# Patient Record
Sex: Male | Born: 1935 | Race: White | Hispanic: No | Smoking: Former smoker
Health system: Southern US, Community
[De-identification: ages and names within clinical notes are randomized; demographics above are authoritative.]

## PROBLEM LIST (undated history)

## (undated) DIAGNOSIS — I509 Heart failure, unspecified: Secondary | ICD-10-CM

## (undated) DIAGNOSIS — N400 Enlarged prostate without lower urinary tract symptoms: Secondary | ICD-10-CM

## (undated) DIAGNOSIS — M1990 Unspecified osteoarthritis, unspecified site: Secondary | ICD-10-CM

## (undated) DIAGNOSIS — F419 Anxiety disorder, unspecified: Secondary | ICD-10-CM

## (undated) DIAGNOSIS — I639 Cerebral infarction, unspecified: Secondary | ICD-10-CM

## (undated) DIAGNOSIS — I10 Essential (primary) hypertension: Secondary | ICD-10-CM

## (undated) DIAGNOSIS — R001 Bradycardia, unspecified: Secondary | ICD-10-CM

## (undated) DIAGNOSIS — I5042 Chronic combined systolic (congestive) and diastolic (congestive) heart failure: Secondary | ICD-10-CM

## (undated) DIAGNOSIS — K219 Gastro-esophageal reflux disease without esophagitis: Secondary | ICD-10-CM

## (undated) HISTORY — DX: Cerebral infarction, unspecified: I63.9

## (undated) HISTORY — DX: Essential (primary) hypertension: I10

## (undated) HISTORY — DX: Anxiety disorder, unspecified: F41.9

## (undated) HISTORY — DX: Chronic combined systolic (congestive) and diastolic (congestive) heart failure: I50.42

## (undated) HISTORY — DX: Benign prostatic hyperplasia without lower urinary tract symptoms: N40.0

---

## 2002-10-13 ENCOUNTER — Ambulatory Visit (HOSPITAL_COMMUNITY): Admission: RE | Admit: 2002-10-13 | Discharge: 2002-10-13 | Payer: Self-pay | Admitting: Gastroenterology

## 2004-12-25 HISTORY — PX: CARDIAC CATHETERIZATION: SHX172

## 2005-01-20 ENCOUNTER — Inpatient Hospital Stay (HOSPITAL_BASED_OUTPATIENT_CLINIC_OR_DEPARTMENT_OTHER): Admission: RE | Admit: 2005-01-20 | Discharge: 2005-01-20 | Payer: Self-pay | Admitting: Cardiology

## 2007-04-22 ENCOUNTER — Emergency Department (HOSPITAL_COMMUNITY): Admission: EM | Admit: 2007-04-22 | Discharge: 2007-04-22 | Payer: Self-pay | Admitting: Emergency Medicine

## 2009-11-02 ENCOUNTER — Ambulatory Visit: Payer: Self-pay | Admitting: Family Medicine

## 2009-11-02 DIAGNOSIS — J019 Acute sinusitis, unspecified: Secondary | ICD-10-CM

## 2009-11-02 DIAGNOSIS — I10 Essential (primary) hypertension: Secondary | ICD-10-CM | POA: Insufficient documentation

## 2009-11-02 DIAGNOSIS — F411 Generalized anxiety disorder: Secondary | ICD-10-CM | POA: Insufficient documentation

## 2009-11-24 ENCOUNTER — Ambulatory Visit: Payer: Self-pay | Admitting: Family Medicine

## 2009-11-24 DIAGNOSIS — J020 Streptococcal pharyngitis: Secondary | ICD-10-CM

## 2010-02-23 ENCOUNTER — Telehealth: Payer: Self-pay | Admitting: Family Medicine

## 2010-07-12 ENCOUNTER — Ambulatory Visit: Payer: Self-pay | Admitting: Family Medicine

## 2010-07-12 DIAGNOSIS — N400 Enlarged prostate without lower urinary tract symptoms: Secondary | ICD-10-CM

## 2010-08-09 ENCOUNTER — Ambulatory Visit: Payer: Self-pay | Admitting: Family Medicine

## 2010-08-16 ENCOUNTER — Ambulatory Visit: Payer: Self-pay | Admitting: Family Medicine

## 2010-08-16 DIAGNOSIS — J309 Allergic rhinitis, unspecified: Secondary | ICD-10-CM | POA: Insufficient documentation

## 2011-01-21 ENCOUNTER — Encounter: Payer: Self-pay | Admitting: Family Medicine

## 2011-01-24 ENCOUNTER — Telehealth: Payer: Self-pay | Admitting: *Deleted

## 2011-01-26 NOTE — Assessment & Plan Note (Signed)
Summary: ?SINUS INF/NJR   Vital Signs:  Patient profile:   77 year old male Height:      68.25 inches Weight:      180 pounds BMI:     27.27 Temp:     97.8 degrees F oral BP sitting:   120 / 80  (left arm) Cuff size:   regular  Vitals Entered By: Kern Reap CMA Duncan Dull) (August 16, 2010 2:17 PM) CC: sinus infection   CC:  sinus infection.  History of Present Illness: Micheal Nicholson is an 77 year old male, nonsmoker, who, says he's had allergy problems.  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 who comes in today with a 3-day flair of his sneezing, head congestion, postnasal drip, and cough.  Review of systems negative  Allergies: No Known Drug Allergies  Past History:  Past medical, surgical, family and social histories (including risk factors) reviewed for relevance to current acute and chronic problems.  Past Medical History: Reviewed history from 08/09/2010 and no changes required. Hypertension Anxiety BPH  Past Surgical History: Reviewed history from 11/24/2009 and no changes required. cardiac catheterization 4 years ago, negative results no abnormalities found  Family History: Reviewed history from 11/02/2009 and no changes required. Father died PNA Mother died "old age" at 34  Social History: Reviewed history from 11/02/2009 and no changes required. 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 No apparent alopecia or balding. Eyes:  No corneal or conjunctival inflammation noted. EOMI. Perrla. Funduscopic exam benign, without hemorrhages, exudates or papilledema. Vision grossly normal. Ears:  External ear exam shows no significant lesions or deformities.  Otoscopic examination reveals  clear canals, tympanic membranes are intact bilaterally without bulging, retraction, inflammation or discharge. Hearing is grossly normal bilaterally. Nose:  septum deviated partially to the left.  3+ nasal edema Mouth:  Oral mucosa and oropharynx without lesions or exudates.  Teeth in good repair. Neck:  No deformities, masses, or tenderness noted. Chest Wall:  No deformities, masses, tenderness or gynecomastia noted. Lungs:  symmetrical breath sounds faint late expiratory wheezing   Problems:  Medical Problems Added: 1)  Dx of Rhinitis  (ICD-477.9)  Impression & Recommendations:  Problem # 1:  RHINITIS (ICD-477.9) Assessment New  The following medications were removed from the medication list:    Omnaris 50 Mcg/act Susp (Ciclesonide) .Marland Kitchen... 2 sprays each nostril once per day as needed  Orders: Prescription Created Electronically [phone])  Complete Medication List: 1)  Lotrel 5-20 Mg Caps (Amlodipine besy-benazepril hcl) .... Once daily 2)  Alprazolam 1 Mg Tabs (Alprazolam) .... One half to one tab b.i.d. 3)  Prednisone 20 Mg Tabs (Prednisone) .... Uad  Patient Instructions: 1)  begin prednisone, take one tablet x 3 days, a half x 3 days, then half a tablet Monday, Wednesday, Friday, for a two week taper.  When she is stopped the prednisone and then resume The plain Claritin 10 mg daily Prescriptions: PREDNISONE 20 MG TABS (PREDNISONE) UAD  #30 x 1   Entered and Authorized by:   Roderick Pee MD   Signed by:   Roderick Pee MD on 08/16/2010   Method used:   [facility] [address]  [address]       [place], [place]  [address]       Ph: [phone] or [phone]       Fax: [phone]   RxID:   [id number]

## 2011-01-26 NOTE — Assessment & Plan Note (Signed)
Summary: 1 month rov/nrj   Vital Signs:  Patient profile:   77 year old male Weight:      179 pounds Temp:     98.0 degrees F oral BP sitting:   130 / 78  (left arm) Cuff size:   regular  Vitals Entered By: Sid Falcon LPN (August 09, 2010 10:39 AM) CC: 1 month follow-up, Hypertension Management   History of Present Illness: Here for the following:  Recent BPH symptoms.  Pt improved greatly with Flomax.  No slow stream and frequency is  improved.  Less nocturia.  No burning with urination.  No side effects from med.  Had some nodularity of prostate on exam but he is not interested in further evaluation at this time given his age.  Hypertension treated with Lotrel.  Compliant with med and no side effects.  Long hx of anxiety.  Has been on alprazolam for years and doing well. No hx of misuse.  No ETOH use.  No depression symptoms. Steady on feet with no recent falls.   Hypertension History:      He denies headache, chest pain, palpitations, dyspnea with exertion, orthopnea, PND, peripheral edema, visual symptoms, neurologic problems, syncope, and side effects from treatment.        Positive major cardiovascular risk factors include male age 84 years old or older and hypertension.  Negative major cardiovascular risk factors include non-tobacco-user status.     Allergies (verified): No Known Drug Allergies  Past History:  Past Surgical History: Last updated: 11/24/2009 cardiac catheterization 4 years ago, negative results no abnormalities found  Family History: Last updated: 11-25-09 Father died PNA Mother died "old age" at 90  Social History: Last updated: 11/25/2009 Retired Never Smoked Alcohol use-no  Risk Factors: Smoking Status: never (2009-11-25)  Past Medical History: Hypertension Anxiety BPH PMH reviewed for relevance, SH/Risk Factors reviewed for relevance  Review of Systems  The patient denies anorexia, fever, weight loss, weight gain, chest  pain, syncope, dyspnea on exertion, peripheral edema, prolonged cough, headaches, hemoptysis, abdominal pain, melena, hematochezia, and severe indigestion/heartburn.    Physical Exam  General:  Well-developed,well-nourished,in no acute distress; alert,appropriate and cooperative throughout examination Head:  Normocephalic and atraumatic without obvious abnormalities. No apparent alopecia or balding. Mouth:  Oral mucosa and oropharynx without lesions or exudates.  Teeth in good repair. Neck:  No deformities, masses, or tenderness noted. Lungs:  Normal respiratory effort, chest expands symmetrically. Lungs are clear to auscultation, no crackles or wheezes. Heart:  Normal rate and regular rhythm. S1 and S2 normal without gallop, murmur, click, rub or other extra sounds. Abdomen:  Bowel sounds positive,abdomen soft and non-tender without masses, organomegaly or hernias noted. Extremities:  No clubbing, cyanosis, edema, or deformity noted with normal full range of motion of all joints.   Neurologic:  alert & oriented X3, cranial nerves II-XII intact, and gait normal.   Psych:  normally interactive, good eye contact, not anxious appearing, and not depressed appearing.     Impression & Recommendations:  Problem # 1:  ESSENTIAL HYPERTENSION (ICD-401.9) Assessment Unchanged  His updated medication list for this problem includes:    Lotrel 5-20 Mg Caps (Amlodipine besy-benazepril hcl) ..... Once daily  Problem # 2:  HYPERTROPHY PROSTATE W/UR OBST & OTH LUTS (ICD-600.01) Assessment: Improved Cont Flomax.  No orthostasis.  Problem # 3:  ANXIETY (ICD-300.00) Assessment: Unchanged  His updated medication list for this problem includes:    Alprazolam 1 Mg Tabs (Alprazolam) ..... One half to one tab b.i.d.  Orders: Prescription Created Electronically [id number])  Complete Medication List: 1)  Lotrel 5-20 Mg Caps (Amlodipine besy-benazepril hcl) .... Once daily 2)  Omnaris 50 Mcg/act Susp  (Ciclesonide) .... 2 sprays each nostril once per day as needed 3)  Alprazolam 1 Mg Tabs (Alprazolam) .... One half to one tab b.i.d. 4)  Tamsulosin Hcl 0.4 Mg Caps (Tamsulosin hcl) .... One by mouth at bedtime  Hypertension Assessment/Plan:      The patient's hypertensive risk group is category B: At least one risk factor (excluding diabetes) with no target organ damage.  Today's blood pressure is 130/78.    Patient Instructions: 1)  Please schedule a follow-up appointment in 6 months .  Prescriptions: ALPRAZOLAM 1 MG TABS (ALPRAZOLAM) one half to one tab b.i.d.  #60 x 5   Entered and Authorized by:   Evelena Peat MD   Signed by:   Evelena Peat MD on 08/09/2010   Method used:   Print then Give to Patient   RxID:   [id number]

## 2011-01-26 NOTE — Assessment & Plan Note (Signed)
Summary: Problems with prostate/cjr   Vital Signs:  Patient profile:   77 year old male Weight:      175 pounds Temp:     98.0 degrees F oral BP sitting:   140 / 100  (left arm) Cuff size:   regular  Vitals Entered By: Sid Falcon LPN (July 12, 2010 9:24 AM)  Serial Vital Signs/Assessments:  Time      Position  BP       Pulse  Resp  Temp     By                     132/78                         Evelena Peat MD  CC: Hypertension Management   History of Present Illness: Onset of urinary difficulties with slow stream for several months. No burning.  No signif nocturia.  Freq has to wait 30-40 sec for flow. No hx prostate cancer.  No appetite or weight changes. No anticholinergics.  On Lotrel for hypertension.  Compliant with therapy.    Hypertension History:      He denies headache, chest pain, palpitations, dyspnea with exertion, orthopnea, PND, peripheral edema, visual symptoms, neurologic problems, and syncope.        Positive major cardiovascular risk factors include male age 23 years old or older and hypertension.  Negative major cardiovascular risk factors include non-tobacco-user status.     Allergies (verified): No Known Drug Allergies  Past History:  Past Medical History: Last updated: 11/02/2009 Hypertension Anxiety PMH reviewed for relevance  Review of Systems  The patient denies anorexia, fever, weight loss, hoarseness, chest pain, syncope, dyspnea on exertion, peripheral edema, prolonged cough, abdominal pain, melena, hematochezia, and muscle weakness.    Physical Exam  General:  Well-developed,well-nourished,in no acute distress; alert,appropriate and cooperative throughout examination Mouth:  Oral mucosa and oropharynx without lesions or exudates.  Teeth in good repair. Neck:  No deformities, masses, or tenderness noted. Lungs:  Normal respiratory effort, chest expands symmetrically. Lungs are clear to auscultation, no crackles or  wheezes. Heart:  normal rate and regular rhythm.   Prostate:  mildly enlarged.  Does have some nodularity near the midline. Extremities:  no edema. Psych:  normally interactive, good eye contact, not anxious appearing, and not depressed appearing.     Impression & Recommendations:  Problem # 1:  HYPERTROPHY PROSTATE W/UR OBST & OTH LUTS (ICD-600.01) Assessment New discussed with pt options.  He has some BPH and could have some cancer based on exam but given his age we have not recomended further testing for prostate cancer and pt agrees.  Start Flomax for symptom control.  Problem # 2:  ESSENTIAL HYPERTENSION (ICD-401.9) Assessment: Unchanged  His updated medication list for this problem includes:    Lotrel 5-20 Mg Caps (Amlodipine besy-benazepril hcl) ..... Once daily  Complete Medication List: 1)  Lotrel 5-20 Mg Caps (Amlodipine besy-benazepril hcl) .... Once daily 2)  Omnaris 50 Mcg/act Susp (Ciclesonide) .... 2 sprays each nostril once per day as needed 3)  Alprazolam 1 Mg Tabs (Alprazolam) .... One half to one tab b.i.d. 4)  Tamsulosin Hcl 0.4 Mg Caps (Tamsulosin hcl) .... One by mouth at bedtime  Hypertension Assessment/Plan:      The patient's hypertensive risk group is category B: At least one risk factor (excluding diabetes) with no target organ damage.  Today's blood pressure is 140/100.  Patient Instructions: 1)  Please schedule a follow-up appointment in 1 month.  Prescriptions: LOTREL 5-20 MG CAPS (AMLODIPINE BESY-BENAZEPRIL HCL) once daily  #30 x 11   Entered and Authorized by:   Evelena Peat MD   Signed by:   Evelena Peat MD on 07/12/2010   Method used:   [facility]  [address]       [address]       [place], [place]  [address]       Ph: [phone] or [phone]       Fax: [phone]   RxID:   [id number] TAMSULOSIN HCL 0.4 MG CAPS (TAMSULOSIN HCL) one by mouth at bedtime  #30 x 11   Entered and Authorized by:    Evelena Peat MD   Signed by:   Evelena Peat MD on 07/12/2010   Method used:   [facility]  [address] [facility]       [address]       [place], [place]  [address]       Ph: [phone] or [phone]       Fax: [phone]   RxID:   [id number]

## 2011-01-26 NOTE — Progress Notes (Signed)
Summary: LMTCB 3/2,3/3, Amoxicillin refill request  Phone Note From Pharmacy   [facility]  [address] [facility] [place] Call For: Aubrionna Istre  Summary of Call: Pharmacy faxed request to refill Amoxicillin 875, one by mouth, two times a day for 10 days for sinusitis.   Pt received Rx at [facility] on 12/1 [facility] with Dr Scotty Court #30 with 1 RF. [facility]  I called pt home to gather additional information regarding current symptoms, no answer, LMTCB  Initial call taken by: Sid Falcon LPN,  February 23, 2010 4:28 PM  Follow-up for Phone Call        [facility] LPN  February 24, 1609 8:42 AM   Additional Follow-up for Phone Call Additional follow up Details #1::        Pt returned call..... Pt adv that he has been exp sinus problems... one nostril feels like it swells up and limits his breathing.... Pt denies any fever, cough, or congestion, only c/o stuffy nose / stopped up sinus'...... Pt adv he can be reached at [phone].  Additional Follow-up by: Debbra Riding,  February 24, 2010 10:02 AM    Additional Follow-up for Phone Call Additional follow up Details #2::    should be re-evaluated.    Follow-up by: Evelena Peat MD,  February 28, 2010 12:55 PM  Additional Follow-up for Phone Call Additional follow up Details #3:: Details for Additional Follow-up Action Taken: Phone Call Completed-----LMTCB to schedule appt with Dr. Caryl Never to f/u on current sxs.  Additional Follow-up by: Debbra Riding,  February 28, 2010 1:11 PM

## 2011-02-07 ENCOUNTER — Encounter: Payer: Self-pay | Admitting: Family Medicine

## 2011-02-07 ENCOUNTER — Ambulatory Visit (INDEPENDENT_AMBULATORY_CARE_PROVIDER_SITE_OTHER): Payer: Medicare PPO | Admitting: Family Medicine

## 2011-02-07 VITALS — BP 140/84 | HR 72 | Temp 98.3°F | Resp 12 | Ht 69.0 in | Wt 182.0 lb

## 2011-02-07 DIAGNOSIS — F419 Anxiety disorder, unspecified: Secondary | ICD-10-CM

## 2011-02-07 DIAGNOSIS — N401 Enlarged prostate with lower urinary tract symptoms: Secondary | ICD-10-CM

## 2011-02-07 DIAGNOSIS — I10 Essential (primary) hypertension: Secondary | ICD-10-CM

## 2011-02-07 DIAGNOSIS — F411 Generalized anxiety disorder: Secondary | ICD-10-CM

## 2011-02-07 DIAGNOSIS — N400 Enlarged prostate without lower urinary tract symptoms: Secondary | ICD-10-CM

## 2011-02-07 DIAGNOSIS — K219 Gastro-esophageal reflux disease without esophagitis: Secondary | ICD-10-CM | POA: Insufficient documentation

## 2011-02-07 MED ORDER — ALPRAZOLAM 1 MG PO TABS: 1.0000 mg | ORAL_TABLET | Freq: Two times a day (BID) | ORAL | Status: DC

## 2011-02-07 MED ORDER — OMEPRAZOLE 20 MG PO CPDR: 20.0000 mg | DELAYED_RELEASE_CAPSULE | Freq: Every day | ORAL | Status: DC

## 2011-02-07 MED ORDER — DUTASTERIDE-TAMSULOSIN HCL 0.5-0.4 MG PO CAPS: 1.0000 | ORAL_CAPSULE | Freq: Every day | ORAL | Status: DC

## 2011-02-07 NOTE — Progress Notes (Signed)
  Subjective:    Patient ID: Micheal Nicholson, male    DOB: 06-Mar-1920, 77 y.o.   MRN: [id number]  HPI  Patient seen for the following  History of BPH. Started on Flomax several months ago without any improvement. Symptoms seem to be worse at night. Mostly complains of slow urinary stream. No burning with urination. Patient has not been tried on Avodart or finasteride previously.    history of chronic rhinitis symptoms. They seem to correlate with increased GERD symptoms. Frequent almost daily heartburn symptoms midepigastrium to upper sternal region. Worse supine. Has tried over-the-counter antacids with minimal relief. No pain with swallowing. No dysphagia.    hypertension treated with Lotrel. Compliant with medication. Denies any dizziness or side effects.  Chronic history of anxiety. Has been on alprazolam for many years. No recent falls. Requesting refills. No alcohol use.   Review of Systems  patient denies any headaches, dizziness, shortness of breath, chest pain, abdominal pain, burning with urination, stool changes , depression , or any significant joint pains    Objective:   Physical Exam  patient is alert and in no distress  Oropharynx is clear  Eardrums normal  Neck no mass  Chest clear to auscultation  Heart regular rhythm and rate  Abdomen soft and nontender  Extremities no edema  Psychiatric patient alert oriented x3. Normal mood and affect       Assessment & Plan:   #1 BPH. Discontinue Flomax and start Jalyn  #2 hypertension stable continue current medication with refills given   #3 chronic anxiety refill of alprazolam for 6 months  #4 chronic rhinitis possibly exacerbated by GERD symptoms. Start omeprazole 20 mg daily and reassess 2 months

## 2011-04-11 ENCOUNTER — Ambulatory Visit (INDEPENDENT_AMBULATORY_CARE_PROVIDER_SITE_OTHER): Payer: Medicare PPO | Admitting: Family Medicine

## 2011-04-11 ENCOUNTER — Encounter: Payer: Self-pay | Admitting: Family Medicine

## 2011-04-11 DIAGNOSIS — N401 Enlarged prostate with lower urinary tract symptoms: Secondary | ICD-10-CM

## 2011-04-11 DIAGNOSIS — I10 Essential (primary) hypertension: Secondary | ICD-10-CM

## 2011-04-11 DIAGNOSIS — K219 Gastro-esophageal reflux disease without esophagitis: Secondary | ICD-10-CM

## 2011-04-11 DIAGNOSIS — M25539 Pain in unspecified wrist: Secondary | ICD-10-CM

## 2011-04-11 NOTE — Progress Notes (Signed)
  Subjective:    Patient ID: Micheal Nicholson, male    DOB: 1920-04-29, 77 y.o.   MRN: [id number]  HPI Followup for chronic problems and new problem  BPH. Started [place] last visit. He had some decrease in nocturia and still has some slow stream but overall improving gradually. No major obstructive symptoms. No side effects from medication. Compliant with therapy.  Hypertension stable on Lotrel. No dizziness. Blood pressure been stable.  Recent GERD symptoms. Started omeprazole. Symptoms are fully resolved and he is currently off medication doing well.  He has made some diet modifications.  Left wrist pain for one to two-month duration. No injury. Frequently plays golf. Pain is mostly dorsal. No swelling. He thinks is arthritis related. No erythema or warmth. Not limiting activities   Review of Systems  Constitutional: Negative for fever, chills, activity change, appetite change and fatigue.  Respiratory: Negative for cough, shortness of breath and wheezing.   Cardiovascular: Negative for chest pain, palpitations and leg swelling.  Gastrointestinal: Negative for nausea, vomiting, abdominal pain, diarrhea, constipation, blood in stool and abdominal distention.  Genitourinary: Negative for dysuria.  Musculoskeletal: Negative for joint swelling.  Skin: Negative for rash.  Neurological: Negative for dizziness, syncope, weakness and headaches.  Hematological: Negative for adenopathy. Does not bruise/bleed easily.  Psychiatric/Behavioral: Negative for confusion.       Objective:   Physical Exam  Constitutional: He is oriented to person, place, and time. He appears well-developed and well-nourished.  HENT:  Mouth/Throat: Oropharynx is clear and moist. No oropharyngeal exudate.  Neck: No thyromegaly present.  Cardiovascular: Normal rate, regular rhythm and normal heart sounds.   No murmur heard. Pulmonary/Chest: Effort normal and breath sounds normal. No respiratory distress. He has no  wheezes. He has no rales.  Musculoskeletal: He exhibits no edema.       Left wrist - full range of motion. No warmth or erythema. No visible edema. No localized bony tenderness.  Lymphadenopathy:    He has no cervical adenopathy.  Neurological: He is alert and oriented to person, place, and time. No cranial nerve deficit.  Psychiatric: He has a normal mood and affect.          Assessment & Plan:  #1 BPH improved with medication. Continue with current therapy #2 hypertension stable.   continue current therapy  #3 GERD symptomatically improved off omeprazole #4 left wrist pain. Suspect osteoarthritis. He'll use brace as needed for symptom relief no longer than 2 weeks continuously. Tylenol for discomfort

## 2011-05-12 NOTE — H&P (Signed)
Micheal Nicholson, Micheal Nicholson NO.:  [id number]   MEDICAL RECORD NO.:  [id number]          PATIENT TYPE:  AMB   LOCATION:                               FACILITY:  [facility]   PHYSICIAN:  Peter M. Swaziland, M.D.  DATE OF BIRTH:  [birth year]   DATE OF ADMISSION:  01/20/2005  DATE OF DISCHARGE:                                HISTORY & PHYSICAL   HISTORY OF PRESENT ILLNESS:  [name] is a 77 year old white male who was  seen for abnormal ECG and PVCs.  In general, the patient has been in good  health.  He does have a history of hypertension and has been on Lotrel for a  number of years.  He states he has had skipping in his heart beat for over  20 years but does note that the amount of skipping has increased recently.  He otherwise denies any dyspnea, syncope or chest pain.  On recent  evaluation, ECG was obtained which showed frequent PVCs and evidence of  possible old inferior infarction which was new compared to 31.  He  subsequently underwent a stress Cardiolite study on 01/16/05.  He was able to  exercise for six minutes on the Bruce protocol without chest pain or  dyspnea.  He did have very frequent PVCs and PVC couplets, as well as a four  beat run of nonsustained ventricular tachycardia.  There are no diagnostic  ST changes.  Subsequent Cardiolite images demonstrate an abnormally fixed  inferior wall defect.  The left ventricular function was mildly depressed,  with injection fraction of 49%.  Based on his abnormal Cardiolite study, we  have recommended further evaluation with coronary angiography.   PAST MEDICAL HISTORY:  Hypertension.  He has no known history of diabetes or  hypercholesterolemia.  He has had no prior surgery.   ALLERGIES:  No known drug allergies.   CURRENT MEDICATIONS:  Lotrel 5/20 mg per day.   SOCIAL HISTORY:  The patient is retired from [employer].  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  He exercises regularly and enjoys playing golf.  He is  divorced  and has two children.  One son previously died with pancreatic  cancer.  He denies tobacco use and drinks occasional brandy.   FAMILY HISTORY:  Father died at age 41 with pneumonia.  Mother died at age  32 of pneumonia.  One brother died with lung cancer.  Two sisters died with  unknown causes.  One brother is alive and well.   REVIEW OF SYSTEMS:  He has no claudication, no history of transient ischemic  attack or stroke, no recent bowel or bladder complaints.  All other Review  of Systems were negative.   PHYSICAL EXAMINATION:  The patient is a pleasant white male in no apparent  distress.  Weight 182, blood pressure 140/90, pulse 82, frequent  extrasystoles.  Respirations were normal.  HEENT examination was  normocephalic and atraumatic.  Pupils equal round and reactive to light and  accommodation; sclerae are anicteric.  Oropharynx is clear.  Neck without  JVD, adenopathy, thyromegaly or bruits.  Lungs:  Clear to auscultation and  percussion.  Cardiac examination reveals a regular rate and rhythm without  gallop, murmur, rub or click.  Abdomen is soft and nontender.  No masses or  bruits.  Extremities are without edema.  Pulses are 2+ and symmetric.  Neurological examination was nonfocal.   LABORATORY DATA:  ECG shows sinus rhythm with frequent PVCs.  Compared to  1992, there is increased QRS widely left axis deviation with possible  inferior infarction.  Chest x-ray shows no active disease.   IMPRESSION:  1.  Abnormal stress Cardiolite studies suggesting possible inferior      infarction and/or ischemia.  2. PVCs.  3. Hypertension.   PLAN:  We will proceed with coronary angiography.       ___________________________________________  Peter M. Swaziland, M.D.    PMJ/MEDQ  D:  01/18/2005  T:  01/18/2005  Job:  [id number]   cc:   Evelena Peat, M.D.  [address]  [place]  [place] [address]  Fax: [phone]

## 2011-05-12 NOTE — Op Note (Signed)
   Micheal Nicholson, Micheal Nicholson                         ACCOUNT NO.:  [id number]   MEDICAL RECORD NO.:  [id number]                   PATIENT TYPE:  AMB   LOCATION:  ENDO                                 FACILITY:  [facility]   PHYSICIAN:  James L. Malon Kindle., M.D.          DATE OF BIRTH:  1920-10-31   DATE OF PROCEDURE:  10/13/2002  DATE OF DISCHARGE:                                 OPERATIVE REPORT   PROCEDURE PERFORMED:  Colonoscopy.   MEDICATIONS:  Fentanyl 70 mcg Versed 7 mg   ENDOSCOPE:  Olympus adult video endoscope   INDICATIONS FOR PROCEDURE:  Hemoccult positive stool.   DESCRIPTION OF PROCEDURE:  The procedure had been explained to the patient  and consent obtained.  With the patient in the left lateral decubitus  position, the Olympus adult video colonoscope was inserted, following a  digital exam and advancement under direct visualization.  The prep was  excellent.  We were able to advance to the cecum without difficulty.  The  ileocecal valve and appendiceal orifice were seen.  The area was irrigated,  carefully examined.  No polyps or AVMs were seen.  The cecum, ascending  colon, hepatic flexure, transverse colon, splenic flexure, descending and  sigmoid colon were seen well without any polyps or other lesions seen.  No  significant diverticular disease.  The scope was withdrawn down into the  rectum, the rectum was free of polyps.  There were small internal  hemorrhoids.   ASSESSMENT:  Hemoccult positive stool with no significant findings other  than small internal hemorrhoids on colonoscopy.   PLAN:  Will see back in the office in the next two to three months to  recheck stool.                                                 James L. Malon Kindle., M.D.    Waldron Session  D:  10/13/2002  T:  10/13/2002  Job:  [id number]   cc:   Gwen Pounds, M.D.

## 2011-05-12 NOTE — Cardiovascular Report (Signed)
Micheal Nicholson, Micheal Nicholson NO.:  [id number]   MEDICAL RECORD NO.:  [id number]          PATIENT TYPE:  OIB   LOCATION:  6501                         FACILITY:  [facility]   PHYSICIAN:  Peter M. [place], M.D.  DATE OF BIRTH:  1920/02/16   DATE OF PROCEDURE:  01/20/2005  DATE OF DISCHARGE:                              CARDIAC CATHETERIZATION   INDICATIONS FOR PROCEDURE:  An 77 year old white male with frequent PVCs.  Recent stress Cardiolite studies suggest inferior wall scar with mildly  depressed left ventricular function. The patient has a history of  hypertension.   PROCEDURE:  Left heart catheterization, coronary and left ventricular  angiography.   EQUIPMENT:  4-French 4 cm right and left Judkins catheter, 4-French pigtail  catheter, 4-French arterial sheath.   ACCESS:  Right femoral artery using standard Seldinger technique.   MEDICATIONS:  Local anesthesia 1% Xylocaine.   CONTRAST:  90 mL of Omnipaque.   HEMODYNAMIC DATA:  Aortic pressure was 140/70 with a mean of 99 mmHg. Left  ventricle pressure was 142 with EDP of 16 mmHg.   ANGIOGRAPHIC DATA:  The left coronary artery arises and distributes  normally. The left main coronary is normal.   Left anterior descending artery has 30% narrowing following the takeoff of  the first septal perforator and first diagonal branch. The remainder of the  vessel is normal.   Left circumflex coronary gives rise to two marginal branches and is normal.   The right coronary is a large dominant vessel and is normal.   The left ventricular angiography was performed in RAO view. This  demonstrates mild global hypokinesia with overall mildly impaired left  ventricular systolic function. Ejection fraction is estimated 45%. There is  no mitral regurgitation or prolapse.   FINAL INTERPRETATION:  1.  Mild nonobstructive atherosclerotic coronary artery disease.  2.  Mildly impaired left ventricular systolic function.   PLAN:   Recommend continued medical therapy.      PMJ/MEDQ  D:  01/20/2005  T:  01/20/2005  Job:  [id number]   cc:   Evelena Peat, M.D.  [address]  [place]  [place] [address]  Fax: [phone]

## 2011-07-31 ENCOUNTER — Other Ambulatory Visit: Payer: Self-pay | Admitting: Family Medicine

## 2011-09-08 ENCOUNTER — Ambulatory Visit (INDEPENDENT_AMBULATORY_CARE_PROVIDER_SITE_OTHER): Payer: Medicare PPO | Admitting: Family Medicine

## 2011-09-08 ENCOUNTER — Encounter: Payer: Self-pay | Admitting: Family Medicine

## 2011-09-08 DIAGNOSIS — J019 Acute sinusitis, unspecified: Secondary | ICD-10-CM

## 2011-09-08 MED ORDER — AMOXICILLIN-POT CLAVULANATE 875-125 MG PO TABS: 1.0000 | ORAL_TABLET | Freq: Two times a day (BID) | ORAL | Status: AC

## 2011-09-08 NOTE — Progress Notes (Signed)
  Subjective:    Patient ID: Micheal Nicholson, male    DOB: 01/13/1920, 77 y.o.   MRN: [id number]  HPI Over one week history of progressive sinus congestion. Left maxillary facial pain. No headaches. Occasional cough. Yellowish to greenish nasal discharge intermittently. Denies sore throat. Has tried occasional saline irrigation without much improvement. Patient has no known drug allergies. Remote history of smoking   Review of Systems  Constitutional: Negative for fever and chills.  HENT: Positive for congestion and sinus pressure. Negative for sore throat.   Respiratory: Negative for cough and shortness of breath.   Cardiovascular: Negative for chest pain.  Neurological: Negative for dizziness and headaches.       Objective:   Physical Exam  Constitutional: He appears well-developed and well-nourished.  HENT:  Right Ear: External ear normal.  Left Ear: External ear normal.  Mouth/Throat: Oropharynx is clear and moist.       No nasal polyps.  Neck: Neck supple.  Cardiovascular: Normal rate and regular rhythm.   Pulmonary/Chest: Effort normal and breath sounds normal. No respiratory distress. He has no wheezes. He has no rales.  Lymphadenopathy:    He has no cervical adenopathy.          Assessment & Plan:  Probable acute sinusitis. Augmentin 875 mg twice daily for 10 days. Consider nasal irrigation with Netti pot.

## 2011-09-08 NOTE — Patient Instructions (Signed)
Consider Netti pot for nasal irrigation.

## 2011-10-11 ENCOUNTER — Encounter: Payer: Self-pay | Admitting: Family Medicine

## 2011-10-11 ENCOUNTER — Ambulatory Visit (INDEPENDENT_AMBULATORY_CARE_PROVIDER_SITE_OTHER): Payer: Medicare PPO | Admitting: Family Medicine

## 2011-10-11 VITALS — BP 130/82 | Temp 97.8°F | Wt 178.0 lb

## 2011-10-11 DIAGNOSIS — J019 Acute sinusitis, unspecified: Secondary | ICD-10-CM

## 2011-10-11 DIAGNOSIS — Z23 Encounter for immunization: Secondary | ICD-10-CM

## 2011-10-11 DIAGNOSIS — F419 Anxiety disorder, unspecified: Secondary | ICD-10-CM

## 2011-10-11 DIAGNOSIS — F411 Generalized anxiety disorder: Secondary | ICD-10-CM

## 2011-10-11 DIAGNOSIS — I10 Essential (primary) hypertension: Secondary | ICD-10-CM

## 2011-10-11 MED ORDER — ALPRAZOLAM 1 MG PO TABS: 1.0000 mg | ORAL_TABLET | Freq: Two times a day (BID) | ORAL | Status: DC

## 2011-10-11 MED ORDER — FLUTICASONE PROPIONATE 50 MCG/ACT NA SUSP: 1.0000 | Freq: Every day | NASAL | Status: DC

## 2011-10-11 MED ORDER — AMOXICILLIN-POT CLAVULANATE 875-125 MG PO TABS: 1.0000 | ORAL_TABLET | Freq: Two times a day (BID) | ORAL | Status: AC

## 2011-10-11 NOTE — Progress Notes (Signed)
  Subjective:    Patient ID: Lemont Fillers, male    DOB: 11/03/1920, 77 y.o.   MRN: [id number]  HPI Here for multiple issues as follows  Recurrent left facial pain and left maxillary sinus congestion. Recently on antibiotics and felt better while taking medication then worsening symptoms after stopping. No fever. Minimal drainage. Increased malaise. Mostly clear drainage from right side but occasional yellow tinged mucous left side.  History of BPH. Improved with medication. Overall symptoms improved but still occasional slow stream. No burning with urination. No significant nocturia.  Hypertension treated with Lotrel. Compliant with therapy. No orthostasis. Patient denies any shortness of breath or chest pains.  Chronic anxiety. Takes alprazolam 1 mg one half to one tablet twice daily. No history of falls. No history of misuse. No regular alcohol use.  Past Medical History  Diagnosis Date  . Hypertension   . Anxiety   . BPH (benign prostatic hyperplasia)    Past Surgical History  Procedure Date  . Cardiac catheterization     Negative / 4 years ago    reports that he has quit smoking. He does not have any smokeless tobacco history on file. He reports that he does not drink alcohol or use illicit drugs. family history is not on file. No Known Allergies    Review of Systems  Constitutional: Positive for fatigue. Negative for fever, chills and unexpected weight change.  HENT: Positive for congestion and sinus pressure. Negative for ear pain, sore throat, trouble swallowing and voice change.   Respiratory: Negative for cough and shortness of breath.   Cardiovascular: Negative for chest pain, palpitations and leg swelling.  Neurological: Negative for dizziness, syncope and headaches.  Psychiatric/Behavioral: Negative for dysphoric mood.       Objective:   Physical Exam  Constitutional: He is oriented to person, place, and time. He appears well-developed and well-nourished.    HENT:  Right Ear: External ear normal.  Left Ear: External ear normal.  Mouth/Throat: Oropharynx is clear and moist.       Nonspecific turbinate swelling both nares  Neck: Neck supple.  Cardiovascular: Normal rate and regular rhythm.   Pulmonary/Chest: Effort normal and breath sounds normal. No respiratory distress. He has no wheezes. He has no rales.  Musculoskeletal: He exhibits no edema.  Lymphadenopathy:    He has no cervical adenopathy.  Neurological: He is alert and oriented to person, place, and time. No cranial nerve deficit.  Psychiatric: He has a normal mood and affect.          Assessment & Plan:  #1 recurrent versus chronic left maxillary sinusitis. Augmentin 875 mg twice daily for 10 days. If symptoms not fully cleared in 2 weeks Limited CT sinuses. Flonase nasal one spray per nostril twice daily  #2 chronic anxiety. Refill alprazolam. #3 history of BPH stable continue current medication #4 hypertension at goal. Continue Lotrel current dosage. #5 health maintenance. Flu vaccine given. Patient has no contraindications.

## 2011-10-11 NOTE — Patient Instructions (Signed)
Call if sinus symptoms not better in 2 weeks.

## 2011-12-07 ENCOUNTER — Telehealth: Payer: Self-pay | Admitting: Family Medicine

## 2011-12-07 NOTE — Telephone Encounter (Signed)
Pt just saw chiropractor and was suggested to get lab work done by his pcp. Pt was not certain what lab work was needed he said it may have to do with kidneys. Please contact

## 2011-12-08 ENCOUNTER — Ambulatory Visit (INDEPENDENT_AMBULATORY_CARE_PROVIDER_SITE_OTHER): Payer: Medicare PPO | Admitting: Family Medicine

## 2011-12-08 ENCOUNTER — Encounter: Payer: Self-pay | Admitting: Family Medicine

## 2011-12-08 DIAGNOSIS — R1031 Right lower quadrant pain: Secondary | ICD-10-CM | POA: Insufficient documentation

## 2011-12-08 NOTE — Telephone Encounter (Signed)
Please schedule OV for pt, Thanks

## 2011-12-08 NOTE — Telephone Encounter (Signed)
Lmom for pt to call back and schedule.

## 2011-12-08 NOTE — Progress Notes (Signed)
  Subjective:    Patient ID: Micheal Nicholson, male    DOB: October 13, 1920, 77 y.o.   MRN: [id number]  HPI Taksh is an 77 year old male, who comes in today for evaluation of right lower quadrant abdominal pain for one week.  He states a week ago.  He was cleaning out the refrigerator and was stretching and noticed some right lower quadrant abdominal pain.  He states he goes to his chiropractor frequently.  He went to his chiropractor, who did an x-ray and told him he needed a medical evaluation.  He did not bring the x-ray with him and we have no radiology report.  He points to the right lower quadrant of the source of his discomfort.  He has no fever, chills, nausea, vomiting, diarrhea, or urinary tract symptoms.  Gait is normal.  No pain in the hip.   Review of Systems    General and GI review of systems negative Objective:   Physical Exam  Well-developed well-nourished man in no acute distress.  Examination the abdomen is soft.  Bowel sounds are normal.  There is no palpable abnormalities or tenderness.  He says the area that sore is in the right lower quadrant, but no palpable tenderness      Assessment & Plan:  Right lower quadrant pain, etiology unknown.  Plan asked him to get the x-rays in the radiology report from the chiropractor and come back and see Dr. Caryl Never on Monday for evaluation

## 2011-12-08 NOTE — Patient Instructions (Signed)
Go to the chiropractor's office now and get the x-rays and the radiology report.  Return on Monday to see Dr. Caryl Never

## 2011-12-11 ENCOUNTER — Ambulatory Visit (INDEPENDENT_AMBULATORY_CARE_PROVIDER_SITE_OTHER): Payer: Medicare PPO | Admitting: Family Medicine

## 2011-12-11 ENCOUNTER — Encounter: Payer: Self-pay | Admitting: Family Medicine

## 2011-12-11 DIAGNOSIS — J309 Allergic rhinitis, unspecified: Secondary | ICD-10-CM | POA: Insufficient documentation

## 2011-12-11 DIAGNOSIS — N401 Enlarged prostate with lower urinary tract symptoms: Secondary | ICD-10-CM

## 2011-12-11 DIAGNOSIS — R1031 Right lower quadrant pain: Secondary | ICD-10-CM

## 2011-12-11 MED ORDER — FLUTICASONE PROPIONATE 50 MCG/ACT NA SUSP: 1.0000 | Freq: Every day | NASAL | Status: DC

## 2011-12-11 NOTE — Patient Instructions (Signed)
Touch base in 2 weeks if pain not resolved.

## 2011-12-11 NOTE — Progress Notes (Signed)
  Subjective:    Patient ID: Micheal Nicholson, male    DOB: 02-01-1920, 77 y.o.   MRN: [id number]  HPI  Followup. Recent right lower quadrant abdominal pain/right iliac crest pain. Possible strain. [place] to [place] Brings in films today which are basically looks like a KUB. He has evidence for scoliosis. No acute findings. X-rays reviewed.  Pain is actually somewhat improved today. Minimal pain with position change. No fever or chills. No dysuria. No change in bowel habits. Symptoms not limiting activity at this time. No alleviating factors.  Allergic rhinitis. Requesting refills Flonase. History of BPH treated with Jalyn.  Still has slow stream at times.  No burning with urination.  Symptoms tend to improve through the day.  Past Medical History  Diagnosis Date  . Hypertension   . Anxiety   . BPH (benign prostatic hyperplasia)    Past Surgical History  Procedure Date  . Cardiac catheterization     Negative / 4 years ago    reports that he has quit smoking. He does not have any smokeless tobacco history on file. He reports that he does not drink alcohol or use illicit drugs. family history is not on file. No Known Allergies    Review of Systems  Constitutional: Negative for fever, appetite change and unexpected weight change.  Respiratory: Negative for shortness of breath.   Cardiovascular: Negative for chest pain.  Gastrointestinal: Negative for nausea, vomiting, diarrhea, constipation and blood in stool.  Genitourinary: Negative for dysuria and hematuria.  Skin: Negative for rash.  Neurological: Negative for dizziness.       Objective:   Physical Exam  Constitutional: He appears well-developed and well-nourished.  HENT:  Right Ear: External ear normal.  Left Ear: External ear normal.  Neck: Neck supple. No thyromegaly present.  Cardiovascular: Normal rate and regular rhythm.   Pulmonary/Chest: Effort normal and breath sounds normal. No respiratory distress. He has no  wheezes. He has no rales.  Abdominal: Soft. Bowel sounds are normal. He exhibits no distension and no mass. There is no tenderness. There is no rebound and no guarding.  Musculoskeletal: He exhibits no edema.       Full range of motion right hip. No lateral hip tenderness  Lymphadenopathy:    He has no cervical adenopathy.          Assessment & Plan:  #1 right iliac crest pain. Nonfocal exam and symptoms improving. Questionable recent oblique strain. Observe for now #2 allergic rhinitis. Refill Flonase  #3 BPH. Continue with Jalyn.

## 2011-12-19 ENCOUNTER — Emergency Department (HOSPITAL_COMMUNITY): Payer: Medicare PPO

## 2011-12-19 ENCOUNTER — Emergency Department (HOSPITAL_COMMUNITY)
Admission: EM | Admit: 2011-12-19 | Discharge: 2011-12-19 | Disposition: A | Discharge status: Home / Self Care | Payer: Medicare PPO | Attending: Emergency Medicine | Admitting: Emergency Medicine

## 2011-12-19 ENCOUNTER — Encounter (HOSPITAL_COMMUNITY): Payer: Self-pay | Admitting: Adult Health

## 2011-12-19 DIAGNOSIS — I517 Cardiomegaly: Secondary | ICD-10-CM | POA: Insufficient documentation

## 2011-12-19 DIAGNOSIS — R05 Cough: Secondary | ICD-10-CM | POA: Insufficient documentation

## 2011-12-19 DIAGNOSIS — R0989 Other specified symptoms and signs involving the circulatory and respiratory systems: Secondary | ICD-10-CM | POA: Insufficient documentation

## 2011-12-19 DIAGNOSIS — R059 Cough, unspecified: Secondary | ICD-10-CM | POA: Insufficient documentation

## 2011-12-19 MED ORDER — ALBUTEROL SULFATE HFA 108 (90 BASE) MCG/ACT IN AERS
2.0000 | INHALATION_SPRAY | RESPIRATORY_TRACT | Status: DC | PRN
  Administered 2011-12-19: 2 via RESPIRATORY_TRACT
  Filled 2011-12-19: qty 6.7

## 2011-12-19 MED ORDER — ALBUTEROL SULFATE (5 MG/ML) 0.5% IN NEBU
5.0000 mg | INHALATION_SOLUTION | Freq: Once | RESPIRATORY_TRACT | Status: AC
  Administered 2011-12-19: 5 mg via RESPIRATORY_TRACT
  Filled 2011-12-19: qty 1

## 2011-12-19 NOTE — ED Notes (Signed)
Respiratory here to do breathing treament.

## 2011-12-19 NOTE — ED Notes (Signed)
Respiratory therapy called to administer breathing treatment.

## 2011-12-19 NOTE — ED Notes (Signed)
Chest congestion x 4 days, physician recommended Mucinex which did help initially. Now feels like "something" there I cannot cough up.

## 2011-12-19 NOTE — ED Notes (Signed)
Patient transported to X-ray 

## 2011-12-19 NOTE — ED Provider Notes (Signed)
Medical screening examination/treatment/procedure(s) were conducted as a shared visit with non-physician practitioner(s) and myself.  I personally evaluated the patient during the encounter Pt complains. Of cough.  pe lungs clear.  Benny Lennert, MD 12/19/11 [phone]

## 2011-12-19 NOTE — ED Notes (Signed)
Presents with congestion and cough x3 days. Started mucinex with no relief. Denies Chest pain and SOB. Cough is non productive. States he feels the congestion at the base of his throat. Bilateral lung sounds clear. Pt speaking in full sentences.

## 2011-12-20 ENCOUNTER — Encounter: Payer: Self-pay | Admitting: Internal Medicine

## 2011-12-20 ENCOUNTER — Ambulatory Visit (INDEPENDENT_AMBULATORY_CARE_PROVIDER_SITE_OTHER): Payer: Medicare PPO | Admitting: Internal Medicine

## 2011-12-20 VITALS — BP 118/64 | HR 81 | Temp 98.8°F | Wt 184.0 lb

## 2011-12-20 DIAGNOSIS — J45901 Unspecified asthma with (acute) exacerbation: Secondary | ICD-10-CM

## 2011-12-20 DIAGNOSIS — R05 Cough: Secondary | ICD-10-CM

## 2011-12-20 DIAGNOSIS — J45909 Unspecified asthma, uncomplicated: Secondary | ICD-10-CM | POA: Insufficient documentation

## 2011-12-20 MED ORDER — PREDNISONE 20 MG PO TABS: ORAL_TABLET | ORAL | Status: DC

## 2011-12-20 NOTE — Progress Notes (Signed)
  Subjective:    Patient ID: Micheal Nicholson, male    DOB: 21-Jan-1920, 77 y.o.   MRN: [id number]  HPI Patient comes in today for SDA  For acute problem evaluation. FU ED As was told to come in after being seen in ed yesterday for "cough and chest congestion"  Onset 4 days ago and onset with cough and chest tightness; used  mucinex but " hard to break loose" in chest and was  Taken but son to be seen in  ED [facility]   : had non acute cxray and after breathing treatment. Felt better This rx  Helped and  Now coughing up stuff now.  Looks lie mucous no blood   No fever with this.   Was described as tight like "weight on chest. " No hx of lung disease No hx of asthma has allergy .  No tobacco and minimal ETS.  Never had this "like elephant on chest. "  Feels better today after breathing treatment. And inhaler using as given      Review of Systems ROS:  GEN/ HEENTNo fever, significant weight changes sweats headaches vision problems  CV/ PULM; No  shortness of breath , syncope,edema  change in exercise tolerance. GI /GU: No adominal pain, vomiting, change in bowel habits. No blood in the stool.  SKIN/HEME: ,no acute skin rashes suspicious lesions or bleeding. No lymphadenopathy, nodules, masses.  NEURO/ PSYCH:  No neurologic signs such as weakness numbness No depression anxiety. IMM/ Allergy: No unusual infections.  Allergy .   Seems stable REST of 12 system review negative  Past history family history social history reviewed in the electronic medical record.      Objective:   Physical Exam  Well-developed well-nourished in no acute distress normal speech and affect HEENT: Normocephalic ;atraumatic , Eyes;  PERRL, EOMs  Full, lids and conjunctiva clear,,Ears: no deformities, canals nl, small amount of wax in left ear TM landmarks normal, Nose: no deformity or discharge mild congestion in nares  Mouth : OP clear without lesion or edema . Neck: Supple without adenopathy or masses or  bruits Chest:  Clear to A&P without wheezes rales or rhonchi some possibly decreased breath sound but no acute changes equal breath sounds CV:  S1-S2 no gallops or murmurs peripheral perfusion is normal No clubbing cyanosis or edema    Reviewed chest x-ray report and vital signs from the emergency room  emergency room visit physician note not available in Epic EMR..          Assessment & Plan:  Cough chest congestion possible bronchospasm.   Improved after bronchodilator no obvious underlying bacterial infection  But possible viral trigger with postnasal drainage versus other. He has no history of asthma or chronic lung disease at this point. He states he feels so much better than yesterday and is only coughing up clearish to mucus.   We'll treat as asthmatic bronchitis with prednisone  X 5 days and his inhaler expectant management followup with Dr. Caryl Never if persistent progressive or worsening.

## 2011-12-20 NOTE — Patient Instructions (Signed)
This illness acts  like Bronchospasm.  Similar to an asthma attack .  This can be triggered by a virus or chest cold.  Because she did not have pneumonia we can try 5 days of prednisone and use your inhaler as needed. If you're not continuing to improve her having to use the inhaler for more than 2 weeks or worse ; come back in and see Dr. Caryl Never.   Otherwise when scheduled

## 2011-12-23 ENCOUNTER — Emergency Department (HOSPITAL_BASED_OUTPATIENT_CLINIC_OR_DEPARTMENT_OTHER)
Admission: EM | Admit: 2011-12-23 | Discharge: 2011-12-23 | Disposition: A | Discharge status: Home / Self Care | Payer: Medicare PPO | Attending: Emergency Medicine | Admitting: Emergency Medicine

## 2011-12-23 ENCOUNTER — Encounter (HOSPITAL_BASED_OUTPATIENT_CLINIC_OR_DEPARTMENT_OTHER): Payer: Self-pay | Admitting: *Deleted

## 2011-12-23 DIAGNOSIS — Z79899 Other long term (current) drug therapy: Secondary | ICD-10-CM | POA: Insufficient documentation

## 2011-12-23 DIAGNOSIS — R05 Cough: Secondary | ICD-10-CM | POA: Insufficient documentation

## 2011-12-23 DIAGNOSIS — R059 Cough, unspecified: Secondary | ICD-10-CM | POA: Insufficient documentation

## 2011-12-23 DIAGNOSIS — I10 Essential (primary) hypertension: Secondary | ICD-10-CM | POA: Insufficient documentation

## 2011-12-23 MED ORDER — ALBUTEROL SULFATE HFA 108 (90 BASE) MCG/ACT IN AERS
INHALATION_SPRAY | RESPIRATORY_TRACT | Status: AC
  Filled 2011-12-23: qty 6.7

## 2011-12-23 MED ORDER — ALBUTEROL SULFATE HFA 108 (90 BASE) MCG/ACT IN AERS
2.0000 | INHALATION_SPRAY | RESPIRATORY_TRACT | Status: DC
  Administered 2011-12-23: 2 via RESPIRATORY_TRACT

## 2011-12-23 NOTE — ED Notes (Signed)
Seen at [facility] on Christmas. Got tx and felt like it helped. Given inhaler but not sure of it.

## 2011-12-23 NOTE — ED Provider Notes (Signed)
History     CSN: [id number]  Arrival date & time 12/23/11  1754   First MD Initiated Contact with Patient 12/23/11 1922      Chief Complaint  Patient presents with  . Nasal Congestion    (Consider location/radiation/quality/duration/timing/severity/associated sxs/prior treatment) HPI Comments: Pt was seen in the er 4 days ago and was given an x-ray which was normal:pt states that he was given an inhaler and it was working, but the inhaler is out  Patient is a 77 y.o. male presenting with cough. The history is provided by the patient. No language interpreter was used.  Cough This is a new problem. The current episode started more than 2 days ago. The problem occurs hourly. The problem has not changed since onset.The cough is productive of sputum. There has been no fever. Associated symptoms include wheezing. Treatments tried: steroids.    Past Medical History  Diagnosis Date  . Hypertension   . Anxiety   . BPH (benign prostatic hyperplasia)     Past Surgical History  Procedure Date  . Cardiac catheterization     Negative / 4 years ago    History reviewed. No pertinent family history.  History  Substance Use Topics  . Smoking status: Former Smoker -- 0.5 packs/day for 5 years  . Smokeless tobacco: Never Used  . Alcohol Use: Yes      Review of Systems  Respiratory: Positive for cough and wheezing.   All other systems reviewed and are negative.    Allergies  Review of patient's allergies indicates no known allergies.  Home Medications   Current Outpatient Rx  Name Route Sig Dispense Refill  . ALBUTEROL SULFATE HFA 108 (90 BASE) MCG/ACT IN AERS Inhalation Inhale 2 puffs into the lungs every 6 (six) hours as needed. For shortness of breath    . ALPRAZOLAM 1 MG PO TABS Oral Take 1 mg by mouth daily. For sleep     . AMLODIPINE BESY-BENAZEPRIL HCL 5-20 MG PO CAPS  TAKE 1 CAPSULE EVERY DAY 30 capsule 12    GAVE 3 TABLETS  . DUTASTERIDE-TAMSULOSIN HCL 0.5-0.4 MG  PO CAPS Oral Take 1 capsule by mouth daily. 30 capsule 11  . FLUTICASONE PROPIONATE 50 MCG/ACT NA SUSP Nasal Place 1 spray into the nose daily. 16 g 5  . PREDNISONE 20 MG PO TABS Oral Take 60 mg by mouth daily. For 2 days, then take 40 mg daily for 2 days       BP 162/100  Pulse 91  Temp(Src) 97.5 F (36.4 C) (Oral)  Resp 20  Ht 5\' 10"  (1.778 m)  Wt 182 lb (82.555 kg)  BMI 26.11 kg/m2  SpO2 96%  Physical Exam  Nursing note reviewed. Constitutional: He is oriented to person, place, and time. He appears well-developed and well-nourished.  HENT:  Right Ear: External ear normal.  Left Ear: External ear normal.  Nose: Rhinorrhea present.  Mouth/Throat: Posterior oropharyngeal erythema present.  Eyes: EOM are normal.  Neck: Neck supple.  Cardiovascular: Normal rate and regular rhythm.   Pulmonary/Chest: Effort normal and breath sounds normal.  Musculoskeletal: Normal range of motion.  Neurological: He is alert and oriented to person, place, and time.  Skin: Skin is warm and dry.  Psychiatric: He has a normal mood and affect.    ED Course  Procedures (including critical care time)  Labs Reviewed - No data to display No results found.   1. Cough       MDM  Pt was instructed  on inhaler use again and was given another:unlikely the other inhaler was out although pt was having a hard time using:pt was given a spacer here        Teressa Lower, NP 12/23/11 2006

## 2011-12-23 NOTE — ED Provider Notes (Signed)
Medical screening examination/treatment/procedure(s) were performed by non-physician practitioner and as supervising physician I was immediately available for consultation/collaboration. Kalecia Hartney Y.   Ikran Patman Y. Modesta Sammons, MD 12/23/11 2344 

## 2011-12-28 ENCOUNTER — Ambulatory Visit (INDEPENDENT_AMBULATORY_CARE_PROVIDER_SITE_OTHER): Payer: Medicare PPO | Admitting: Family Medicine

## 2011-12-28 ENCOUNTER — Encounter: Payer: Self-pay | Admitting: Family Medicine

## 2011-12-28 VITALS — BP 130/80 | Temp 97.8°F | Wt 184.0 lb

## 2011-12-28 DIAGNOSIS — R6883 Chills (without fever): Secondary | ICD-10-CM

## 2011-12-28 DIAGNOSIS — R05 Cough: Secondary | ICD-10-CM

## 2011-12-28 LAB — POCT URINALYSIS DIPSTICK
Bilirubin, UA: NEGATIVE
Blood, UA: NEGATIVE
Glucose, UA: NEGATIVE
Spec Grav, UA: 1.01

## 2011-12-28 LAB — CBC WITH DIFFERENTIAL/PLATELET
Basophils Relative: 0.3 % (ref 0.0–3.0)
HCT: 46 % (ref 39.0–52.0)
Hemoglobin: 15.5 g/dL (ref 13.0–17.0)
Lymphocytes Relative: 23.3 % (ref 12.0–46.0)
MCHC: 33.8 g/dL (ref 30.0–36.0)
Monocytes Relative: 8.8 % (ref 3.0–12.0)
Neutro Abs: 5.5 10*3/uL (ref 1.4–7.7)
RBC: 4.92 Mil/uL (ref 4.22–5.81)

## 2011-12-28 MED ORDER — AZITHROMYCIN 250 MG PO TABS: ORAL_TABLET | ORAL | Status: AC

## 2011-12-28 NOTE — Progress Notes (Signed)
  Subjective:    Patient ID: Micheal Nicholson, male    DOB: Nov 26, 1920, 78 y.o.   MRN: [id number]  HPI  Acute visit. Patient had shaking chill last night around 9 PM.  Those symptoms have resolved. He never took his temperature. He denies any nasal congestion, sore throat, rash, headache, stiff neck, nausea, vomiting, or diarrhea. Has had some persistent cough for almost 2 weeks. Went to emergency room Christmas day with cough. Had some wheezing then. Cough more productive since using Ventolin spray.  Tan colored sputum.  Patient denies dyspnea. No pleuritic pain. Chest x-ray in emergency department unremarkable.  Review of Systems  Constitutional: Positive for chills. Negative for fatigue.  HENT: Negative for sore throat, neck stiffness and sinus pressure.   Respiratory: Positive for cough. Negative for shortness of breath and wheezing.   Gastrointestinal: Negative for nausea, vomiting, abdominal pain and diarrhea.  Genitourinary: Negative for dysuria.  Skin: Negative for rash.  Neurological: Negative for headaches.  Hematological: Negative for adenopathy.  Psychiatric/Behavioral: Negative for confusion.       Objective:   Physical Exam  Constitutional: He appears well-developed and well-nourished.  HENT:  Right Ear: External ear normal.  Left Ear: External ear normal.  Mouth/Throat: Oropharynx is clear and moist.  Neck: Neck supple.  Cardiovascular: Normal rate and regular rhythm.   Pulmonary/Chest: Effort normal and breath sounds normal. No respiratory distress. He has no wheezes. He has no rales.  Musculoskeletal: He exhibits no edema.  Lymphadenopathy:    He has no cervical adenopathy.          Assessment & Plan:  Chills and cough. Nonfocal exam. Normal pulse oximetry. Check CBC and urine dipstick. Go ahead and start Zithromax given his age and unexplained chill to cover for possible early community-acquired pneumonia.

## 2011-12-28 NOTE — Patient Instructions (Signed)
Follow up promptly for any worsening fever or other symptoms.

## 2011-12-29 NOTE — Progress Notes (Signed)
Quick Note:  Pt informed ______ 

## 2012-02-22 ENCOUNTER — Other Ambulatory Visit: Payer: Self-pay | Admitting: Family Medicine

## 2012-02-23 ENCOUNTER — Encounter: Payer: Self-pay | Admitting: Family Medicine

## 2012-02-23 ENCOUNTER — Ambulatory Visit (INDEPENDENT_AMBULATORY_CARE_PROVIDER_SITE_OTHER): Payer: Medicare PPO | Admitting: Family Medicine

## 2012-02-23 VITALS — BP 140/70 | Temp 97.7°F | Wt 186.0 lb

## 2012-02-23 DIAGNOSIS — F5104 Psychophysiologic insomnia: Secondary | ICD-10-CM

## 2012-02-23 DIAGNOSIS — D239 Other benign neoplasm of skin, unspecified: Secondary | ICD-10-CM

## 2012-02-23 DIAGNOSIS — N644 Mastodynia: Secondary | ICD-10-CM

## 2012-02-23 DIAGNOSIS — N6459 Other signs and symptoms in breast: Secondary | ICD-10-CM

## 2012-02-23 DIAGNOSIS — D229 Melanocytic nevi, unspecified: Secondary | ICD-10-CM

## 2012-02-23 DIAGNOSIS — G4700 Insomnia, unspecified: Secondary | ICD-10-CM

## 2012-02-23 MED ORDER — ALPRAZOLAM 1 MG PO TABS: 1.0000 mg | ORAL_TABLET | Freq: Every evening | ORAL | Status: DC | PRN

## 2012-02-23 NOTE — Patient Instructions (Signed)
Apply topical antibiotic twice daily and be in touch if any progressive redness

## 2012-02-23 NOTE — Progress Notes (Signed)
  Subjective:    Patient ID: Micheal Nicholson, male    DOB: 02/23/1920, 78 y.o.   MRN: [id number]  HPI  Patient seen with chief compllaint of soreness right nipple for one week. He has not noticed a rash but possibly some mild redness. No injury. No nipple discharge. No glandular swelling. No adenopathy. No history of similar problem.   incidentally has pigmented lesion just lateral and superior to areola.  He's not paid much attention to whether this is changing any way. No itching or bleeding.  Patient has been chronically on alprazolam 1 mg each bedtime. He received prescription in mid-February and just ran out. He is convinced someone in family might have taken 2 week supply. He has never misused this. He's had extreme difficulties with sleeping the past couple of weeks without this  Past Medical History  Diagnosis Date  . Hypertension   . Anxiety   . BPH (benign prostatic hyperplasia)    Past Surgical History  Procedure Date  . Cardiac catheterization     Negative / 4 years ago    reports that he has quit smoking. He has never used smokeless tobacco. He reports that he drinks alcohol. He reports that he does not use illicit drugs. family history is not on file. No Known Allergies    Review of Systems  Constitutional: Negative for fever, chills, appetite change and unexpected weight change.  Respiratory: Negative for shortness of breath.   Cardiovascular: Negative for chest pain.  Neurological: Negative for headaches.  Hematological: Negative for adenopathy.       Objective:   Physical Exam  Constitutional: He appears well-developed and well-nourished.  Neck: Neck supple.  Cardiovascular: Normal rate and regular rhythm.   Pulmonary/Chest: Effort normal and breath sounds normal. No respiratory distress. He has no wheezes. He has no rales.       Right nipple reveals mild erythema mostly along the underneath side. There is no warmth. No discharge. No glandular  swelling. No distinct masses. No chest wall adenopathy. The underneath side of nipple appears to be slightly abraded  Lymphadenopathy:    He has no cervical adenopathy.  Skin:          Patient has 5 by 9 mm macular atypical pigmented lesion with some color variegation and asymmetry and mild border irregularity superior and lateral right areola          Assessment & Plan:  #1 right nipple soreness. No cellulitis change. Mild skin irritation. No evidence for Paget's disease. Topical antibiotic samples given and followup promptly for signs of cellulitis #2 atypical pigmented lesion right chest wall just superior to right nipple. Doubt related to #1. Dermatology referral given atypical features #3 chronic insomnia  2 weeks supply of alprazolam only and instructed to keep this locked up in future.

## 2012-02-24 DIAGNOSIS — F5104 Psychophysiologic insomnia: Secondary | ICD-10-CM | POA: Insufficient documentation

## 2012-03-02 ENCOUNTER — Other Ambulatory Visit: Payer: Self-pay

## 2012-03-02 ENCOUNTER — Encounter (HOSPITAL_COMMUNITY): Payer: Self-pay | Admitting: *Deleted

## 2012-03-02 ENCOUNTER — Emergency Department (HOSPITAL_COMMUNITY): Payer: Medicare PPO

## 2012-03-02 ENCOUNTER — Emergency Department (HOSPITAL_COMMUNITY)
Admission: EM | Admit: 2012-03-02 | Discharge: 2012-03-02 | Disposition: A | Discharge status: Home / Self Care | Payer: Medicare PPO | Attending: Emergency Medicine | Admitting: Emergency Medicine

## 2012-03-02 DIAGNOSIS — R259 Unspecified abnormal involuntary movements: Secondary | ICD-10-CM | POA: Insufficient documentation

## 2012-03-02 DIAGNOSIS — Z79899 Other long term (current) drug therapy: Secondary | ICD-10-CM | POA: Insufficient documentation

## 2012-03-02 DIAGNOSIS — R6883 Chills (without fever): Secondary | ICD-10-CM | POA: Insufficient documentation

## 2012-03-02 DIAGNOSIS — R5381 Other malaise: Secondary | ICD-10-CM | POA: Insufficient documentation

## 2012-03-02 DIAGNOSIS — I10 Essential (primary) hypertension: Secondary | ICD-10-CM | POA: Insufficient documentation

## 2012-03-02 DIAGNOSIS — R531 Weakness: Secondary | ICD-10-CM

## 2012-03-02 LAB — URINALYSIS, ROUTINE W REFLEX MICROSCOPIC
Bilirubin Urine: NEGATIVE
Glucose, UA: NEGATIVE mg/dL
Ketones, ur: NEGATIVE mg/dL
Leukocytes, UA: NEGATIVE
pH: 6.5 (ref 5.0–8.0)

## 2012-03-02 LAB — DIFFERENTIAL
Eosinophils Relative: 2 % (ref 0–5)
Lymphocytes Relative: 39 % (ref 12–46)
Lymphs Abs: 1.6 10*3/uL (ref 0.7–4.0)
Monocytes Absolute: 0.6 10*3/uL (ref 0.1–1.0)
Monocytes Relative: 14 % — ABNORMAL HIGH (ref 3–12)

## 2012-03-02 LAB — BASIC METABOLIC PANEL
CO2: 25 mEq/L (ref 19–32)
Calcium: 9 mg/dL (ref 8.4–10.5)
Glucose, Bld: 106 mg/dL — ABNORMAL HIGH (ref 70–99)
Sodium: 140 mEq/L (ref 135–145)

## 2012-03-02 LAB — CBC
HCT: 46.3 % (ref 39.0–52.0)
Hemoglobin: 16.4 g/dL (ref 13.0–17.0)
MCV: 90.3 fL (ref 78.0–100.0)
RBC: 5.13 MIL/uL (ref 4.22–5.81)
WBC: 4.1 10*3/uL (ref 4.0–10.5)

## 2012-03-02 NOTE — ED Notes (Signed)
Pt aware of the need for a urine sample, urinal at bedside. 

## 2012-03-02 NOTE — ED Provider Notes (Signed)
History     CSN: [id number]  Arrival date & time 03/02/12  6295   First MD Initiated Contact with Patient 03/02/12 1002      Chief Complaint  Patient presents with  . Shaking    (Consider location/radiation/quality/duration/timing/severity/associated sxs/prior treatment) HPI History provided by pt.   Pt c/o feeling shaky and generally weak since waking this morning.  Almost spilled his coffee because his hands were shaking so much.  He felt as though he had the chills.  Did not check his temperature.  Recent infectious sx include sinus congestion and post-nasal drip; denies cough, dyspnea, CP, abd pain, N/V/D, urinary sx.  Pt takes xanax for anxiety and to help him sleep but he did not feel anxious this morning.  No recent medication changes.  Only had one cup of coffee this morning which is typical.    Past Medical History  Diagnosis Date  . Hypertension   . Anxiety   . BPH (benign prostatic hyperplasia)     Past Surgical History  Procedure Date  . Cardiac catheterization     Negative / 4 years ago    History reviewed. No pertinent family history.  History  Substance Use Topics  . Smoking status: Former Smoker -- 0.5 packs/day for 5 years  . Smokeless tobacco: Never Used  . Alcohol Use: Yes     occ      Review of Systems  All other systems reviewed and are negative.    Allergies  Review of patient's allergies indicates no known allergies.  Home Medications   Current Outpatient Rx  Name Route Sig Dispense Refill  . ALBUTEROL SULFATE HFA 108 (90 BASE) MCG/ACT IN AERS Inhalation Inhale 2 puffs into the lungs every 6 (six) hours as needed. For shortness of breath    . ALPRAZOLAM 1 MG PO TABS Oral Take 1 tablet (1 mg total) by mouth at bedtime as needed for sleep. 15 tablet 0  . AMLODIPINE BESY-BENAZEPRIL HCL 5-20 MG PO CAPS  TAKE 1 CAPSULE EVERY DAY 30 capsule 12    GAVE 3 TABLETS  . DUTASTERIDE-TAMSULOSIN HCL 0.5-0.4 MG PO CAPS Oral Take 1 capsule by mouth  daily. 30 capsule 11  . FLUTICASONE PROPIONATE 50 MCG/ACT NA SUSP Nasal Place 1 spray into the nose daily. 16 g 5  . OMEPRAZOLE 20 MG PO CPDR  TAKE ONE CAPSULE BY MOUTH EVERY DAY 30 capsule 11    BP 172/98  Pulse 90  Temp(Src) 97.8 F (36.6 C) (Oral)  Resp 16  Ht 5\' 10"  (1.778 m)  Wt 185 lb (83.915 kg)  BMI 26.54 kg/m2  SpO2 98%  Physical Exam  Nursing note and vitals reviewed. Constitutional: He is oriented to person, place, and time. He appears well-developed and well-nourished. No distress.  HENT:  Head: Normocephalic and atraumatic.       No sinus tenderness  Eyes:       Normal appearance  Neck: Normal range of motion.  Cardiovascular: Normal rate and regular rhythm.   Pulmonary/Chest: Effort normal and breath sounds normal.       Crackles bilateral lung bases  Abdominal: Soft. Bowel sounds are normal. He exhibits no distension. There is no tenderness.  Musculoskeletal: Normal range of motion.       No peripheral edema or calf tenderness  Neurological: He is alert and oriented to person, place, and time. He has normal reflexes. No cranial nerve deficit or sensory deficit. Coordination normal.       5/5 and equal  upper and lower extremity strength.  No past pointing.   Slight tremor both hands.   Skin: Skin is warm and dry. No rash noted.  Psychiatric: He has a normal mood and affect. His behavior is normal.    ED Course  Procedures (including critical care time)  Labs Reviewed  GLUCOSE, CAPILLARY - Abnormal; Notable for the following:    Glucose-Capillary 104 (*)    All other components within normal limits  DIFFERENTIAL - Abnormal; Notable for the following:    Monocytes Relative 14 (*)    All other components within normal limits  BASIC METABOLIC PANEL - Abnormal; Notable for the following:    Glucose, Bld 106 (*)    GFR calc non Af Amer 72 (*)    GFR calc Af Amer 83 (*)    All other components within normal limits  URINALYSIS, ROUTINE W REFLEX MICROSCOPIC -  Abnormal; Notable for the following:    Hgb urine dipstick TRACE (*)    All other components within normal limits  CBC  URINE MICROSCOPIC-ADD ON   Dg Chest 2 View  03/02/2012  *RADIOLOGY REPORT*  Clinical Data: Shaking and weak.  CHEST - 2 VIEW  Comparison: 12/19/2011  Findings: Two views of the chest were obtained.  Prominent lung markings may be accentuated by the low lung volumes.  There is no focal airspace disease.  Heart size is within normal limits.  There is irregularity along the left lateral chest wall possibly related to old rib fractures and similar to the prior examinations.  No evidence for a pneumothorax.  IMPRESSION: Low lung volumes without focal disease.  Original Report Authenticated By: Richarda Overlie, M.D.     1. Chills   2. Generalized weakness       MDM  78yo M w/ h/o HTN and BPH presents w/ c/o shaking (chills) and generalized weakness this morning.  Recent infectious sx include sinus pressure/post-nasal drip only.  No recent medication changes.  On exam, afebrile,  Crackles bilateral lung bases, Slight tremor of both hands, no focal neuro deficits.   Cbg 104.  U/A and CXR ordered to r/o infection and basic labs to look for cause of weakness.  CXR and U/A neg and labs unremarkable.  All results discussed w/ pt.  D/c'd home.  Recommended fluids and that he use a cane/walker to assist w/ ambulation. PCP f/u if sx persist.          Otilio Miu, [place] 03/02/12 2342

## 2012-03-02 NOTE — ED Notes (Signed)
Pt from home with reports of being shaky since 0800 this morning as well as feeling chilled but denies fever, similar episode happened a couple months ago.

## 2012-03-02 NOTE — Discharge Instructions (Signed)
Take tylenol or or ibuprofen if you experience chills again.  Drink plenty of fluids.  Use a cane or walker to help yourself get around until your strength comes back.  If your symptoms have not started to improve by Monday, follow up with your primary care doctor.   You may return to the ER if symptoms worsen or you have any other concerns.

## 2012-03-03 NOTE — ED Provider Notes (Signed)
Medical screening examination/treatment/procedure(s) were conducted as a shared visit with non-physician practitioner(s) and myself.  I personally evaluated the patient during the encounter Pt felt sl shaky this morning. No fever/chills/sweats. No current tremor or shakes. Pt states feels fine, requests d/c. Labs/ua.   Suzi Roots, MD 03/03/12 [id number]

## 2012-03-05 ENCOUNTER — Encounter: Payer: Self-pay | Admitting: Family

## 2012-03-05 ENCOUNTER — Ambulatory Visit (INDEPENDENT_AMBULATORY_CARE_PROVIDER_SITE_OTHER): Payer: Medicare PPO | Admitting: Family

## 2012-03-05 VITALS — BP 134/74 | HR 75 | Temp 97.8°F | Wt 186.0 lb

## 2012-03-05 DIAGNOSIS — F419 Anxiety disorder, unspecified: Secondary | ICD-10-CM

## 2012-03-05 DIAGNOSIS — F341 Dysthymic disorder: Secondary | ICD-10-CM

## 2012-03-05 MED ORDER — SERTRALINE HCL 50 MG PO TABS: 50.0000 mg | ORAL_TABLET | Freq: Every day | ORAL | Status: DC

## 2012-03-05 MED ORDER — ALPRAZOLAM 1 MG PO TABS: 1.0000 mg | ORAL_TABLET | Freq: Two times a day (BID) | ORAL | Status: DC | PRN

## 2012-03-05 NOTE — Progress Notes (Signed)
Subjective:    Patient ID: Micheal Nicholson, male    DOB: 15-Oct-1920, 78 y.o.   MRN: [id number]  HPI Comments: C/o "chills all over" x three weeks intermittently. Afebrile. Denies fever, weakness, cough, nausea, vomiting, diaphoresis, cp, or dyspnea. "I was seen in the ED 3 x over the last 3 weeks and they say everything is fine." Most recent visit to ED 4 days ago. C/o anxiety getting progressively worse and inability to sleep at hs. Took Zoloft years ago that was effective for depression and anxiety.   Anxiety Symptoms include nervous/anxious behavior. Patient reports no confusion, decreased concentration or suicidal ideas.        Review of Systems  Constitutional: Negative.   HENT: Negative.   Respiratory: Negative.   Cardiovascular: Negative.   Neurological: Negative.   Psychiatric/Behavioral: Positive for sleep disturbance. Negative for suicidal ideas, behavioral problems, confusion, self-injury, dysphoric mood, decreased concentration and agitation. The patient is nervous/anxious.    Past Medical History  Diagnosis Date  . Hypertension   . Anxiety   . BPH (benign prostatic hyperplasia)     History   Social History  . Marital Status: Widowed    Spouse Name: N/A    Number of Children: N/A  . Years of Education: N/A   Occupational History  . Not on file.   Social History Main Topics  . Smoking status: Former Smoker -- 0.5 packs/day for 5 years  . Smokeless tobacco: Never Used  . Alcohol Use: Yes     occ  . Drug Use: No  . Sexually Active: Not on file   Other Topics Concern  . Not on file   Social History Narrative  . No narrative on file    Past Surgical History  Procedure Date  . Cardiac catheterization     Negative / 4 years ago    No family history on file.  No Known Allergies  Current Outpatient Prescriptions on File Prior to Visit  Medication Sig Dispense Refill  . albuterol (VENTOLIN HFA) 108 (90 BASE) MCG/ACT inhaler Inhale 2 puffs  into the lungs every 6 (six) hours as needed. For shortness of breath      . amLODipine-benazepril (LOTREL) 5-20 MG per capsule TAKE 1 CAPSULE EVERY DAY  30 capsule  12  . Dutasteride-Tamsulosin HCl (JALYN) 0.5-0.4 MG CAPS Take 1 capsule by mouth daily.  30 capsule  11  . fluticasone (FLONASE) 50 MCG/ACT nasal spray Place 1 spray into the nose daily.  16 g  5  . DISCONTD: ALPRAZolam (XANAX) 1 MG tablet Take 1 mg by mouth at bedtime as needed. For anxiety        BP 134/74  Pulse 75  Temp(Src) 97.8 F (36.6 C) (Oral)  Wt 186 lb (84.369 kg)  SpO2 96%chart     Objective:   Physical Exam  Constitutional: He is oriented to person, place, and time. He appears well-developed and well-nourished. No distress.  Cardiovascular: Normal rate, regular rhythm, normal heart sounds and intact distal pulses.  Exam reveals no gallop and no friction rub.   No murmur heard. Pulmonary/Chest: Effort normal and breath sounds normal. No respiratory distress. He has no wheezes. He has no rales. He exhibits no tenderness.  Neurological: He is alert and oriented to person, place, and time.  Skin: Skin is warm and dry. He is not diaphoretic.          Assessment & Plan:  Assessment: Anxiety, depression Plan: Zoloft, xanax, teaching handouts provided diagnosis and medications.  Opportunity for questions provided. Encouraged to RTC if s/s get worse

## 2012-03-05 NOTE — Patient Instructions (Signed)

## 2012-03-15 ENCOUNTER — Ambulatory Visit (INDEPENDENT_AMBULATORY_CARE_PROVIDER_SITE_OTHER): Payer: Medicare PPO | Admitting: Family Medicine

## 2012-03-15 ENCOUNTER — Encounter: Payer: Self-pay | Admitting: Family Medicine

## 2012-03-15 VITALS — BP 158/84 | Temp 98.3°F | Wt 185.0 lb

## 2012-03-15 DIAGNOSIS — R002 Palpitations: Secondary | ICD-10-CM

## 2012-03-15 DIAGNOSIS — F411 Generalized anxiety disorder: Secondary | ICD-10-CM

## 2012-03-15 LAB — TSH: TSH: 0.43 u[IU]/mL (ref 0.35–5.50)

## 2012-03-15 MED ORDER — SERTRALINE HCL 100 MG PO TABS: 100.0000 mg | ORAL_TABLET | Freq: Every day | ORAL | Status: DC

## 2012-03-15 NOTE — Progress Notes (Signed)
  Subjective:    Patient ID: Micheal Nicholson, male    DOB: 1920-08-06, 78 y.o.   MRN: [id number]  HPI  Patient presents for further evaluation of recurrent "chills".  On further questioning, these are not clearly related to chills or fever. He has episodes where he feels very anxious sometimes lasting 5-6 hours. He has been seen both here and couple times emergency room with unrevealing workup. He had chest x-ray, urinalysis, CBC with no evidence for infection. He denies any headache, cough, sore throat, nasal congestion, dyspnea, abdominal pain, skin rashes, or any other indicators of infection.  He has a long history of anxiety. Has been on alprazolam for years and recently was started sertraline 50 mg once daily. He feels is not helping thus far. No clear triggers for his anxiety symptoms. He has not used albuterol recently. No other recent change in medication. No alcohol use. Denies depressive symptoms.  Past Medical History  Diagnosis Date  . Hypertension   . Anxiety   . BPH (benign prostatic hyperplasia)    Past Surgical History  Procedure Date  . Cardiac catheterization     Negative / 4 years ago    reports that he has quit smoking. He has never used smokeless tobacco. He reports that he drinks alcohol. He reports that he does not use illicit drugs. family history is not on file. No Known Allergies    Review of Systems  Constitutional: Negative for fever, activity change, appetite change and unexpected weight change.  HENT: Negative for ear pain, sore throat and sinus pressure.   Respiratory: Negative for cough, shortness of breath and wheezing.   Cardiovascular: Negative for chest pain, palpitations and leg swelling.  Gastrointestinal: Negative for nausea, vomiting and abdominal pain.  Genitourinary: Negative for dysuria.  Skin: Negative for rash.  Neurological: Negative for headaches.  Hematological: Negative for adenopathy.       Objective:   Physical Exam    Constitutional: He is oriented to person, place, and time. He appears well-developed and well-nourished.  HENT:  Right Ear: External ear normal.  Left Ear: External ear normal.  Mouth/Throat: Oropharynx is clear and moist.  Neck: Neck supple.  Cardiovascular: Normal rate and regular rhythm.   Pulmonary/Chest: Effort normal and breath sounds normal. No respiratory distress. He has no wheezes. He has no rales.  Abdominal: Soft. Bowel sounds are normal. He exhibits no distension and no mass. There is no tenderness. There is no rebound and no guarding.  Musculoskeletal: He exhibits no edema.  Lymphadenopathy:    He has no cervical adenopathy.  Neurological: He is alert and oriented to person, place, and time. No cranial nerve deficit.  Skin: No rash noted.  Psychiatric: Judgment and thought content normal.       Patient somewhat anxious but appropriate in interactions          Assessment & Plan:  Anxiety. Suspect episodes he is describing are anxiety related. Check TSH though doubt hyperthyroidism. Titrate sertraline 100 mg daily. Continue as needed use of alprazolam. Reassess within a couple of weeks

## 2012-03-15 NOTE — Patient Instructions (Signed)
Take 2 Zoloft until prescription runs out then go to ONE 100 mg tablet daily.

## 2012-03-19 NOTE — Progress Notes (Signed)
Quick Note:  Pt informed ______ 

## 2012-03-27 ENCOUNTER — Encounter: Payer: Self-pay | Admitting: Family Medicine

## 2012-03-27 ENCOUNTER — Ambulatory Visit (INDEPENDENT_AMBULATORY_CARE_PROVIDER_SITE_OTHER): Payer: Medicare PPO | Admitting: Family Medicine

## 2012-03-27 VITALS — BP 130/100 | Temp 97.6°F | Wt 180.0 lb

## 2012-03-27 DIAGNOSIS — F411 Generalized anxiety disorder: Secondary | ICD-10-CM

## 2012-03-27 DIAGNOSIS — R6883 Chills (without fever): Secondary | ICD-10-CM

## 2012-03-27 LAB — CBC WITH DIFFERENTIAL/PLATELET
Basophils Relative: 0.3 % (ref 0.0–3.0)
Eosinophils Relative: 0.6 % (ref 0.0–5.0)
HCT: 48.8 % (ref 39.0–52.0)
MCV: 95.7 fl (ref 78.0–100.0)
Monocytes Absolute: 0.5 10*3/uL (ref 0.1–1.0)
Monocytes Relative: 7.4 % (ref 3.0–12.0)
Neutrophils Relative %: 62 % (ref 43.0–77.0)
RBC: 5.1 Mil/uL (ref 4.22–5.81)
WBC: 6.4 10*3/uL (ref 4.5–10.5)

## 2012-03-27 LAB — POCT URINALYSIS DIPSTICK
Bilirubin, UA: NEGATIVE
Ketones, UA: NEGATIVE
Leukocytes, UA: NEGATIVE
Protein, UA: NEGATIVE
Spec Grav, UA: 1.01

## 2012-03-27 MED ORDER — CLONAZEPAM 1 MG PO TABS: 1.0000 mg | ORAL_TABLET | Freq: Two times a day (BID) | ORAL | Status: DC | PRN

## 2012-03-27 NOTE — Patient Instructions (Addendum)
STOP alprazolam Start Clonazepam one twice daily Continue with Sertraline 100 mg once daily Get thermometer and monitor temperature with episodes of chill

## 2012-03-27 NOTE — Progress Notes (Signed)
  Subjective:    Patient ID: Micheal Nicholson, male    DOB: 19-Jan-1920, 78 y.o.   MRN: [id number]  HPI  Patient seen for followup regarding anxiety and "chills".  Refer to prior note. Has never had any documented fever. He continues to have intermittent episodes of ? chills which he thinks may not be related to anxiety. He's had previous lab work which has not shown any evidence for infectious problem. Denies any dysuria. No cough. No chest pain. No abdominal pain. Occasional nasal congestion but no evidence for acute sinusitis. He has history of BPH but no history of any recent acute prostatitis.  We recently titrated his sertraline to 100 mg daily. Still feels quite anxious. He takes alprazolam 1 mg twice a day and has been on this for several years and has recently supplemented occasionally with third dose . He does feels this helps somewhat. Denies specific stressors. Denies depressive symptoms. No suicidal ideation.  Past Medical History  Diagnosis Date  . Hypertension   . Anxiety   . BPH (benign prostatic hyperplasia)    Past Surgical History  Procedure Date  . Cardiac catheterization     Negative / 4 years ago    reports that he has quit smoking. He has never used smokeless tobacco. He reports that he drinks alcohol. He reports that he does not use illicit drugs. family history is not on file. No Known Allergies    Review of Systems  Constitutional: Positive for fatigue.  HENT: Negative for ear pain and sore throat.   Respiratory: Negative for cough and shortness of breath.   Cardiovascular: Negative for chest pain, palpitations and leg swelling.  Gastrointestinal: Negative for abdominal pain.  Genitourinary: Negative for dysuria and hematuria.  Neurological: Negative for dizziness and weakness.  Hematological: Negative for adenopathy.  Psychiatric/Behavioral: Negative for confusion. The patient is nervous/anxious.        Objective:   Physical Exam  Constitutional:  He appears well-developed and well-nourished.  HENT:  Right Ear: External ear normal.  Left Ear: External ear normal.  Mouth/Throat: Oropharynx is clear and moist.  Neck: Neck supple.  Cardiovascular: Normal rate and regular rhythm.   Pulmonary/Chest: Effort normal and breath sounds normal. No respiratory distress. He has no wheezes. He has no rales.  Abdominal: Soft. He exhibits no distension and no mass. There is no tenderness. There is no rebound and no guarding.  Genitourinary: Rectum normal and prostate normal.  Musculoskeletal: He exhibits no edema.  Lymphadenopathy:    He has no cervical adenopathy.          Assessment & Plan:  #1 subjective sensation of chills. Patient encouraged to obtain home thermometer and monitor temperature closely during episodes. Repeat urine dipstick and CBC but doubt active infection. No evidence for prostatitis on exam today #2 chronic anxiety. Discontinue alprazolam. Continue sertraline 100 mg daily. Start clonazepam 1 mg twice a day. Reassess in 3-4 weeks

## 2012-03-28 NOTE — Progress Notes (Signed)
Quick Note:  Pt informed ______ 

## 2012-03-30 ENCOUNTER — Encounter: Payer: Self-pay | Admitting: Family Medicine

## 2012-03-30 ENCOUNTER — Ambulatory Visit (INDEPENDENT_AMBULATORY_CARE_PROVIDER_SITE_OTHER): Payer: Medicare PPO | Admitting: Family Medicine

## 2012-03-30 VITALS — BP 124/70 | Temp 98.3°F | Wt 180.0 lb

## 2012-03-30 DIAGNOSIS — R6883 Chills (without fever): Secondary | ICD-10-CM

## 2012-03-30 NOTE — Patient Instructions (Signed)
Your chills sound like your blood sugar is dropping low Make sure you have a snack before bed Follow up w/ Dr Caryl Never as directed Call with any questions or concerns Hang in there!

## 2012-03-30 NOTE — Assessment & Plan Note (Signed)
New to provider, recurrent problem for pt.  Reviewed pt's recent visits- work up has been complete.  No evidence of infxn on exam.  Pt is going 15 hrs between meals and sxs are occuring at the 11 hr mark.  Encouraged him to have bedtime snack in case sxs are due to hypoglycemia.  Pt to try this and f/u w/ PCP.

## 2012-03-30 NOTE — Progress Notes (Signed)
  Subjective:    Patient ID: Micheal Nicholson, male    DOB: 1920/12/05, 78 y.o.   MRN: [id number]  HPI Chills- pt has had sxs for over a month.  Saw Dr Caryl Never on Wednesday and was encouraged to get home thermometer to assess temp.  Pt reports every day at 3 am will wake w/ chills.  Home temp was 91.1 overnight.  Denies sweats.  CBC on Wednesday was normal.  Reports it's takes 2-5 hrs to feel better.  Went to ER 1 month ago w/out any identified cause of pt's sxs.  Pt last eats at 4pm.  No N/V/D, ear pain, nasal congestion, cough.   Review of Systems For ROS see HPI     Objective:   Physical Exam  Constitutional: He is oriented to person, place, and time. He appears well-developed and well-nourished. No distress.  HENT:  Head: Normocephalic and atraumatic.  Nose: Nose normal.  Mouth/Throat: Oropharynx is clear and moist.       TMs normal bilaterally No TTP over sinuses  Eyes: Conjunctivae and EOM are normal. Pupils are equal, round, and reactive to light.  Neck: Normal range of motion. Neck supple. No thyromegaly present.  Cardiovascular: Normal rate, regular rhythm, normal heart sounds and intact distal pulses.   No murmur heard. Pulmonary/Chest: Effort normal and breath sounds normal. No respiratory distress.  Abdominal: Soft. Bowel sounds are normal. He exhibits no distension.  Musculoskeletal: He exhibits no edema.  Lymphadenopathy:    He has no cervical adenopathy.  Neurological: He is alert and oriented to person, place, and time. No cranial nerve deficit.  Skin: Skin is warm and dry.  Psychiatric: He has a normal mood and affect. His behavior is normal.          Assessment & Plan:

## 2012-04-01 ENCOUNTER — Ambulatory Visit: Payer: Medicare PPO | Admitting: Family Medicine

## 2012-04-01 ENCOUNTER — Encounter: Payer: Self-pay | Admitting: Family Medicine

## 2012-04-01 ENCOUNTER — Ambulatory Visit (INDEPENDENT_AMBULATORY_CARE_PROVIDER_SITE_OTHER): Payer: Medicare PPO | Admitting: Family Medicine

## 2012-04-01 VITALS — BP 140/90 | Temp 98.4°F | Wt 184.0 lb

## 2012-04-01 DIAGNOSIS — F411 Generalized anxiety disorder: Secondary | ICD-10-CM

## 2012-04-01 DIAGNOSIS — R6883 Chills (without fever): Secondary | ICD-10-CM

## 2012-04-01 NOTE — Patient Instructions (Signed)
Continue to eat three regular meals daily and may supplement with healthy snacks as needed

## 2012-04-01 NOTE — Progress Notes (Signed)
  Subjective:    Patient ID: Micheal Nicholson, male    DOB: 04-09-1920, 78 y.o.   MRN: [id number]  HPI  Patient is seen with repeated episodes of reported chills. His last visit we had encouraged to get a thermometer but he has not documented any fever. He was seen in Saturday clinic and suggested that he may be having low blood sugar reactions but he does not take any blood sugar medications. He has not have any significant risk for hypoglycemia other than relative hypoglycemia from not eating regularly. He is eating 3 meals daily. It was suggested that he decrease interval between meals. Trying some snacks but still having occasional symptoms. He has not had any headache, sore throat, sinus congestion, cough, abdominal pain, dysuria.  We recently increased his sertraline 100 mg and clonazepam in place of alprazolam he thinks anxiety is improving. We have suspected some of this may be anxiety related. He did not have any evidence for delusional disorder. Recent TSH normal.  Denies depression.  Past Medical History  Diagnosis Date  . Hypertension   . Anxiety   . BPH (benign prostatic hyperplasia)    Past Surgical History  Procedure Date  . Cardiac catheterization     Negative / 4 years ago    reports that he has quit smoking. He has never used smokeless tobacco. He reports that he drinks alcohol. He reports that he does not use illicit drugs. family history is not on file. No Known Allergies    Review of Systems  Constitutional: Positive for chills. Negative for fever, appetite change and unexpected weight change.  HENT: Negative for ear pain, congestion and sore throat.   Respiratory: Negative for cough and shortness of breath.   Gastrointestinal: Negative for nausea, vomiting and abdominal pain.  Genitourinary: Negative for dysuria and hematuria.  Musculoskeletal: Negative for back pain.  Skin: Negative for rash.  Neurological: Negative for weakness and headaches.    Hematological: Negative for adenopathy.  Psychiatric/Behavioral: Negative for confusion and agitation.       Objective:   Physical Exam  Constitutional: He appears well-developed and well-nourished.  HENT:  Right Ear: External ear normal.  Left Ear: External ear normal.  Mouth/Throat: Oropharynx is clear and moist.  Neck: Neck supple.  Cardiovascular: Normal rate and regular rhythm.   Pulmonary/Chest: Effort normal and breath sounds normal. No respiratory distress. He has no wheezes. He has no rales.  Lymphadenopathy:    He has no cervical adenopathy.          Assessment & Plan:  Subjective chills. No fever documented thus far. No evidence for infection from lab testing. Still suspect some/most of this may be anxiety related. Discussed dietary factors. Continue temperature monitoring. Continue anti anxiety medications as above.  Symptoms have improved somewhat ?related to change of anxiety meds (increased Zoloft and clonazepam). Anxiety-slightly improved as above.

## 2012-04-18 ENCOUNTER — Emergency Department (HOSPITAL_COMMUNITY): Payer: Medicare PPO

## 2012-04-18 ENCOUNTER — Emergency Department (HOSPITAL_COMMUNITY)
Admission: EM | Admit: 2012-04-18 | Discharge: 2012-04-18 | Disposition: A | Discharge status: Home / Self Care | Payer: Medicare PPO | Attending: Emergency Medicine | Admitting: Emergency Medicine

## 2012-04-18 DIAGNOSIS — R5381 Other malaise: Secondary | ICD-10-CM | POA: Insufficient documentation

## 2012-04-18 DIAGNOSIS — R059 Cough, unspecified: Secondary | ICD-10-CM | POA: Insufficient documentation

## 2012-04-18 DIAGNOSIS — Z79899 Other long term (current) drug therapy: Secondary | ICD-10-CM | POA: Insufficient documentation

## 2012-04-18 DIAGNOSIS — I10 Essential (primary) hypertension: Secondary | ICD-10-CM | POA: Insufficient documentation

## 2012-04-18 DIAGNOSIS — R6883 Chills (without fever): Secondary | ICD-10-CM | POA: Insufficient documentation

## 2012-04-18 DIAGNOSIS — R05 Cough: Secondary | ICD-10-CM

## 2012-04-18 DIAGNOSIS — R5383 Other fatigue: Secondary | ICD-10-CM | POA: Insufficient documentation

## 2012-04-18 DIAGNOSIS — R911 Solitary pulmonary nodule: Secondary | ICD-10-CM | POA: Insufficient documentation

## 2012-04-18 LAB — DIFFERENTIAL
Eosinophils Relative: 0 % (ref 0–5)
Lymphocytes Relative: 33 % (ref 12–46)
Lymphs Abs: 2.1 10*3/uL (ref 0.7–4.0)
Monocytes Absolute: 0.5 10*3/uL (ref 0.1–1.0)
Monocytes Relative: 8 % (ref 3–12)
Neutro Abs: 3.7 10*3/uL (ref 1.7–7.7)

## 2012-04-18 LAB — URINALYSIS, ROUTINE W REFLEX MICROSCOPIC
Glucose, UA: NEGATIVE mg/dL
Hgb urine dipstick: NEGATIVE
Ketones, ur: NEGATIVE mg/dL
Leukocytes, UA: NEGATIVE
Protein, ur: NEGATIVE mg/dL
pH: 7 (ref 5.0–8.0)

## 2012-04-18 LAB — BASIC METABOLIC PANEL
CO2: 19 mEq/L (ref 19–32)
Calcium: 8.8 mg/dL (ref 8.4–10.5)
Chloride: 100 mEq/L (ref 96–112)
Creatinine, Ser: 0.96 mg/dL (ref 0.50–1.35)
Glucose, Bld: 99 mg/dL (ref 70–99)

## 2012-04-18 LAB — CBC
HCT: 46.8 % (ref 39.0–52.0)
Hemoglobin: 16.5 g/dL (ref 13.0–17.0)
MCV: 90.3 fL (ref 78.0–100.0)
RBC: 5.18 MIL/uL (ref 4.22–5.81)
RDW: 12.6 % (ref 11.5–15.5)
WBC: 6.3 10*3/uL (ref 4.0–10.5)

## 2012-04-18 NOTE — Discharge Instructions (Signed)
Your chest x-ray does not show pneumonia.  Your blood and urine tests do not show any significant illness.  Followup with your Dr. if your symptoms persist.  For more than 2-3 days return for worse or uncontrolled symptoms

## 2012-04-18 NOTE — ED Provider Notes (Signed)
History     CSN: [id number]  Arrival date & time 04/18/12  1604   First MD Initiated Contact with Patient 04/18/12 1645      Chief Complaint  Patient presents with  . Cough  . Chills    (Consider location/radiation/quality/duration/timing/severity/associated sxs/prior treatment) The history is provided by the patient and a relative.   The patient is a 78 year old, male, with a history of hypertension, and BPH, who presents to emergency department complaining of a week long history of chills and diaphoresis.  This morning.  He had a nonproductive cough, and a sensation of irritation in his throat.  He denies pain anywhere.  He denies sore throat, or ear aches.  He denies nausea, vomiting, shortness of breath.  He denies abdominal pain, diarrhea, dizziness, vision changes.  He states that it is difficult for him to start urination.  He does not have dysuria. Past Medical History  Diagnosis Date  . Hypertension   . Anxiety   . BPH (benign prostatic hyperplasia)     Past Surgical History  Procedure Date  . Cardiac catheterization     Negative / 4 years ago    No family history on file.  History  Substance Use Topics  . Smoking status: Former Smoker -- 0.5 packs/day for 5 years  . Smokeless tobacco: Never Used  . Alcohol Use: Yes     occ      Review of Systems  Constitutional: Positive for chills and diaphoresis. Negative for fever.  HENT: Negative for sore throat and neck pain.   Eyes: Negative for visual disturbance.  Respiratory: Positive for cough. Negative for chest tightness and shortness of breath.   Cardiovascular: Negative for chest pain.  Gastrointestinal: Negative for nausea, vomiting, abdominal pain and diarrhea.  Genitourinary: Positive for decreased urine volume. Negative for dysuria.  Skin: Negative for rash.  Neurological: Negative for headaches.  Psychiatric/Behavioral: Negative for confusion.  All other systems reviewed and are  negative.    Allergies  Review of patient's allergies indicates no known allergies.  Home Medications   Current Outpatient Rx  Name Route Sig Dispense Refill  . ALBUTEROL SULFATE HFA 108 (90 BASE) MCG/ACT IN AERS Inhalation Inhale 2 puffs into the lungs every 6 (six) hours as needed. For shortness of breath    . AMLODIPINE BESY-BENAZEPRIL HCL 5-20 MG PO CAPS  TAKE 1 CAPSULE EVERY DAY 30 capsule 12    GAVE 3 TABLETS  . CLONAZEPAM 1 MG PO TABS Oral Take 1 tablet (1 mg total) by mouth 2 (two) times daily as needed for anxiety. 60 tablet 1  . DUTASTERIDE-TAMSULOSIN HCL 0.5-0.4 MG PO CAPS Oral Take 1 capsule by mouth daily. 30 capsule 11  . FLUTICASONE PROPIONATE 50 MCG/ACT NA SUSP Nasal Place 1 spray into the nose daily. 16 g 5  . SERTRALINE HCL 100 MG PO TABS Oral Take 1 tablet (100 mg total) by mouth daily. 30 tablet 3    BP 127/81  Pulse 84  Temp(Src) 97.7 F (36.5 C) (Oral)  Resp 16  Ht 5\' 10"  (1.778 m)  Wt 180 lb (81.647 kg)  BMI 25.83 kg/m2  SpO2 99%  Physical Exam  Vitals reviewed. Constitutional: He is oriented to person, place, and time. He appears well-developed and well-nourished.  HENT:  Head: Normocephalic and atraumatic.  Mouth/Throat: Oropharynx is clear and moist.  Eyes: Conjunctivae and EOM are normal.  Neck: Normal range of motion. Neck supple.  Cardiovascular: Normal rate.   No murmur heard. Pulmonary/Chest: Effort  normal and breath sounds normal. No respiratory distress. He has no rales.  Abdominal: Soft. Bowel sounds are normal. He exhibits no distension. There is no tenderness. There is no rebound and no guarding.  Musculoskeletal: Normal range of motion. He exhibits no edema.  Neurological: He is alert and oriented to person, place, and time.  Skin: Skin is warm and dry.  Psychiatric: He has a normal mood and affect. Thought content normal.    ED Course  Procedures (including critical care time) 78 year old, male, with no significant past medical  history presents emergency department with a week long history of sweating, and chills and then, a nonproductive cough, and irritation in his throat.  Today.  His physical examination is normal.  We will perform a chest x-ray, and laboratory testing for further evaluation and she is asymptomatic and afebrile.  There is no indication for medical therapy.  At this time   Labs Reviewed  CBC  DIFFERENTIAL  BASIC METABOLIC PANEL  URINALYSIS, ROUTINE W REFLEX MICROSCOPIC   Dg Chest 2 View  04/18/2012  *RADIOLOGY REPORT*  Clinical Data: Cough, congestion and weakness.  CHEST - 2 VIEW  Comparison: Chest x-ray 03/02/2012.  Findings: Lung volumes are normal.  No consolidative airspace disease.  No pleural effusions.  Pulmonary vasculature is normal. Heart size is borderline enlarged with prominent left ventricular contour which may suggest underlying left ventricular hypertrophy. Tortuosity of the thoracic aorta and atherosclerosis is noted.  On the initial PA view there were well-defined nodular densities projecting over the posterolateral aspect of the right fifth rib, and projecting over the anterior aspect of the right sixth rib near the base of the right hemithorax.  Likewise there is a similar opacity projecting between the anterior aspect of the left fifth and sixth ribs.  These are new compared to the prior examination from 03/02/2012, and were favored to represent ECG leads (per report from the technologist, the patient did have leads in these regions). A repeat radiograph was requested following removal of all exterior leads, and that radiograph demonstrated resolution of the right upper and left lower nodular opacities, however, there is a persistent nodular opacity projecting over the right lower lung measuring approximately 1 cm adjacent to the anterior aspect of the right sixth rib.  IMPRESSION: 1.  No radiographic evidence of acute cardiopulmonary disease. 2.  Atherosclerosis. 3.  Borderline  cardiomegaly with evidence to suggest left ventricular hypertrophy. 4. New 1 cm dense nodular opacity projecting over the right lower lung.  This cannot be localized on the lateral projection and may be either within the right middle or lower lobe, or may simply be a prominent costochondral cartilage calcification. Although not visualized on the recent prior study from 03/02/2012 (which was a lower volume film taken from different projection), a similar (slightly smaller) density was present in retrospect on prior examination 12/19/2011. At this time, a repeat PA and lateral chest radiograph in 2-3 months is recommended to evaluate for the stability or resolution of this finding.  Original Report Authenticated By: Florencia Reasons, M.D.     No diagnosis found.    MDM  Chills Nonproductive cough        Cheri Guppy, MD 04/18/12 1921

## 2012-04-18 NOTE — ED Notes (Signed)
C/O dry cough "all day today and can't bring anything up"  Also c/o weakness to legs today. Has been having chills x about 5 weeks. Was told to start eating before bed b/c it may be due to low blood sugar.  States it helped for a while but not now.  PMD told him to buy a thermometer and check when he's having the chills. States it was 98.0 when he checked.  Pt has dry cough in triage.

## 2012-04-19 ENCOUNTER — Ambulatory Visit: Payer: Medicare PPO | Admitting: Family Medicine

## 2012-04-25 ENCOUNTER — Encounter: Payer: Self-pay | Admitting: Family Medicine

## 2012-04-25 ENCOUNTER — Ambulatory Visit (INDEPENDENT_AMBULATORY_CARE_PROVIDER_SITE_OTHER): Payer: Medicare PPO | Admitting: Family Medicine

## 2012-04-25 VITALS — BP 140/90 | Temp 97.5°F | Wt 183.0 lb

## 2012-04-25 DIAGNOSIS — G4700 Insomnia, unspecified: Secondary | ICD-10-CM

## 2012-04-25 DIAGNOSIS — R911 Solitary pulmonary nodule: Secondary | ICD-10-CM

## 2012-04-25 DIAGNOSIS — F5104 Psychophysiologic insomnia: Secondary | ICD-10-CM

## 2012-04-25 DIAGNOSIS — R5381 Other malaise: Secondary | ICD-10-CM

## 2012-04-25 DIAGNOSIS — F411 Generalized anxiety disorder: Secondary | ICD-10-CM

## 2012-04-25 DIAGNOSIS — R6883 Chills (without fever): Secondary | ICD-10-CM

## 2012-04-25 NOTE — Progress Notes (Signed)
  Subjective:    Patient ID: Micheal Nicholson, male    DOB: December 09, 1920, 78 y.o.   MRN: [id number]  HPI  Medical followup. Patient has history of hypertension, chronic anxiety, chronic insomnia, and BPH. He has recently had multiple ED visits for chills without fever. He's had multiple labs including repeat CBC and urinalysis which is showed no signs of acute infection. Most recently went to emergency room 4/25 with cough. Labs were unremarkable. Chest x-ray 1 cm nonspecific right lower lobe nodule with recommendation repeat in 2 months. Patient nonsmoker. No appetite or weight changes. No hemoptysis.  Has complained of recurrent chills but again has never had fever. Those seemed to improve since last visit. We suspect that some of this if not most may be related to severe anxiety. We changed from alprazolam to clonazepam and increased his sertraline 100 mg and symptoms do seem somewhat improved. He denies depressive symptoms. He has had some progressive generalized weakness that has been less active with usual activities though past couple of months. Denies any focal weakness. No appetite change  Past Medical History  Diagnosis Date  . Hypertension   . Anxiety   . BPH (benign prostatic hyperplasia)    Past Surgical History  Procedure Date  . Cardiac catheterization     Negative / 4 years ago    reports that he has quit smoking. He has never used smokeless tobacco. He reports that he drinks alcohol. He reports that he does not use illicit drugs. family history is not on file. No Known Allergies   Review of Systems  Constitutional: Positive for activity change (less active) and fatigue. Negative for fever and appetite change.  HENT: Negative for trouble swallowing.   Eyes: Negative for visual disturbance.  Respiratory: Negative for shortness of breath.   Cardiovascular: Negative for chest pain, palpitations and leg swelling.  Gastrointestinal: Negative for abdominal pain.    Genitourinary: Negative for dysuria.  Musculoskeletal: Negative for arthralgias.  Skin: Negative for rash.  Neurological: Negative for dizziness, seizures and syncope.  Hematological: Negative for adenopathy.  Psychiatric/Behavioral: Negative for confusion and agitation.       Objective:   Physical Exam  Constitutional: He is oriented to person, place, and time. He appears well-developed and well-nourished.  HENT:  Mouth/Throat: Oropharynx is clear and moist.  Neck: Neck supple. No thyromegaly present.  Cardiovascular: Normal rate and regular rhythm.   Pulmonary/Chest: Effort normal and breath sounds normal. No respiratory distress. He has no wheezes. He has no rales.  Musculoskeletal: He exhibits no edema.  Neurological: He is alert and oriented to person, place, and time. No cranial nerve deficit.  Skin: No rash noted.  Psychiatric: He has a normal mood and affect.          Assessment & Plan:  #1 recurrent chills with no fever. His symptoms seem to be slowly abating as his anxiety improves. I suspect this is largely anxiety related. He has concerns about low blood sugar but this has never been confirmed and he is not taking any hypoglycemic-type medications  #2 nonspecific right centimeter nodule right lower lobe incidentally on recent chest x-ray. Repeat chest x-ray in 2 months at followup #3 generalized deconditioning. Schedule physical therapy. He has been less active past couple months and we try to get back to normal activity levels  #4 chronic anxiety somewhat improved with increased sertraline and Klonopin. Consider tapering back Klonopin if symptoms remain stable

## 2012-05-24 ENCOUNTER — Other Ambulatory Visit: Payer: Self-pay | Admitting: *Deleted

## 2012-05-24 MED ORDER — ALPRAZOLAM 1 MG PO TABS: 1.0000 mg | ORAL_TABLET | Freq: Two times a day (BID) | ORAL | Status: DC | PRN

## 2012-06-13 ENCOUNTER — Telehealth: Payer: Self-pay | Admitting: Family Medicine

## 2012-06-13 ENCOUNTER — Other Ambulatory Visit: Payer: Self-pay | Admitting: Family Medicine

## 2012-06-13 DIAGNOSIS — R4781 Slurred speech: Secondary | ICD-10-CM

## 2012-06-13 NOTE — Telephone Encounter (Signed)
Patient's daughter in law called stating that the patient needs a neurology referral (Dr. Sandria Manly with [facility]) for his chills,weakness,slurred speech that keeps coming and going. Please advise.

## 2012-06-13 NOTE — Telephone Encounter (Signed)
I spoke with pt, Micheal Nicholson, and yes he is in agreement with the request for neurology referral as discussed with family.  I will make the referral, diagnosis: slurred speech..  Pt aware, FYI only

## 2012-06-13 NOTE — Telephone Encounter (Signed)
I am happy to refer-if pt is agreeable.

## 2012-06-20 ENCOUNTER — Other Ambulatory Visit: Payer: Self-pay | Admitting: *Deleted

## 2012-06-20 NOTE — Telephone Encounter (Signed)
We will clarify with pt at OV next week.  Denied

## 2012-06-20 NOTE — Telephone Encounter (Signed)
Need clarification. We have transitioned him to Klonopin. He should not be taking clonazepam and alprazolam.

## 2012-06-20 NOTE — Telephone Encounter (Signed)
Alprazolam last filled 03-25-12, #60 with 0 refills.  Pt has a F/U visit next week

## 2012-06-25 ENCOUNTER — Encounter: Payer: Self-pay | Admitting: Family Medicine

## 2012-06-25 ENCOUNTER — Ambulatory Visit (INDEPENDENT_AMBULATORY_CARE_PROVIDER_SITE_OTHER): Payer: Medicare PPO | Admitting: Family Medicine

## 2012-06-25 VITALS — BP 138/80 | HR 80 | Temp 98.6°F | Wt 188.0 lb

## 2012-06-25 DIAGNOSIS — F411 Generalized anxiety disorder: Secondary | ICD-10-CM

## 2012-06-25 DIAGNOSIS — R6883 Chills (without fever): Secondary | ICD-10-CM

## 2012-06-25 MED ORDER — CLONAZEPAM 1 MG PO TABS: 1.0000 mg | ORAL_TABLET | Freq: Two times a day (BID) | ORAL | Status: DC | PRN

## 2012-06-25 NOTE — Progress Notes (Signed)
  Subjective:    Patient ID: Micheal Nicholson, male    DOB: 01-11-1920, 78 y.o.   MRN: [id number]  HPI  Patient seen for medical followup. He's had extensive workup recently for repeated episodes of " chills". Saw a neurologist who ordered fairly extensive blood tests for metabolic workup some of which are again done and reportedly this was all normal. MRI of brain pending. He had no further episodes of the past few weeks. He's had some generalized weakness we sent her for physical therapy and is doing some walking exercises. Ambulating with cane. No falls. No history of recent head injury. He feels memory somewhat impaired.  Patient's no chronic antidiabetic medications and was on Xanax for years when I inherited him as a patient. We have recently transitioned to clonazepam and is requesting refills. Denies depression.  Past Medical History  Diagnosis Date  . Hypertension   . Anxiety   . BPH (benign prostatic hyperplasia)    Past Surgical History  Procedure Date  . Cardiac catheterization     Negative / 4 years ago    reports that he has quit smoking. He has never used smokeless tobacco. He reports that he drinks alcohol. He reports that he does not use illicit drugs. family history is not on file. No Known Allergies   Review of Systems  Constitutional: Positive for fatigue. Negative for fever, chills, appetite change and unexpected weight change.  Respiratory: Negative for cough and shortness of breath.   Cardiovascular: Negative for chest pain.  Gastrointestinal: Negative for abdominal pain.  Genitourinary: Negative for dysuria.  Neurological: Positive for weakness. Negative for dizziness, seizures, syncope and headaches.       Objective:   Physical Exam  Constitutional: He is oriented to person, place, and time. He appears well-developed and well-nourished.  HENT:  Right Ear: External ear normal.  Left Ear: External ear normal.  Neck: Neck supple. No thyromegaly  present.       No carotid bruits  Cardiovascular: Normal rate and regular rhythm.   Pulmonary/Chest: Effort normal and breath sounds normal. No respiratory distress. He has no wheezes. He has no rales.  Neurological: He is alert and oriented to person, place, and time. No cranial nerve deficit.       No focal strength deficits. Able to transfer chair to table without assistance. Ambulating with cane  Psychiatric: He has a normal mood and affect. His behavior is normal.          Assessment & Plan:  #1 generalized weakness. Patient's had several weeks of reported intermittent "chills" but no evidence for infectious origin. These seem to have resolved. Neurology workup including pending MRI scan. #2 history of chronic anxiety. We have emphatically stressed he cannot take more than one benzodiazepine (had questions about refilling Xanax). He is now on Clonazepam. He is cautioned about risk of falls. He has been on benzodiazepines for  Many years prior to seeing me and has not tolerated tapering well in past.

## 2012-06-28 ENCOUNTER — Other Ambulatory Visit: Payer: Self-pay | Admitting: Neurology

## 2012-06-28 DIAGNOSIS — R413 Other amnesia: Secondary | ICD-10-CM

## 2012-06-28 DIAGNOSIS — R269 Unspecified abnormalities of gait and mobility: Secondary | ICD-10-CM

## 2012-06-28 DIAGNOSIS — R471 Dysarthria and anarthria: Secondary | ICD-10-CM

## 2012-07-06 ENCOUNTER — Ambulatory Visit
Admission: RE | Admit: 2012-07-06 | Discharge: 2012-07-06 | Disposition: A | Discharge status: Home / Self Care | Payer: Medicare PPO | Source: Ambulatory Visit | Attending: Neurology | Admitting: Neurology

## 2012-07-06 DIAGNOSIS — R471 Dysarthria and anarthria: Secondary | ICD-10-CM

## 2012-07-06 DIAGNOSIS — R413 Other amnesia: Secondary | ICD-10-CM

## 2012-07-06 DIAGNOSIS — R269 Unspecified abnormalities of gait and mobility: Secondary | ICD-10-CM

## 2012-07-17 ENCOUNTER — Other Ambulatory Visit (HOSPITAL_COMMUNITY): Payer: Self-pay | Admitting: Neurology

## 2012-07-17 DIAGNOSIS — R21 Rash and other nonspecific skin eruption: Secondary | ICD-10-CM

## 2012-07-17 DIAGNOSIS — I6322 Cerebral infarction due to unspecified occlusion or stenosis of basilar arteries: Secondary | ICD-10-CM

## 2012-07-17 DIAGNOSIS — R269 Unspecified abnormalities of gait and mobility: Secondary | ICD-10-CM

## 2012-07-18 ENCOUNTER — Other Ambulatory Visit (HOSPITAL_COMMUNITY): Payer: Medicare PPO

## 2012-07-31 ENCOUNTER — Ambulatory Visit (HOSPITAL_COMMUNITY): Payer: Medicare PPO | Attending: Cardiovascular Disease | Admitting: Radiology

## 2012-07-31 DIAGNOSIS — I517 Cardiomegaly: Secondary | ICD-10-CM | POA: Insufficient documentation

## 2012-07-31 DIAGNOSIS — R269 Unspecified abnormalities of gait and mobility: Secondary | ICD-10-CM

## 2012-07-31 DIAGNOSIS — I6322 Cerebral infarction due to unspecified occlusion or stenosis of basilar arteries: Secondary | ICD-10-CM | POA: Insufficient documentation

## 2012-07-31 DIAGNOSIS — I6789 Other cerebrovascular disease: Secondary | ICD-10-CM

## 2012-07-31 DIAGNOSIS — Z87891 Personal history of nicotine dependence: Secondary | ICD-10-CM | POA: Insufficient documentation

## 2012-07-31 DIAGNOSIS — I10 Essential (primary) hypertension: Secondary | ICD-10-CM | POA: Insufficient documentation

## 2012-07-31 NOTE — Progress Notes (Signed)
Echocardiogram performed.  

## 2012-08-01 ENCOUNTER — Encounter (HOSPITAL_COMMUNITY): Payer: Self-pay | Admitting: Neurology

## 2012-08-08 ENCOUNTER — Other Ambulatory Visit: Payer: Self-pay | Admitting: *Deleted

## 2012-08-08 MED ORDER — AMLODIPINE BESY-BENAZEPRIL HCL 5-20 MG PO CAPS: 1.0000 | ORAL_CAPSULE | Freq: Every day | ORAL | Status: DC

## 2012-08-13 ENCOUNTER — Other Ambulatory Visit: Payer: Self-pay | Admitting: Family Medicine

## 2012-09-25 ENCOUNTER — Encounter: Payer: Self-pay | Admitting: Family Medicine

## 2012-09-25 ENCOUNTER — Ambulatory Visit (INDEPENDENT_AMBULATORY_CARE_PROVIDER_SITE_OTHER): Payer: Medicare PPO | Admitting: Family Medicine

## 2012-09-25 VITALS — BP 120/82 | HR 80 | Temp 97.4°F | Resp 12 | Wt 187.0 lb

## 2012-09-25 DIAGNOSIS — Z23 Encounter for immunization: Secondary | ICD-10-CM

## 2012-09-25 DIAGNOSIS — F411 Generalized anxiety disorder: Secondary | ICD-10-CM

## 2012-09-25 DIAGNOSIS — I679 Cerebrovascular disease, unspecified: Secondary | ICD-10-CM

## 2012-09-25 DIAGNOSIS — N401 Enlarged prostate with lower urinary tract symptoms: Secondary | ICD-10-CM

## 2012-09-25 DIAGNOSIS — Z9181 History of falling: Secondary | ICD-10-CM

## 2012-09-25 DIAGNOSIS — I10 Essential (primary) hypertension: Secondary | ICD-10-CM

## 2012-09-25 LAB — LIPID PANEL
Cholesterol: 165 mg/dL (ref 0–200)
HDL: 50.1 mg/dL (ref >39.00)
LDL Cholesterol: 94 mg/dL (ref 0–99)
VLDL: 21 mg/dL (ref 0.0–40.0)

## 2012-09-25 NOTE — Progress Notes (Signed)
  Subjective:    Patient ID: Micheal Nicholson, male    DOB: [birth year], 78 y.o.   MRN: [id number]  HPI  Medical followup. Patient has history of hypertension, BPH, chronic anxiety, chronic insomnia, GERD. Several months ago he had recurrent issues with chills. Multiple x-rays and lab work which were unrevealing. No evidence for infectious etiology. He developed some generalized weakness and possible acute memory disturbance.  Was seen by neurology. Reportedly had MRI scan which showed CVA though we do not have that report and not clear if this was acute or chronic. Patient never had focal weakness. By description, he had what sounds like carotid Dopplers and also had echocardiogram which were unrevealing. He was instructed to take aspirin one daily. Overall he had no further chills. No dysarthria. No focal weakness but generally weak.  We've referred him for physical therapy. He had a few falls recently. Denies orthostasis. No chest pains. No dyspnea. Compliant with medications. No confusion. No headaches.  Past Medical History  Diagnosis Date  . Hypertension   . Anxiety   . BPH (benign prostatic hyperplasia)    Past Surgical History  Procedure Date  . Cardiac catheterization     Negative / 4 years ago    reports that he has quit smoking. He has never used smokeless tobacco. He reports that he drinks alcohol. He reports that he does not use illicit drugs. family history is not on file. No Known Allergies    Review of Systems  Constitutional: Negative for fatigue.  Eyes: Negative for visual disturbance.  Respiratory: Negative for cough, chest tightness and shortness of breath.   Cardiovascular: Negative for chest pain, palpitations and leg swelling.  Neurological: Positive for weakness (Generalized). Negative for dizziness, seizures, syncope, light-headedness, numbness and headaches.       Objective:   Physical Exam  Constitutional: He is oriented to person, place, and time.  He appears well-developed and well-nourished.  HENT:  Mouth/Throat: Oropharynx is clear and moist.  Neck: Neck supple. No thyromegaly present.  Cardiovascular: Normal rate and regular rhythm.   Pulmonary/Chest: Effort normal and breath sounds normal. No respiratory distress. He has no wheezes. He has no rales.  Neurological: He is alert and oriented to person, place, and time. No cranial nerve deficit.       No focal strength deficits. No ataxia. Slightly unsteady gait  Psychiatric: He has a normal mood and affect. His behavior is normal.          Assessment & Plan:  #1 hypertension. Stable. Continue current medication. No evidence for orthostasis  #2 increase risk for falls. We've recommended use of cane. We strongly recommended tapering back on clonazepam to lower dose and try to further taper his medications with one-month follow up.  He has already had physical therapy which he felt did not help much #3 reported cerebrovascular disease with history of CVA. Send for check lipid panel. Continue aspirin.  Get record of recent MRI. #4 health maintenance. Flu vaccine given

## 2012-09-25 NOTE — Patient Instructions (Signed)
Reduce clonazepam 1 mg one half tablet twice daily Consider cane use at all times to reduce risk of fall Continue aspirin one daily

## 2012-09-26 NOTE — Progress Notes (Signed)
Quick Note:  Pt informed on home VM ______ 

## 2012-10-28 ENCOUNTER — Ambulatory Visit (INDEPENDENT_AMBULATORY_CARE_PROVIDER_SITE_OTHER): Payer: Medicare PPO | Admitting: Family Medicine

## 2012-10-28 ENCOUNTER — Encounter: Payer: Self-pay | Admitting: Family Medicine

## 2012-10-28 VITALS — BP 140/80 | Temp 97.6°F | Wt 186.0 lb

## 2012-10-28 DIAGNOSIS — F411 Generalized anxiety disorder: Secondary | ICD-10-CM

## 2012-10-28 DIAGNOSIS — N401 Enlarged prostate with lower urinary tract symptoms: Secondary | ICD-10-CM

## 2012-10-28 DIAGNOSIS — R413 Other amnesia: Secondary | ICD-10-CM

## 2012-10-28 DIAGNOSIS — I10 Essential (primary) hypertension: Secondary | ICD-10-CM

## 2012-10-28 LAB — VITAMIN B12: Vitamin B-12: 255 pg/mL (ref 211–911)

## 2012-10-28 MED ORDER — DUTASTERIDE-TAMSULOSIN HCL 0.5-0.4 MG PO CAPS: 1.0000 | ORAL_CAPSULE | Freq: Every day | ORAL | Status: DC

## 2012-10-28 MED ORDER — CLONAZEPAM 1 MG PO TABS: ORAL_TABLET | ORAL | Status: DC

## 2012-10-28 NOTE — Progress Notes (Signed)
  Subjective:    Patient ID: Micheal Nicholson, male    DOB: 06-27-1920, 78 y.o.   MRN: [id number]  HPI  Patient seen for medical followup. Refer to prior note. He has history of remote CVA, hypertension, chronic anxiety. He is concerned about some short-term memory issues. He sometimes has difficulty remembering things like names. Distant memory is intact. Had thyroid function last spring normal. May have had B12 checked by neurologist but we have no records.  Hypertension generally well-controlled. No orthostasis. He takes Lotrel. Compliant with therapy. No chest pains. No syncope. Anxiety symptoms controlled on clonazepam 1 mg one half tablet twice daily and sertraline 100 mg daily. He is reluctant to taper further. Overall feels anxiety is relatively stable at this time. He has some chronic fatigue which is unchanged.  Past Medical History  Diagnosis Date  . Hypertension   . Anxiety   . BPH (benign prostatic hyperplasia)    Past Surgical History  Procedure Date  . Cardiac catheterization     Negative / 4 years ago    reports that he has quit smoking. He has never used smokeless tobacco. He reports that he drinks alcohol. He reports that he does not use illicit drugs. family history is not on file. No Known Allergies    Review of Systems  Constitutional: Positive for fatigue.  Eyes: Negative for visual disturbance.  Respiratory: Negative for cough, chest tightness and shortness of breath.   Cardiovascular: Negative for chest pain, palpitations and leg swelling.  Neurological: Negative for dizziness, syncope, weakness, light-headedness and headaches.  Psychiatric/Behavioral: Negative for dysphoric mood and agitation. The patient is nervous/anxious.        Objective:   Physical Exam  Constitutional: He is oriented to person, place, and time. He appears well-developed and well-nourished. No distress.  Neck: Neck supple. No thyromegaly present.  Cardiovascular: Normal rate  and regular rhythm.   Pulmonary/Chest: Effort normal and breath sounds normal. No respiratory distress. He has no wheezes. He has no rales.  Musculoskeletal: He exhibits no edema.  Neurological: He is alert and oriented to person, place, and time.       Patient was able to perform 3 objext short-term recall. Judgment intact. He is able to name day of week, year, and date.  Psychiatric: He has a normal mood and affect. His behavior is normal. Judgment and thought content normal.          Assessment & Plan:  #1 Hypertension. Stable. Standing blood pressure 140/80. Continue current medication #2 BPH stable. Refilled jalyn for one year #3 chronic anxiety. Refill Clonazepam for 6 months  #4 mild cognitive impairment. Check B12 level. Recent thyroid function normal. Consider Mini-Mental Status exam in 6 months

## 2012-10-30 NOTE — Progress Notes (Signed)
Quick Note:  Pt informed ______ 

## 2013-04-24 ENCOUNTER — Telehealth: Payer: Self-pay | Admitting: *Deleted

## 2013-04-24 MED ORDER — CLONAZEPAM 1 MG PO TABS: ORAL_TABLET | ORAL | Status: DC

## 2013-04-24 NOTE — Telephone Encounter (Signed)
Refill for 6 months. 

## 2013-04-24 NOTE — Telephone Encounter (Signed)
Clonazepam 1 mg, take 1/2 tab twice daily, last filled 10-28-13 with 5 refills, pt has OV scheduled for 04/28/13

## 2013-04-28 ENCOUNTER — Ambulatory Visit (INDEPENDENT_AMBULATORY_CARE_PROVIDER_SITE_OTHER): Payer: Medicare PPO | Admitting: Family Medicine

## 2013-04-28 ENCOUNTER — Encounter: Payer: Self-pay | Admitting: Family Medicine

## 2013-04-28 VITALS — BP 130/80 | Temp 98.8°F | Wt 188.0 lb

## 2013-04-28 DIAGNOSIS — J309 Allergic rhinitis, unspecified: Secondary | ICD-10-CM

## 2013-04-28 DIAGNOSIS — F5104 Psychophysiologic insomnia: Secondary | ICD-10-CM

## 2013-04-28 DIAGNOSIS — K130 Diseases of lips: Secondary | ICD-10-CM

## 2013-04-28 DIAGNOSIS — F411 Generalized anxiety disorder: Secondary | ICD-10-CM

## 2013-04-28 DIAGNOSIS — N401 Enlarged prostate with lower urinary tract symptoms: Secondary | ICD-10-CM

## 2013-04-28 DIAGNOSIS — N138 Other obstructive and reflux uropathy: Secondary | ICD-10-CM

## 2013-04-28 DIAGNOSIS — G4700 Insomnia, unspecified: Secondary | ICD-10-CM

## 2013-04-28 DIAGNOSIS — I10 Essential (primary) hypertension: Secondary | ICD-10-CM

## 2013-04-28 MED ORDER — ALPRAZOLAM 1 MG PO TABS: 1.0000 mg | ORAL_TABLET | Freq: Two times a day (BID) | ORAL | Status: DC | PRN

## 2013-04-28 MED ORDER — FLUTICASONE PROPIONATE 50 MCG/ACT NA SUSP: 2.0000 | Freq: Every day | NASAL | Status: AC

## 2013-04-28 MED ORDER — CLOTRIMAZOLE-BETAMETHASONE 1-0.05 % EX CREA: TOPICAL_CREAM | Freq: Two times a day (BID) | CUTANEOUS | Status: DC

## 2013-04-28 NOTE — Progress Notes (Signed)
  Subjective:    Patient ID: Micheal Nicholson, male    DOB: [birth year], 79 y.o.   MRN: [id number]  HPI Patient seen for followup regarding multiple items as below  Had some recent congestion nasally especially left nostril. He thinks this is mostly related to pollen and allergies Denies any purulent secretions or fever or chills  History of hypertension treated with amlodipine benazepril. No dizziness. No recent headaches. No chest pains. Compliant with therapy.  Long history of chronic anxiety. He complains of tremendous difficulty sleeping. For many years he took alprazolam and this was switched last year clonazepam. We tried tapering him off benzodiazepines altogether and he has been completely resistant. He feels that both his anxiety symptoms were improved alprazolam as well as sleep. He is very stable never had any falls. He takes sertraline 100 mg daily on regular basis. He denies any current depressive symptoms.  Patient has sores corner of mouth bilaterally. Tried some type of topical over-the-counter antibiotic without improvement  Past Medical History  Diagnosis Date  . Hypertension   . Anxiety   . BPH (benign prostatic hyperplasia)    Past Surgical History  Procedure Laterality Date  . Cardiac catheterization      Negative / 4 years ago    reports that he has quit smoking. He has never used smokeless tobacco. He reports that  drinks alcohol. He reports that he does not use illicit drugs. family history is not on file. No Known Allergies    Review of Systems  Constitutional: Negative for fever and chills.  HENT: Positive for congestion and postnasal drip.   Respiratory: Negative for shortness of breath and wheezing.   Cardiovascular: Negative for chest pain, palpitations and leg swelling.  Gastrointestinal: Negative for abdominal pain.  Neurological: Negative for dizziness, syncope and headaches.  Psychiatric/Behavioral: Positive for sleep disturbance.  Negative for dysphoric mood. The patient is nervous/anxious.        Objective:   Physical Exam  Constitutional: He is oriented to person, place, and time. He appears well-developed and well-nourished.  HENT:  Right Ear: External ear normal.  Left Ear: External ear normal.  Nasal mucosa reveals mild erythema and some turbinate swelling bilaterally  Neck: Neck supple. No thyromegaly present.  Cardiovascular: Normal rate and regular rhythm.   Pulmonary/Chest: Effort normal and breath sounds normal. No respiratory distress. He has no wheezes. He has no rales.  Musculoskeletal: He exhibits no edema.  Lymphadenopathy:    He has no cervical adenopathy.  Neurological: He is alert and oriented to person, place, and time. No cranial nerve deficit.  Skin:  Patient has bilateral angular cheilitis with mild erythema and very superficial linear fissures corner of mouth bilaterally  Psychiatric: He has a normal mood and affect. His behavior is normal.          Assessment & Plan:  #1 hypertension well controlled. Continue current medication #2 chronic insomnia. Sleep hygiene discussed #3 allergic rhinitis, seasonal. Flonase nasal 2 sprays per nostril once daily  #4 angular cheilitis.  Leave off topical antibiotics. Lotrisone cream twice daily and reassess in 3 weeks #5 chronic anxiety. Patient requesting change back to alprazolam. We had a long discussion regarding risk and benefits of benzodiazepines especially in elderly. He is very reluctant to make changes. We have tried multiple times previously tapering without success

## 2013-05-20 ENCOUNTER — Encounter: Payer: Self-pay | Admitting: Family Medicine

## 2013-05-20 ENCOUNTER — Ambulatory Visit (INDEPENDENT_AMBULATORY_CARE_PROVIDER_SITE_OTHER): Payer: Medicare PPO | Admitting: Family Medicine

## 2013-05-20 VITALS — BP 120/80 | Temp 98.3°F | Wt 184.0 lb

## 2013-05-20 DIAGNOSIS — I10 Essential (primary) hypertension: Secondary | ICD-10-CM

## 2013-05-20 DIAGNOSIS — F5104 Psychophysiologic insomnia: Secondary | ICD-10-CM

## 2013-05-20 DIAGNOSIS — G4700 Insomnia, unspecified: Secondary | ICD-10-CM

## 2013-05-20 DIAGNOSIS — N401 Enlarged prostate with lower urinary tract symptoms: Secondary | ICD-10-CM

## 2013-05-20 DIAGNOSIS — F411 Generalized anxiety disorder: Secondary | ICD-10-CM

## 2013-05-20 NOTE — Progress Notes (Signed)
  Subjective:    Patient ID: Micheal Nicholson, male    DOB: [birth year], 79 y.o.   MRN: [id number]  HPI  Medical followup. Patient had angular cheilitis.  Treated with Lotrisone and essentially resolved History of chronic anxiety and chronic insomnia. He takes alprazolam at night. Sleeping fairly well. As per previous note, we've tried multiple times tapering this without success  Continues to have some intermittent BPH symptoms. He takes jalyn regularly. He does not feel symptoms are severe enough at this point to see urologist. Only occasional nocturia.  Hypertension has been well controlled with amlodipine and enalapril combination  Past Medical History  Diagnosis Date  . Hypertension   . Anxiety   . BPH (benign prostatic hyperplasia)    Past Surgical History  Procedure Laterality Date  . Cardiac catheterization      Negative / 4 years ago    reports that he has quit smoking. He has never used smokeless tobacco. He reports that  drinks alcohol. He reports that he does not use illicit drugs. family history is not on file. No Known Allergies    Review of Systems  Constitutional: Negative for chills, fatigue and unexpected weight change.  Eyes: Negative for visual disturbance.  Respiratory: Negative for cough, chest tightness and shortness of breath.   Cardiovascular: Negative for chest pain, palpitations and leg swelling.  Neurological: Negative for dizziness, syncope, weakness, light-headedness and headaches.       Objective:   Physical Exam  Constitutional: He appears well-developed and well-nourished.  HENT:  Mouth/Throat: Oropharynx is clear and moist.  Angular cheilitis from last visit cleared.  Neck: Neck supple. No thyromegaly present.  Cardiovascular: Normal rate and regular rhythm.   Pulmonary/Chest: Effort normal and breath sounds normal. No respiratory distress. He has no wheezes. He has no rales.  Musculoskeletal: He exhibits no edema.           Assessment & Plan:  #1 hypertension. Well controlled. Continue current medication #2 Angular cheilitis. Improved with Lotrisone cream #3 BPH. Remains symptomatic somewhat intermittently. We discussed urology referral at this point he does not wish to pursue. #4 chronic insomnia.  Unchanged.  We have discussed sleep hygiene extensively in past.

## 2013-06-13 ENCOUNTER — Ambulatory Visit (INDEPENDENT_AMBULATORY_CARE_PROVIDER_SITE_OTHER): Payer: Medicare PPO | Admitting: Family Medicine

## 2013-06-13 ENCOUNTER — Encounter: Payer: Self-pay | Admitting: Family Medicine

## 2013-06-13 VITALS — BP 122/82 | Temp 97.8°F | Wt 182.0 lb

## 2013-06-13 DIAGNOSIS — R233 Spontaneous ecchymoses: Secondary | ICD-10-CM

## 2013-06-13 LAB — CBC WITH DIFFERENTIAL/PLATELET
Basophils Absolute: 0 10*3/uL (ref 0.0–0.1)
Eosinophils Relative: 1.2 % (ref 0.0–5.0)
HCT: 45.9 % (ref 39.0–52.0)
Lymphocytes Relative: 31.4 % (ref 12.0–46.0)
Lymphs Abs: 1.7 10*3/uL (ref 0.7–4.0)
Monocytes Relative: 8.3 % (ref 3.0–12.0)
Neutrophils Relative %: 58.8 % (ref 43.0–77.0)
Platelets: 190 10*3/uL (ref 150.0–400.0)
RDW: 12.7 % (ref 11.5–14.6)
WBC: 5.4 10*3/uL (ref 4.5–10.5)

## 2013-06-13 NOTE — Progress Notes (Signed)
  Subjective:    Patient ID: Micheal Nicholson, male    DOB: 02-06-1920, 79 y.o.   MRN: [id number]  HPI 3 day history of nontraumatic ecchymoses on both forearms Patient takes aspirin 325 mg daily but has not had significant bruising previously He denies any bruising involving the lower extremities or trunk region or anywhere else He has not had any bleeding from the gums or hematuria or melena or hematemesis Denies any recent trauma.  Otherwise feels well. Blood pressures been very well controlled. He does not take any other blood thinners.  Past Medical History  Diagnosis Date  . Hypertension   . Anxiety   . BPH (benign prostatic hyperplasia)    Past Surgical History  Procedure Laterality Date  . Cardiac catheterization      Negative / 4 years ago    reports that he has quit smoking. He has never used smokeless tobacco. He reports that  drinks alcohol. He reports that he does not use illicit drugs. family history is not on file. No Known Allergies    Review of Systems  Constitutional: Negative for appetite change, fatigue and unexpected weight change.  Eyes: Negative for visual disturbance.  Respiratory: Negative for cough, chest tightness and shortness of breath.   Cardiovascular: Negative for chest pain, palpitations and leg swelling.  Neurological: Negative for dizziness, syncope, weakness, light-headedness and headaches.  Hematological: Negative for adenopathy. Bruises/bleeds easily.       Objective:   Physical Exam  Constitutional: He appears well-developed and well-nourished.  Cardiovascular: Normal rate and regular rhythm.   Pulmonary/Chest: Effort normal and breath sounds normal. No respiratory distress. He has no wheezes. He has no rales.  Musculoskeletal: He exhibits no edema.  Skin:  Patient has several scattered ecchymoses involving both forearms right greater than left          Assessment & Plan:  Nontraumatic ecchymoses. Suspect related to age  and aspirin. Doubt thrombocytopenia but check CBC to be safe

## 2013-06-18 ENCOUNTER — Other Ambulatory Visit: Payer: Self-pay | Admitting: Family Medicine

## 2013-06-18 NOTE — Telephone Encounter (Signed)
Request for Zoloft Last Rx: 06.20.13, #30x11 Last OV: 06.20.14 Rx sent to pharmacy per [facility] refill protocol/SLS

## 2013-09-08 ENCOUNTER — Other Ambulatory Visit: Payer: Self-pay | Admitting: Family Medicine

## 2013-09-23 ENCOUNTER — Other Ambulatory Visit: Payer: Self-pay | Admitting: Family Medicine

## 2013-10-15 ENCOUNTER — Ambulatory Visit (INDEPENDENT_AMBULATORY_CARE_PROVIDER_SITE_OTHER)
Admission: RE | Admit: 2013-10-15 | Discharge: 2013-10-15 | Disposition: A | Discharge status: Home / Self Care | Payer: Medicare PPO | Source: Ambulatory Visit | Attending: Family Medicine | Admitting: Family Medicine

## 2013-10-15 ENCOUNTER — Ambulatory Visit (INDEPENDENT_AMBULATORY_CARE_PROVIDER_SITE_OTHER): Payer: Medicare PPO | Admitting: Family Medicine

## 2013-10-15 ENCOUNTER — Encounter: Payer: Self-pay | Admitting: Family Medicine

## 2013-10-15 VITALS — BP 140/88 | HR 77 | Temp 97.4°F | Wt 185.0 lb

## 2013-10-15 DIAGNOSIS — M545 Low back pain, unspecified: Secondary | ICD-10-CM

## 2013-10-15 DIAGNOSIS — N401 Enlarged prostate with lower urinary tract symptoms: Secondary | ICD-10-CM

## 2013-10-15 NOTE — Progress Notes (Addendum)
  Subjective:    Patient ID: Micheal Nicholson, male    DOB: 09/12/1920, 79 y.o.   MRN: [id number]  HPI  Patient here to discuss the following:  History of BPH. He currently takes [place] and though he has not seen progression of obstructive symptoms has not seen great improvement. He is describing some urinary urgency. His symptoms are somewhat intermittent. He occasionally has nocturia. Usually able to empty his bladder. No burning with urination. He is not interested in further intervention with urology at this time. Symptoms are relatively mild.  Patient is complaining of some persistent pains right hip region and right lumbar back. Present for many months. No appetite or weight changes. No fevers or chills. No night sweats. Pain is worse with position change and ambulation but not at rest. He's not describing any bilateral radiculopathy symptoms. No right sided radiculopathy symptoms. No numbness or weakness. Still walks some for exercise. No recent x-rays. No recent fall or injury. He saw a chiropractor but did not see any improvement with treatment. Has taken Tylenol without improvement  Past Medical History  Diagnosis Date  . Hypertension   . Anxiety   . BPH (benign prostatic hyperplasia)    Past Surgical History  Procedure Laterality Date  . Cardiac catheterization      Negative / 4 years ago    reports that he has quit smoking. He has never used smokeless tobacco. He reports that he drinks alcohol. He reports that he does not use illicit drugs. family history is not on file. No Known Allergies   Review of Systems  Constitutional: Negative for fever, activity change, appetite change and unexpected weight change.  Respiratory: Negative for cough and shortness of breath.   Cardiovascular: Negative for chest pain and leg swelling.  Gastrointestinal: Negative for vomiting and abdominal pain.  Genitourinary: Negative for dysuria, hematuria and flank pain.  Musculoskeletal:  Positive for back pain. Negative for joint swelling.  Neurological: Negative for weakness and numbness.       Objective:   Physical Exam  Constitutional: He appears well-developed and well-nourished.  Cardiovascular: Normal rate and regular rhythm.   Pulmonary/Chest: Effort normal and breath sounds normal. No respiratory distress. He has no wheezes. He has no rales.  Musculoskeletal: He exhibits no edema.  Patient has good range of motion right hip. No localized tenderness. Straight leg raise is negative.  Neurological:  Patient's full-strength lower extremities. 2+ symmetric reflexes lower extremities  Psychiatric: He has a normal mood and affect. His behavior is normal.          Assessment & Plan:  #1 BPH. Symptomatically stable. We discussed possible urology referral and he is not interested at this time #2 right lumbar back pain with some radiation toward her right iliac crest region. Suspect nerve impingement. He is not describing any abdominal pain and abdominal exam is unremarkable. He does not have any red flags such as appetite or weight changes. Check CBC, basic metabolic panel, alkaline phosphatase, sed rate. Obtain plain x-rays lumbosacral spine. Consider orthopedic consult. Patient already had chiropractic treatment without improvement.  X-rays reveal diffuse degenerative changes lumbar spine. Patient requesting referral to orthopedist. We'll set up

## 2013-10-16 NOTE — Addendum Note (Signed)
Addended by: Kristian Covey on: 10/16/2013 05:41 PM   Modules accepted: Orders

## 2013-10-20 ENCOUNTER — Other Ambulatory Visit: Payer: Self-pay | Admitting: Family Medicine

## 2013-10-27 ENCOUNTER — Other Ambulatory Visit: Payer: Self-pay | Admitting: Family Medicine

## 2013-10-27 NOTE — Telephone Encounter (Signed)
Xanax  Last refill 04/28/13 #60 5 refill

## 2013-10-27 NOTE — Telephone Encounter (Signed)
Refill for 6 months. 

## 2013-11-10 ENCOUNTER — Telehealth: Payer: Self-pay | Admitting: Family Medicine

## 2013-11-10 ENCOUNTER — Encounter: Payer: Self-pay | Admitting: Family Medicine

## 2013-11-10 ENCOUNTER — Ambulatory Visit (INDEPENDENT_AMBULATORY_CARE_PROVIDER_SITE_OTHER): Payer: Medicare PPO | Admitting: Family Medicine

## 2013-11-10 VITALS — BP 128/74 | HR 85 | Temp 98.8°F | Wt 181.0 lb

## 2013-11-10 DIAGNOSIS — R6883 Chills (without fever): Secondary | ICD-10-CM

## 2013-11-10 DIAGNOSIS — R1110 Vomiting, unspecified: Secondary | ICD-10-CM

## 2013-11-10 LAB — POCT URINALYSIS DIPSTICK
Bilirubin, UA: NEGATIVE
Ketones, UA: NEGATIVE
Leukocytes, UA: NEGATIVE
Nitrite, UA: NEGATIVE
Protein, UA: NEGATIVE
pH, UA: 6

## 2013-11-10 NOTE — Progress Notes (Signed)
Pre visit review using our clinic review tool, if applicable. No additional management support is needed unless otherwise documented below in the visit note. 

## 2013-11-10 NOTE — Telephone Encounter (Signed)
Patient Information:  Caller Name: Gene  Phone: [phone]  Patient: Micheal Nicholson, Micheal Nicholson  Gender: Male  DOB: June 29, 1920  Age: 79 Years  PCP: Evelena Peat (Family Practice)  Office Follow Up:  Does the office need to follow up with this patient?: No  Instructions For The Office: N/A  RN Note:  Is taking Tylenol and drinking fluids/passing urine.  Caller believes father is fine now, but wants and appointment as soon as possible since his father is nervous about the episode as is the son.  Triaged with no emergent symptoms assessed.  Vomiting episode happened after drinking some Buttermilk. Afebrile.  Appointment scheduled for today at 16:30 with Dr. Caryl Never as requested.  Symptoms  Reason For Call & Symptoms: Had back injection for pain last 11/12.  Onset of vomiting started today.  Vomited x1 with no diarrhea-at 13:00.  Is having chills  Reviewed Health History In EMR: Yes  Reviewed Medications In EMR: Yes  Reviewed Allergies In EMR: Yes  Reviewed Surgeries / Procedures: Yes  Date of Onset of Symptoms: 11/10/2013  Guideline(s) Used:  Vomiting  Disposition Per Guideline:   See Today in Office  Reason For Disposition Reached:   Patient wants to be seen  Advice Given:  Call Back If:  You become worse.  Patient Will Follow Care Advice:  YES  Appointment Scheduled:  11/10/2013 16:30:00 Appointment Scheduled Provider:  Evelena Peat [facility])

## 2013-11-10 NOTE — Progress Notes (Signed)
  Subjective:    Patient ID: Micheal Nicholson, male    DOB: [birth year], 79 y.o.   MRN: [id number]  HPI Patient is a 79 year old with history of hypertension, BPH, GERD, chronic anxiety He is seen today for acute visit with onset this morning of nausea. He drank some buttermilk and subsequently had one episode of vomiting. None since then, no recurrent nausea. He denies any abdominal pain or fever. No chest pain. No diarrhea symptoms and no constipation  He's had some significant allergies and takes Flonase regularly. Has had some recent postnasal drip symptoms. He also related episode this morning of chills but no documented fever. He denies any burning with urination. He has slow stream which has been chronic related to BPH. Occasional dry cough but this is chronic and unchanged.  Denies any other source of fever/chills such as skin rash, sore throat,etc  Past Medical History  Diagnosis Date  . Hypertension   . Anxiety   . BPH (benign prostatic hyperplasia)    Past Surgical History  Procedure Laterality Date  . Cardiac catheterization      Negative / 4 years ago    reports that he has quit smoking. He has never used smokeless tobacco. He reports that he drinks alcohol. He reports that he does not use illicit drugs. family history is not on file. No Known Allergies    Review of Systems  Constitutional: Positive for chills. Negative for fever, appetite change and unexpected weight change.  Respiratory: Negative for shortness of breath and wheezing.   Cardiovascular: Negative for chest pain, palpitations and leg swelling.  Gastrointestinal: Negative for abdominal pain and diarrhea.  Genitourinary: Negative for dysuria.  Skin: Negative for rash.  Neurological: Negative for headaches.       Objective:   Physical Exam  Constitutional: He appears well-developed and well-nourished.  HENT:  Right Ear: External ear normal.  Left Ear: External ear normal.  Mouth/Throat:  Oropharynx is clear and moist.  Neck: Neck supple. No thyromegaly present.  Cardiovascular: Normal rate and regular rhythm.  Exam reveals no gallop.   Pulmonary/Chest: Effort normal and breath sounds normal. No respiratory distress. He has no wheezes. He has no rales.  Abdominal: Soft. He exhibits no mass. There is no tenderness. There is no rebound and no guarding.  Musculoskeletal: He exhibits no edema.  Lymphadenopathy:    He has no cervical adenopathy.  Skin: No rash noted.          Assessment & Plan:  Patient presents with nonspecific symptoms of chills without documented fever and one episode of vomiting earlier today. None since then. He is currently afebrile. Urine dipstick is normal. Obtain further labs with CBC, basic metabolic panel, hepatic panel. Would recommend observation this time and follow up promptly for any recurrent symptoms or new symptoms. We've also encouraged him to take temperature if he has any further episodes of chills

## 2013-11-10 NOTE — Patient Instructions (Signed)
Take temperature if you have any recurrent chills Follow up for any recurrent vomiting or any new symptoms.

## 2013-11-11 LAB — BASIC METABOLIC PANEL
BUN: 15 mg/dL (ref 6–23)
Calcium: 9.6 mg/dL (ref 8.4–10.5)
Creatinine, Ser: 1.1 mg/dL (ref 0.4–1.5)
GFR: 65.91 mL/min (ref >60.00)
Glucose, Bld: 116 mg/dL — ABNORMAL HIGH (ref 70–99)

## 2013-11-11 LAB — CBC WITH DIFFERENTIAL/PLATELET
Basophils Relative: 0.6 % (ref 0.0–3.0)
Eosinophils Absolute: 0 10*3/uL (ref 0.0–0.7)
Eosinophils Relative: 0.1 % (ref 0.0–5.0)
HCT: 45.4 % (ref 39.0–52.0)
Hemoglobin: 15.6 g/dL (ref 13.0–17.0)
MCHC: 34.3 g/dL (ref 30.0–36.0)
MCV: 93.2 fl (ref 78.0–100.0)
Monocytes Absolute: 0.5 10*3/uL (ref 0.1–1.0)
Neutro Abs: 5.5 10*3/uL (ref 1.4–7.7)
RBC: 4.88 Mil/uL (ref 4.22–5.81)

## 2013-11-11 LAB — HEPATIC FUNCTION PANEL
ALT: 15 U/L (ref 0–53)
Albumin: 4.5 g/dL (ref 3.5–5.2)
Total Bilirubin: 1.4 mg/dL — ABNORMAL HIGH (ref 0.3–1.2)

## 2014-01-29 ENCOUNTER — Telehealth: Payer: Self-pay | Admitting: Family Medicine

## 2014-01-29 DIAGNOSIS — M545 Low back pain, unspecified: Secondary | ICD-10-CM

## 2014-01-29 NOTE — Telephone Encounter (Signed)
Patient is requesting a referral in order to get a shot in his hip. He called the orthopedic clinic and was told he had to get it done (referral) through his PCP, so at this point no appt has been scheduled. Please call patient once it is done. Dr. Nelva Bush @ [facility]. Thanks!

## 2014-01-30 NOTE — Telephone Encounter (Signed)
Ok to refer.

## 2014-02-02 NOTE — Telephone Encounter (Signed)
Referral is ordered

## 2014-03-11 ENCOUNTER — Ambulatory Visit (INDEPENDENT_AMBULATORY_CARE_PROVIDER_SITE_OTHER): Payer: Medicare PPO | Admitting: Family Medicine

## 2014-03-11 ENCOUNTER — Encounter: Payer: Self-pay | Admitting: Family Medicine

## 2014-03-11 VITALS — BP 132/80 | HR 70 | Temp 97.6°F | Wt 174.0 lb

## 2014-03-11 DIAGNOSIS — J310 Chronic rhinitis: Secondary | ICD-10-CM

## 2014-03-11 NOTE — Progress Notes (Signed)
   Subjective:    Patient ID: Micheal Nicholson, male    DOB: Jun 01, 1920, 80 y.o.   MRN: [id number]  Sinus Problem Associated symptoms include congestion and sinus pressure. Pertinent negatives include no chills.   Patient seen with sinus congestion symptoms. He describes typical type cold about 2 weeks ago. Now has some persistent nasal congestion. He does not describe any colored nasal discharge, facial pain, or recent headaches. He has occasional dry cough. Continues to walk for exercise. No fever or chills. No increased malaise. He tried over-the-counter Afrin nasal spray.  Past Medical History  Diagnosis Date  . Hypertension   . Anxiety   . BPH (benign prostatic hyperplasia)    Past Surgical History  Procedure Laterality Date  . Cardiac catheterization      Negative / 4 years ago    reports that he has quit smoking. He has never used smokeless tobacco. He reports that he drinks alcohol. He reports that he does not use illicit drugs. family history is not on file. No Known Allergies    Review of Systems  Constitutional: Negative for fever, chills, appetite change and unexpected weight change.  HENT: Positive for congestion and sinus pressure.   Cardiovascular: Negative for chest pain.       Objective:   Physical Exam  Constitutional: He appears well-developed and well-nourished.  HENT:  Left Ear: External ear normal.  Cerumen impaction right canal  Neck: Neck supple.  Cardiovascular: Normal rate and regular rhythm.   Pulmonary/Chest: Breath sounds normal. No respiratory distress. He has no wheezes. He has no rales.  Lymphadenopathy:    He has no cervical adenopathy.          Assessment & Plan:  Rhinitis. Question of viral URI still lingering versus allergic. Trial of Flonase or Nasacort over-the-counter. No indication for antibiotic at this time. Avoid prolonged use of Afrin nasal spray

## 2014-03-11 NOTE — Progress Notes (Signed)
Pre visit review using our clinic review tool, if applicable. No additional management support is needed unless otherwise documented below in the visit note. 

## 2014-03-11 NOTE — Patient Instructions (Signed)
Try Flonase or Nasacort for nasal congestive symptoms.

## 2014-04-09 ENCOUNTER — Other Ambulatory Visit: Payer: Self-pay | Admitting: Family Medicine

## 2014-04-15 ENCOUNTER — Other Ambulatory Visit: Payer: Self-pay | Admitting: Family Medicine

## 2014-04-15 NOTE — Telephone Encounter (Signed)
Refill for 6 months. 

## 2014-04-15 NOTE — Telephone Encounter (Signed)
Last visit 03/11/14 Last refill 10/27/13 #60 5 refills

## 2014-05-14 ENCOUNTER — Telehealth: Payer: Self-pay | Admitting: Family Medicine

## 2014-05-14 NOTE — Telephone Encounter (Addendum)
Per [facility] pt scheduled for 05-14-2014 need referral  Faxed a referral to [facility] for pt to see  [facility] Dr Nelva Bush for back pain  [address], [address] [phone] Awaiting authorization    Received  An authorization [id number] start date - [id number] end date 08-12-2014 Micheal Nicholson

## 2014-07-18 ENCOUNTER — Emergency Department (HOSPITAL_COMMUNITY): Payer: Medicare PPO

## 2014-07-18 ENCOUNTER — Emergency Department (HOSPITAL_COMMUNITY)
Admission: EM | Admit: 2014-07-18 | Discharge: 2014-07-18 | Disposition: A | Discharge status: Home / Self Care | Payer: Medicare PPO | Attending: Emergency Medicine | Admitting: Emergency Medicine

## 2014-07-18 ENCOUNTER — Encounter (HOSPITAL_COMMUNITY): Payer: Self-pay | Admitting: Emergency Medicine

## 2014-07-18 DIAGNOSIS — Z7982 Long term (current) use of aspirin: Secondary | ICD-10-CM | POA: Diagnosis not present

## 2014-07-18 DIAGNOSIS — R5381 Other malaise: Secondary | ICD-10-CM | POA: Diagnosis present

## 2014-07-18 DIAGNOSIS — R6883 Chills (without fever): Secondary | ICD-10-CM | POA: Diagnosis not present

## 2014-07-18 DIAGNOSIS — Z79899 Other long term (current) drug therapy: Secondary | ICD-10-CM | POA: Insufficient documentation

## 2014-07-18 DIAGNOSIS — I4891 Unspecified atrial fibrillation: Secondary | ICD-10-CM | POA: Diagnosis not present

## 2014-07-18 DIAGNOSIS — I10 Essential (primary) hypertension: Secondary | ICD-10-CM | POA: Diagnosis not present

## 2014-07-18 DIAGNOSIS — G9332 Myalgic encephalomyelitis/chronic fatigue syndrome: Secondary | ICD-10-CM | POA: Diagnosis not present

## 2014-07-18 DIAGNOSIS — Z9889 Other specified postprocedural states: Secondary | ICD-10-CM | POA: Insufficient documentation

## 2014-07-18 DIAGNOSIS — IMO0002 Reserved for concepts with insufficient information to code with codable children: Secondary | ICD-10-CM | POA: Diagnosis not present

## 2014-07-18 DIAGNOSIS — F411 Generalized anxiety disorder: Secondary | ICD-10-CM | POA: Diagnosis not present

## 2014-07-18 DIAGNOSIS — Z87891 Personal history of nicotine dependence: Secondary | ICD-10-CM | POA: Diagnosis not present

## 2014-07-18 DIAGNOSIS — N400 Enlarged prostate without lower urinary tract symptoms: Secondary | ICD-10-CM | POA: Insufficient documentation

## 2014-07-18 DIAGNOSIS — I4819 Other persistent atrial fibrillation: Secondary | ICD-10-CM

## 2014-07-18 DIAGNOSIS — R5382 Chronic fatigue, unspecified: Secondary | ICD-10-CM

## 2014-07-18 DIAGNOSIS — R5383 Other fatigue: Secondary | ICD-10-CM

## 2014-07-18 LAB — BASIC METABOLIC PANEL
ANION GAP: 18 — AB (ref 5–15)
BUN: 12 mg/dL (ref 6–23)
CALCIUM: 9 mg/dL (ref 8.4–10.5)
CHLORIDE: 102 meq/L (ref 96–112)
CO2: 22 meq/L (ref 19–32)
CREATININE: 0.97 mg/dL (ref 0.50–1.35)
GFR calc Af Amer: 81 mL/min — ABNORMAL LOW (ref >90)
GFR calc non Af Amer: 69 mL/min — ABNORMAL LOW (ref >90)
Glucose, Bld: 100 mg/dL — ABNORMAL HIGH (ref 70–99)
Potassium: 3.9 mEq/L (ref 3.7–5.3)
Sodium: 142 mEq/L (ref 137–147)

## 2014-07-18 LAB — URINALYSIS, ROUTINE W REFLEX MICROSCOPIC
BILIRUBIN URINE: NEGATIVE
Glucose, UA: NEGATIVE mg/dL
Hgb urine dipstick: NEGATIVE
Ketones, ur: NEGATIVE mg/dL
Leukocytes, UA: NEGATIVE
Nitrite: NEGATIVE
PROTEIN: NEGATIVE mg/dL
Specific Gravity, Urine: 1.011 (ref 1.005–1.030)
UROBILINOGEN UA: 1 mg/dL (ref 0.0–1.0)
pH: 6.5 (ref 5.0–8.0)

## 2014-07-18 LAB — CBC
HEMATOCRIT: 45 % (ref 39.0–52.0)
Hemoglobin: 15.9 g/dL (ref 13.0–17.0)
MCH: 31.5 pg (ref 26.0–34.0)
MCHC: 35.3 g/dL (ref 30.0–36.0)
MCV: 89.1 fL (ref 78.0–100.0)
Platelets: 148 10*3/uL — ABNORMAL LOW (ref 150–400)
RBC: 5.05 MIL/uL (ref 4.22–5.81)
RDW: 13.7 % (ref 11.5–15.5)
WBC: 7.8 10*3/uL (ref 4.0–10.5)

## 2014-07-18 LAB — I-STAT TROPONIN, ED: Troponin i, poc: 0 ng/mL (ref 0.00–0.08)

## 2014-07-18 LAB — CBG MONITORING, ED: GLUCOSE-CAPILLARY: 100 mg/dL — AB (ref 70–99)

## 2014-07-18 MED ORDER — LORAZEPAM 1 MG PO TABS
1.0000 mg | ORAL_TABLET | Freq: Once | ORAL | Status: AC
  Administered 2014-07-18: 1 mg via ORAL
  Filled 2014-07-18: qty 1

## 2014-07-18 NOTE — Discharge Instructions (Signed)
Anticoagulation, Generic °Anticoagulants are medicines used to prevent clots from developing in your veins. These medicine are also known as blood thinners. If blood clots are untreated, they could travel to your lungs. This is called a pulmonary embolus. A blood clot in your lungs can be fatal.  °Health care providers often use anticoagulants to prevent clots following surgery. Anticoagulants are also used along with aspirin when the heart is not getting enough blood. °Another anticoagulant called warfarin is started 2 to 3 days after a rapid-acting injectable anticoagulant is started. The rapid-acting anticoagulants are usually continued until warfarin has begun to work. Your health care provider will judge this length of time by blood tests known as the prothrombin time (PT) and International Normalization Ratio (INR). This means that your blood is at the necessary and best level to prevent clots. °RISKS AND COMPLICATIONS °· If you have received recent epidural anesthesia, spinal anesthesia, or a spinal tap while receiving anticoagulants, you are at risk for developing a blood clot in or around the spine. This condition could result in long-term or permanent paralysis. °· Because anticoagulants thin your blood, severe bleeding may occur from any tissue or organ. Symptoms of the blood being too thin may include: °· Bleeding from the nose or gums that does not stop quickly. °· Blood in bowel movements which may appear as bright red, dark, or black tarry stools. °· Blood in the urine which may appear as pink, red, or brown urine. °· Unusual bruising or bruising easily. °· A cut that does not stop bleeding within 10 minutes. °· Vomiting blood or continuous nausea for more than 1 day. °· Coughing up blood. °· Broken blood vessels in your eye (subconjunctival hemorrhage). °· Abdominal or back pain with or without flank bruising. °· Sudden, severe headache. °· Sudden weakness or numbness of the face, arm, or leg,  especially on one side of the body. °· Sudden confusion. °· Trouble speaking (aphasia) or understanding. °· Sudden trouble seeing in one or both eyes. °· Sudden trouble walking. °· Dizziness. °· Loss of balance or coordination. °· Vaginal bleeding. °· Swelling or pain at an injection site. °· Superficial fat tissue death (necrosis) which may cause skin scarring. This is more common in women and may first present as pain in the waist, thighs, or buttocks. °· Fever. °· Too little anticoagulation continues to allow the risk for blood clots. °HOME CARE INSTRUCTIONS  °· Due to the complications of anticoagulants, it is very important that you take your anticoagulant as directed by your health care provider. Anticoagulants need to be taken exactly as instructed. Be sure you understand all your anticoagulant instructions. °· Keep all follow-up appointments with your health care provider as directed. It is very important to keep your appointments. Not keeping appointments could result in a chronic or permanent injury, pain, or disability. °· Warfarin. Your health care provider will advise you on the length of treatment (usually 3-6 months, sometimes lifelong). °· Take warfarin exactly as directed by your health care provider. It is recommended that you take your warfarin dose at the same time of the day. It is preferred that you take warfarin in the late afternoon. If you have been told to stop taking warfarin, do not resume taking warfarin until directed to do so by your health care provider. Follow your health care provider's instructions if you accidentally take an extra dose or miss a dose of warfarin. It is very important to take warfarin as directed since bleeding or blood   clots could result in chronic or permanent injury, pain, or disability. °· Too much and too little warfarin are both dangerous. Too much warfarin increases the risk of bleeding. Too little warfarin continues to allow the risk for blood clots. While  taking warfarin, you will need to have regular blood tests to measure your blood clotting time. These blood tests usually include both the prothrombin time (PT) and International Normalized Ratio (INR) tests. The PT and INR results allow your health care provider to adjust your dose of warfarin. The dose can change for many reasons. It is critically important that you have your PT and INR levels drawn exactly as directed. Your warfarin dose may stay the same or change depending on what the PT and INR results are. Be sure to follow up with your health care provider regarding your PT and INR test results and what your warfarin dosage should be. °· Many medicines can interfere with warfarin and affect the PT and INR results. You must tell your health care provider about any and all medicines you take, this includes all vitamins and supplements. Ask your health care provider before taking these. Prescription and over-the-counter medicine consistency is critical to warfarin management. It is important that potential interactions are checked before you start a new medicine. Be especially cautious with aspirin and anti-inflammatory medicines. Ask your health care provider before taking these. Medicines such as antibiotics and acid-reducing medicine can interact with warfarin and can cause an increased warfarin effect. Warfarin can also interfere with the effectiveness of medicines you are taking. Do not take or discontinue any prescribed or over-the-counter medicine except on the advice of your health care provider or pharmacist. °· Some vitamins, supplements, and herbal products interfere with the effectiveness of warfarin. Vitamin E may increase the anticoagulant effects of warfarin. Vitamin K may can cause warfarin to be less effective. Do not take or discontinue any vitamin, supplement, or herbal product except on the advice of your health care provider or pharmacist. °· Eat what you normally eat and keep the vitamin K  content of your diet consistent. Avoid major changes in your diet, or notify your health care provider before changing your diet. Suddenly getting a lot more vitamin K could cause your blood to clot too quickly. A sudden decrease in vitamin K intake could cause your blood to clot too slowly. These changes in vitamin K intake could lead to dangerous blood clots or to bleeding. To keep your vitamin K intake consistent, you must be aware of which foods contain moderate or high amounts of vitamin K. Some foods high in vitamin K include spinach, kale, broccoli, cabbage, greens, Brussels sprouts, asparagus, [place], coleslaw, parsley, and green tea. Arrange a visit with a dietitian to answer your questions. °· If you have a loss of appetite or get the stomach flu (viral gastroenteritis), talk to your health care provider as soon as possible. A decrease in your normal vitamin K intake can make you more sensitive to your usual dose of warfarin. °· Some medical conditions may increase your risk for bleeding while you are taking warfarin. A fever, diarrhea lasting more than a day, worsening heart failure, or worsening liver function are some medical conditions that could affect warfarin. Contact your health care provider if you have any of these medical conditions. °· Alcohol can change the body's ability to handle warfarin. It is best to avoid alcoholic drinks or consume only very small amounts while taking warfarin. Notify your health care provider if   you change your alcohol intake. A sudden increase in alcohol use can increase your risk of bleeding. Chronic alcohol use can cause warfarin to be less effective. °· Be careful not to cut yourself when using sharp objects or while shaving. °· Inform all your health care providers and your dentist that you take an anticoagulant. °· Limit physical activities or sports that could result in a fall or cause injury. Avoid contact sports. °· Wear medical alert jewelry or carry a  medical alert card. °SEEK IMMEDIATE MEDICAL CARE IF: °· You cough up blood. °· You have dark or black stools or there is bright red blood coming from your rectum. °· You vomit blood or have nausea for more than 1 day. °· You have blood in the urine or pink colored urine. °· You have unusual bruising or have increased bruising. °· You have bleeding from the nose or gums that does not stop quickly. °· You have a cut that does not stop bleeding within a 2-3 minutes. °· You have sudden weakness or numbness of the face, arm, or leg, especially on one side of the body. °· You have sudden confusion. °· You have trouble speaking (aphasia) or understanding. °· You have sudden trouble seeing in one or both eyes. °· You have sudden trouble walking. °· You have dizziness. °· You have a loss of balance or coordination. °· You have a sudden, severe headache. °· You have a serious fall or head injury, even if you are not bleeding. °· You have swelling or pain at an injection site. °· You have unexplained tenderness or pain in the abdomen, back, waist, thighs or buttocks. °· You have a fever. °Any of these symptoms may represent a serious problem that is an emergency. Do not wait to see if the symptoms will go away. Get medical help right away. Call your local emergency services (911 in [place] Do not drive yourself to the hospital. °Document Released: 12/11/2005 Document Revised: 12/16/2013 Document Reviewed: 07/15/2008 °[employer]® Patient Information ©2015 [employer]. This information is not intended to replace advice given to you by your health care provider. Make sure you discuss any questions you have with your health care provider. ° ° ° ° °Atrial Fibrillation °Atrial fibrillation is a type of irregular heart rhythm (arrhythmia). During atrial fibrillation, the upper chambers of the heart (atria) quiver continuously in a chaotic pattern. This causes an irregular and often rapid heart rate.  °Atrial fibrillation is the result  of the heart becoming overloaded with disorganized signals that tell it to beat. These signals are normally released one at a time by a part of the right atrium called the sinoatrial node. They then travel from the atria to the lower chambers of the heart (ventricles), causing the atria and ventricles to contract and pump blood as they pass. In atrial fibrillation, parts of the atria outside of the sinoatrial node also release these signals. This results in two problems. First, the atria receive so many signals that they do not have time to fully contract. Second, the ventricles, which can only receive one signal at a time, beat irregularly and out of rhythm with the atria.  °There are three types of atrial fibrillation:  °· Paroxysmal. Paroxysmal atrial fibrillation starts suddenly and stops on its own within a week. °· Persistent. Persistent atrial fibrillation lasts for more than a week. It may stop on its own or with treatment. °· Permanent. Permanent atrial fibrillation does not go away. Episodes of atrial fibrillation may   lead to permanent atrial fibrillation. °Atrial fibrillation can prevent your heart from pumping blood normally. It increases your risk of stroke and can lead to heart failure.  °CAUSES  °· Heart conditions, including a heart attack, heart failure, coronary artery disease, and heart valve conditions.   °· Inflammation of the sac that surrounds the heart (pericarditis). °· Blockage of an artery in the lungs (pulmonary embolism). °· Pneumonia or other infections. °· Chronic lung disease. °· Thyroid problems, especially if the thyroid is overactive (hyperthyroidism). °· Caffeine, excessive alcohol use, and use of some illegal drugs.   °· Use of some medicines, including certain decongestants and diet pills. °· Heart surgery.   °· Birth defects.   °Sometimes, no cause can be found. When this happens, the atrial fibrillation is called lone atrial fibrillation. The risk of complications from atrial  fibrillation increases if you have lone atrial fibrillation and you are age 60 years or older. °RISK FACTORS °· Heart failure. °· Coronary artery disease. °· Diabetes mellitus.   °· High blood pressure (hypertension).   °· Obesity.   °· Other arrhythmias.   °· Increased age. °SIGNS AND SYMPTOMS  °· A feeling that your heart is beating rapidly or irregularly.   °· A feeling of discomfort or pain in your chest.   °· Shortness of breath.   °· Sudden light-headedness or weakness.   °· Getting tired easily when exercising.   °· Urinating more often than normal (mainly when atrial fibrillation first begins).   °In paroxysmal atrial fibrillation, symptoms may start and suddenly stop. °DIAGNOSIS  °Your health care provider may be able to detect atrial fibrillation when taking your pulse. Your health care provider may have you take a test called an ambulatory electrocardiogram (ECG). An ECG records your heartbeat patterns over a 24-hour period. You may also have other tests, such as: °· Transthoracic echocardiogram (TTE). During echocardiography, sound waves are used to evaluate how blood flows through your heart. °· Transesophageal echocardiogram (TEE). °· Stress test. There is more than one type of stress test. If a stress test is needed, ask your health care provider about which type is best for you. °· Chest X-ray exam. °· Blood tests. °· Computed tomography (CT). °TREATMENT  °Treatment may include: °· Treating any underlying conditions. For example, if you have an overactive thyroid, treating the condition may correct atrial fibrillation. °· Taking medicine. Medicines may be given to control a rapid heart rate or to prevent blood clots, heart failure, or a stroke. °· Having a procedure to correct the rhythm of the heart: °¨ Electrical cardioversion. During electrical cardioversion, a controlled, low-energy shock is delivered to the heart through your skin. If you have chest pain, very low blood pressure, or sudden heart  failure, this procedure may need to be done as an emergency. °¨ Catheter ablation. During this procedure, heart tissues that send the signals that cause atrial fibrillation are destroyed. °¨ Surgical ablation. During this surgery, thin lines of heart tissue that carry the abnormal signals are destroyed. This procedure can either be an open-heart surgery or a minimally invasive surgery. With the minimally invasive surgery, small cuts are made to access the heart instead of a large opening. °¨ Pulmonary venous isolation. During this surgery, tissue around the veins that carry blood from the lungs (pulmonary veins) is destroyed. This tissue is thought to carry the abnormal signals. °HOME CARE INSTRUCTIONS  °· Take medicines only as directed by your health care provider. Some medicines can make atrial fibrillation worse or recur. °· If blood thinners were prescribed by your health care provider,   take them exactly as directed. Too much blood-thinning medicine can cause bleeding. If you take too little, you will not have the needed protection against stroke and other problems. °· Perform blood tests at home if directed by your health care provider. Perform blood tests exactly as directed. °· Quit smoking if you smoke. °· Do not drink alcohol. °· Do not drink caffeinated beverages such as coffee, soda, and some teas. You may drink decaffeinated coffee, soda, or tea.   °· Maintain a healthy weight. Do not use diet pills unless your health care provider approves. They may make heart problems worse.   °· Follow diet instructions as directed by your health care provider. °· Exercise regularly as directed by your health care provider. °· Keep all follow-up visits as directed by your health care provider. This is important. °PREVENTION  °The following substances can cause atrial fibrillation to recur:  °· Caffeinated beverages. °· Alcohol. °· Certain medicines, especially those used for breathing problems. °· Certain herbs and  herbal medicines, such as those containing ephedra or ginseng. °· Illegal drugs, such as cocaine and amphetamines. °Sometimes medicines are given to prevent atrial fibrillation from recurring. Proper treatment of any underlying condition is also important in helping prevent recurrence.  °SEEK MEDICAL CARE IF: °· You notice a change in the rate, rhythm, or strength of your heartbeat. °· You suddenly begin urinating more frequently. °· You tire more easily when exerting yourself or exercising. °SEEK IMMEDIATE MEDICAL CARE IF:  °· You have chest pain, abdominal pain, sweating, or weakness. °· You feel nauseous. °· You have shortness of breath. °· You suddenly have swollen feet and ankles. °· You feel dizzy. °· Your face or limbs feel numb or weak. °· You have a change in your vision or speech. °MAKE SURE YOU:  °· Understand these instructions. °· Will watch your condition. °· Will get help right away if you are not doing well or get worse. °Document Released: 12/11/2005 Document Revised: 04/27/2014 Document Reviewed: 01/21/2013 °[employer]® Patient Information ©2015 [employer]. This information is not intended to replace advice given to you by your health care provider. Make sure you discuss any questions you have with your health care provider. ° °

## 2014-07-18 NOTE — ED Notes (Signed)
Patient transported to X-ray 

## 2014-07-18 NOTE — ED Notes (Signed)
Dr. Wickline at bedside.  

## 2014-07-18 NOTE — ED Provider Notes (Signed)
CSN: [id number]     Arrival date & time 07/18/14  9163 History   First MD Initiated Contact with Patient 07/18/14 0915     Chief Complaint  Patient presents with  . Weakness      Patient is a 80 y.o. male presenting with weakness. The history is provided by the patient.  Weakness This is a new problem. The current episode started 6 to 12 hours ago. The problem occurs constantly. The problem has not changed since onset.Pertinent negatives include no chest pain, no abdominal pain, no headaches and no shortness of breath. Nothing aggravates the symptoms. Nothing relieves the symptoms.  pt reports he woke up last night with "chills" and generalized weakness No focal weakness No confusion No cp/sob He reports cough He had otherwise been at his baseline yesterday No HA reported No falls reported  He reports h/o "chills" in the past that were related to a stroke  Pt lives at home with son  Past Medical History  Diagnosis Date  . Hypertension   . Anxiety   . BPH (benign prostatic hyperplasia)    Past Surgical History  Procedure Laterality Date  . Cardiac catheterization      Negative / 4 years ago   No family history on file. History  Substance Use Topics  . Smoking status: Former Smoker -- 0.50 packs/day for 5 years  . Smokeless tobacco: Never Used  . Alcohol Use: Yes     Comment: occ    Review of Systems  Constitutional: Positive for chills.  Respiratory: Negative for shortness of breath.   Cardiovascular: Negative for chest pain.  Gastrointestinal: Negative for vomiting, abdominal pain and blood in stool.  Neurological: Positive for weakness. Negative for syncope and headaches.  All other systems reviewed and are negative.     Allergies  Review of patient's allergies indicates no known allergies.  Home Medications   Prior to Admission medications   Medication Sig Start Date End Date Taking? Authorizing Provider  ALPRAZolam Duanne Moron) 1 MG tablet Take 1 mg by  mouth 2 (two) times daily.   Yes Historical Provider, MD  amLODipine-benazepril (LOTREL) 5-20 MG per capsule Take 1 capsule by mouth daily.   Yes Historical Provider, MD  aspirin 325 MG tablet Take 325 mg by mouth daily. Per neurologist   Yes Historical Provider, MD  Camphor-Eucalyptus-Menthol (VICKS VAPORUB EX) Apply 1 application topically as needed (to help breathe).   Yes Historical Provider, MD  Dutasteride-Tamsulosin HCl (JALYN) 0.5-0.4 MG CAPS Take 1 capsule by mouth daily.   Yes Historical Provider, MD  fluticasone (FLONASE) 50 MCG/ACT nasal spray Place 2 sprays into both nostrils daily.   Yes Historical Provider, MD  omeprazole (PRILOSEC) 20 MG capsule Take 20 mg by mouth daily.   Yes Historical Provider, MD   BP 158/96  Pulse 94  Temp(Src) 97.9 F (36.6 C) (Oral)  Resp 16  Ht 5\' 10"  (1.778 m)  Wt 178 lb (80.74 kg)  BMI 25.54 kg/m2  SpO2 93% Physical Exam CONSTITUTIONAL: Well developed/well nourished HEAD: Normocephalic/atraumatic EYES: EOMI/PERRL ENMT: Mucous membranes moist NECK: supple no meningeal signs SPINE:entire spine nontender CV: no murmurs/rubs/gallops noted LUNGS: crackles left base ABDOMEN: soft, nontender, no rebound or guarding GU:no cva tenderness NEURO: Pt is awake/alert, moves all extremitiesx4.  He answers questions appropriately No facial droop.  No arm/leg drift.  No dysarthria noted EXTREMITIES: pulses normal, full ROM SKIN: warm, color normal PSYCH: no abnormalities of mood noted  ED Course  Procedures   10:13 AM  Pt presents with chills and generalized weakness He has no focal signs of acute CVA He reports h/o CVA.  On chart review, he was seen in 2013 for chills by ED and his PCP and was later found to have CVA on MRI as outpatient.  Att his time, low suspicion for acute CVA.  Higher suspicion for infectious etiology.  Also EKG indicates afib, this appears new onset 10:17 AM Pt requested meds for anxiety while in the ED 12:12 PM Pt felt  improved He appears to have atrial fibrillation ,new onset and unclear time of onset I offered admission/treatment but pt wants to go home He is rate controlled at this time He is awake/alert, able to make his own decisions.   I have spoken to dr [place] with cardiology who will arrange f/u next week He requests pt start ASA 325mg  daily (pt already on ASA) He had brief hypoxia that improved with deep breathing   Labs Review Labs Reviewed  BASIC METABOLIC PANEL - Abnormal; Notable for the following:    Glucose, Bld 100 (*)    GFR calc non Af Amer 69 (*)    GFR calc Af Amer 81 (*)    Anion gap 18 (*)    All other components within normal limits  CBC - Abnormal; Notable for the following:    Platelets 148 (*)    All other components within normal limits  CBG MONITORING, ED - Abnormal; Notable for the following:    Glucose-Capillary 100 (*)    All other components within normal limits  URINALYSIS, ROUTINE W REFLEX MICROSCOPIC  I-STAT TROPOININ, ED    Imaging Review Dg Chest 2 View  07/18/2014   CLINICAL DATA:  WEAKNESS  EXAM: CHEST - 2 VIEW  COMPARISON:  04/18/2012  FINDINGS: Mild cardiomegaly. Tortuous atheromatous aorta. Prominent interstitial opacities especially in the lung bases. No confluent airspace consolidation. No effusion. Thoracic levoscoliosis with spondylitic changes.  IMPRESSION: 1. Stable mild cardiomegaly and chronic changes, no acute disease   Electronically Signed   By: Arne Cleveland M.D.   On: 07/18/2014 11:33     Date: 07/18/2014 0825am  Rate: 94  Rhythm: atrial fibrillation  QRS Axis: left  Intervals: QT prolonged  ST/T Wave abnormalities: nonspecific ST changes  Conduction Disutrbances:right bundle branch block  Narrative Interpretation:   Old EKG Reviewed: changes noted - sinus rhythm noted in previous EKG   Date: 07/18/2014 1109am  Rate: 96  Rhythm: atrial fibrillation  QRS Axis: left  Intervals: QT prolonged  ST/T Wave abnormalities: nonspecific  ST changes  Conduction Disutrbances:right bundle branch block  Narrative Interpretation:   Old EKG Reviewed: unchanged from earlier today    MDM   Final diagnoses:  Persistent atrial fibrillation  Chronic fatigue    Nursing notes including past medical history and social history reviewed and considered in documentation Labs/vital reviewed and considered Previous records reviewed and considered     Sharyon Cable, MD 07/18/14 1215

## 2014-07-18 NOTE — ED Notes (Addendum)
Pt c/o "extreme body chills" around 2am this morning and lasted for a few hours then stopped. Pt sts he feels more tired than normal since yesterday. Reports last time he felt like this was about 2 years ago when he was having a stroke. Pt reports right sided weakness since that stroke. Pt denies one sided weakness, sts " I just feel week all over". Reports he has been coughing up some mucus and tried flonase at home, reports getting some relief with this. Pt denies sob/cp. C/o chronic right hip pain. Son reports last time he had those chills the doctor said it may have been d/t low blood sugar but didn't have a fever at that time either. Nad, skin warm and dry, resp e/u.

## 2014-07-18 NOTE — ED Notes (Signed)
Pt. Stated, I was woke up at 0200 cold and then shivering and got colder and I just felt weaker.  Alert and oriented x 3 no arm drift. Son stated, everything is normal speech is sometimes mumble and slurred.  The last time he had chills like this they saifd he had a "light" stroke.

## 2014-07-18 NOTE — ED Notes (Signed)
Pt oxygen noted to be 89%, 2 L placed on patient. Pt is a x 4. Anxiety improved, pt is able to rest.

## 2014-07-22 ENCOUNTER — Ambulatory Visit (INDEPENDENT_AMBULATORY_CARE_PROVIDER_SITE_OTHER): Payer: Medicare PPO | Admitting: Family Medicine

## 2014-07-22 ENCOUNTER — Encounter: Payer: Self-pay | Admitting: Family Medicine

## 2014-07-22 VITALS — BP 130/70 | HR 86 | Temp 97.6°F | Wt 179.0 lb

## 2014-07-22 DIAGNOSIS — I4891 Unspecified atrial fibrillation: Secondary | ICD-10-CM

## 2014-07-22 DIAGNOSIS — R5381 Other malaise: Secondary | ICD-10-CM

## 2014-07-22 DIAGNOSIS — R531 Weakness: Secondary | ICD-10-CM

## 2014-07-22 DIAGNOSIS — R5383 Other fatigue: Secondary | ICD-10-CM

## 2014-07-22 DIAGNOSIS — F411 Generalized anxiety disorder: Secondary | ICD-10-CM

## 2014-07-22 MED ORDER — SERTRALINE HCL 50 MG PO TABS: 50.0000 mg | ORAL_TABLET | Freq: Every day | ORAL | Status: DC

## 2014-07-22 NOTE — Progress Notes (Signed)
   Subjective:    Patient ID: Micheal Nicholson, male    DOB: 1920/04/02, 80 y.o.   MRN: [id number]  HPI ER followup. Patient presented there on 07/18/2014 with nonspecific symptoms of weakness and " chills".  Couple years ago he had many episodes of recurrent reported chills sensation in absence of fever.  Had extensive workup with no clear etiology. It was suspected he may have had a stroke though this was not clear this was the etiology. He states these symptoms were similar. Denies any focal weakness or confusion. He had labs which were mostly unremarkable. EKG in emergency department raised concern for possible atrial fibrillation which was new. He has not had any dyspnea or chest pains or any dizziness.  Patient was rate controlled and phone conversation with cardiology. Patient was instructed to start 325 mg aspirin but he remains on 81 mg. He does not have appointment with cardiologist yet. He is symptomatically stable. No chest pains. No dizziness. No dyspnea  He has a long history of chronic underlying anxiety. His symptoms are daily. He's previously been on sertraline which seemed to help. He is currently not taking anything for anxiety. He has occasional bouts of depression but anxiety greater than depression.  Past Medical History  Diagnosis Date  . Hypertension   . Anxiety   . BPH (benign prostatic hyperplasia)    Past Surgical History  Procedure Laterality Date  . Cardiac catheterization      Negative / 4 years ago    reports that he has quit smoking. He has never used smokeless tobacco. He reports that he drinks alcohol. He reports that he does not use illicit drugs. family history is not on file. No Known Allergies    Review of Systems  Constitutional: Positive for fatigue.  Eyes: Negative for visual disturbance.  Respiratory: Negative for cough, chest tightness and shortness of breath.   Cardiovascular: Negative for chest pain, palpitations and leg swelling.    Gastrointestinal: Negative for abdominal pain.  Endocrine: Negative for polydipsia and polyuria.  Neurological: Negative for dizziness, syncope, weakness, light-headedness and headaches.  Psychiatric/Behavioral: Negative for confusion and agitation. The patient is nervous/anxious.        Objective:   Physical Exam  Constitutional: He is oriented to person, place, and time. He appears well-developed and well-nourished.  Neck: Neck supple. No thyromegaly present.  Cardiovascular: Normal rate.   Pulmonary/Chest: Effort normal and breath sounds normal. No respiratory distress. He has no wheezes. He has no rales.  Musculoskeletal: He exhibits no edema.  Neurological: He is alert and oriented to person, place, and time. No cranial nerve deficit.  Psychiatric: He has a normal mood and affect. His behavior is normal.          Assessment & Plan:  Recent nonspecific symptoms of weakness. Patient had ER evaluation with concern for possible atrial fibrillation. EKG repeat here today does not reveal clear atrial fibrillation. 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 We have recommended cardiology evaluation for second opinion. We recommended TSH but patient left before this was drawn.  Chronic anxiety. Restart sertraline 50 mg once daily- we've tried to minimize benzodiazepines given his age and risk for falls

## 2014-07-22 NOTE — Progress Notes (Signed)
Pre visit review using our clinic review tool, if applicable. No additional management support is needed unless otherwise documented below in the visit note. 

## 2014-07-28 ENCOUNTER — Telehealth: Payer: Self-pay | Admitting: Cardiology

## 2014-07-30 NOTE — Telephone Encounter (Signed)
Closed encounter °

## 2014-08-12 ENCOUNTER — Ambulatory Visit (INDEPENDENT_AMBULATORY_CARE_PROVIDER_SITE_OTHER): Payer: Medicare PPO | Admitting: Family Medicine

## 2014-08-12 ENCOUNTER — Telehealth: Payer: Self-pay | Admitting: Family Medicine

## 2014-08-12 ENCOUNTER — Encounter: Payer: Self-pay | Admitting: Family Medicine

## 2014-08-12 VITALS — BP 130/80 | HR 85 | Temp 97.4°F | Wt 180.0 lb

## 2014-08-12 DIAGNOSIS — Z23 Encounter for immunization: Secondary | ICD-10-CM

## 2014-08-12 DIAGNOSIS — G4700 Insomnia, unspecified: Secondary | ICD-10-CM

## 2014-08-12 DIAGNOSIS — F5104 Psychophysiologic insomnia: Secondary | ICD-10-CM

## 2014-08-12 DIAGNOSIS — F411 Generalized anxiety disorder: Secondary | ICD-10-CM

## 2014-08-12 DIAGNOSIS — I10 Essential (primary) hypertension: Secondary | ICD-10-CM

## 2014-08-12 NOTE — Progress Notes (Signed)
Pre visit review using our clinic review tool, if applicable. No additional management support is needed unless otherwise documented below in the visit note. 

## 2014-08-12 NOTE — Telephone Encounter (Signed)
Relevant patient education mailed to patient.  

## 2014-08-12 NOTE — Patient Instructions (Signed)
Insomnia Insomnia is frequent trouble falling and/or staying asleep. Insomnia can be a long term problem or a short term problem. Both are common. Insomnia can be a short term problem when the wakefulness is related to a certain stress or worry. Long term insomnia is often related to ongoing stress during waking hours and/or poor sleeping habits. Overtime, sleep deprivation itself can make the problem worse. Every little thing feels more severe because you are overtired and your ability to cope is decreased. CAUSES   Stress, anxiety, and depression.  Poor sleeping habits.  Distractions such as TV in the bedroom.  Naps close to bedtime.  Engaging in emotionally charged conversations before bed.  Technical reading before sleep.  Alcohol and other sedatives. They may make the problem worse. They can hurt normal sleep patterns and normal dream activity.  Stimulants such as caffeine for several hours prior to bedtime.  Pain syndromes and shortness of breath can cause insomnia.  Exercise late at night.  Changing time zones may cause sleeping problems (jet lag). It is sometimes helpful to have someone observe your sleeping patterns. They should look for periods of not breathing during the night (sleep apnea). They should also look to see how long those periods last. If you live alone or observers are uncertain, you can also be observed at a sleep clinic where your sleep patterns will be professionally monitored. Sleep apnea requires a checkup and treatment. Give your caregivers your medical history. Give your caregivers observations your family has made about your sleep.  SYMPTOMS   Not feeling rested in the morning.  Anxiety and restlessness at bedtime.  Difficulty falling and staying asleep. TREATMENT   Your caregiver may prescribe treatment for an underlying medical disorders. Your caregiver can give advice or help if you are using alcohol or other drugs for self-medication. Treatment  of underlying problems will usually eliminate insomnia problems.  Medications can be prescribed for short time use. They are generally not recommended for lengthy use.  Over-the-counter sleep medicines are not recommended for lengthy use. They can be habit forming.  You can promote easier sleeping by making lifestyle changes such as:  Using relaxation techniques that help with breathing and reduce muscle tension.  Exercising earlier in the day.  Changing your diet and the time of your last meal. No night time snacks.  Establish a regular time to go to bed.  Counseling can help with stressful problems and worry.  Soothing music and white noise may be helpful if there are background noises you cannot remove.  Stop tedious detailed work at least one hour before bedtime. HOME CARE INSTRUCTIONS   Keep a diary. Inform your caregiver about your progress. This includes any medication side effects. See your caregiver regularly. Take note of:  Times when you are asleep.  Times when you are awake during the night.  The quality of your sleep.  How you feel the next day. This information will help your caregiver care for you.  Get out of bed if you are still awake after 15 minutes. Read or do some quiet activity. Keep the lights down. Wait until you feel sleepy and go back to bed.  Keep regular sleeping and waking hours. Avoid naps.  Exercise regularly.  Avoid distractions at bedtime. Distractions include watching television or engaging in any intense or detailed activity like attempting to balance the household checkbook.  Develop a bedtime ritual. Keep a familiar routine of bathing, brushing your teeth, climbing into bed at the same   time each night, listening to soothing music. Routines increase the success of falling to sleep faster.  Use relaxation techniques. This can be using breathing and muscle tension release routines. It can also include visualizing peaceful scenes. You can  also help control troubling or intruding thoughts by keeping your mind occupied with boring or repetitive thoughts like the old concept of counting sheep. You can make it more creative like imagining planting one beautiful flower after another in your backyard garden.  During your day, work to eliminate stress. When this is not possible use some of the previous suggestions to help reduce the anxiety that accompanies stressful situations. MAKE SURE YOU:   Understand these instructions.  Will watch your condition.  Will get help right away if you are not doing well or get worse. Document Released: 12/08/2000 Document Revised: 03/04/2012 Document Reviewed: 01/08/2008 [employer] Patient Information 2015 [employer]. This information is not intended to replace advice given to you by your health care provider. Make sure you discuss any questions you have with your health care provider.  

## 2014-08-12 NOTE — Progress Notes (Signed)
   Subjective:    Patient ID: Micheal Nicholson, male    DOB: 1920/04/22, 80 y.o.   MRN: [id number]  HPI Patient here for followup regarding chronic anxiety issues. Refer to prior note. We placed him on sertraline 50 mg once daily. He thinks he has seen some improvement in terms of his chronic daily anxiety symptoms. He still has insomnia. He sometimes drinks brandy when he cannot sleep and we have cautioned against alcohol use for that. Refill somewhat less depressed and starting sertraline. Denies any side effects.  Hypertension which is stable on amlodipine benazepril. He had recent visit to ED with nonspecific malaise and fatigue. There was questionable atrial fibrillation (in ED) but not clear from EKG here last visit. He has followup with cardiology in September. Denies any dizziness. No chest pains. Walks 2 times daily without difficulty.  Past Medical History  Diagnosis Date  . Hypertension   . Anxiety   . BPH (benign prostatic hyperplasia)    Past Surgical History  Procedure Laterality Date  . Cardiac catheterization      Negative / 4 years ago    reports that he has quit smoking. He has never used smokeless tobacco. He reports that he drinks alcohol. He reports that he does not use illicit drugs. family history is not on file. No Known Allergies    Review of Systems  Eyes: Negative for visual disturbance.  Respiratory: Negative for cough, chest tightness and shortness of breath.   Cardiovascular: Negative for chest pain, palpitations and leg swelling.  Neurological: Negative for dizziness, syncope, weakness, light-headedness and headaches.       Objective:   Physical Exam  Constitutional: He is oriented to person, place, and time. He appears well-developed and well-nourished.  Neck: Neck supple. No thyromegaly present.  Cardiovascular: Normal rate and regular rhythm.  Exam reveals no gallop.   Pulmonary/Chest: Effort normal and breath sounds normal. No respiratory  distress. He has no wheezes. He has no rales.  Musculoskeletal: He exhibits no edema.  Neurological: He is alert and oriented to person, place, and time.          Assessment & Plan:  #1 chronic anxiety stable and improved with sertraline. Continue 50 mg once daily #2 recent arrhythmia. Concern for possible atrial fibrillation. Patient clinically has regular rhythm at this time. Cardiology followup pending. #3 hypertension which is stable and at goal. #4 health maintenance. Flu vaccine given #5 chronic insomnia. Sleep hygiene discussed. Avoid daily use of sedative hypnotics, if possible

## 2014-08-27 ENCOUNTER — Encounter: Payer: Self-pay | Admitting: Cardiovascular Disease

## 2014-08-27 ENCOUNTER — Ambulatory Visit (INDEPENDENT_AMBULATORY_CARE_PROVIDER_SITE_OTHER): Payer: Commercial Managed Care - HMO | Admitting: Cardiovascular Disease

## 2014-08-27 VITALS — BP 114/64 | HR 90 | Ht 70.0 in | Wt 174.3 lb

## 2014-08-27 DIAGNOSIS — Z8673 Personal history of transient ischemic attack (TIA), and cerebral infarction without residual deficits: Secondary | ICD-10-CM | POA: Insufficient documentation

## 2014-08-27 DIAGNOSIS — I480 Paroxysmal atrial fibrillation: Secondary | ICD-10-CM | POA: Insufficient documentation

## 2014-08-27 DIAGNOSIS — I4891 Unspecified atrial fibrillation: Secondary | ICD-10-CM

## 2014-08-27 DIAGNOSIS — I453 Trifascicular block: Secondary | ICD-10-CM

## 2014-08-27 DIAGNOSIS — I10 Essential (primary) hypertension: Secondary | ICD-10-CM

## 2014-08-27 NOTE — Progress Notes (Signed)
Patient ID: Micheal Nicholson, male   DOB: [birth year], 80 y.o.   MRN: [id number]     Reason for office visit atrial fibrillation  Micheal Nicholson is a remarkably healthy and fit 80 year old patient of Dr. Carolann Littler, recently incidentally diagnosed with atrial fibrillation during an emergency room visit for a febrile illness. He has a long-standing history of treated systemic hypertension and has prostate enlargement but has otherwise been quite healthy.   2006 he underwent a stress myocardial perfusion study that suggested an inferior wall scar and he underwent coronary angiography. He had a 30% narrowing in the proximal to mid LAD artery following the takeoff of the first septal and first diagonal branches but no other coronary lesions. His ejection fraction was around 45%. LVEDP was 16 mm Hg and there was no serious valvular abnormality. In 2013 his echocardiogram again showed an ejection fraction around 45% with inferior wall hypokinesis. No valvular abnormalities are described. He had mild biatrial dilatation and mild right ventricular dilatation  He has had repeated emergency room evaluation for what he calls "chills" and generalized weakness. In 2013 had some gait difficulty and dysarthria and he also reports that since then he has had problems with short-term memory. He underwent a MRI of the brain that reportedly showed a stroke (I cannot locate that report) aspirin therapy was recommended after seeing neurology.  He has easy bleeding if he scratches his skin on his forearms or hands but otherwise has never had a serious bleeding problem and has no history of intracerebral hemorrhage or gastrointestinal bleeding. He denies shortness of breath or chest discomfort; he has never experienced syncope.  He denies ever having palpitations, unless this is what he describes as "chills".  Several electrocardiograms are reviewed:  - Today his EKG shows sinus rhythm with extremely long first degree  AV block around 320 ms, right bundle branch block and left anterior fascicular block (trifascicular block) the QRS duration is 156 ms, QTC 501 ms  - July 29 ECG is interpreted as showing "possible atrial fibrillation". I think there are distinct atrial waves and grouped beating in a pattern suggestive of sinus rhythm with second-degree Mobitz type I atrioventricular block (Wenckebach cycles) and the same right bundle branch block and left anterior fascicular block.  2 electrocardiograms are available from his ER visit on July 25.  - The one performed at 8:25 AM also bears a diagnosis of atrial fibrillation with a mature apparently conducted complexes. I think the rhythm is clearly normal sinus with a very long first degree AV block and a couple of premature atrial complexes followed by blocked P waves  - A second electrocardiogram was performed at 11:09 AM and may indeed represent atrial fibrillation in its first half, although the last half probably is atrial tachycardia with one-to-one AV conduction  -An older echocardiogram from March 2013 shows sinus rhythm with long first degree AV block and a single premature atrial beats. The same right bundle branch block/left anterior fascicular block pattern is present No Known Allergies  Current Outpatient Prescriptions  Medication Sig Dispense Refill  . ALPRAZolam (XANAX) 1 MG tablet Take 1 mg by mouth 2 (two) times daily.      Marland Kitchen amLODipine-benazepril (LOTREL) 5-20 MG per capsule Take 1 capsule by mouth daily.      Marland Kitchen aspirin 325 MG tablet Take 325 mg by mouth daily. Per neurologist      . Camphor-Eucalyptus-Menthol (VICKS VAPORUB EX) Apply 1 application topically as needed (to help breathe).      Marland Kitchen  Dutasteride-Tamsulosin HCl (JALYN) 0.5-0.4 MG CAPS Take 1 capsule by mouth daily.      . fluticasone (FLONASE) 50 MCG/ACT nasal spray Place 2 sprays into both nostrils daily.      Marland Kitchen omeprazole (PRILOSEC) 20 MG capsule Take 20 mg by mouth daily.      .  sertraline (ZOLOFT) 50 MG tablet Take 1 tablet (50 mg total) by mouth daily.  30 tablet  3   No current facility-administered medications for this visit.    Past Medical History  Diagnosis Date  . Hypertension   . Anxiety   . BPH (benign prostatic hyperplasia)     Past Surgical History  Procedure Laterality Date  . Cardiac catheterization      Negative / 4 years ago    No family history on file.  History   Social History  . Marital Status: Widowed    Spouse Name: N/A    Number of Children: N/A  . Years of Education: N/A   Occupational History  . Not on file.   Social History Main Topics  . Smoking status: Former Smoker -- 0.50 packs/day for 5 years  . Smokeless tobacco: Never Used  . Alcohol Use: Yes     Comment: occ  . Drug Use: No  . Sexual Activity: Not on file   Other Topics Concern  . Not on file   Social History Narrative  . No narrative on file    Review of systems: He complains about not being as strong as he was when he was younger but he walks half a mile a day every day. He denies exertional dyspnea or chest pain. He has these poorly defined "chills" that could represent palpitations. He has never experienced syncope and he denies lower extremity edema or any new focal neurological complaints in the last couple of years.  He has not had unexplained weight gain, cough, hemoptysis or wheezing. The patient also denies abdominal pain, nausea, vomiting, dysphagia, diarrhea, constipation, polyuria, polydipsia, dysuria, hematuria, frequency, urgency, abnormal bleeding or bruising, fever, chills, unexpected weight changes, mood swings, change in skin or hair texture, change in voice quality, auditory or visual problems, allergic reactions or rashes, new musculoskeletal complaints other than usual "aches and pains".   PHYSICAL EXAM BP 114/64  Pulse 90  Ht 5\' 10"  (1.778 m)  Wt 174 lb 4.8 oz (79.062 kg)  BMI 25.01 kg/m2  General: Alert, oriented x3, no  distress, mild thoracic scoliosis Head: no evidence of trauma, PERRL, EOMI, no exophtalmos or lid lag, no myxedema, no xanthelasma; normal ears, nose and oropharynx Neck: normal jugular venous pulsations and no hepatojugular reflux; brisk carotid pulses without delay and no carotid bruits Chest: clear to auscultation, no signs of consolidation by percussion or palpation, normal fremitus, symmetrical and full respiratory excursions Cardiovascular: normal position and quality of the apical impulse, regular rhythm, normal first and widely split second heart sounds, no murmurs, rubs or gallops Abdomen: no tenderness or distention, no masses by palpation, no abnormal pulsatility or arterial bruits, normal bowel sounds, no hepatosplenomegaly Extremities: no clubbing, cyanosis or edema; 2+ radial, ulnar and brachial pulses bilaterally; 2+ right femoral, posterior tibial and dorsalis pedis pulses; 2+ left femoral, posterior tibial and dorsalis pedis pulses; no subclavian or femoral bruits Neurological: grossly nonfocal, slightly hard of hearing  Chest x-ray describes aortic calcification and atheromatosis  EKG: Described in detail above  Lipid Panel     Component Value Date/Time   CHOL 165 09/25/2012 1207   TRIG 105.0  09/25/2012 1207   HDL 50.10 09/25/2012 1207   CHOLHDL 3 09/25/2012 1207   VLDL 21.0 09/25/2012 1207   LDLCALC 94 09/25/2012 1207    BMET    Component Value Date/Time   NA 142 07/18/2014 0934   K 3.9 07/18/2014 0934   CL 102 07/18/2014 0934   CO2 22 07/18/2014 0934   GLUCOSE 100* 07/18/2014 0934   BUN 12 07/18/2014 0934   CREATININE 0.97 07/18/2014 0934   CALCIUM 9.0 07/18/2014 0934   GFRNONAA 69* 07/18/2014 0934   GFRAA 81* 07/18/2014 0934     ASSESSMENT AND PLAN   Mr Matthews has LV dysfunction, treated hypertension and advanced age as well as a previous history of stroke. He does have atrial fibrillation, this would place him at a very high risk for recurrent embolic stroke. CHADSVasc  score is at least 6.  On the other hand had his advanced age and with his complaints of recent gait unsteadiness, he is also at increased risk of bleeding.   I think most of the electrocardiograms that were interpreted as representing atrial fibrillation are actually examples of sinus rhythm with P waves are very difficult to detect because of his very long first degree AV block. However, at least one of the electrocardiograms probably does show atrial fibrillation.   Overall, my inclination is to recommend full anticoagulation therapy with warfarin or with an equivalent novel agent. Because of the scanty evidence for true atrial fibrillation and because of his increased risk for bleeding, I have not yet made this recommendation. I would like to review his echocardiogram again. If there is any evidence of serious valvular abnormalities, marked left atrial dilatation or worsening left ventricular dysfunction this would boost the case for anticoagulants.  Despite mild LV dysfunction he does not have any symptoms or signs of congestive heart failure. He is already on an ACE inhibitor. Beta blockers are contraindicated since he has evidence of advanced A-V node and intraventricular conduction abnormalities (very long first degree AV block, right bundle branch block, left anterior fascicular block and intermittent second-degree AV block Mobitz type I). He should not receive negative chronotropic agents.  His blood pressure is very well treated and his cholesterol profile is favorable. Unfortunately the report of his MR I of the brain cannot be retrieved.  Orders Placed This Encounter  Procedures  . EKG 12-Lead  . 2D Echocardiogram without contrast   No orders of the defined types were placed in this encounter.    Holli Humbles, MD, [place] [phone] office [phone] pager

## 2014-08-27 NOTE — Patient Instructions (Signed)
Your physician has requested that you have an echocardiogram. Echocardiography is a painless test that uses sound waves to create images of your heart. It provides your doctor with information about the size and shape of your heart and how well your heart's chambers and valves are working. This procedure takes approximately one hour. There are no restrictions for this procedure.  Dr. [name] recommends that you schedule a follow-up appointment in: 3 months.

## 2014-09-07 ENCOUNTER — Ambulatory Visit (HOSPITAL_COMMUNITY)
Admission: RE | Admit: 2014-09-07 | Discharge: 2014-09-07 | Disposition: A | Discharge status: Home / Self Care | Payer: Medicare PPO | Source: Ambulatory Visit | Attending: Cardiology | Admitting: Cardiology

## 2014-09-07 ENCOUNTER — Other Ambulatory Visit: Payer: Self-pay | Admitting: Cardiovascular Disease

## 2014-09-07 DIAGNOSIS — I4891 Unspecified atrial fibrillation: Secondary | ICD-10-CM | POA: Insufficient documentation

## 2014-09-07 DIAGNOSIS — I359 Nonrheumatic aortic valve disorder, unspecified: Secondary | ICD-10-CM

## 2014-09-07 DIAGNOSIS — I10 Essential (primary) hypertension: Secondary | ICD-10-CM | POA: Insufficient documentation

## 2014-09-07 DIAGNOSIS — Z87891 Personal history of nicotine dependence: Secondary | ICD-10-CM | POA: Diagnosis not present

## 2014-09-07 MED ORDER — APIXABAN 2.5 MG PO TABS: 2.5000 mg | ORAL_TABLET | Freq: Two times a day (BID) | ORAL | Status: DC

## 2014-09-07 NOTE — Progress Notes (Signed)
2D Echocardiogram Complete.  09/07/2014   Micheal Nicholson, [facility]

## 2014-09-08 ENCOUNTER — Telehealth: Payer: Self-pay | Admitting: *Deleted

## 2014-09-08 NOTE — Telephone Encounter (Signed)
Message copied by Tressa Busman on Tue Sep 08, 2014  7:56 AM ------      Message from: Sanda Klein      Created: Mon Sep 07, 2014  5:27 PM       Echocardiogram shows substantial worsening of left ventricular contraction, even when compared with the study from 2013, when it was already depressed.      We need to discuss workup for the weak heart muscle and out like to see him for followup appointment.      Meanwhile I think we should start anticoagulants. Recommend Eliquis 2.5 mg BID (I sent Rx off) ------

## 2014-09-08 NOTE — Telephone Encounter (Signed)
Echo results called to son, Gene (sounded like he said [facility]).  They will pick up the Rx for Eliquis.  Instructed to stop ASA.  Voiced understanding.  Scheduling will be calling to make an appt in the near future.

## 2014-09-09 ENCOUNTER — Telehealth: Payer: Self-pay | Admitting: Cardiovascular Disease

## 2014-09-11 NOTE — Telephone Encounter (Signed)
Closed encounter °

## 2014-09-15 ENCOUNTER — Ambulatory Visit (INDEPENDENT_AMBULATORY_CARE_PROVIDER_SITE_OTHER): Payer: Commercial Managed Care - HMO | Admitting: Cardiovascular Disease

## 2014-09-15 ENCOUNTER — Encounter: Payer: Self-pay | Admitting: Cardiovascular Disease

## 2014-09-15 VITALS — BP 126/88 | HR 68 | Ht 70.0 in | Wt 174.7 lb

## 2014-09-15 DIAGNOSIS — I5022 Chronic systolic (congestive) heart failure: Secondary | ICD-10-CM

## 2014-09-15 DIAGNOSIS — I480 Paroxysmal atrial fibrillation: Secondary | ICD-10-CM

## 2014-09-15 DIAGNOSIS — R0602 Shortness of breath: Secondary | ICD-10-CM

## 2014-09-15 DIAGNOSIS — I4891 Unspecified atrial fibrillation: Secondary | ICD-10-CM

## 2014-09-15 DIAGNOSIS — I453 Trifascicular block: Secondary | ICD-10-CM

## 2014-09-15 DIAGNOSIS — I428 Other cardiomyopathies: Secondary | ICD-10-CM

## 2014-09-15 DIAGNOSIS — I429 Cardiomyopathy, unspecified: Secondary | ICD-10-CM | POA: Insufficient documentation

## 2014-09-15 MED ORDER — LISINOPRIL 40 MG PO TABS: 40.0000 mg | ORAL_TABLET | Freq: Every day | ORAL | Status: DC

## 2014-09-15 MED ORDER — CARVEDILOL 3.125 MG PO TABS: 3.1250 mg | ORAL_TABLET | Freq: Two times a day (BID) | ORAL | Status: DC

## 2014-09-15 NOTE — Patient Instructions (Signed)
STOP the Lotrel.  START Lisinopril 40mg  daily.  START Carvedilol 3.125mg  twice a day.  Your physician has requested that you have a lexiscan myoview. For further information please visit [email or web address]. Please follow instruction sheet, as given.  Dr. [name] recommends that you schedule a follow-up appointment in: 3 weeks.

## 2014-09-15 NOTE — Assessment & Plan Note (Addendum)
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 Note history of mild CAD by cath 2006 with "false positive" (?) inferior wall scar and EF 45%. Will repeat a nuclear study. If large reversible defects are seen, would typically recommend coronary angiography. Discussed the implications of his advanced age on the risks of invasive procedures and his prognosis. The patient and his son seem unperturbed and at least for now prefer a complete workup and aggressive therapy. Stop amlodipine. Maximize ACEi dose and start low dose carvedilol. No indication for diuretics.

## 2014-09-15 NOTE — Progress Notes (Signed)
Patient ID: Micheal Nicholson, male   DOB: 02/27/1920, 80 y.o.   MRN: [id number]     Reason for office visit Follow up echo, cardiomyopathy  I asked him to come in to discuss the results of his echo, with evidence of marked reduction in LV systolic function (EF is now 30-35%, without mention of regional wall motion abnormalities). The left atrium was mildly dilated. No major valvular abnormalities seen.  Micheal Nicholson is a remarkably healthy and fit 80 year old patient of Dr. Carolann Littler, recently incidentally diagnosed with atrial fibrillation during an emergency room visit for a febrile illness. He has a long-standing history of treated systemic hypertension and has prostate enlargement but has otherwise been quite healthy.   In 2006, he underwent a stress myocardial perfusion study that suggested an inferior wall scar and he underwent coronary angiography. He had a 30% narrowing in the proximal to mid LAD artery following the takeoff of the first septal and first diagonal branches but no other coronary lesions. His ejection fraction was around 45%. LVEDP was 16 mm Hg and there was no serious valvular abnormality. In 2013 his echocardiogram again showed an ejection fraction around 45% with inferior wall hypokinesis. No valvular abnormalities are described. He had mild biatrial dilatation and mild right ventricular dilatation   No Known Allergies Current Outpatient Prescriptions on File Prior to Visit  Medication Sig Dispense Refill  . ALPRAZolam (XANAX) 1 MG tablet Take 1 mg by mouth 2 (two) times daily.      Marland Kitchen amLODipine-benazepril (LOTREL) 5-20 MG per capsule Take 1 capsule by mouth daily.      Marland Kitchen apixaban (ELIQUIS) 2.5 MG TABS tablet Take 1 tablet (2.5 mg total) by mouth 2 (two) times daily.  60 tablet  3  . Camphor-Eucalyptus-Menthol (VICKS VAPORUB EX) Apply 1 application topically as needed (to help breathe).      . Dutasteride-Tamsulosin HCl (JALYN) 0.5-0.4 MG CAPS Take 1 capsule by  mouth daily.      . fluticasone (FLONASE) 50 MCG/ACT nasal spray Place 2 sprays into both nostrils daily.      Marland Kitchen omeprazole (PRILOSEC) 20 MG capsule Take 20 mg by mouth daily.      . sertraline (ZOLOFT) 50 MG tablet Take 1 tablet (50 mg total) by mouth daily.  30 tablet  3   No current facility-administered medications on file prior to visit.     Past Medical History  Diagnosis Date  . Hypertension   . Anxiety   . BPH (benign prostatic hyperplasia)     Past Surgical History  Procedure Laterality Date  . Cardiac catheterization      Negative / 4 years ago    No family history on file.  History   Social History  . Marital Status: Widowed    Spouse Name: N/A    Number of Children: N/A  . Years of Education: N/A   Occupational History  . Not on file.   Social History Main Topics  . Smoking status: Former Smoker -- 0.50 packs/day for 5 years  . Smokeless tobacco: Never Used  . Alcohol Use: Yes     Comment: occ  . Drug Use: No  . Sexual Activity: Not on file   Other Topics Concern  . Not on file   Social History Narrative  . No narrative on file    Review of systems: He complains about not being as strong as he was when he was younger but he walks half a mile a day every  day. He denies exertional dyspnea or chest pain. He has these poorly defined "chills" that could represent palpitations. He has never experienced syncope and he denies lower extremity edema or any new focal neurological complaints in the last couple of years.  He has not had unexplained weight gain, cough, hemoptysis or wheezing. The patient also denies abdominal pain, nausea, vomiting, dysphagia, diarrhea, constipation, polyuria, polydipsia, dysuria, hematuria, frequency, urgency, abnormal bleeding or bruising, fever, chills, unexpected weight changes, mood swings, change in skin or hair texture, change in voice quality, auditory or visual problems, allergic reactions or rashes, new musculoskeletal  complaints other than usual "aches and pains".   PHYSICAL EXAM BP 126/88  Pulse 68  Ht 5\' 10"  (1.778 m)  Wt 79.243 kg (174 lb 11.2 oz)  BMI 25.07 kg/m2 General: Alert, oriented x3, no distress, mild thoracic scoliosis  Head: no evidence of trauma, PERRL, EOMI, no exophtalmos or lid lag, no myxedema, no xanthelasma; normal ears, nose and oropharynx  Neck: normal jugular venous pulsations and no hepatojugular reflux; brisk carotid pulses without delay and no carotid bruits  Chest: clear to auscultation, no signs of consolidation by percussion or palpation, normal fremitus, symmetrical and full respiratory excursions  Cardiovascular: normal position and quality of the apical impulse, regular rhythm, normal first and widely split second heart sounds, no murmurs, rubs or gallops  Abdomen: no tenderness or distention, no masses by palpation, no abnormal pulsatility or arterial bruits, normal bowel sounds, no hepatosplenomegaly  Extremities: no clubbing, cyanosis or edema; 2+ radial, ulnar and brachial pulses bilaterally; 2+ right femoral, posterior tibial and dorsalis pedis pulses; 2+ left femoral, posterior tibial and dorsalis pedis pulses; no subclavian or femoral bruits  Neurological: grossly nonfocal, slightly hard of hearing  Chest x-ray describes aortic calcification and atheromatosis   EKG: sinus rhythm with extremely long first degree AV block around 320 ms, right bundle branch block and left anterior fascicular block (trifascicular block) the QRS duration is 156 ms, QTC 501 ms   Lipid Panel     Component Value Date/Time   CHOL 165 09/25/2012 1207   TRIG 105.0 09/25/2012 1207   HDL 50.10 09/25/2012 1207   CHOLHDL 3 09/25/2012 1207   VLDL 21.0 09/25/2012 1207   LDLCALC 94 09/25/2012 1207    BMET    Component Value Date/Time   NA 142 07/18/2014 0934   K 3.9 07/18/2014 0934   CL 102 07/18/2014 0934   CO2 22 07/18/2014 0934   GLUCOSE 100* 07/18/2014 0934   BUN 12 07/18/2014 0934    CREATININE 0.97 07/18/2014 0934   CALCIUM 9.0 07/18/2014 0934   GFRNONAA 69* 07/18/2014 0934   GFRAA 81* 07/18/2014 0934     ASSESSMENT AND PLAN Cardiomyopathy -etiology uncertain Statistically, 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 Note history of mild CAD by cath 2006 with "false positive" (?) inferior wall scar and EF 45%. Will repeat a nuclear study. If large reversible defects are seen, would typically recommend coronary angiography. Discussed the implications of his advanced age on the risks of invasive procedures and his prognosis. The patient and his son seem unperturbed and at least for now prefer a complete workup and aggressive therapy. Stop amlodipine. Maximize ACEi dose and start low dose carvedilol. No indication for diuretics.   Paroxysmal atrial fibrillation The markedly reduced LV function increases the risk of cardio-embolic events and tips the risk/benefit ratio in the favor of anticoagulants, even at his age. Will start a NOAC, which will permit easier preparation for cardiac  cath if this is needed.  Trifascicular block Caution for evidence of AV block with carvedilol.   Orders Placed This Encounter  Procedures  . Myocardial Perfusion Imaging   Meds ordered this encounter  Medications  . lisinopril (PRINIVIL,ZESTRIL) 40 MG tablet    Sig: Take 1 tablet (40 mg total) by mouth daily.    Dispense:  30 tablet    Refill:  6  . carvedilol (COREG) 3.125 MG tablet    Sig: Take 1 tablet (3.125 mg total) by mouth 2 (two) times daily with a meal.    Dispense:  60 tablet    Refill:  Loomis Jonte Shiller, MD, [place] [phone] office [phone] pager

## 2014-09-15 NOTE — Assessment & Plan Note (Signed)
The markedly reduced LV function increases the risk of cardio-embolic events and tips the risk/benefit ratio in the favor of anticoagulants, even at his age. Will start a NOAC, which will permit easier preparation for cardiac cath if this is needed.

## 2014-09-15 NOTE — Assessment & Plan Note (Signed)
Caution for evidence of AV block with carvedilol.

## 2014-09-23 ENCOUNTER — Ambulatory Visit (HOSPITAL_COMMUNITY)
Admission: RE | Admit: 2014-09-23 | Discharge: 2014-09-23 | Disposition: A | Discharge status: Home / Self Care | Payer: Medicare PPO | Source: Ambulatory Visit | Attending: Cardiovascular Disease | Admitting: Cardiovascular Disease

## 2014-09-23 DIAGNOSIS — J45909 Unspecified asthma, uncomplicated: Secondary | ICD-10-CM | POA: Insufficient documentation

## 2014-09-23 DIAGNOSIS — R0602 Shortness of breath: Secondary | ICD-10-CM

## 2014-09-23 DIAGNOSIS — I5022 Chronic systolic (congestive) heart failure: Secondary | ICD-10-CM | POA: Diagnosis not present

## 2014-09-23 DIAGNOSIS — I10 Essential (primary) hypertension: Secondary | ICD-10-CM | POA: Insufficient documentation

## 2014-09-23 DIAGNOSIS — I2510 Atherosclerotic heart disease of native coronary artery without angina pectoris: Secondary | ICD-10-CM | POA: Insufficient documentation

## 2014-09-23 DIAGNOSIS — I4510 Unspecified right bundle-branch block: Secondary | ICD-10-CM | POA: Insufficient documentation

## 2014-09-23 DIAGNOSIS — Z8673 Personal history of transient ischemic attack (TIA), and cerebral infarction without residual deficits: Secondary | ICD-10-CM | POA: Diagnosis not present

## 2014-09-23 DIAGNOSIS — I509 Heart failure, unspecified: Secondary | ICD-10-CM | POA: Insufficient documentation

## 2014-09-23 DIAGNOSIS — I4891 Unspecified atrial fibrillation: Secondary | ICD-10-CM | POA: Insufficient documentation

## 2014-09-23 MED ORDER — REGADENOSON 0.4 MG/5ML IV SOLN
0.4000 mg | Freq: Once | INTRAVENOUS | Status: AC
  Administered 2014-09-23: 0.4 mg via INTRAVENOUS

## 2014-09-23 MED ORDER — TECHNETIUM TC 99M SESTAMIBI GENERIC - CARDIOLITE
10.9000 | Freq: Once | INTRAVENOUS | Status: AC | PRN
  Administered 2014-09-23: 10.9 via INTRAVENOUS

## 2014-09-23 MED ORDER — TECHNETIUM TC 99M SESTAMIBI GENERIC - CARDIOLITE
30.6000 | Freq: Once | INTRAVENOUS | Status: AC | PRN
  Administered 2014-09-23: 31 via INTRAVENOUS

## 2014-09-23 NOTE — Procedures (Addendum)
[facility] [address] [place] [address] [phone]  Cardiology Nuclear Med Study  Micheal Nicholson is a 80 y.o. male     MRN : [id number]     DOB: 1920/10/12  Procedure Date: 09/23/2014  Nuclear Med Background Indication for Stress Test:  Evaluation for Ischemia and Abnormal EKG History:  Asthma and CAD-mild (cath in 2006);Cardiomyopathy;PAF;Intrafascicular block;Chronic Systolic Heart Failure;Last NUC MPI in 2006-nonischemic with inferior wall scarring;ECHO on 09/07/2014-LVEF=30-35% Cardiac Risk Factors: CVA, Hypertension and RBBB  Symptoms:  Fatigue and SOB   Nuclear Pre-Procedure Caffeine/Decaff Intake:  1:00am NPO After: 11am   IV Site: R Hand  IV 0.9% NS with Angio Cath:  22g  Chest Size (in):  42"  IV Started by: Rolene Course, RN  Height: 5\' 10"  (1.778 m)  Cup Size: n/a  BMI:  Body mass index is 24.97 kg/(m^2). Weight:  174 lb (78.926 kg)   Tech Comments:  n/a    Nuclear Med Study 1 or 2 day study: 1 day  Stress Test Type:  [place]  Order Authorizing Provider:  Sanda Klein, MD   Resting Radionuclide: Technetium 68m Sestamibi  Resting Radionuclide Dose: 10.9 mCi   Stress Radionuclide:  Technetium 40m Sestamibi  Stress Radionuclide Dose: 30.6 mCi           Stress Protocol Rest HR: 55 Stress HR: 57  Rest BP: 151/102 Stress BP: 150/97  Exercise Time (min): n/a METS: n/a   Predicted Max HR: 128 bpm % Max HR: 71.88 bpm Rate Pressure Product: 13892  Dose of Adenosine (mg):  n/a Dose of Lexiscan: 0.4 mg  Dose of Atropine (mg): n/a Dose of Dobutamine: n/a mcg/kg/min (at max HR)  Stress Test Technologist: Leane Para, CCT Nuclear Technologist: Imagene Riches, CNMT   Rest Procedure:  Myocardial perfusion imaging was performed at rest 45 minutes following the intravenous administration of Technetium 50m Sestamibi. Stress Procedure:  The patient received IV Lexiscan 0.4 mg over 15-seconds.   Technetium 68m Sestamibi injected at 30-seconds.  There were no significant changes with Lexiscan.  Quantitative spect images were obtained after a 45 minute delay.  Transient Ischemic Dilatation (Normal <1.22):  1.08  QGS EDV:  n/a ml QGS ESV:  n/a ml LV Ejection Fraction: Study not gated    Rest ECG: Atrial Fibriliation; RBBB  Stress ECG: No significant change from baseline ECG  QPS Raw Data Images:  Normal; no motion artifact; normal heart/lung ratio. Stress Images:  Small in size and mild to moderate in intensity apical lateral inferior defect Rest Images:  Comparison with the stress images reveals no significant change.There is mild additional diaphragmatic attenuation. Subtraction (SDS):  No evidence of ischemia.  Impression Exercise Capacity:  Lexiscan with no exercise. BP Response:  Normal blood pressure response. Clinical Symptoms:  No significant symptoms noted. ECG Impression:  No significant ST segment change suggestive of ischemia. Comparison with Prior Nuclear Study: No significant change from previous study  Overall Impression:  Low risk stress nuclear study a small region of apical lateral inferior scar without ischemia.  LV Wall Motion:  Not gated   Troy Sine, MD  09/23/2014 5:32 PM

## 2014-10-05 ENCOUNTER — Other Ambulatory Visit: Payer: Self-pay | Admitting: Family Medicine

## 2014-10-05 ENCOUNTER — Ambulatory Visit (INDEPENDENT_AMBULATORY_CARE_PROVIDER_SITE_OTHER): Payer: Commercial Managed Care - HMO | Admitting: Cardiovascular Disease

## 2014-10-05 ENCOUNTER — Encounter: Payer: Self-pay | Admitting: Cardiovascular Disease

## 2014-10-05 VITALS — BP 155/108 | HR 76 | Resp 16 | Ht 70.0 in | Wt 171.6 lb

## 2014-10-05 DIAGNOSIS — I10 Essential (primary) hypertension: Secondary | ICD-10-CM

## 2014-10-05 DIAGNOSIS — I429 Cardiomyopathy, unspecified: Secondary | ICD-10-CM

## 2014-10-05 DIAGNOSIS — I480 Paroxysmal atrial fibrillation: Secondary | ICD-10-CM

## 2014-10-05 DIAGNOSIS — I453 Trifascicular block: Secondary | ICD-10-CM

## 2014-10-05 MED ORDER — CARVEDILOL 6.25 MG PO TABS: 6.2500 mg | ORAL_TABLET | Freq: Two times a day (BID) | ORAL | Status: DC

## 2014-10-05 MED ORDER — ALPRAZOLAM 0.5 MG PO TABS: 0.5000 mg | ORAL_TABLET | Freq: Two times a day (BID) | ORAL | Status: DC | PRN

## 2014-10-05 NOTE — Telephone Encounter (Signed)
Last visit 08/12/14 Last refill 04/15/14 #60 5 refill

## 2014-10-05 NOTE — Telephone Encounter (Signed)
At his age,  I advise that we try to reduce this to 0.5 mg bid  May refill for 3 months.

## 2014-10-05 NOTE — Patient Instructions (Signed)
Your physician has recommended you make the following change in your medication: increase the carvedilol to 6.25 mg twice daily. A new prescription has been sent to your pharmacy. You can take (2) of the current ones you have twice daily until gone. Then start new prescription for the increased dose amount.  Your physician recommends that you schedule a follow-up appointment in: 3 weeks with a extender and 8 weeks with Dr.  [name].

## 2014-10-05 NOTE — Progress Notes (Signed)
Patient ID: Micheal Nicholson, male   DOB: 11-Apr-1920, 80 y.o.   MRN: [id number]     Reason for office visit Cardiomyopathy  Mr. Holleran feels well and denies problems with exertional angina, exertional dyspnea, focal nonfocal deficits, lower showed edema, palpitations or syncope.  In 2006, he underwent a stress myocardial perfusion study that suggested an inferior wall scar and he underwent coronary angiography. He had a 30% narrowing in the proximal to mid LAD artery following the takeoff of the first septal and first diagonal branches but no other coronary lesions. His ejection fraction was around 45%. LVEDP was 16 mm Hg and there was no serious valvular abnormality. In 2013 his echocardiogram again showed an ejection fraction around 45% with inferior wall hypokinesis. No valvular abnormalities are described. He had mild biatrial dilatation and mild right ventricular dilatation. In 2015 he presented with asymptomatic atrial fibrillation and his echocardiogram showed a reduction in LVEF to 30-35%. A followup nuclear study showed a small apical inferolateral fixed defect, without reversible ischemia. He has a history of stroke by brain MRI (asymptomatic).   No Known Allergies  Current Outpatient Prescriptions  Medication Sig Dispense Refill  . ALPRAZolam (XANAX) 1 MG tablet Take 1 mg by mouth 2 (two) times daily.      Marland Kitchen apixaban (ELIQUIS) 2.5 MG TABS tablet Take 1 tablet (2.5 mg total) by mouth 2 (two) times daily.  60 tablet  3  . Camphor-Eucalyptus-Menthol (VICKS VAPORUB EX) Apply 1 application topically as needed (to help breathe).      . carvedilol (COREG) 3.125 MG tablet Take 1 tablet (3.125 mg total) by mouth 2 (two) times daily with a meal.  60 tablet  6  . Dutasteride-Tamsulosin HCl (JALYN) 0.5-0.4 MG CAPS Take 1 capsule by mouth daily.      . fluticasone (FLONASE) 50 MCG/ACT nasal spray Place 2 sprays into both nostrils daily.      Marland Kitchen lisinopril (PRINIVIL,ZESTRIL) 40 MG tablet Take 1  tablet (40 mg total) by mouth daily.  30 tablet  6  . omeprazole (PRILOSEC) 20 MG capsule Take 20 mg by mouth daily.      . sertraline (ZOLOFT) 50 MG tablet Take 1 tablet (50 mg total) by mouth daily.  30 tablet  3   No current facility-administered medications for this visit.    Past Medical History  Diagnosis Date  . Hypertension   . Anxiety   . BPH (benign prostatic hyperplasia)     Past Surgical History  Procedure Laterality Date  . Cardiac catheterization      Negative / 4 years ago    No family history on file.  History   Social History  . Marital Status: Widowed    Spouse Name: N/A    Number of Children: N/A  . Years of Education: N/A   Occupational History  . Not on file.   Social History Main Topics  . Smoking status: Former Smoker -- 0.50 packs/day for 5 years  . Smokeless tobacco: Never Used  . Alcohol Use: Yes     Comment: occ  . Drug Use: No  . Sexual Activity: Not on file   Other Topics Concern  . Not on file   Social History Narrative  . No narrative on file     Review of systems: He complains about not being as strong as he was when he was younger but he walks half a mile a day, every day. He denies exertional dyspnea or chest pain. He has  these poorly defined "chills" that could represent palpitations. He has never experienced syncope and he denies lower extremity edema or any new focal neurological complaints in the last couple of years.  He has not had unexplained weight gain, cough, hemoptysis or wheezing. The patient also denies abdominal pain, nausea, vomiting, dysphagia, diarrhea, constipation, polyuria, polydipsia, dysuria, hematuria, frequency, urgency, abnormal bleeding or bruising, fever, chills, unexpected weight changes, mood swings, change in skin or hair texture, change in voice quality, auditory or visual problems, allergic reactions or rashes, new musculoskeletal complaints other than usual "aches and pains".  PHYSICAL EXAM BP  155/108  Pulse 76  Ht 5\' 10"  (1.778 m)  Wt 77.837 kg (171 lb 9.6 oz)  BMI 24.62 kg/m2 General: Alert, oriented x3, no distress, mild thoracic scoliosis  Head: no evidence of trauma, PERRL, EOMI, no exophtalmos or lid lag, no myxedema, no xanthelasma; normal ears, nose and oropharynx  Neck: normal jugular venous pulsations and no hepatojugular reflux; brisk carotid pulses without delay and no carotid bruits  Chest: clear to auscultation, no signs of consolidation by percussion or palpation, normal fremitus, symmetrical and full respiratory excursions  Cardiovascular: normal position and quality of the apical impulse, regular rhythm, normal first and widely split second heart sounds, no murmurs, rubs or gallops  Abdomen: no tenderness or distention, no masses by palpation, no abnormal pulsatility or arterial bruits, normal bowel sounds, no hepatosplenomegaly  Extremities: no clubbing, cyanosis or edema; 2+ radial, ulnar and brachial pulses bilaterally; 2+ right femoral, posterior tibial and dorsalis pedis pulses; 2+ left femoral, posterior tibial and dorsalis pedis pulses; no subclavian or femoral bruits  Neurological: grossly nonfocal, slightly hard of hearing  Lipid Panel     Component Value Date/Time   CHOL 165 09/25/2012 1207   TRIG 105.0 09/25/2012 1207   HDL 50.10 09/25/2012 1207   CHOLHDL 3 09/25/2012 1207   VLDL 21.0 09/25/2012 1207   LDLCALC 94 09/25/2012 1207    BMET    Component Value Date/Time   NA 142 07/18/2014 0934   K 3.9 07/18/2014 0934   CL 102 07/18/2014 0934   CO2 22 07/18/2014 0934   GLUCOSE 100* 07/18/2014 0934   BUN 12 07/18/2014 0934   CREATININE 0.97 07/18/2014 0934   CALCIUM 9.0 07/18/2014 0934   GFRNONAA 69* 07/18/2014 0934   GFRAA 81* 07/18/2014 0934     ASSESSMENT AND PLAN Cardiomyopathy  Statistically, 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 Note history of mild CAD by cath 2006 with "false positive" (?) inferior wall scar and EF 45%. His nuclear study  did not show large reversible defects. Maximum ACEi dose. Titrate up the dose of carvedilol. No indication for diuretics.  Followup in 3 weeks with APP. If his heart rate and blood pressure allow, increase the carvedilol further to 12.5 mg twice a day. I will see him again in followup in about 6-8 weeks Paroxysmal atrial fibrillation  Tolerating Eliquis without bleeding so far Trifascicular block  Caution for evidence of AV block with carvedilol. Check ECG at each appointment. Hypertension His blood pressure is very high today, which is unusual for him. The increased carvedilol dose should help.  Holli Humbles, MD, [place] [phone] office [phone] pager

## 2014-10-05 NOTE — Telephone Encounter (Signed)
Rx changed and sent to pharmacy.  °

## 2014-10-12 ENCOUNTER — Other Ambulatory Visit: Payer: Self-pay | Admitting: Family Medicine

## 2014-10-21 ENCOUNTER — Ambulatory Visit (INDEPENDENT_AMBULATORY_CARE_PROVIDER_SITE_OTHER): Payer: Commercial Managed Care - HMO | Admitting: Family Medicine

## 2014-10-21 ENCOUNTER — Encounter: Payer: Self-pay | Admitting: Family Medicine

## 2014-10-21 VITALS — BP 140/80 | HR 76 | Temp 97.3°F | Wt 170.0 lb

## 2014-10-21 DIAGNOSIS — F5104 Psychophysiologic insomnia: Secondary | ICD-10-CM

## 2014-10-21 DIAGNOSIS — G4700 Insomnia, unspecified: Secondary | ICD-10-CM

## 2014-10-21 DIAGNOSIS — I480 Paroxysmal atrial fibrillation: Secondary | ICD-10-CM

## 2014-10-21 DIAGNOSIS — I10 Essential (primary) hypertension: Secondary | ICD-10-CM

## 2014-10-21 NOTE — Progress Notes (Signed)
   Subjective:    Patient ID: Micheal Nicholson, male    DOB: Sep 01, 1920, 80 y.o.   MRN: [id number]  HPI Patient here for routine medical follow-up. His chronic problems include history of hypertension, atrial fibrillation, BPH, GERD, past history of cerebrovascular disease, chronic insomnia, primary cardiomyopathy and trifascicular block. He has been followed by cardiology. Recent echo revealed EF of 45%. He was taken off of Lotrel and is placed on carvedilol currently 6.25 mg twice daily and also remains on lisinopril. He takes Eliquis 2.5 mg twice daily for atrial fibrillation. Has not had any recent falls and no bleeding complications. BPH symptoms have been stable.  He complains of continued problems with insomnia. We tried reducing his alprazolam 0.5 mg with concern for possible falls and age. He states that with 0.5 mg he does not sleep well. He previously took 1 mg. He is taking sertraline 50 mg once daily for chronic anxiety symptoms and those are stable. He has occasional restless leg symptoms at night but for the most part these are not impairing his sleep. No alcohol use.  Past Medical History  Diagnosis Date  . Hypertension   . Anxiety   . BPH (benign prostatic hyperplasia)    Past Surgical History  Procedure Laterality Date  . Cardiac catheterization      Negative / 4 years ago    reports that he has quit smoking. He has never used smokeless tobacco. He reports that he drinks alcohol. He reports that he does not use illicit drugs. family history is not on file. No Known Allergies    Review of Systems  Constitutional: Negative for fatigue.  Eyes: Negative for visual disturbance.  Respiratory: Negative for cough, chest tightness and shortness of breath.   Cardiovascular: Negative for chest pain, palpitations and leg swelling.  Neurological: Negative for dizziness, syncope, weakness, light-headedness and headaches.       Objective:   Physical Exam  Constitutional:  He appears well-developed and well-nourished.  Neck: Neck supple. No thyromegaly present.  Cardiovascular: Normal rate and regular rhythm.   Pulmonary/Chest: Effort normal and breath sounds normal. No respiratory distress. He has no wheezes. He has no rales.  Musculoskeletal: He exhibits no edema.  Neurological: He is alert.  Psychiatric: He has a normal mood and affect. His behavior is normal.          Assessment & Plan:  #1 history of atrial fibrillation. He appears to be in sinus rhythm today. He'll continue blood thinner and carvedilol. #2 hypertension. Recent elevations but fairly well-controlled here today with 140/80 on repeat reading left arm seated. Continue with current medications. #3 chronic insomnia. We've recommended titrating up to 1 mg alprazolam at night but have not recommended against doing this regularly. Sleep hygiene handout given.

## 2014-10-21 NOTE — Patient Instructions (Signed)
Insomnia Insomnia is frequent trouble falling and/or staying asleep. Insomnia can be a long term problem or a short term problem. Both are common. Insomnia can be a short term problem when the wakefulness is related to a certain stress or worry. Long term insomnia is often related to ongoing stress during waking hours and/or poor sleeping habits. Overtime, sleep deprivation itself can make the problem worse. Every little thing feels more severe because you are overtired and your ability to cope is decreased. CAUSES   Stress, anxiety, and depression.  Poor sleeping habits.  Distractions such as TV in the bedroom.  Naps close to bedtime.  Engaging in emotionally charged conversations before bed.  Technical reading before sleep.  Alcohol and other sedatives. They may make the problem worse. They can hurt normal sleep patterns and normal dream activity.  Stimulants such as caffeine for several hours prior to bedtime.  Pain syndromes and shortness of breath can cause insomnia.  Exercise late at night.  Changing time zones may cause sleeping problems (jet lag). It is sometimes helpful to have someone observe your sleeping patterns. They should look for periods of not breathing during the night (sleep apnea). They should also look to see how long those periods last. If you live alone or observers are uncertain, you can also be observed at a sleep clinic where your sleep patterns will be professionally monitored. Sleep apnea requires a checkup and treatment. Give your caregivers your medical history. Give your caregivers observations your family has made about your sleep.  SYMPTOMS   Not feeling rested in the morning.  Anxiety and restlessness at bedtime.  Difficulty falling and staying asleep. TREATMENT   Your caregiver may prescribe treatment for an underlying medical disorders. Your caregiver can give advice or help if you are using alcohol or other drugs for self-medication. Treatment  of underlying problems will usually eliminate insomnia problems.  Medications can be prescribed for short time use. They are generally not recommended for lengthy use.  Over-the-counter sleep medicines are not recommended for lengthy use. They can be habit forming.  You can promote easier sleeping by making lifestyle changes such as:  Using relaxation techniques that help with breathing and reduce muscle tension.  Exercising earlier in the day.  Changing your diet and the time of your last meal. No night time snacks.  Establish a regular time to go to bed.  Counseling can help with stressful problems and worry.  Soothing music and white noise may be helpful if there are background noises you cannot remove.  Stop tedious detailed work at least one hour before bedtime. HOME CARE INSTRUCTIONS   Keep a diary. Inform your caregiver about your progress. This includes any medication side effects. See your caregiver regularly. Take note of:  Times when you are asleep.  Times when you are awake during the night.  The quality of your sleep.  How you feel the next day. This information will help your caregiver care for you.  Get out of bed if you are still awake after 15 minutes. Read or do some quiet activity. Keep the lights down. Wait until you feel sleepy and go back to bed.  Keep regular sleeping and waking hours. Avoid naps.  Exercise regularly.  Avoid distractions at bedtime. Distractions include watching television or engaging in any intense or detailed activity like attempting to balance the household checkbook.  Develop a bedtime ritual. Keep a familiar routine of bathing, brushing your teeth, climbing into bed at the same   time each night, listening to soothing music. Routines increase the success of falling to sleep faster.  Use relaxation techniques. This can be using breathing and muscle tension release routines. It can also include visualizing peaceful scenes. You can  also help control troubling or intruding thoughts by keeping your mind occupied with boring or repetitive thoughts like the old concept of counting sheep. You can make it more creative like imagining planting one beautiful flower after another in your backyard garden.  During your day, work to eliminate stress. When this is not possible use some of the previous suggestions to help reduce the anxiety that accompanies stressful situations. MAKE SURE YOU:   Understand these instructions.  Will watch your condition.  Will get help right away if you are not doing well or get worse. Document Released: 12/08/2000 Document Revised: 03/04/2012 Document Reviewed: 01/08/2008 [employer] Patient Information 2015 [employer]. This information is not intended to replace advice given to you by your health care provider. Make sure you discuss any questions you have with your health care provider.  

## 2014-10-21 NOTE — Progress Notes (Signed)
Pre visit review using our clinic review tool, if applicable. No additional management support is needed unless otherwise documented below in the visit note. 

## 2014-10-26 ENCOUNTER — Other Ambulatory Visit: Payer: Self-pay | Admitting: Family Medicine

## 2014-11-26 ENCOUNTER — Ambulatory Visit (INDEPENDENT_AMBULATORY_CARE_PROVIDER_SITE_OTHER): Payer: Commercial Managed Care - HMO | Admitting: Cardiovascular Disease

## 2014-11-26 ENCOUNTER — Encounter: Payer: Self-pay | Admitting: Cardiovascular Disease

## 2014-11-26 VITALS — BP 132/60 | HR 53 | Resp 16 | Ht 70.0 in | Wt 168.3 lb

## 2014-11-26 DIAGNOSIS — I453 Trifascicular block: Secondary | ICD-10-CM

## 2014-11-26 DIAGNOSIS — I480 Paroxysmal atrial fibrillation: Secondary | ICD-10-CM

## 2014-11-26 DIAGNOSIS — I429 Cardiomyopathy, unspecified: Secondary | ICD-10-CM

## 2014-11-26 DIAGNOSIS — I10 Essential (primary) hypertension: Secondary | ICD-10-CM

## 2014-11-26 NOTE — Patient Instructions (Signed)
Dr. Croitoru recommends that you schedule a follow-up appointment in: 6 months    

## 2014-11-26 NOTE — Progress Notes (Signed)
Patient ID: Micheal Nicholson, male   DOB: 01/15/1920, 80 y.o.   MRN: [id number]     Reason for office visit Cardiomyopathy    Micheal Nicholson is here for titration of heart failure medications in the setting of cardiomyopathy of uncertain etiology.  He feels well and denies problems with exertional angina, exertional dyspnea, focal nonfocal deficits, lower showed edema, palpitations or syncope. He is on full dose ACE inhibitor and the plan was to increase his carvedilol further at today's visit. His heart rate is only 53 bpm. He does not have dizziness, syncope or fatigue. In fact he tells me he has been asking his son to take him back to play golf.  In 2006, he underwent a stress myocardial perfusion study that suggested an inferior wall scar and he underwent coronary angiography. He had a 30% narrowing in the proximal to mid LAD artery following the takeoff of the first septal and first diagonal branches but no other coronary lesions. His ejection fraction was around 45%. LVEDP was 16 mm Hg and there was no serious valvular abnormality. In 2013 his echocardiogram again showed an ejection fraction around 45% with inferior wall hypokinesis. No valvular abnormalities are described. He had mild biatrial dilatation and mild right ventricular dilatation. In 2015 he presented with asymptomatic atrial fibrillation and his echocardiogram showed a reduction in LVEF to 30-35%. A followup nuclear study showed a small apical inferolateral fixed defect, without reversible ischemia. He has a history of stroke by brain MRI (asymptomatic). No Known Allergies  Current Outpatient Prescriptions  Medication Sig Dispense Refill  . ALPRAZolam (XANAX) 0.5 MG tablet Take 1 tablet (0.5 mg total) by mouth 2 (two) times daily as needed for anxiety. 60 tablet 2  . apixaban (ELIQUIS) 2.5 MG TABS tablet Take 1 tablet (2.5 mg total) by mouth 2 (two) times daily. 60 tablet 3  . Camphor-Eucalyptus-Menthol (VICKS VAPORUB EX) Apply  1 application topically as needed (to help breathe).    . carvedilol (COREG) 6.25 MG tablet Take 1 tablet (6.25 mg total) by mouth 2 (two) times daily. 60 tablet 6  . Dutasteride-Tamsulosin HCl (JALYN) 0.5-0.4 MG CAPS Take 1 capsule by mouth daily.    . fluticasone (FLONASE) 50 MCG/ACT nasal spray Place 2 sprays into both nostrils daily.    Marland Kitchen JALYN 0.5-0.4 MG CAPS TAKE 1 CAPSULE BY MOUTH DAILY 90 capsule 3  . lisinopril (PRINIVIL,ZESTRIL) 40 MG tablet Take 1 tablet (40 mg total) by mouth daily. 30 tablet 6  . omeprazole (PRILOSEC) 20 MG capsule TAKE ONE CAPSULE BY MOUTH EVERY DAY 30 capsule 5  . sertraline (ZOLOFT) 50 MG tablet TAKE 1 TABLET (50 MG TOTAL) BY MOUTH DAILY. 30 tablet 5   No current facility-administered medications for this visit.    Past Medical History  Diagnosis Date  . Hypertension   . Anxiety   . BPH (benign prostatic hyperplasia)     Past Surgical History  Procedure Laterality Date  . Cardiac catheterization      Negative / 4 years ago    No family history on file.  History   Social History  . Marital Status: Widowed    Spouse Name: N/A    Number of Children: N/A  . Years of Education: N/A   Occupational History  . Not on file.   Social History Main Topics  . Smoking status: Former Smoker -- 0.50 packs/day for 5 years  . Smokeless tobacco: Never Used  . Alcohol Use: Yes     Comment:  occ  . Drug Use: No  . Sexual Activity: Not on file   Other Topics Concern  . Not on file   Social History Narrative    Review of systems: He continues to walk over half a mile a day, every day. He denies exertional dyspnea or chest pain. He has these poorly defined "chills" that could represent palpitations. He has never experienced syncope and he denies lower extremity edema or any new focal neurological complaints in the last couple of years.  He has not had unexplained weight gain, cough, hemoptysis or wheezing. The patient also denies abdominal pain, nausea,  vomiting, dysphagia, diarrhea, constipation, polyuria, polydipsia, dysuria, hematuria, frequency, urgency, abnormal bleeding or bruising, fever, chills, unexpected weight changes, mood swings, change in skin or hair texture, change in voice quality, auditory or visual problems, allergic reactions or rashes, new musculoskeletal complaints other than usual "aches and pains".  PHYSICAL EXAM BP 132/60 mmHg  Pulse 53  Resp 16  Ht 5\' 10"  (1.778 m)  Wt 168 lb 4.8 oz (76.34 kg)  BMI 24.15 kg/m2 General: Alert, oriented x3, no distress, mild thoracic scoliosis  Head: no evidence of trauma, PERRL, EOMI, no exophtalmos or lid lag, no myxedema, no xanthelasma; normal ears, nose and oropharynx  Neck: normal jugular venous pulsations and no hepatojugular reflux; brisk carotid pulses without delay and no carotid bruits  Chest: clear to auscultation, no signs of consolidation by percussion or palpation, normal fremitus, symmetrical and full respiratory excursions  Cardiovascular: normal position and quality of the apical impulse, regular rhythm with occasional ectopic beats, normal first and widely split second heart sounds, no murmurs, rubs or gallops  Abdomen: no tenderness or distention, no masses by palpation, no abnormal pulsatility or arterial bruits, normal bowel sounds, no hepatosplenomegaly  Extremities: no clubbing, cyanosis or edema; 2+ radial, ulnar and brachial pulses bilaterally; 2+ right femoral, posterior tibial and dorsalis pedis pulses; 2+ left femoral, posterior tibial and dorsalis pedis pulses; no subclavian or femoral bruits  Neurological: grossly nonfocal, slightly hard of hearing  Lipid Panel     Component Value Date/Time   CHOL 165 09/25/2012 1207   TRIG 105.0 09/25/2012 1207   HDL 50.10 09/25/2012 1207   CHOLHDL 3 09/25/2012 1207   VLDL 21.0 09/25/2012 1207   LDLCALC 94 09/25/2012 1207    BMET    Component Value Date/Time   NA 142 07/18/2014 0934   K 3.9 07/18/2014  0934   CL 102 07/18/2014 0934   CO2 22 07/18/2014 0934   GLUCOSE 100* 07/18/2014 0934   BUN 12 07/18/2014 0934   CREATININE 0.97 07/18/2014 0934   CALCIUM 9.0 07/18/2014 0934   GFRNONAA 69* 07/18/2014 0934   GFRAA 81* 07/18/2014 0934     ASSESSMENT AND PLAN  Cardiomyopathy  Statistically, 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 Note history of mild CAD by cath 2006 with "false positive" (?) inferior wall scar and EF 45%. His nuclear study did not show large reversible defects. Maximum ACEi dose. Unable to further titrate the dose of carvedilol. No indication for diuretics. I will see him again in followup in about 6 months. Paroxysmal atrial fibrillation  Tolerating Eliquis without bleeding so far Trifascicular block  Caution for evidence of AV block with carvedilol.  Hypertension Good control.  Holli Humbles, MD, [place] [phone] office [phone] pager

## 2014-11-30 ENCOUNTER — Ambulatory Visit: Payer: Commercial Managed Care - HMO | Admitting: Cardiovascular Disease

## 2014-12-23 ENCOUNTER — Ambulatory Visit (INDEPENDENT_AMBULATORY_CARE_PROVIDER_SITE_OTHER): Payer: Commercial Managed Care - HMO | Admitting: Family Medicine

## 2014-12-23 ENCOUNTER — Encounter: Payer: Self-pay | Admitting: Family Medicine

## 2014-12-23 VITALS — BP 120/70 | HR 64 | Temp 97.5°F | Wt 173.0 lb

## 2014-12-23 DIAGNOSIS — F411 Generalized anxiety disorder: Secondary | ICD-10-CM

## 2014-12-23 MED ORDER — SERTRALINE HCL 100 MG PO TABS: 100.0000 mg | ORAL_TABLET | Freq: Every day | ORAL | Status: DC

## 2014-12-23 NOTE — Progress Notes (Signed)
   Subjective:    Patient ID: Micheal Nicholson, male    DOB: 1920/06/09, 80 y.o.   MRN: [id number]  HPI Patient here today to discuss medications. He has long history of generalized anxiety. He has basically been on low-dose alprazolam for several years and takes one or 2 tablets at night. He is complaining of daytime anxiety. Is currently on sertraline 50 mg daily. We expressed  our reluctance to increase benzodiazepines, especially at his age and especially with chronic anticoagulation.   he's been steady of feet. No recent falls. No chest pains. No dizziness. Denies depression  Past Medical History  Diagnosis Date  . Hypertension   . Anxiety   . BPH (benign prostatic hyperplasia)    Past Surgical History  Procedure Laterality Date  . Cardiac catheterization      Negative / 4 years ago    reports that he has quit smoking. He has never used smokeless tobacco. He reports that he drinks alcohol. He reports that he does not use illicit drugs. family history is not on file. No Known Allergies    Review of Systems  Constitutional: Negative for fatigue.  Eyes: Negative for visual disturbance.  Respiratory: Negative for cough, chest tightness and shortness of breath.   Cardiovascular: Negative for chest pain, palpitations and leg swelling.  Neurological: Negative for dizziness, syncope, weakness, light-headedness and headaches.       Objective:   Physical Exam  Constitutional: He is oriented to person, place, and time. He appears well-developed and well-nourished.  Cardiovascular:  Slightly bradycardic with heart rate 56. He has mild irregularity  Pulmonary/Chest: Effort normal and breath sounds normal. No respiratory distress. He has no wheezes. He has no rales.  Neurological: He is alert and oriented to person, place, and time. No cranial nerve deficit.  Psychiatric: He has a normal mood and affect. His behavior is normal. Judgment and thought content normal.            Assessment & Plan:  Chronic anxiety. We recommended titrating sertraline to 100 mg daily. We expressed our reservations about increasing benzodiazepine, especially his age. Reassess one month

## 2014-12-23 NOTE — Progress Notes (Signed)
Pre visit review using our clinic review tool, if applicable. No additional management support is needed unless otherwise documented below in the visit note. 

## 2014-12-29 ENCOUNTER — Other Ambulatory Visit: Payer: Self-pay | Admitting: Cardiovascular Disease

## 2014-12-30 ENCOUNTER — Other Ambulatory Visit: Payer: Self-pay | Admitting: Family Medicine

## 2014-12-30 NOTE — Telephone Encounter (Signed)
Last visit 12/23/14 Last refill 10/05/14 #60 2 refill

## 2014-12-30 NOTE — Telephone Encounter (Signed)
He should NOT be exceeding one BID, as discussed at visit.may refill for 3 months

## 2015-01-05 DIAGNOSIS — M5136 Other intervertebral disc degeneration, lumbar region: Secondary | ICD-10-CM | POA: Diagnosis not present

## 2015-01-14 DIAGNOSIS — G894 Chronic pain syndrome: Secondary | ICD-10-CM | POA: Diagnosis not present

## 2015-01-14 DIAGNOSIS — M5136 Other intervertebral disc degeneration, lumbar region: Secondary | ICD-10-CM | POA: Diagnosis not present

## 2015-01-25 ENCOUNTER — Ambulatory Visit (INDEPENDENT_AMBULATORY_CARE_PROVIDER_SITE_OTHER): Payer: Commercial Managed Care - HMO | Admitting: Family Medicine

## 2015-01-25 ENCOUNTER — Encounter: Payer: Self-pay | Admitting: Family Medicine

## 2015-01-25 VITALS — BP 140/70 | HR 56 | Temp 97.8°F | Wt 172.0 lb

## 2015-01-25 DIAGNOSIS — L989 Disorder of the skin and subcutaneous tissue, unspecified: Secondary | ICD-10-CM

## 2015-01-25 DIAGNOSIS — I10 Essential (primary) hypertension: Secondary | ICD-10-CM

## 2015-01-25 NOTE — Progress Notes (Signed)
   Subjective:    Patient ID: Micheal Nicholson, male    DOB: 04/25/1920, 81 y.o.   MRN: [id number]  HPI History of chronic anxiety. Refer to prior note. We titrated his sertraline to 100 mg daily and he thinks this has helped tremendously. He feels less anxious. He is sleeping well at night. He requested increasing alprazolam and we expressed our reservation because of his age especially. We've tried taper him off alprazolam in the past without success. He has not had any recent falls. He's had some recent right lumbar radiculopathy symptoms. Had recent epidural injection which has helped about 80%  Nodular lesion dorsum left hand. Nonpainful. No bleeding. He only noticed for couple months or so. Denies any prior history of skin cancer. No recent appetite or weight changes.  Hypertension which is been stable.  Medications reviewed. No orthostatic symptoms. No peripheral edema issues.  Past Medical History  Diagnosis Date  . Hypertension   . Anxiety   . BPH (benign prostatic hyperplasia)    Past Surgical History  Procedure Laterality Date  . Cardiac catheterization      Negative / 4 years ago    reports that he has quit smoking. He has never used smokeless tobacco. He reports that he drinks alcohol. He reports that he does not use illicit drugs. family history is not on file. No Known Allergies    Review of Systems  Constitutional: Negative for fatigue.  Eyes: Negative for visual disturbance.  Respiratory: Negative for cough, chest tightness and shortness of breath.   Cardiovascular: Negative for chest pain, palpitations and leg swelling.  Neurological: Negative for dizziness, syncope, weakness, light-headedness and headaches.       Objective:   Physical Exam  Constitutional: He is oriented to person, place, and time. He appears well-developed and well-nourished.  HENT:  Right Ear: External ear normal.  Left Ear: External ear normal.  Mouth/Throat: Oropharynx is clear  and moist.  Eyes: Pupils are equal, round, and reactive to light.  Neck: Neck supple. No thyromegaly present.  Cardiovascular: Normal rate and regular rhythm.   Pulmonary/Chest: Effort normal and breath sounds normal. No respiratory distress. He has no wheezes. He has no rales.  Musculoskeletal: He exhibits no edema.  Neurological: He is alert and oriented to person, place, and time.  Skin:  Nodular lesion dorsum left hand. Approximately 1 cm diameter. No ulceration          Assessment & Plan:  #1 history of chronic anxiety. Improved on increased dose of sertraline. Continue to minimize benzodiazepine use. #2 nodular lesion left hand. Rule out squamous cell carcinoma. Set up referral to [facility]. # 3 hypertension- stable and at goal.

## 2015-01-25 NOTE — Patient Instructions (Signed)
We will call you regarding dermatology referral.

## 2015-01-25 NOTE — Progress Notes (Signed)
Pre visit review using our clinic review tool, if applicable. No additional management support is needed unless otherwise documented below in the visit note. 

## 2015-03-02 DIAGNOSIS — Z6824 Body mass index (BMI) 24.0-24.9, adult: Secondary | ICD-10-CM | POA: Diagnosis not present

## 2015-03-02 DIAGNOSIS — F329 Major depressive disorder, single episode, unspecified: Secondary | ICD-10-CM | POA: Diagnosis not present

## 2015-03-02 DIAGNOSIS — H9190 Unspecified hearing loss, unspecified ear: Secondary | ICD-10-CM | POA: Diagnosis not present

## 2015-03-02 DIAGNOSIS — K219 Gastro-esophageal reflux disease without esophagitis: Secondary | ICD-10-CM | POA: Diagnosis not present

## 2015-03-02 DIAGNOSIS — I10 Essential (primary) hypertension: Secondary | ICD-10-CM | POA: Diagnosis not present

## 2015-03-02 DIAGNOSIS — I4891 Unspecified atrial fibrillation: Secondary | ICD-10-CM | POA: Diagnosis not present

## 2015-03-02 DIAGNOSIS — Z7901 Long term (current) use of anticoagulants: Secondary | ICD-10-CM | POA: Diagnosis not present

## 2015-03-02 DIAGNOSIS — J302 Other seasonal allergic rhinitis: Secondary | ICD-10-CM | POA: Diagnosis not present

## 2015-03-02 DIAGNOSIS — N400 Enlarged prostate without lower urinary tract symptoms: Secondary | ICD-10-CM | POA: Diagnosis not present

## 2015-03-04 ENCOUNTER — Ambulatory Visit (INDEPENDENT_AMBULATORY_CARE_PROVIDER_SITE_OTHER): Payer: Commercial Managed Care - HMO | Admitting: Family Medicine

## 2015-03-04 ENCOUNTER — Encounter: Payer: Self-pay | Admitting: Family Medicine

## 2015-03-04 VITALS — BP 140/78 | HR 58 | Temp 97.4°F | Wt 176.0 lb

## 2015-03-04 DIAGNOSIS — F411 Generalized anxiety disorder: Secondary | ICD-10-CM | POA: Diagnosis not present

## 2015-03-04 DIAGNOSIS — L03211 Cellulitis of face: Secondary | ICD-10-CM

## 2015-03-04 DIAGNOSIS — R531 Weakness: Secondary | ICD-10-CM

## 2015-03-04 DIAGNOSIS — L0201 Cutaneous abscess of face: Secondary | ICD-10-CM

## 2015-03-04 LAB — CBC WITH DIFFERENTIAL/PLATELET
Basophils Absolute: 0 10*3/uL (ref 0.0–0.1)
Basophils Relative: 0.2 % (ref 0.0–3.0)
EOS ABS: 0.1 10*3/uL (ref 0.0–0.7)
Eosinophils Relative: 1.7 % (ref 0.0–5.0)
HEMATOCRIT: 40.9 % (ref 39.0–52.0)
Hemoglobin: 13.9 g/dL (ref 13.0–17.0)
Lymphocytes Relative: 22.3 % (ref 12.0–46.0)
Lymphs Abs: 1 10*3/uL (ref 0.7–4.0)
MCHC: 34 g/dL (ref 30.0–36.0)
MCV: 87.2 fl (ref 78.0–100.0)
MONOS PCT: 7.6 % (ref 3.0–12.0)
Monocytes Absolute: 0.3 10*3/uL (ref 0.1–1.0)
NEUTROS PCT: 68.2 % (ref 43.0–77.0)
Neutro Abs: 3 10*3/uL (ref 1.4–7.7)
Platelets: 195 10*3/uL (ref 150.0–400.0)
RBC: 4.69 Mil/uL (ref 4.22–5.81)
RDW: 16 % — AB (ref 11.5–15.5)
WBC: 4.3 10*3/uL (ref 4.0–10.5)

## 2015-03-04 LAB — BASIC METABOLIC PANEL
BUN: 11 mg/dL (ref 6–23)
CO2: 25 mEq/L (ref 19–32)
CREATININE: 1 mg/dL (ref 0.40–1.50)
Calcium: 8.9 mg/dL (ref 8.4–10.5)
Chloride: 104 mEq/L (ref 96–112)
GFR: 74.13 mL/min (ref >60.00)
Glucose, Bld: 97 mg/dL (ref 70–99)
Potassium: 4.3 mEq/L (ref 3.5–5.1)
Sodium: 134 mEq/L — ABNORMAL LOW (ref 135–145)

## 2015-03-04 LAB — TSH: TSH: 0.71 u[IU]/mL (ref 0.35–4.50)

## 2015-03-04 LAB — VITAMIN D 25 HYDROXY (VIT D DEFICIENCY, FRACTURES): VITD: 12.96 ng/mL — ABNORMAL LOW (ref 30.00–100.00)

## 2015-03-04 MED ORDER — DOXYCYCLINE HYCLATE 100 MG PO CAPS
100.0000 mg | ORAL_CAPSULE | Freq: Two times a day (BID) | ORAL | Status: DC
Start: 2015-03-04 — End: 2015-03-11

## 2015-03-04 NOTE — Progress Notes (Signed)
Subjective:    Patient ID: Micheal Nicholson, male    DOB: 08/12/1920, 81 y.o.   MRN: [id number]  HPI Patient is here complaining of approximate 1-2 week history of legs "giving out "when he walks. He generally walks about 1-2 miles per day but recently has had difficulty eating walking 100 yards. He denies any shortness of breath. No chest pains. He states his legs feel extremely fatigued. He is not describing any leg pain or claudication-type symptoms. No history of known peripheral vascular disease. Generally feels more fatigued.  Other new acute issue is right facial swelling and redness right cheek region. He's noticed some soreness in this area. No fevers or chills. No drainage.  Long history of anxiety. He has felt somewhat more anxious recently. He is on sertraline 100 mg daily. For several years he has been on low-dose Xanax as needed and we have strongly encouraged him to try to taper back on this. No recent falls.  His chronic problems include history of atrial fibrillation, BPH, hypertension, history of stroke, chronic insomnia, chronic anxiety.  Denies any depressive symptoms  Past Medical History  Diagnosis Date  . Hypertension   . Anxiety   . BPH (benign prostatic hyperplasia)    Past Surgical History  Procedure Laterality Date  . Cardiac catheterization      Negative / 4 years ago    reports that he has quit smoking. He has never used smokeless tobacco. He reports that he drinks alcohol. He reports that he does not use illicit drugs. family history is not on file. No Known Allergies    Review of Systems  Constitutional: Positive for fatigue. Negative for fever, chills, appetite change and unexpected weight change.  Respiratory: Negative for cough and shortness of breath.   Cardiovascular: Negative for chest pain and palpitations.  Gastrointestinal: Negative for nausea, vomiting, abdominal pain and diarrhea.  Genitourinary: Negative for dysuria.  Neurological:  Positive for weakness. Negative for dizziness and headaches.  Psychiatric/Behavioral: Negative for confusion.       Objective:   Physical Exam  Constitutional: He is oriented to person, place, and time. He appears well-developed and well-nourished.  HENT:  Mouth/Throat: Oropharynx is clear and moist.  Neck: Neck supple. No JVD present. No thyromegaly present.  Cardiovascular: Normal rate and regular rhythm.   Pulmonary/Chest: Effort normal and breath sounds normal. No respiratory distress. He has no wheezes. He has no rales.  Musculoskeletal: He exhibits edema.  Trace edema legs bilaterally Feet are warm to touch with 1+ dorsalis pedis pulses bilaterally.  Neurological: He is alert and oriented to person, place, and time.  No focal strength deficits  Skin:  Patient has erythematous slightly tender nodular lesion right cheek. Pustular center. With minimal pressure he had fairly copious drainage of purulence and what appears to be sebaceous cyst contents. Topical antibiotic and bandage applied          Assessment & Plan:  #1 recent fatigue with walking and decreased leg stamina. He is not describing any shortness of breath or chest pain. No claudication symptoms. Check labs with CBC, basic metabolic panel, vitamin D level, TSH. #2 abscess right face. Suspect abscessed sebaceous cyst. Were able to drain this as this was spontaneously starting to drain. Warm compresses several times daily. He does have some mild surrounding cellulitis changes. Doxycycline 100 mg twice a day for 7 days. Reassess one week.  NO I and D was performed. #3 chronic anxiety. Continue sertraline 100 mg daily. Avoid escalation  in Xanax especially at his age

## 2015-03-04 NOTE — Patient Instructions (Signed)
Use warm compresses to face several times daily

## 2015-03-04 NOTE — Progress Notes (Signed)
Pre visit review using our clinic review tool, if applicable. No additional management support is needed unless otherwise documented below in the visit note. 

## 2015-03-08 ENCOUNTER — Other Ambulatory Visit: Payer: Self-pay

## 2015-03-08 ENCOUNTER — Other Ambulatory Visit: Payer: Self-pay | Admitting: Family Medicine

## 2015-03-08 DIAGNOSIS — E559 Vitamin D deficiency, unspecified: Secondary | ICD-10-CM

## 2015-03-08 MED ORDER — VITAMIN D (ERGOCALCIFEROL) 1.25 MG (50000 UNIT) PO CAPS: 50000.0000 [IU] | ORAL_CAPSULE | ORAL | Status: DC

## 2015-03-11 ENCOUNTER — Encounter: Payer: Self-pay | Admitting: Family Medicine

## 2015-03-11 ENCOUNTER — Ambulatory Visit (INDEPENDENT_AMBULATORY_CARE_PROVIDER_SITE_OTHER): Payer: Commercial Managed Care - HMO | Admitting: Family Medicine

## 2015-03-11 VITALS — BP 130/72 | HR 64 | Wt 180.0 lb

## 2015-03-11 DIAGNOSIS — F411 Generalized anxiety disorder: Secondary | ICD-10-CM | POA: Diagnosis not present

## 2015-03-11 DIAGNOSIS — E559 Vitamin D deficiency, unspecified: Secondary | ICD-10-CM

## 2015-03-11 DIAGNOSIS — L0201 Cutaneous abscess of face: Secondary | ICD-10-CM | POA: Diagnosis not present

## 2015-03-11 NOTE — Progress Notes (Signed)
   Subjective:    Patient ID: Micheal Nicholson, male    DOB: 28-Nov-1920, 81 y.o.   MRN: [id number]  HPI Patient seen for the following  Recent right facial abscess. Abscessed sebaceous cyst. Is not using warm compresses consistently. Finishing out doxycycline. No fever.  Less edema c/w last week.   Long history of chronic anxiety. Takes sertraline 100 mg daily. He is on Xanax 0.5 mg twice daily. He is many times requested increase in dose and we have many times expressed our concern about escalating dose or in fact using this altogether his age because of risk of falls.  Recent weakness with ambulation. Vitamin D level very low at 12. Started vitamin D 50,000 international units once weekly  Past Medical History  Diagnosis Date  . Hypertension   . Anxiety   . BPH (benign prostatic hyperplasia)    Past Surgical History  Procedure Laterality Date  . Cardiac catheterization      Negative / 4 years ago    reports that he has quit smoking. He has never used smokeless tobacco. He reports that he drinks alcohol. He reports that he does not use illicit drugs. family history is not on file. No Known Allergies    Review of Systems  Constitutional: Negative for fever and chills.  Endocrine: Negative for polydipsia and polyuria.  Neurological: Positive for weakness. Negative for syncope and headaches.  Psychiatric/Behavioral: Positive for sleep disturbance. Negative for suicidal ideas, confusion and agitation. The patient is nervous/anxious.        Objective:   Physical Exam  Constitutional: He appears well-developed and well-nourished.  Neck: Neck supple.  Cardiovascular: Normal rate and regular rhythm.   Pulmonary/Chest: Effort normal and breath sounds normal. No respiratory distress. He has no wheezes. He has no rales.  Musculoskeletal: He exhibits no edema.  Neurological: He is alert.  Skin:  Right cheek area reveals minimal erythema. Minimal fluctuance. Small eschar  unroofed. Expressed some purulent drainage. No cellulitis changes          Assessment & Plan:  #1 right facial abscess from sebaceous cyst that was abscessed. Continue warm compresses. This is draining spontaneously. Finish out doxycycline #2 chronic anxiety. Titrate sertraline 100 mg 1-1/2 tablets daily. Avoid further titration of benzodiazepines  # 3 vitamin D deficiency. Patient just started on replacement. Son thinks he is already seeing some improvements in his ambulation and weakness. Recheck level in 3 months

## 2015-03-11 NOTE — Patient Instructions (Addendum)
Contintiue with warm compresses to face several times daily. Increase the Sertraline to one AND one half daily

## 2015-03-11 NOTE — Progress Notes (Signed)
Pre visit review using our clinic review tool, if applicable. No additional management support is needed unless otherwise documented below in the visit note. 

## 2015-03-14 ENCOUNTER — Emergency Department (HOSPITAL_COMMUNITY)
Admission: EM | Admit: 2015-03-14 | Discharge: 2015-03-14 | Disposition: A | Discharge status: Home / Self Care | Payer: Commercial Managed Care - HMO | Attending: Emergency Medicine | Admitting: Emergency Medicine

## 2015-03-14 ENCOUNTER — Encounter (HOSPITAL_COMMUNITY): Payer: Self-pay | Admitting: Emergency Medicine

## 2015-03-14 ENCOUNTER — Emergency Department (HOSPITAL_COMMUNITY): Payer: Commercial Managed Care - HMO

## 2015-03-14 DIAGNOSIS — Z79899 Other long term (current) drug therapy: Secondary | ICD-10-CM | POA: Diagnosis not present

## 2015-03-14 DIAGNOSIS — I10 Essential (primary) hypertension: Secondary | ICD-10-CM | POA: Insufficient documentation

## 2015-03-14 DIAGNOSIS — Z7951 Long term (current) use of inhaled steroids: Secondary | ICD-10-CM | POA: Insufficient documentation

## 2015-03-14 DIAGNOSIS — I440 Atrioventricular block, first degree: Secondary | ICD-10-CM | POA: Diagnosis not present

## 2015-03-14 DIAGNOSIS — R001 Bradycardia, unspecified: Secondary | ICD-10-CM | POA: Diagnosis not present

## 2015-03-14 DIAGNOSIS — I5021 Acute systolic (congestive) heart failure: Secondary | ICD-10-CM | POA: Diagnosis not present

## 2015-03-14 DIAGNOSIS — R0602 Shortness of breath: Secondary | ICD-10-CM | POA: Diagnosis not present

## 2015-03-14 DIAGNOSIS — Z9889 Other specified postprocedural states: Secondary | ICD-10-CM | POA: Diagnosis not present

## 2015-03-14 DIAGNOSIS — Z87891 Personal history of nicotine dependence: Secondary | ICD-10-CM | POA: Insufficient documentation

## 2015-03-14 DIAGNOSIS — R531 Weakness: Secondary | ICD-10-CM | POA: Diagnosis present

## 2015-03-14 LAB — COMPREHENSIVE METABOLIC PANEL
ALT: 15 U/L (ref 0–53)
AST: 24 U/L (ref 0–37)
Albumin: 4.1 g/dL (ref 3.5–5.2)
Alkaline Phosphatase: 52 U/L (ref 39–117)
Anion gap: 10 (ref 5–15)
BUN: 16 mg/dL (ref 6–23)
CHLORIDE: 102 mmol/L (ref 96–112)
CO2: 23 mmol/L (ref 19–32)
Calcium: 9 mg/dL (ref 8.4–10.5)
Creatinine, Ser: 1.07 mg/dL (ref 0.50–1.35)
GFR calc Af Amer: 67 mL/min — ABNORMAL LOW (ref >90)
GFR calc non Af Amer: 58 mL/min — ABNORMAL LOW (ref >90)
Glucose, Bld: 104 mg/dL — ABNORMAL HIGH (ref 70–99)
POTASSIUM: 4 mmol/L (ref 3.5–5.1)
Sodium: 135 mmol/L (ref 135–145)
Total Bilirubin: 2.8 mg/dL — ABNORMAL HIGH (ref 0.3–1.2)
Total Protein: 6.6 g/dL (ref 6.0–8.3)

## 2015-03-14 LAB — CBC WITH DIFFERENTIAL/PLATELET
Basophils Absolute: 0 10*3/uL (ref 0.0–0.1)
Basophils Relative: 0 % (ref 0–1)
Eosinophils Absolute: 0.1 10*3/uL (ref 0.0–0.7)
Eosinophils Relative: 1 % (ref 0–5)
HCT: 40.2 % (ref 39.0–52.0)
Hemoglobin: 13.8 g/dL (ref 13.0–17.0)
Lymphocytes Relative: 18 % (ref 12–46)
Lymphs Abs: 0.7 10*3/uL (ref 0.7–4.0)
MCH: 30.2 pg (ref 26.0–34.0)
MCHC: 34.3 g/dL (ref 30.0–36.0)
MCV: 88 fL (ref 78.0–100.0)
MONOS PCT: 9 % (ref 3–12)
Monocytes Absolute: 0.4 10*3/uL (ref 0.1–1.0)
NEUTROS ABS: 2.9 10*3/uL (ref 1.7–7.7)
Neutrophils Relative %: 72 % (ref 43–77)
PLATELETS: 147 10*3/uL — AB (ref 150–400)
RBC: 4.57 MIL/uL (ref 4.22–5.81)
RDW: 15.2 % (ref 11.5–15.5)
WBC: 4.1 10*3/uL (ref 4.0–10.5)

## 2015-03-14 LAB — URINALYSIS, ROUTINE W REFLEX MICROSCOPIC
BILIRUBIN URINE: NEGATIVE
GLUCOSE, UA: NEGATIVE mg/dL
HGB URINE DIPSTICK: NEGATIVE
Ketones, ur: NEGATIVE mg/dL
LEUKOCYTES UA: NEGATIVE
Nitrite: NEGATIVE
Protein, ur: NEGATIVE mg/dL
SPECIFIC GRAVITY, URINE: 1.014 (ref 1.005–1.030)
UROBILINOGEN UA: 1 mg/dL (ref 0.0–1.0)
pH: 5.5 (ref 5.0–8.0)

## 2015-03-14 LAB — TROPONIN I: Troponin I: 0.04 ng/mL — ABNORMAL HIGH (ref <0.031)

## 2015-03-14 LAB — BRAIN NATRIURETIC PEPTIDE: B Natriuretic Peptide: 813.5 pg/mL — ABNORMAL HIGH (ref 0.0–100.0)

## 2015-03-14 MED ORDER — FUROSEMIDE 20 MG PO TABS: 20.0000 mg | ORAL_TABLET | Freq: Every day | ORAL | Status: DC

## 2015-03-14 MED ORDER — HYDRALAZINE HCL 10 MG PO TABS
10.0000 mg | ORAL_TABLET | Freq: Once | ORAL | Status: AC
  Administered 2015-03-14: 10 mg via ORAL
  Filled 2015-03-14 (×2): qty 1

## 2015-03-14 MED ORDER — FUROSEMIDE 10 MG/ML IJ SOLN
20.0000 mg | Freq: Once | INTRAMUSCULAR | Status: AC
  Administered 2015-03-14: 20 mg via INTRAVENOUS
  Filled 2015-03-14: qty 4

## 2015-03-14 NOTE — ED Notes (Signed)
Pt from c/o weakness. He reports going to his PCP this past week and had low vitamin B and was given a pill on Wednesday. He denies dizziness, headache,blurred vision, or weakness to one side. Pt is alert and oriented but weak. Son reports that patient normally gets on the bike to cycle 4 times day 30 minute sessions and he is only able to cycle for 10 minutes.

## 2015-03-14 NOTE — ED Provider Notes (Signed)
CSN: [id number]     Arrival date & time 03/14/15  1242 History   First MD Initiated Contact with Patient 03/14/15 1301     No chief complaint on file.     HPI Patient is brought to the emergency department for increasing generalized weakness over the past several weeks.  Patient will has been seen by his primary care physician Dr. Elease Hashimoto who did lab work in the office.  The patient was told that his vitamin levels were low and he was started on oral medication.  Family reports that he cannot walk as far distances without becoming more fatigued over the past several weeks.  Patient denies orthopnea.  He denies chest pain shortness of breath.  He denies abdominal pain.  Denies nausea vomiting or diarrhea.  Family reports baseline mental status.  No recent injury or trauma.   Past Medical History  Diagnosis Date  . Hypertension   . Anxiety   . BPH (benign prostatic hyperplasia)    Past Surgical History  Procedure Laterality Date  . Cardiac catheterization      Negative / 4 years ago   No family history on file. History  Substance Use Topics  . Smoking status: Former Smoker -- 0.50 packs/day for 5 years  . Smokeless tobacco: Never Used  . Alcohol Use: Yes     Comment: occ    Review of Systems  All other systems reviewed and are negative.     Allergies  Review of patient's allergies indicates no known allergies.  Home Medications   Prior to Admission medications   Medication Sig Start Date End Date Taking? Authorizing Provider  ALPRAZolam Duanne Moron) 0.5 MG tablet TAKE 1 TABLET BY MOUTH TWICE A DAY AS NEEDED 12/30/14   [name], MD  Camphor-Eucalyptus-Menthol (VICKS VAPORUB EX) Apply 1 application topically as needed (to help breathe).    Historical Provider, MD  carvedilol (COREG) 6.25 MG tablet Take 1 tablet (6.25 mg total) by mouth 2 (two) times daily. 10/05/14   Mihai Croitoru, MD  Dutasteride-Tamsulosin HCl (JALYN) 0.5-0.4 MG CAPS Take 1 capsule by mouth daily.     Historical Provider, MD  ELIQUIS 2.5 MG TABS tablet TAKE 1 TABLET BY MOUTH TWICE A DAY 12/29/14   Mihai Croitoru, MD  fluticasone (FLONASE) 50 MCG/ACT nasal spray Place 2 sprays into both nostrils daily.    Historical Provider, MD  JALYN 0.5-0.4 MG CAPS TAKE 1 CAPSULE BY MOUTH DAILY 10/26/14   [name], MD  lisinopril (PRINIVIL,ZESTRIL) 40 MG tablet Take 1 tablet (40 mg total) by mouth daily. 09/15/14   Mihai Croitoru, MD  omeprazole (PRILOSEC) 20 MG capsule TAKE ONE CAPSULE BY MOUTH EVERY DAY 10/12/14   [name], MD  sertraline (ZOLOFT) 100 MG tablet Take 1 tablet (100 mg total) by mouth daily. Patient taking differently: Take 150 mg by mouth daily. Take one and one half tablet daily 12/23/14   [name], MD  Vitamin D, Ergocalciferol, (DRISDOL) 50000 UNITS CAPS capsule Take 1 capsule (50,000 Units total) by mouth every 7 (seven) days. 03/08/15   [name], MD   There were no vitals taken for this visit. Physical Exam  Constitutional: He is oriented to person, place, and time. He appears well-developed and well-nourished.  HENT:  Head: Normocephalic and atraumatic.  Eyes: EOM are normal.  Neck: Normal range of motion.  Cardiovascular: Normal rate, regular rhythm, normal heart sounds and intact distal pulses.   Pulmonary/Chest: Effort normal and breath sounds normal.  No respiratory distress.  Abdominal: Soft. He exhibits no distension. There is no tenderness.  Musculoskeletal: Normal range of motion.  Neurological: He is alert and oriented to person, place, and time.  Skin: Skin is warm and dry.  Psychiatric: He has a normal mood and affect. Judgment normal.  Nursing note and vitals reviewed.   ED Course  Procedures (including critical care time) Labs Review Labs Reviewed  CBC WITH DIFFERENTIAL/PLATELET - Abnormal; Notable for the following:    Platelets 147 (*)    All other components within normal limits  COMPREHENSIVE METABOLIC PANEL - Abnormal;  Notable for the following:    Glucose, Bld 104 (*)    Total Bilirubin 2.8 (*)    GFR calc non Af Amer 58 (*)    GFR calc Af Amer 67 (*)    All other components within normal limits  TROPONIN I - Abnormal; Notable for the following:    Troponin I 0.04 (*)    All other components within normal limits  URINALYSIS, ROUTINE W REFLEX MICROSCOPIC    Imaging Review Dg Chest 2 View  03/14/2015   CLINICAL DATA:  81 year old male with shortness of breath and weakness.  EXAM: CHEST  2 VIEW  COMPARISON:  07/18/2014  FINDINGS: Cardiomegaly, small bilateral pleural effusions, pulmonary vascular congestion and probable mild interstitial pulmonary edema noted.  There is no evidence of pneumothorax.  No acute bony abnormalities are present.  IMPRESSION: Cardiomegaly with small bilateral pleural effusions and probable mild interstitial pulmonary edema.   Electronically Signed   By: Margarette Canada M.D.   On: 03/14/2015 14:06  I personally reviewed the imaging tests through PACS system I reviewed available ER/hospitalization records through the EMR    EKG Interpretation   Date/Time:  Sunday March 14 2015 14:40:58 EDT Ventricular Rate:  60 PR Interval:  480 QRS Duration: 167 QT Interval:  520 QTC Calculation: 520 R Axis:   -82 Text Interpretation:  Sinus or ectopic atrial rhythm Prolonged PR interval  Right bundle branch block No significant change was found Confirmed by  Kenzleigh Sedam  MD, Peyson Delao ([id number]) on 03/14/2015 2:44:24 PM      MDM   Final diagnoses:  None    I was able to ambulate with the patient in the hallway.  He ambulated approximately 50 yards without any difficulty.  He moved with a very steady gait.  When he returned to the room his pulse ox was normal and his heart rate was normal.  He did seem slightly fatigued however given his age it did not seem inappropriately so.  Urinalysis still pending at this time.  I believe they have his urine is normal he can be discharged home safely to  follow-up with his primary care physician Dr. Elease Hashimoto.     Jola Schmidt, MD 03/14/15 [phone]

## 2015-03-14 NOTE — ED Notes (Signed)
Nurse currently drawing labs 

## 2015-03-14 NOTE — Progress Notes (Signed)
Thanks for keeping me posted. May develop some rebound if carvedilol stopped quickly. Please let me know if I can assist.

## 2015-03-14 NOTE — ED Notes (Signed)
Pt reminded of need for urine specimen 

## 2015-03-14 NOTE — ED Notes (Signed)
Pt aware of the need for a urine sample. Urinal at bedside. 

## 2015-03-14 NOTE — ED Notes (Signed)
Pt given urinal and made aware of need for urine specimen 

## 2015-03-14 NOTE — ED Notes (Signed)
MD Steinl at bedside. 

## 2015-03-14 NOTE — ED Notes (Signed)
Pt alert and oriented upon DC. He was advised to follow up with cardiology this week and to hold carvedilol and to take lasix.

## 2015-03-14 NOTE — ED Provider Notes (Signed)
Signed out by Dr Venora Maples at [phone], that ua pending.  If neg, d/c to home.  Pt w mild fatigue, mild dyspnea w exertion, no chest pain or discomfort. Pt w new bilateral lower leg edema.  Of note, HR on monitor as low as upper 40's, no pre-syncope or syncope.  cxr noted.   Cxr, exam felt c/w mild chf. ?whether in part rate/med related (i.e. Pt on low dose carvedilol).  Lasix 20 mg iv.  Cardiology paged.  Discussed w Dr Wynonia Lawman, he indicates could give couple day lasix rx, hold carvidilol, pt to home,  and route Dr [name] message re ed visit and close outpatient follow up.   Pt agreeable w plan, does not want hospital admission.  Pt currently breathing comfortably, on room air pulse ox 95-96%, ambulates about ED.    Lajean Saver, MD 03/14/15 [phone]

## 2015-03-14 NOTE — Discharge Instructions (Signed)
It was our pleasure to provide your ER care today - we hope that you feel better.  Hold/stop the carvedilol.  Take lasix as prescribed for the next few days. Limit salt intake.  Follow up with your cardiologist in the next couple days for recheck.  Discuss your medications then, and also have your blood pressure rechecked as it is high today.  If you have not heard from their office by tomorrow at 1 PM, call the office to arrange appointment time.   Return to ER right away if worse, weak/faint, chest pain, difficulty breathing, fevers, other concern.     Heart Failure Heart failure is a condition in which the heart has trouble pumping blood. This means your heart does not pump blood efficiently for your body to work well. In some cases of heart failure, fluid may back up into your lungs or you may have swelling (edema) in your lower legs. Heart failure is usually a long-term (chronic) condition. It is important for you to take good care of yourself and follow your health care provider's treatment plan. CAUSES  Some health conditions can cause heart failure. Those health conditions include:  High blood pressure (hypertension). Hypertension causes the heart muscle to work harder than normal. When pressure in the blood vessels is high, the heart needs to pump (contract) with more force in order to circulate blood throughout the body. High blood pressure eventually causes the heart to become stiff and weak.  Coronary artery disease (CAD). CAD is the buildup of cholesterol and fat (plaque) in the arteries of the heart. The blockage in the arteries deprives the heart muscle of oxygen and blood. This can cause chest pain and may lead to a heart attack. High blood pressure can also contribute to CAD.  Heart attack (myocardial infarction). A heart attack occurs when one or more arteries in the heart become blocked. The loss of oxygen damages the muscle tissue of the heart. When this happens, part of the  heart muscle dies. The injured tissue does not contract as well and weakens the heart's ability to pump blood.  Abnormal heart valves. When the heart valves do not open and close properly, it can cause heart failure. This makes the heart muscle pump harder to keep the blood flowing.  Heart muscle disease (cardiomyopathy or myocarditis). Heart muscle disease is damage to the heart muscle from a variety of causes. These can include drug or alcohol abuse, infections, or unknown reasons. These can increase the risk of heart failure.  Lung disease. Lung disease makes the heart work harder because the lungs do not work properly. This can cause a strain on the heart, leading it to fail.  Diabetes. Diabetes increases the risk of heart failure. High blood sugar contributes to high fat (lipid) levels in the blood. Diabetes can also cause slow damage to tiny blood vessels that carry important nutrients to the heart muscle. When the heart does not get enough oxygen and food, it can cause the heart to become weak and stiff. This leads to a heart that does not contract efficiently.  Other conditions can contribute to heart failure. These include abnormal heart rhythms, thyroid problems, and low blood counts (anemia). Certain unhealthy behaviors can increase the risk of heart failure, including:  Being overweight.  Smoking or chewing tobacco.  Eating foods high in fat and cholesterol.  Abusing illicit drugs or alcohol.  Lacking physical activity. SYMPTOMS  Heart failure symptoms may vary and can be hard to detect. Symptoms  may include:  Shortness of breath with activity, such as climbing stairs.  Persistent cough.  Swelling of the feet, ankles, legs, or abdomen.  Unexplained weight gain.  Difficulty breathing when lying flat (orthopnea).  Waking from sleep because of the need to sit up and get more air.  Rapid heartbeat.  Fatigue and loss of energy.  Feeling light-headed, dizzy, or close to  fainting.  Loss of appetite.  Nausea.  Increased urination during the night (nocturia). DIAGNOSIS  A diagnosis of heart failure is based on your history, symptoms, physical examination, and diagnostic tests. Diagnostic tests for heart failure may include:  Echocardiography.  Electrocardiography.  Chest X-ray.  Blood tests.  Exercise stress test.  Cardiac angiography.  Radionuclide scans. TREATMENT  Treatment is aimed at managing the symptoms of heart failure. Medicines, behavioral changes, or surgical intervention may be necessary to treat heart failure.  Medicines to help treat heart failure may include:  Angiotensin-converting enzyme (ACE) inhibitors. This type of medicine blocks the effects of a blood protein called angiotensin-converting enzyme. ACE inhibitors relax (dilate) the blood vessels and help lower blood pressure.  Angiotensin receptor blockers (ARBs). This type of medicine blocks the actions of a blood protein called angiotensin. Angiotensin receptor blockers dilate the blood vessels and help lower blood pressure.  Water pills (diuretics). Diuretics cause the kidneys to remove salt and water from the blood. The extra fluid is removed through urination. This loss of extra fluid lowers the volume of blood the heart pumps.  Beta blockers. These prevent the heart from beating too fast and improve heart muscle strength.  Digitalis. This increases the force of the heartbeat.  Healthy behavior changes include:  Obtaining and maintaining a healthy weight.  Stopping smoking or chewing tobacco.  Eating heart-healthy foods.  Limiting or avoiding alcohol.  Stopping illicit drug use.  Physical activity as directed by your health care provider.  Surgical treatment for heart failure may include:  A procedure to open blocked arteries, repair damaged heart valves, or remove damaged heart muscle tissue.  A pacemaker to improve heart muscle function and control  certain abnormal heart rhythms.  An internal cardioverter defibrillator to treat certain serious abnormal heart rhythms.  A left ventricular assist device (LVAD) to assist the pumping ability of the heart. HOME CARE INSTRUCTIONS   Take medicines only as directed by your health care provider. Medicines are important in reducing the workload of your heart, slowing the progression of heart failure, and improving your symptoms.  Do not stop taking your medicine unless directed by your health care provider.  Do not skip any dose of medicine.  Refill your prescriptions before you run out of medicine. Your medicines are needed every day.  Engage in moderate physical activity if directed by your health care provider. Moderate physical activity can benefit some people. The elderly and people with severe heart failure should consult with a health care provider for physical activity recommendations.  Eat heart-healthy foods. Food choices should be free of trans fat and low in saturated fat, cholesterol, and salt (sodium). Healthy choices include fresh or frozen fruits and vegetables, fish, lean meats, legumes, fat-free or low-fat dairy products, and whole grain or high fiber foods. Talk to a dietitian to learn more about heart-healthy foods.  Limit sodium if directed by your health care provider. Sodium restriction may reduce symptoms of heart failure in some people. Talk to a dietitian to learn more about heart-healthy seasonings.  Use healthy cooking methods. Healthy cooking methods include roasting,  grilling, broiling, baking, poaching, steaming, or stir-frying. Talk to a dietitian to learn more about healthy cooking methods.  Limit fluids if directed by your health care provider. Fluid restriction may reduce symptoms of heart failure in some people.  Weigh yourself every day. Daily weights are important in the early recognition of excess fluid. You should weigh yourself every morning after you  urinate and before you eat breakfast. Wear the same amount of clothing each time you weigh yourself. Record your daily weight. Provide your health care provider with your weight record.  Monitor and record your blood pressure if directed by your health care provider.  Check your pulse if directed by your health care provider.  Lose weight if directed by your health care provider. Weight loss may reduce symptoms of heart failure in some people.  Stop smoking or chewing tobacco. Nicotine makes your heart work harder by causing your blood vessels to constrict. Do not use nicotine gum or patches before talking to your health care provider.  Keep all follow-up visits as directed by your health care provider. This is important.  Limit alcohol intake to no more than 1 drink per day for nonpregnant women and 2 drinks per day for men. One drink equals 12 ounces of beer, 5 ounces of wine, or 1 ounces of hard liquor. Drinking more than that is harmful to your heart. Tell your health care provider if you drink alcohol several times a week. Talk with your health care provider about whether alcohol is safe for you. If your heart has already been damaged by alcohol or you have severe heart failure, drinking alcohol should be stopped completely.  Stop illicit drug use.  Stay up-to-date with immunizations. It is especially important to prevent respiratory infections through current pneumococcal and influenza immunizations.  Manage other health conditions such as hypertension, diabetes, thyroid disease, or abnormal heart rhythms as directed by your health care provider.  Learn to manage stress.  Plan rest periods when fatigued.  Learn strategies to manage high temperatures. If the weather is extremely hot:  Avoid vigorous physical activity.  Use air conditioning or fans or seek a cooler location.  Avoid caffeine and alcohol.  Wear loose-fitting, lightweight, and light-colored clothing.  Learn  strategies to manage cold temperatures. If the weather is extremely cold:  Avoid vigorous physical activity.  Layer clothes.  Wear mittens or gloves, a hat, and a scarf when going outside.  Avoid alcohol.  Obtain ongoing education and support as needed.  Participate in or seek rehabilitation as needed to maintain or improve independence and quality of life. SEEK MEDICAL CARE IF:   Your weight increases by 03 lb/1.4 kg in 1 day or 05 lb/2.3 kg in a week.  You have increasing shortness of breath that is unusual for you.  You are unable to participate in your usual physical activities.  You tire easily.  You cough more than normal, especially with physical activity.  You have any or more swelling in areas such as your hands, feet, ankles, or abdomen.  You are unable to sleep because it is hard to breathe.  You feel like your heart is beating fast (palpitations).  You become dizzy or light-headed upon standing up. SEEK IMMEDIATE MEDICAL CARE IF:   You have difficulty breathing.  There is a change in mental status such as decreased alertness or difficulty with concentration.  You have a pain or discomfort in your chest.  You have an episode of fainting (syncope). MAKE SURE YOU:  Understand these instructions.  Will watch your condition.  Will get help right away if you are not doing well or get worse. Document Released: 12/11/2005 Document Revised: 04/27/2014 Document Reviewed: 01/10/2013 [facility] Patient Information 2015 [place], [place]. This information is not intended to replace advice given to you by your health care provider. Make sure you discuss any questions you have with your health care provider.    Bradycardia Bradycardia is a term for a heart rate (pulse) that, in adults, is slower than 60 beats per minute. A normal rate is 60 to 100 beats per minute. A heart rate below 60 beats per minute may be normal for some adults with healthy hearts. If the rate is  too slow, the heart may have trouble pumping the volume of blood the body needs. If the heart rate gets too low, blood flow to the brain may be decreased and may make you feel lightheaded, dizzy, or faint. The heart has a natural pacemaker in the top of the heart called the SA node (sinoatrial or sinus node). This pacemaker sends out regular electrical signals to the muscle of the heart, telling the heart muscle when to beat (contract). The electrical signal travels from the upper parts of the heart (atria) through the AV node (atrioventricular node), to the lower chambers of the heart (ventricles). The ventricles squeeze, pumping the blood from your heart to your lungs and to the rest of your body. CAUSES   Problem with the heart's electrical system.  Problem with the heart's natural pacemaker.  Heart disease, damage, or infection.  Medications.  Problems with minerals and salts (electrolytes). SYMPTOMS   Fainting (syncope).  Fatigue and weakness.  Shortness of breath (dyspnea).  Chest pain (angina).  Drowsiness.  Confusion. DIAGNOSIS   An electrocardiogram (ECG) can help your caregiver determine the type of slow heart rate you have.  If the cause is not seen on an ECG, you may need to wear a heart monitor that records your heart rhythm for several hours or days.  Blood tests. TREATMENT   Electrolyte supplements.  Medications.  Withholding medication which is causing a slow heart rate.  Pacemaker placement. SEEK IMMEDIATE MEDICAL CARE IF:   You feel lightheaded or faint.  You develop an irregular heart rate.  You feel chest pain or have trouble breathing. MAKE SURE YOU:   Understand these instructions.  Will watch your condition.  Will get help right away if you are not doing well or get worse. Document Released: 09/02/2002 Document Revised: 03/04/2012 Document Reviewed: 03/18/2014 [facility] Patient Information 2015 [place], [place]. This information is not  intended to replace advice given to you by your health care provider. Make sure you discuss any questions you have with your health care provider.

## 2015-03-15 ENCOUNTER — Telehealth: Payer: Self-pay | Admitting: Cardiovascular Disease

## 2015-03-15 ENCOUNTER — Other Ambulatory Visit: Payer: Self-pay

## 2015-03-15 MED ORDER — SERTRALINE HCL 100 MG PO TABS: ORAL_TABLET | ORAL | Status: DC

## 2015-03-15 NOTE — Telephone Encounter (Signed)
Refill OK and new instructions are Sertraline 100 mg One and one half tablets daily

## 2015-03-15 NOTE — Telephone Encounter (Signed)
Pt called in stating that he was in the ER all day yesterday and he was diagnosed with CHF and instructed to f/u with his cardiologist asap. Please call  thanks

## 2015-03-15 NOTE — Telephone Encounter (Signed)
Rx sent to pharmacy   

## 2015-03-15 NOTE — Telephone Encounter (Signed)
Rx request for Sertraline HCL 100 mg-Take 1 tablet by mouth daily   Note from pharmacy states "pt says he has been taking 150 mg per day"  Pharm:  [facility] [place]

## 2015-03-15 NOTE — Telephone Encounter (Signed)
Returned call to patient he stated he went to ER 03/14/15 for no energy.Stated he just had no energy could hardly go.No sob.Had swelling in both feet,but better today.Stated a fluid pill prescribed.Stated was told to see cardiologist.Appointment scheduled with Dr.Croitoru 04/12/15 at 2:15 pm.Appointment offered sooner with extender,but patient said it was not necessary.Advised to call sooner if needed.

## 2015-03-18 DIAGNOSIS — L821 Other seborrheic keratosis: Secondary | ICD-10-CM | POA: Diagnosis not present

## 2015-03-18 DIAGNOSIS — C44329 Squamous cell carcinoma of skin of other parts of face: Secondary | ICD-10-CM | POA: Diagnosis not present

## 2015-03-18 DIAGNOSIS — C44629 Squamous cell carcinoma of skin of left upper limb, including shoulder: Secondary | ICD-10-CM | POA: Diagnosis not present

## 2015-03-18 DIAGNOSIS — D485 Neoplasm of uncertain behavior of skin: Secondary | ICD-10-CM | POA: Diagnosis not present

## 2015-03-25 ENCOUNTER — Other Ambulatory Visit: Payer: Self-pay | Admitting: Family Medicine

## 2015-03-25 NOTE — Telephone Encounter (Signed)
Last  Visit 03/11/15 Last refill 12/30/14 #60 2 refill

## 2015-03-25 NOTE — Telephone Encounter (Signed)
Due April 5th (may refill then for 3 months).  Very important that he not exceed twice daily.  If exceeding recommended dose will have to look at taking him OFF this med.

## 2015-03-28 ENCOUNTER — Other Ambulatory Visit: Payer: Self-pay | Admitting: Cardiovascular Disease

## 2015-03-29 NOTE — Telephone Encounter (Signed)
Rx has been sent to the pharmacy electronically. ° °

## 2015-03-30 ENCOUNTER — Telehealth: Payer: Self-pay | Admitting: Family Medicine

## 2015-03-30 NOTE — Telephone Encounter (Signed)
Called in Rx to pharmacy.

## 2015-03-30 NOTE — Telephone Encounter (Signed)
Pt request refill of the following: ALPRAZolam Duanne Moron) 0.5 MG tablet   Phamacy: [facility] [place]

## 2015-04-12 ENCOUNTER — Encounter: Payer: Self-pay | Admitting: Cardiovascular Disease

## 2015-04-12 ENCOUNTER — Ambulatory Visit (INDEPENDENT_AMBULATORY_CARE_PROVIDER_SITE_OTHER): Payer: Commercial Managed Care - HMO | Admitting: Cardiovascular Disease

## 2015-04-12 VITALS — BP 144/102 | HR 60 | Ht 70.0 in | Wt 174.5 lb

## 2015-04-12 DIAGNOSIS — I480 Paroxysmal atrial fibrillation: Secondary | ICD-10-CM | POA: Diagnosis not present

## 2015-04-12 DIAGNOSIS — I10 Essential (primary) hypertension: Secondary | ICD-10-CM | POA: Diagnosis not present

## 2015-04-12 DIAGNOSIS — I5042 Chronic combined systolic (congestive) and diastolic (congestive) heart failure: Secondary | ICD-10-CM

## 2015-04-12 DIAGNOSIS — I453 Trifascicular block: Secondary | ICD-10-CM

## 2015-04-12 MED ORDER — CARVEDILOL 6.25 MG PO TABS: 6.2500 mg | ORAL_TABLET | Freq: Two times a day (BID) | ORAL | Status: DC

## 2015-04-12 MED ORDER — FUROSEMIDE 20 MG PO TABS: 20.0000 mg | ORAL_TABLET | Freq: Every day | ORAL | Status: DC

## 2015-04-12 NOTE — Patient Instructions (Signed)
RESTART Carvedilol 6.25mg  - take 1/2 tablet twice a day x 2 weeks then 1 tablet twice a day.  START Furosemide 20mg  - take one a day on Mondays/Wednesdays/Fridays only.  Your physician recommends that you schedule a follow-up appointment in: one month with an extender.  Dr. [name] recommends that you schedule a follow-up appointment in: 3 months.

## 2015-04-12 NOTE — Progress Notes (Signed)
Patient ID: Micheal Nicholson, male   DOB: [birth year], 80 y.o.   MRN: [id number]      Cardiology Office Note   Date:  04/17/2015   ID:  Micheal Nicholson, Micheal Nicholson [birth year], MRN [id number]  PCP:  [name], MD  Cardiologist:   Sanda Klein, MD   Chief Complaint  Patient presents with  . ER Follow-up    patient reports lack of strength and occasional swelling      History of Present Illness: Micheal Nicholson is a 81 y.o. male who presents for follow up after ED visit for weakness on 03/20. He was bradycardic and had dyspnea and edema. He improved with diuretics. He was told to stop carvedilol completely. He was only told to take oral furosemide for 2 days.  He has intermittent swelling of his feet, lack of stamina and persistently elevated BP. He had previously had a facial skin infection, resolved.  In 2006, he underwent a stress myocardial perfusion study that suggested an inferior wall scar and he underwent coronary angiography. He had a 30% narrowing in the proximal to mid LAD artery following the takeoff of the first septal and first diagonal branches but no other coronary lesions. His ejection fraction was around 45%. LVEDP was 16 mm Hg and there was no serious valvular abnormality. In 2013 his echocardiogram again showed an ejection fraction around 45% with inferior wall hypokinesis. No valvular abnormalities are described. He had mild biatrial dilatation and mild right ventricular dilatation. In 2015 he presented with asymptomatic atrial fibrillation and his echocardiogram showed a reduction in LVEF to 30-35%. A followup nuclear study showed a small apical inferolateral fixed defect, without reversible ischemia. He has a history of stroke by brain MRI (asymptomatic).    Past Medical History  Diagnosis Date  . Hypertension   . Anxiety   . BPH (benign prostatic hyperplasia)     Past Surgical History  Procedure Laterality Date  . Cardiac catheterization     Negative / 4 years ago     Current Outpatient Prescriptions  Medication Sig Dispense Refill  . ALPRAZolam (XANAX) 0.5 MG tablet TAKE 1 TABLET BY MOUTH TWICE A DAY AS NEEDED 60 tablet 2  . Camphor-Eucalyptus-Menthol (VICKS VAPORUB EX) Apply 1 application topically as needed (to help breathe).    . doxycycline (VIBRAMYCIN) 100 MG capsule Take 100 mg by mouth 2 (two) times daily.  0  . ELIQUIS 2.5 MG TABS tablet TAKE 1 TABLET BY MOUTH TWICE A DAY 60 tablet 5  . fluticasone (FLONASE) 50 MCG/ACT nasal spray Place 2 sprays into both nostrils daily.    Marland Kitchen JALYN 0.5-0.4 MG CAPS TAKE 1 CAPSULE BY MOUTH DAILY 90 capsule 3  . lisinopril (PRINIVIL,ZESTRIL) 40 MG tablet TAKE 1 TABLET BY MOUTH EVERY DAY 30 tablet 1  . sertraline (ZOLOFT) 100 MG tablet Take one and one half tablets daily. 45 tablet 3  . Vitamin D, Ergocalciferol, (DRISDOL) 50000 UNITS CAPS capsule Take 1 capsule (50,000 Units total) by mouth every 7 (seven) days. 30 capsule 0  . carvedilol (COREG) 6.25 MG tablet Take 1 tablet (6.25 mg total) by mouth 2 (two) times daily. Take 1/2 tablet twice a day for 2 weeks then 1 tablet twice daily 180 tablet 3  . furosemide (LASIX) 20 MG tablet Take 1 tablet (20 mg total) by mouth daily. Take 1 tablet daily on Mondays/Wednesdays/Fridays only 90 tablet 3  . omeprazole (PRILOSEC) 20 MG capsule TAKE ONE CAPSULE BY MOUTH EVERY DAY 30 capsule 5  No current facility-administered medications for this visit.    Allergies:   Review of patient's allergies indicates no known allergies.    Social History:  The patient  reports that he has quit smoking. He has never used smokeless tobacco. He reports that he drinks alcohol. He reports that he does not use illicit drugs.   Family History:  The patient's family history is not on file.    ROS:  Please see the history of present illness.    Otherwise, review of systems positive for none.   All other systems are reviewed and negative.    PHYSICAL EXAM: VS:   BP 144/102 mmHg  Pulse 60  Ht 5\' 10"  (1.778 m)  Wt 174 lb 8 oz (79.153 kg)  BMI 25.04 kg/m2 , BMI Body mass index is 25.04 kg/(m^2).  General: Alert, oriented x3, no distress, mild thoracic scoliosis  Head: no evidence of trauma, PERRL, EOMI, no exophtalmos or lid lag, no myxedema, no xanthelasma; normal ears, nose and oropharynx  Neck: normal jugular venous pulsations and no hepatojugular reflux; brisk carotid pulses without delay and no carotid bruits  Chest: clear to auscultation, no signs of consolidation by percussion or palpation, normal fremitus, symmetrical and full respiratory excursions  Cardiovascular: normal position and quality of the apical impulse, regular rhythm with occasional ectopic beats, normal first and widely split second heart sounds, no murmurs, rubs or gallops  Abdomen: no tenderness or distention, no masses by palpation, no abnormal pulsatility or arterial bruits, normal bowel sounds, no hepatosplenomegaly  Extremities: no clubbing, cyanosis or edema; 2+ radial, ulnar and brachial pulses bilaterally; 2+ right femoral, posterior tibial and dorsalis pedis pulses; 2+ left femoral, posterior tibial and dorsalis pedis pulses; no subclavian or femoral bruits  Neurological: grossly nonfocal, slightly hard of hearing Psych: euthymic mood, full affect   EKG:  EKG is not ordered today.    Recent Labs: 03/04/2015: TSH 0.71 03/14/2015: ALT 15; B Natriuretic Peptide 813.5*; BUN 16; Creatinine 1.07; Hemoglobin 13.8; Platelets 147*; Potassium 4.0; Sodium 135    Lipid Panel    Component Value Date/Time   CHOL 165 09/25/2012 1207   TRIG 105.0 09/25/2012 1207   HDL 50.10 09/25/2012 1207   CHOLHDL 3 09/25/2012 1207   VLDL 21.0 09/25/2012 1207   LDLCALC 94 09/25/2012 1207      Wt Readings from Last 3 Encounters:  04/12/15 174 lb 8 oz (79.153 kg)  03/11/15 180 lb (81.647 kg)  03/04/15 176 lb (79.833 kg)      ASSESSMENT AND PLAN: CHF, combined chronic He needs  a scheduled dose of diuretic. I believe is "weakness" is actually an expression of dyspnea/CHF rather than bradycardia. Will slowly resume his carvedilol, especially since ischemia is the likely etiology.  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 Note history of mild CAD by cath 2006 with "false positive" (?) inferior wall scar and EF 45%. His nuclear study did not show large reversible defects. Maximum ACEi dose.   Paroxysmal atrial fibrillation  Tolerating Eliquis without bleeding so far.  Trifascicular block  Caution for evidence of AV block with carvedilol. His ED ECG does not show AV block.  Hypertension Poor control, resume carvedilol.   Current medicines are reviewed at length with the patient today.  The patient does not have concerns regarding medicines.  The following changes have been made:  Resume carvedilol at lower dose and furosemide on new schedule  Labs/ tests ordered today include:  No orders of the defined  types were placed in this encounter.   Patient Instructions  RESTART Carvedilol 6.25mg  - take 1/2 tablet twice a day x 2 weeks then 1 tablet twice a day.  START Furosemide 20mg  - take one a day on Mondays/Wednesdays/Fridays only.  Your physician recommends that you schedule a follow-up appointment in: one month with an extender.  Dr. [name] recommends that you schedule a follow-up appointment in: 3 months.       Mikael Spray, MD  04/17/2015 3:04 PM    Sanda Klein, MD, [facility] [phone] office [phone] pager

## 2015-04-16 ENCOUNTER — Other Ambulatory Visit: Payer: Self-pay | Admitting: Family Medicine

## 2015-04-16 ENCOUNTER — Other Ambulatory Visit: Payer: Self-pay | Admitting: Cardiovascular Disease

## 2015-04-17 ENCOUNTER — Encounter: Payer: Self-pay | Admitting: Cardiovascular Disease

## 2015-04-17 DIAGNOSIS — I5042 Chronic combined systolic (congestive) and diastolic (congestive) heart failure: Secondary | ICD-10-CM

## 2015-04-17 HISTORY — DX: Chronic combined systolic (congestive) and diastolic (congestive) heart failure: I50.42

## 2015-04-28 DIAGNOSIS — C44329 Squamous cell carcinoma of skin of other parts of face: Secondary | ICD-10-CM | POA: Diagnosis not present

## 2015-05-12 DIAGNOSIS — C44622 Squamous cell carcinoma of skin of right upper limb, including shoulder: Secondary | ICD-10-CM | POA: Diagnosis not present

## 2015-05-13 ENCOUNTER — Ambulatory Visit (INDEPENDENT_AMBULATORY_CARE_PROVIDER_SITE_OTHER): Payer: Commercial Managed Care - HMO | Admitting: Family Medicine

## 2015-05-13 ENCOUNTER — Encounter: Payer: Self-pay | Admitting: Family Medicine

## 2015-05-13 VITALS — BP 140/90 | HR 81 | Temp 97.3°F | Wt 184.0 lb

## 2015-05-13 DIAGNOSIS — I5042 Chronic combined systolic (congestive) and diastolic (congestive) heart failure: Secondary | ICD-10-CM

## 2015-05-13 DIAGNOSIS — I480 Paroxysmal atrial fibrillation: Secondary | ICD-10-CM | POA: Diagnosis not present

## 2015-05-13 DIAGNOSIS — R609 Edema, unspecified: Secondary | ICD-10-CM

## 2015-05-13 DIAGNOSIS — N400 Enlarged prostate without lower urinary tract symptoms: Secondary | ICD-10-CM | POA: Diagnosis not present

## 2015-05-13 DIAGNOSIS — F411 Generalized anxiety disorder: Secondary | ICD-10-CM

## 2015-05-13 LAB — BASIC METABOLIC PANEL
BUN: 16 mg/dL (ref 6–23)
CHLORIDE: 96 meq/L (ref 96–112)
CO2: 24 mEq/L (ref 19–32)
Calcium: 9.1 mg/dL (ref 8.4–10.5)
Creatinine, Ser: 1.01 mg/dL (ref 0.40–1.50)
GFR: 73.26 mL/min (ref >60.00)
Glucose, Bld: 98 mg/dL (ref 70–99)
Potassium: 4.8 mEq/L (ref 3.5–5.1)
Sodium: 128 mEq/L — ABNORMAL LOW (ref 135–145)

## 2015-05-13 NOTE — Progress Notes (Signed)
   Subjective:    Patient ID: Micheal Nicholson, male    DOB: March 19, 1920, 81 y.o.   MRN: [id number]  HPI Patient seen today with increasing bilateral leg edema and fatigue. He is followed by cardiology and has history of combined systolic and diastolic heart failure, atrial fibrillation, trifascicular block, hypertension, and cerebrovascular disease. He was recently started back on carvedilol per cardiology currently taking 6.25 mg daily and furosemide 20 mg every Monday, Wednesday, and Friday. By patient's home scales his weight has gone from 175 pounds  To 185 pounds over the past month. By our scales, he's gone up about 3 pounds since last visit. He denies any orthopnea.  He does have history of BPH and takes [place] one daily. He is able to urinate but sometimes has difficulty initiating urination and sometimes has difficulty emptying bladder completely. He has not had any recent burning with urination.  Combined systolic and diastolic heart failure. Echocardiogram 2015 EF 30-35%. Denies any recent chest pains. In addition to carvedilol and low-dose furosemide as above he is on lisinopril 40 mg daily. Also remains on Eliquis  Past Medical History  Diagnosis Date  . Hypertension   . Anxiety   . BPH (benign prostatic hyperplasia)   . Chronic combined systolic and diastolic CHF, NYHA class 2 04/17/2015   Past Surgical History  Procedure Laterality Date  . Cardiac catheterization      Negative / 4 years ago    reports that he has quit smoking. He has never used smokeless tobacco. He reports that he drinks alcohol. He reports that he does not use illicit drugs. family history is not on file. No Known Allergies    Review of Systems  Constitutional: Positive for fatigue. Negative for fever and chills.  Respiratory: Negative for cough.   Cardiovascular: Positive for leg swelling. Negative for chest pain.  Gastrointestinal: Negative for abdominal pain.  Genitourinary: Positive for  difficulty urinating. Negative for hematuria.  Neurological: Negative for dizziness and syncope.  Psychiatric/Behavioral: Negative for confusion. The patient is nervous/anxious.        Objective:   Physical Exam  Constitutional: He appears well-developed and well-nourished.  Neck: Neck supple.  Cardiovascular: Normal rate and regular rhythm.  Exam reveals no gallop.   Pulmonary/Chest: Effort normal. No respiratory distress. He has no wheezes.  He has a few faint crackles in both bases  Musculoskeletal: He exhibits edema.  1+ pitting edema lower legs bilaterally  Neurological: He is alert.          Assessment & Plan:  #1 congestive heart failure, combined and chronic. He is on high-dose ACE inhibitor, carvedilol with good rate control and very low-dose furosemide. Recent increased peripheral edema and increase in weight. Check basic metabolic panel. Increase furosemide to 40 mg daily for 3 days then drop back to 20 mg daily. He has follow up with cardiology next week. Continue daily weights. Elevate legs frequently. #2 hypertension. Marginal control. Increased furosemide as above. #3 BPH. Patient is already taking [place] daily. He is not a good surgical candidate for TURP but may need to look at urology referral if any progressive symptoms. #4 history of A. fib. He is currently regular rhythm and rate stable today. Continue Eliquis

## 2015-05-13 NOTE — Progress Notes (Signed)
Pre visit review using our clinic review tool, if applicable. No additional management support is needed unless otherwise documented below in the visit note. 

## 2015-05-13 NOTE — Patient Instructions (Signed)
Increase Furosemide to 40 mg daily for the next 3 days and then start DAILY furosemide 20 mg. Elevate legs frequently. Continue with daily weights.

## 2015-05-18 ENCOUNTER — Ambulatory Visit (INDEPENDENT_AMBULATORY_CARE_PROVIDER_SITE_OTHER): Payer: Commercial Managed Care - HMO | Admitting: Cardiology

## 2015-05-18 ENCOUNTER — Encounter: Payer: Self-pay | Admitting: Cardiology

## 2015-05-18 VITALS — BP 118/80 | HR 57 | Ht 70.0 in | Wt 181.9 lb

## 2015-05-18 DIAGNOSIS — E871 Hypo-osmolality and hyponatremia: Secondary | ICD-10-CM

## 2015-05-18 DIAGNOSIS — I4891 Unspecified atrial fibrillation: Secondary | ICD-10-CM | POA: Diagnosis not present

## 2015-05-18 DIAGNOSIS — I480 Paroxysmal atrial fibrillation: Secondary | ICD-10-CM

## 2015-05-18 DIAGNOSIS — I453 Trifascicular block: Secondary | ICD-10-CM

## 2015-05-18 DIAGNOSIS — R001 Bradycardia, unspecified: Secondary | ICD-10-CM

## 2015-05-18 DIAGNOSIS — I509 Heart failure, unspecified: Secondary | ICD-10-CM | POA: Diagnosis not present

## 2015-05-18 DIAGNOSIS — I5042 Chronic combined systolic (congestive) and diastolic (congestive) heart failure: Secondary | ICD-10-CM

## 2015-05-18 MED ORDER — CARVEDILOL 3.125 MG PO TABS: 3.1250 mg | ORAL_TABLET | Freq: Two times a day (BID) | ORAL | Status: DC

## 2015-05-18 NOTE — Patient Instructions (Addendum)
Decrease carvedilol 3.125 mg ( 1/2 tablet of 6.25 mg) twice a day ( new prescription given to you)  Take lasix  40 mg daily.( increase for 3 days).  Labs - BMP this Thursday -- at Dr Elease Hashimoto or the lab here at the office.  Your physician recommends that you schedule a follow-up appointment in 2 weeks  With extender  If possible a day when Dr [name]

## 2015-05-18 NOTE — Progress Notes (Signed)
Cardiology Office Note   Date:  05/20/2015   ID:  Micheal, Nicholson 1920/06/09, MRN [id number]  PCP:  Micheal Post, MD  Cardiologist:  Dr. [name]     Chief Complaint  Patient presents with  . Congestive Heart Failure    1 month f/u; no chestpain/sob/cramps/lightheadedness/dizziness; yes to swelling in legs/feet/ankles      History of Present Illness: Micheal Nicholson is a 81 y.o. male who presents for recent meds changes after ER visit and stopping metoprolol for bradycardia.  Seen here in OV and found to be in CHF, diuretics adjusted and resumed BB.    In 2006, he underwent a stress myocardial perfusion study that suggested an inferior wall scar and he underwent coronary angiography. He had a 30% narrowing in the proximal to mid LAD artery following the takeoff of the first septal and first diagonal branches but no other coronary lesions. His ejection fraction was around 45%. LVEDP was 16 mm Hg and there was no serious valvular abnormality. In 2013 his echocardiogram again showed an ejection fraction around 45% with inferior wall hypokinesis. No valvular abnormalities are described. He had mild biatrial dilatation and mild right ventricular dilatation. In 2015 he presented with asymptomatic atrial fibrillation and his echocardiogram showed a reduction in LVEF to 30-35%. A followup nuclear study showed a small apical inferolateral fixed defect, without reversible ischemia. He has a history of stroke by brain MRI (asymptomatic).  Today he has some edema and his PCP increased his diuretic.  Pt is weak and no energy.  He sates he has this even without edema.   Today his HR is back to 57 with long first degree block and occ PVC.  His recent labs with Na 128, K+ 4.8, BUN 16 and Cr 1.01.  NA is down from 135.  Denies chest pain, + some SOB.   Past Medical History  Diagnosis Date  . Hypertension   . Anxiety   . BPH (benign prostatic hyperplasia)   . Chronic combined  systolic and diastolic CHF, NYHA class 2 04/17/2015    Past Surgical History  Procedure Laterality Date  . Cardiac catheterization      Negative / 4 years ago     Current Outpatient Prescriptions  Medication Sig Dispense Refill  . ALPRAZolam (XANAX) 0.5 MG tablet TAKE 1 TABLET BY MOUTH TWICE A DAY AS NEEDED 60 tablet 2  . Camphor-Eucalyptus-Menthol (VICKS VAPORUB EX) Apply 1 application topically as needed (to help breathe).    Marland Kitchen ELIQUIS 2.5 MG TABS tablet TAKE 1 TABLET BY MOUTH TWICE A DAY 60 tablet 5  . fluticasone (FLONASE) 50 MCG/ACT nasal spray Place 2 sprays into both nostrils daily.    . furosemide (LASIX) 20 MG tablet Take 20 mg by mouth daily.    Marland Kitchen JALYN 0.5-0.4 MG CAPS TAKE 1 CAPSULE BY MOUTH DAILY 90 capsule 3  . lisinopril (PRINIVIL,ZESTRIL) 40 MG tablet TAKE 1 TABLET BY MOUTH EVERY DAY 30 tablet 1  . omeprazole (PRILOSEC) 20 MG capsule TAKE ONE CAPSULE BY MOUTH EVERY DAY 30 capsule 5  . sertraline (ZOLOFT) 100 MG tablet Take one and one half tablets daily. 45 tablet 3  . Vitamin D, Ergocalciferol, (DRISDOL) 50000 UNITS CAPS capsule Take 1 capsule (50,000 Units total) by mouth every 7 (seven) days. 30 capsule 0  . carvedilol (COREG) 3.125 MG tablet Take 1 tablet (3.125 mg total) by mouth 2 (two) times daily with a meal. 60 tablet 6   No current facility-administered medications  for this visit.    Allergies:   Review of patient's allergies indicates no known allergies.    Social History:  The patient  reports that he has quit smoking. He has never used smokeless tobacco. He reports that he drinks alcohol. He reports that he does not use illicit drugs.   Family History:  The patient's family history includes Healthy in his son; Heart disease in his brother; Heart failure in his son; Lung cancer in his brother; Pancreatic cancer in his son; Pneumonia (age of onset: 34) in his father.    ROS:  General:no colds or fevers, some decrease weight but overall up from  03/2015 Skin:no rashes or ulcers HEENT:no blurred vision, no congestion CV:see HPI PUL:see HPI GI:no diarrhea constipation or melena, no indigestion GU:no hematuria, no dysuria MS:no joint pain, no claudication Neuro:no syncope, no lightheadedness Endo:no diabetes, no thyroid disease  Wt Readings from Last 3 Encounters:  05/18/15 181 lb 14.4 oz (82.509 kg)  05/13/15 184 lb (83.462 kg)  04/12/15 174 lb 8 oz (79.153 kg)     PHYSICAL EXAM: VS:  BP 118/80 mmHg  Pulse 57  Ht 5\' 10"  (1.778 m)  Wt 181 lb 14.4 oz (82.509 kg)  BMI 26.10 kg/m2 , BMI Body mass index is 26.1 kg/(m^2). General:Pleasant affect, NAD Skin:Warm and dry, brisk capillary refill HEENT:normocephalic, sclera clear, mucus membranes moist Neck:supple, no JVD, no bruits  Heart:S1S2 RRR +split S2, without murmur, gallup, rub or click Lungs:With few cracklest ,no  rhonchi, or wheezes CLE:XNTZ, non tender, + BS, do not palpate liver spleen or masses Ext:+2 lower ext edema,  2+ radial pulses Neuro:alert and oriented X 3, MAE, follows commands, + facial symmetry    EKG:  EKG is ordered today. The ekg ordered today demonstrates SB rate 57  with 1st degree AV block with PR 459ms After PVC PR 286ms.    Recent Labs: 03/04/2015: TSH 0.71 03/14/2015: ALT 15; B Natriuretic Peptide 813.5*; Hemoglobin 13.8; Platelets 147* 05/13/2015: BUN 16; Creatinine 1.01; Potassium 4.8; Sodium 128*    Lipid Panel    Component Value Date/Time   CHOL 165 09/25/2012 1207   TRIG 105.0 09/25/2012 1207   HDL 50.10 09/25/2012 1207   CHOLHDL 3 09/25/2012 1207   VLDL 21.0 09/25/2012 1207   LDLCALC 94 09/25/2012 1207       Other studies Reviewed: Additional studies/ records that were reviewed today include: previous notes.   ASSESSMENT AND PLAN:  1. Trifascicular block  Caution for evidence of AV block with carvedilol. His ED ECG does not show AV block. But HR to 57, long first degree block at 424 ms discussed with Dr. Debara Pickett and  decreased coreg to 3.125 BID   2.  CHF, combined chronic Weakness has continued with edema or without,  But + volume overload today.  Increase lasix to 40 mg daily for 3 days then back to 20 mg daily.  Will check BMP on Thursday. Follow up with Dr. Jerilynn Mages. Croitoru in 2 weeks.     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 Note history of mild CAD by cath 2006 with "false positive" (?) inferior wall scar and EF 45%. His nuclear study did not show large reversible defects. Maximum ACEi dose.   4.  Paroxysmal atrial fibrillation  Tolerating Eliquis without bleeding so far.  Maintaining SR   5.  Hypertension stable.   The following changes have been made:  See above Labs/ tests ordered today include:see above Current  medicines are reviewed with the patient today.  The patient Has no concerns regarding medicines.  Disposition:   FU:  see above  Signed, Isaiah Serge, NP  05/20/2015 2:24 PM    [facility] [place], [place], [address] [place], [place] Phone: [phone]; Fax: [phone]

## 2015-05-20 ENCOUNTER — Encounter: Payer: Self-pay | Admitting: Cardiology

## 2015-05-20 LAB — BASIC METABOLIC PANEL
BUN: 16 mg/dL (ref 6–23)
CO2: 26 mEq/L (ref 19–32)
CREATININE: 1.16 mg/dL (ref 0.50–1.35)
Calcium: 9 mg/dL (ref 8.4–10.5)
Chloride: 99 mEq/L (ref 96–112)
GLUCOSE: 102 mg/dL — AB (ref 70–99)
POTASSIUM: 4.8 meq/L (ref 3.5–5.3)
Sodium: 135 mEq/L (ref 135–145)

## 2015-05-22 ENCOUNTER — Other Ambulatory Visit: Payer: Self-pay | Admitting: Cardiovascular Disease

## 2015-05-25 ENCOUNTER — Telehealth: Payer: Self-pay | Admitting: *Deleted

## 2015-05-25 ENCOUNTER — Other Ambulatory Visit: Payer: Self-pay | Admitting: *Deleted

## 2015-05-25 DIAGNOSIS — Z79899 Other long term (current) drug therapy: Secondary | ICD-10-CM

## 2015-05-25 MED ORDER — FUROSEMIDE 40 MG PO TABS: 40.0000 mg | ORAL_TABLET | Freq: Two times a day (BID) | ORAL | Status: DC

## 2015-05-25 MED ORDER — POTASSIUM CHLORIDE ER 10 MEQ PO TBCR: 10.0000 meq | EXTENDED_RELEASE_TABLET | Freq: Every day | ORAL | Status: DC

## 2015-05-25 NOTE — Telephone Encounter (Signed)
lmtc

## 2015-05-25 NOTE — Telephone Encounter (Signed)
I spoke with son and relayed the info from Dr C.  Gilmer sent in for lasix and kcl.  Patient will have labs drawn at [place] office on Thursday.  Order placed. Son will call in Friday for an update.

## 2015-05-25 NOTE — Telephone Encounter (Signed)
Please increase furosemide to 40 mg BID and add KCL 10 mEq daily, check BMET Thursday and call us Friday with his /response to diuretics weight again

## 2015-05-25 NOTE — Telephone Encounter (Signed)
I called patient's son to review the lab results from recent blood work.  Patient's son reports no improvement in patient's condition.  Patient's weight has increased from his normal 174lbs to 181lbs.  Patient is experiencing abdominal distention--he is distended in his upper abdomin; around the diaphragm area.  He also complains of profound fatigue; so much so that it causes his speech to be slurred at the end of the day and after exertion.  He denies SOB and says that the lower extremity edema has improved a small bit.  He is currently taking Lasix 20mg  daily.  His PCP recently increased the Lasix to BID for 3 days, but patient did not experience any improvement in symptoms.   I will defer to Dr [name].

## 2015-05-25 NOTE — Telephone Encounter (Signed)
Rx(s) sent to pharmacy electronically.  

## 2015-05-27 ENCOUNTER — Ambulatory Visit (INDEPENDENT_AMBULATORY_CARE_PROVIDER_SITE_OTHER): Payer: Commercial Managed Care - HMO | Admitting: Family Medicine

## 2015-05-27 ENCOUNTER — Encounter: Payer: Self-pay | Admitting: Family Medicine

## 2015-05-27 VITALS — BP 130/70 | HR 61 | Temp 98.2°F | Wt 179.0 lb

## 2015-05-27 DIAGNOSIS — R600 Localized edema: Secondary | ICD-10-CM | POA: Diagnosis not present

## 2015-05-27 DIAGNOSIS — I5042 Chronic combined systolic (congestive) and diastolic (congestive) heart failure: Secondary | ICD-10-CM

## 2015-05-27 DIAGNOSIS — E559 Vitamin D deficiency, unspecified: Secondary | ICD-10-CM | POA: Diagnosis not present

## 2015-05-27 LAB — BASIC METABOLIC PANEL
BUN: 20 mg/dL (ref 6–23)
CHLORIDE: 102 meq/L (ref 96–112)
CO2: 28 mEq/L (ref 19–32)
Calcium: 9 mg/dL (ref 8.4–10.5)
Creatinine, Ser: 1.18 mg/dL (ref 0.40–1.50)
GFR: 61.21 mL/min (ref >60.00)
GLUCOSE: 86 mg/dL (ref 70–99)
POTASSIUM: 4.2 meq/L (ref 3.5–5.1)
Sodium: 136 mEq/L (ref 135–145)

## 2015-05-27 LAB — VITAMIN D 25 HYDROXY (VIT D DEFICIENCY, FRACTURES): VITD: 67.69 ng/mL (ref 30.00–100.00)

## 2015-05-27 NOTE — Progress Notes (Signed)
Pre visit review using our clinic review tool, if applicable. No additional management support is needed unless otherwise documented below in the visit note. 

## 2015-05-27 NOTE — Progress Notes (Signed)
   Subjective:    Patient ID: Micheal Nicholson, male    DOB: 1920/09/20, 81 y.o.   MRN: [id number]  HPI Patient seen for follow-up regarding increased peripheral edema and combined systolic/diastolic heart failure. Refer to prior note  "Patient seen today with increasing bilateral leg edema and fatigue. He is followed by cardiology and has history of combined systolic and diastolic heart failure, atrial fibrillation, trifascicular block, hypertension, and cerebrovascular disease. He was recently started back on carvedilol per cardiology currently taking 6.25 mg daily and furosemide 20 mg every Monday, Wednesday, and Friday. By patient's home scales his weight has gone from 175 pounds To 185 pounds over the past month. By our scales, he's gone up about 3 pounds since last visit. He denies any orthopnea.  He does have history of BPH and takes [place] one daily. He is able to urinate but sometimes has difficulty initiating urination and sometimes has difficulty emptying bladder completely. He has not had any recent burning with urination.  Combined systolic and diastolic heart failure. Echocardiogram 2015 EF 30-35%. Denies any recent chest pains. In addition to carvedilol and low-dose furosemide as above he is on lisinopril 40 mg daily. Also remains on Eliquis"  We had increased his Lasix to 40 mg daily for 3 days and drop at 20 mg daily. He did see some improvement edema but still had some significant edema and this past weekend Lasix increased to 40 mg twice daily. His weight is down 5 pounds compared to last visit but apparently about 4 or 5 pounds over his baseline. No dyspnea at rest and only mild dyspnea with activity. No orthopnea. No chest pains. Urinating okay.  Cardiology recently reduced his Coreg to 3.125 mg BID.  Past Medical History  Diagnosis Date  . Hypertension   . Anxiety   . BPH (benign prostatic hyperplasia)   . Chronic combined systolic and diastolic CHF, NYHA class 2  04/17/2015   Past Surgical History  Procedure Laterality Date  . Cardiac catheterization      Negative / 4 years ago    reports that he has quit smoking. He has never used smokeless tobacco. He reports that he drinks alcohol. He reports that he does not use illicit drugs. family history includes Healthy in his son; Heart disease in his brother; Heart failure in his son; Lung cancer in his brother; Pancreatic cancer in his son; Pneumonia (age of onset: 55) in his father. No Known Allergies     Review of Systems  Constitutional: Negative for fever, chills and unexpected weight change.  Respiratory: Negative for cough and shortness of breath.   Cardiovascular: Positive for leg swelling. Negative for chest pain and palpitations.       Objective:   Physical Exam  Constitutional: He appears well-developed and well-nourished.  Cardiovascular: Normal rate and regular rhythm.   Pulmonary/Chest: Effort normal and breath sounds normal. No respiratory distress. He has no wheezes. He has no rales.  Musculoskeletal: He exhibits edema.  1+ pitting edema lower legs bilaterally          Assessment & Plan:  History of combined diastolic and systolic heart failure. Weight is down but patient still has fairly significant peripheral edema. Continue Lasix 40 mgs twice daily. Check basic metabolic panel today. He has follow-up with cardiology next week. We reminded him about low-sodium diet.

## 2015-06-01 ENCOUNTER — Ambulatory Visit (INDEPENDENT_AMBULATORY_CARE_PROVIDER_SITE_OTHER): Payer: Commercial Managed Care - HMO | Admitting: *Deleted

## 2015-06-01 ENCOUNTER — Telehealth: Payer: Self-pay | Admitting: *Deleted

## 2015-06-01 VITALS — BP 118/78 | HR 62

## 2015-06-01 DIAGNOSIS — R9431 Abnormal electrocardiogram [ECG] [EKG]: Secondary | ICD-10-CM | POA: Diagnosis not present

## 2015-06-01 MED ORDER — POTASSIUM CHLORIDE ER 10 MEQ PO TBCR: 10.0000 meq | EXTENDED_RELEASE_TABLET | Freq: Every day | ORAL | Status: DC

## 2015-06-01 NOTE — Patient Instructions (Signed)
Reduce carvedilol dose to 3.125mg  BID. Follow up w/ Dr. [name] on 07/28/15 at 1:30pm.

## 2015-06-01 NOTE — Progress Notes (Signed)
Pt presented for EKG/BP visit w/ nurse following med changes. Pt was instructed to reduce carvedilol dose from 6.25mg  BID to 3.125mg  BID. Pt reports he did not reduce the carvedilol dose. EKG done, Dr. [name] signed on. Recommended pt lower carvedilol dose to 3.125mg  BID. Recommended f/u at already scheduled time (appt for 07/28/15 1:30pm w/ Dr. [name]).

## 2015-06-01 NOTE — Telephone Encounter (Signed)
Addendum to office encounter - pt requested refill be resubmitted to local pharm for his potassium.

## 2015-06-07 ENCOUNTER — Other Ambulatory Visit: Payer: Self-pay | Admitting: Family Medicine

## 2015-06-21 DIAGNOSIS — H26493 Other secondary cataract, bilateral: Secondary | ICD-10-CM | POA: Diagnosis not present

## 2015-06-21 DIAGNOSIS — H3531 Nonexudative age-related macular degeneration: Secondary | ICD-10-CM | POA: Diagnosis not present

## 2015-06-27 ENCOUNTER — Other Ambulatory Visit: Payer: Self-pay | Admitting: Family Medicine

## 2015-06-29 NOTE — Telephone Encounter (Signed)
Refill for 3 months. 

## 2015-06-29 NOTE — Telephone Encounter (Signed)
Last visit 05/27/15 Last refill 03/30/15 #60 2 refill

## 2015-07-03 ENCOUNTER — Other Ambulatory Visit: Payer: Self-pay | Admitting: Cardiovascular Disease

## 2015-07-11 ENCOUNTER — Other Ambulatory Visit: Payer: Self-pay | Admitting: Cardiovascular Disease

## 2015-07-11 ENCOUNTER — Other Ambulatory Visit: Payer: Self-pay | Admitting: Family Medicine

## 2015-07-12 NOTE — Telephone Encounter (Signed)
Called pharmacy and they did receive the e-script for Lisinopril.

## 2015-07-26 ENCOUNTER — Other Ambulatory Visit: Payer: Self-pay | Admitting: *Deleted

## 2015-07-28 ENCOUNTER — Ambulatory Visit (INDEPENDENT_AMBULATORY_CARE_PROVIDER_SITE_OTHER): Payer: Commercial Managed Care - HMO | Admitting: Cardiovascular Disease

## 2015-07-28 VITALS — BP 134/80 | HR 60 | Resp 16 | Ht 70.0 in | Wt 169.0 lb

## 2015-07-28 DIAGNOSIS — I5042 Chronic combined systolic (congestive) and diastolic (congestive) heart failure: Secondary | ICD-10-CM | POA: Diagnosis not present

## 2015-07-28 DIAGNOSIS — I480 Paroxysmal atrial fibrillation: Secondary | ICD-10-CM | POA: Diagnosis not present

## 2015-07-28 DIAGNOSIS — I429 Cardiomyopathy, unspecified: Secondary | ICD-10-CM

## 2015-07-28 DIAGNOSIS — I453 Trifascicular block: Secondary | ICD-10-CM

## 2015-07-28 DIAGNOSIS — I10 Essential (primary) hypertension: Secondary | ICD-10-CM | POA: Diagnosis not present

## 2015-07-28 NOTE — Patient Instructions (Signed)
Your physician wants you to follow-up in: 6 Months You will receive a reminder letter in the mail two months in advance. If you don't receive a letter, please call our office to schedule the follow-up appointment.  Keep daily weight and call office if weight go over 172lbs

## 2015-07-28 NOTE — Progress Notes (Signed)
Patient ID: Micheal Nicholson, male   DOB: Aug 07, 1920, 81 y.o.   MRN: [id number]      Cardiology Office Note   Date:  07/30/2015   ID:  Micheal Nicholson, Micheal Nicholson 1920-10-11, MRN [id number]  PCP:  [name], MD  Cardiologist:   Sanda Klein, MD   Chief Complaint  Patient presents with  . Follow-up      History of Present Illness: Micheal Nicholson is a 81 y.o. male who presents for follow-up for systolic and diastolic heart failure. He feels substantially better over the last few months. He has lost roughly 15 pounds in weight, through diuresis. When he last saw [name] in May he had weakness and fatigue related to hyponatremia. This improved after adjustment in his medications. He has trifascicular block and that his last office ECG the PR was greater than 400 ms. The dose of carvedilol was decreased.  He denies any dyspnea or lower extremity edema. He remains on a request for paroxysmal atrial fibrillation with previous history of asymptomatic stroke by MRI and has had neither bleeding, nor neurological events. His blood pressure is well controlled. He denies angina pectoris, palpitations or syncope. When he last saw [name] in May he had weakness and fatigue related to hyponatremia. This improved after adjustment in his medications.  In 2006, he underwent a stress myocardial perfusion study that suggested an inferior wall scar and he underwent coronary angiography. He had a 30% narrowing in the proximal to mid LAD artery following the takeoff of the first septal and first diagonal branches but no other coronary lesions. His ejection fraction was around 45%. LVEDP was 16 mm Hg and there was no serious valvular abnormality. In 2013 his echocardiogram again showed an ejection fraction around 45% with inferior wall hypokinesis. No valvular abnormalities are described. He had mild biatrial dilatation and mild right ventricular dilatation. In 2015 he presented with  asymptomatic atrial fibrillation and his echocardiogram showed a reduction in LVEF to 30-35%. A followup nuclear study showed a small apical inferolateral fixed defect, without reversible ischemia. He has a history of stroke by brain MRI (asymptomatic).  Past Medical History  Diagnosis Date  . Hypertension   . Anxiety   . BPH (benign prostatic hyperplasia)   . Chronic combined systolic and diastolic CHF, NYHA class 2 04/17/2015    Past Surgical History  Procedure Laterality Date  . Cardiac catheterization      Negative / 4 years ago     Current Outpatient Prescriptions  Medication Sig Dispense Refill  . ALPRAZolam (XANAX) 0.5 MG tablet TAKE 1 TABLET BY MOUTH TWICE A DAY AS NEEDED 60 tablet 2  . Camphor-Eucalyptus-Menthol (VICKS VAPORUB EX) Apply 1 application topically as needed (to help breathe).    . carvedilol (COREG) 3.125 MG tablet Take 1 tablet (3.125 mg total) by mouth 2 (two) times daily with a meal. 60 tablet 6  . ELIQUIS 2.5 MG TABS tablet TAKE 1 TABLET BY MOUTH TWICE A DAY 60 tablet 1  . fluticasone (FLONASE) 50 MCG/ACT nasal spray Place 2 sprays into both nostrils daily.    . furosemide (LASIX) 40 MG tablet Take 1 tablet (40 mg total) by mouth 2 (two) times daily. 60 tablet 11  . JALYN 0.5-0.4 MG CAPS TAKE 1 CAPSULE BY MOUTH DAILY 90 capsule 3  . KLOR-CON M10 10 MEQ tablet Take 1 tablet by mouth daily.    Marland Kitchen lisinopril (PRINIVIL,ZESTRIL) 40 MG tablet TAKE 1 TABLET BY MOUTH EVERY DAY 30 tablet  6  . omeprazole (PRILOSEC) 20 MG capsule TAKE ONE CAPSULE BY MOUTH EVERY DAY 30 capsule 5  . potassium chloride (K-DUR) 10 MEQ tablet Take 1 tablet (10 mEq total) by mouth daily. 30 tablet 11  . sertraline (ZOLOFT) 100 MG tablet TAKE 1 AND 1/2 TABLETS DAILY 45 tablet 3  . Vitamin D, Ergocalciferol, (DRISDOL) 50000 UNITS CAPS capsule Take 1 capsule (50,000 Units total) by mouth every 7 (seven) days. 30 capsule 0   No current facility-administered medications for this visit.     Allergies:   Review of patient's allergies indicates no known allergies.    Social History:  The patient  reports that he has quit smoking. He has never used smokeless tobacco. He reports that he drinks alcohol. He reports that he does not use illicit drugs.   Family History:  The patient's family history includes Healthy in his son; Heart disease in his brother; Heart failure in his son; Lung cancer in his brother; Pancreatic cancer in his son; Pneumonia (age of onset: 70) in his father.    ROS:  Please see the history of present illness.    Otherwise, review of systems positive for none.   All other systems are reviewed and negative.    PHYSICAL EXAM: VS:  BP 134/80 mmHg  Pulse 60  Ht 5\' 10"  (1.778 m)  Wt 169 lb (76.658 kg)  BMI 24.25 kg/m2 , BMI Body mass index is 24.25 kg/(m^2).  General: Alert, oriented x3, no distress Head: no evidence of trauma, PERRL, EOMI, no exophtalmos or lid lag, no myxedema, no xanthelasma; normal ears, nose and oropharynx Neck: normal jugular venous pulsations and no hepatojugular reflux; brisk carotid pulses without delay and no carotid bruits Chest: clear to auscultation, no signs of consolidation by percussion or palpation, normal fremitus, symmetrical and full respiratory excursions Cardiovascular: normal position and quality of the apical impulse, regular rhythm, normal first and  widely split second heart sounds, no murmurs, rubs or gallops Abdomen: no tenderness or distention, no masses by palpation, no abnormal pulsatility or arterial bruits, normal bowel sounds, no hepatosplenomegaly Extremities: no clubbing, cyanosis or edema; 2+ radial, ulnar and brachial pulses bilaterally; 2+ right femoral, posterior tibial and dorsalis pedis pulses; 2+ left femoral, posterior tibial and dorsalis pedis pulses; no subclavian or femoral bruits Neurological: grossly nonfocal Psych: euthymic mood, full affect   EKG:  EKG is not ordered today.   Recent  Labs: 03/04/2015: TSH 0.71 03/14/2015: ALT 15; B Natriuretic Peptide 813.5*; Hemoglobin 13.8; Platelets 147* 05/27/2015: BUN 20; Creatinine, Ser 1.18; Potassium 4.2; Sodium 136    Lipid Panel    Component Value Date/Time   CHOL 165 09/25/2012 1207   TRIG 105.0 09/25/2012 1207   HDL 50.10 09/25/2012 1207   CHOLHDL 3 09/25/2012 1207   VLDL 21.0 09/25/2012 1207   LDLCALC 94 09/25/2012 1207      Wt Readings from Last 3 Encounters:  07/28/15 169 lb (76.658 kg)  05/27/15 179 lb (81.194 kg)  05/18/15 181 lb 14.4 oz (82.509 kg)      ASSESSMENT AND PLAN:  CHF, combined chronic He needs a scheduled dose of diuretic. Today he appears euvolemic and he feels well. Functional class II. Reviewed the importance of continued daily weight monitoring, sodium restriction and discussed the signs of symptoms of heart failure exacerbation.  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 Note history of mild CAD by cath 2006 with "false positive" (?) inferior wall scar and EF 45%. His nuclear  study did not show large reversible defects. Maximum ACEi dose. Beta blocker dose limited by conduction abnormalities. He does not have angina pectoris. Other antianginals do not appear to be justified.  Paroxysmal atrial fibrillation  Tolerating Eliquis without bleeding so far. Previous MRI demonstrated a stroke, but he does not have any neurological deficits  Trifascicular block  Caution for evidence of AV block with carvedilol.    Hypertension satisfactory control on current medications     Current medicines are reviewed at length with the patient today.  The patient does not have concerns regarding medicines.  The following changes have been made:  no change  Labs/ tests ordered today include:  No orders of the defined types were placed in this encounter.     Patient Instructions  Your physician wants you to follow-up in: 6 Months You will receive a reminder  letter in the mail two months in advance. If you don't receive a letter, please call our office to schedule the follow-up appointment.  Keep daily weight and call office if weight go over 172lbs       Mikael Spray, MD  07/30/2015 5:41 PM    Sanda Klein, MD, [facility] [phone] office [phone] pager

## 2015-07-30 ENCOUNTER — Encounter: Payer: Self-pay | Admitting: Cardiovascular Disease

## 2015-08-04 DIAGNOSIS — M5441 Lumbago with sciatica, right side: Secondary | ICD-10-CM | POA: Diagnosis not present

## 2015-08-04 DIAGNOSIS — M5136 Other intervertebral disc degeneration, lumbar region: Secondary | ICD-10-CM | POA: Diagnosis not present

## 2015-08-04 DIAGNOSIS — G894 Chronic pain syndrome: Secondary | ICD-10-CM | POA: Diagnosis not present

## 2015-08-12 ENCOUNTER — Telehealth: Payer: Self-pay | Admitting: Family Medicine

## 2015-08-12 NOTE — Telephone Encounter (Signed)
Okay to hold Eliquis for 3 days prior to injection

## 2015-08-12 NOTE — Telephone Encounter (Signed)
Faxed form to Amanda

## 2015-08-12 NOTE — Telephone Encounter (Signed)
Pt is sch for a lumbar inj on 09/07/15 and Ortho needs to the ok to stop his Eliquis for 3 days prior to procedure. Fax the ok to [phone] Fredia Beets.

## 2015-08-14 ENCOUNTER — Other Ambulatory Visit: Payer: Self-pay | Admitting: Family Medicine

## 2015-09-07 DIAGNOSIS — M5136 Other intervertebral disc degeneration, lumbar region: Secondary | ICD-10-CM | POA: Diagnosis not present

## 2015-09-09 ENCOUNTER — Ambulatory Visit (INDEPENDENT_AMBULATORY_CARE_PROVIDER_SITE_OTHER): Payer: Commercial Managed Care - HMO | Admitting: Family Medicine

## 2015-09-09 ENCOUNTER — Other Ambulatory Visit: Payer: Self-pay | Admitting: Family Medicine

## 2015-09-09 ENCOUNTER — Other Ambulatory Visit: Payer: Self-pay | Admitting: Cardiovascular Disease

## 2015-09-09 ENCOUNTER — Encounter: Payer: Self-pay | Admitting: Family Medicine

## 2015-09-09 VITALS — BP 140/84 | HR 70 | Temp 98.1°F | Ht 70.0 in | Wt 170.5 lb

## 2015-09-09 DIAGNOSIS — R42 Dizziness and giddiness: Secondary | ICD-10-CM | POA: Diagnosis not present

## 2015-09-09 DIAGNOSIS — G2581 Restless legs syndrome: Secondary | ICD-10-CM | POA: Insufficient documentation

## 2015-09-09 MED ORDER — PRAMIPEXOLE DIHYDROCHLORIDE 0.125 MG PO TABS: 0.1250 mg | ORAL_TABLET | Freq: Every day | ORAL | Status: DC

## 2015-09-09 NOTE — Progress Notes (Signed)
Pre visit review using our clinic review tool, if applicable. No additional management support is needed unless otherwise documented below in the visit note. 

## 2015-09-09 NOTE — Patient Instructions (Signed)
Restless Legs Syndrome Restless legs syndrome is a movement disorder. It may also be called a sensorimotor disorder.  CAUSES  No one knows what specifically causes restless legs syndrome, but it tends to run in families. It is also more common in people with low iron, in pregnancy, in people who need dialysis, and those with nerve damage (neuropathy).Some medications may make restless legs syndrome worse.Those medications include drugs to treat high blood pressure, some heart conditions, nausea, colds, allergies, and depression. SYMPTOMS Symptoms include uncomfortable sensations in the legs. These leg sensations are worse during periods of inactivity or rest. They are also worse while sitting or lying down. Individuals that have the disorder describe sensations in the legs that feel like:  Pulling.  Drawing.  Crawling.  Worming.  Boring.  Tingling.  Pins and needles.  Prickling.  Pain. The sensations are usually accompanied by an overwhelming urge to move the legs. Sudden muscle jerks may also occur. Movement provides temporary relief from the discomfort. In rare cases, the arms may also be affected. Symptoms may interfere with going to sleep (sleep onset insomnia). Restless legs syndrome may also be related to periodic limb movement disorder (PLMD). PLMD is another more common motor disorder. It also causes interrupted sleep. The symptoms from PLMD usually occur most often when you are awake. TREATMENT  Treatment for restless legs syndrome is symptomatic. This means that the symptoms are treated.   Massage and cold compresses may provide temporary relief.  Walk, stretch, or take a cold or hot bath.  Get regular exercise and a good night's sleep.  Avoid caffeine, alcohol, nicotine, and medications that can make it worse.  Do activities that provide mental stimulation like discussions, needlework, and video games. These may be helpful if you are not able to walk or stretch. Some  medications are effective in relieving the symptoms. However, many of these medications have side effects. Ask your caregiver about medications that may help your symptoms. Correcting iron deficiency may improve symptoms for some patients. Document Released: 12/01/2002 Document Revised: 04/27/2014 Document Reviewed: 03/09/2011 ExitCare Patient Information 2015 [employer]. This information is not intended to replace advice given to you by your health care provider. Make sure you discuss any questions you have with your health care provider.  

## 2015-09-09 NOTE — Progress Notes (Signed)
   Subjective:    Patient ID: Micheal Nicholson, male    DOB: [birth year], 81 y.o.   MRN: [id number]  HPI Patient has multiple chronic problems including history of BPH, chronic combined systolic and diastolic heart failure, chronic anxiety, hypertension, atrial fibrillation, history of cerebrovascular disease. Seen today for the following  Recent episodes of restless leg symptoms especially right lower extremity but sometimes left. He has sensation of jumping or twitching of the leg and this is relieved with getting up and walking and movement. Generally has about 3-4 nights per week. No caffeine use. No regular alcohol use. No anemia by recent labs. This is starting to disrupt his sleep more  Vertigo. Patient relates very transient vertigo when he gets up suddenly from position change. Cannot distinguish symptoms with any one particular direction. No recent speech changes or swallowing difficulties. No ataxia. No focal weakness. Symptoms are usually very transient and last only a few seconds. No syncopal or presyncopal symptoms.  Past Medical History  Diagnosis Date  . Hypertension   . Anxiety   . BPH (benign prostatic hyperplasia)   . Chronic combined systolic and diastolic CHF, NYHA class 2 04/17/2015   Past Surgical History  Procedure Laterality Date  . Cardiac catheterization      Negative / 4 years ago    reports that he has quit smoking. He has never used smokeless tobacco. He reports that he drinks alcohol. He reports that he does not use illicit drugs. family history includes Healthy in his son; Heart disease in his brother; Heart failure in his son; Lung cancer in his brother; Pancreatic cancer in his son; Pneumonia (age of onset: 29) in his father. No Known Allergies    Review of Systems  Constitutional: Negative for fatigue and unexpected weight change.  Eyes: Negative for visual disturbance.  Respiratory: Negative for cough, chest tightness and shortness of breath.     Cardiovascular: Negative for chest pain, palpitations and leg swelling.  Gastrointestinal: Negative for abdominal pain.  Genitourinary: Negative for dysuria.  Neurological: Positive for dizziness. Negative for seizures, syncope, weakness, light-headedness and headaches.  Psychiatric/Behavioral: Negative for confusion.       Objective:   Physical Exam  Constitutional: He is oriented to person, place, and time. He appears well-developed and well-nourished. No distress.  Neck: Neck supple. No thyromegaly present.  Cardiovascular: Normal rate and regular rhythm.   Pulmonary/Chest: Effort normal and breath sounds normal. No respiratory distress. He has no wheezes. He has no rales.  Musculoskeletal: He exhibits no edema.  Normal distal foot pulses  Neurological: He is alert and oriented to person, place, and time. No cranial nerve deficit. Coordination normal.  Normal cerebellar function by finger nose testing and gait. Patient able to transfer from chair to table without any assistance without difficulty. No focal strength deficits          Assessment & Plan:  #1 restless leg syndrome very likely by history. Starting to disrupt sleep more. Avoid caffeine at night. Trial of low-dose Mirapex 0.125 mg once at night and reassess 3 weeks #2 vertigo. Symptoms very transient. No worrisome red flag symptoms. Change positions very slowly. Consider vestibular rehabilitation if symptoms persist or worsen

## 2015-09-15 DIAGNOSIS — D1801 Hemangioma of skin and subcutaneous tissue: Secondary | ICD-10-CM | POA: Diagnosis not present

## 2015-09-15 DIAGNOSIS — D229 Melanocytic nevi, unspecified: Secondary | ICD-10-CM | POA: Diagnosis not present

## 2015-09-15 DIAGNOSIS — Z85828 Personal history of other malignant neoplasm of skin: Secondary | ICD-10-CM | POA: Diagnosis not present

## 2015-09-15 DIAGNOSIS — L821 Other seborrheic keratosis: Secondary | ICD-10-CM | POA: Diagnosis not present

## 2015-09-23 ENCOUNTER — Other Ambulatory Visit: Payer: Self-pay | Admitting: Family Medicine

## 2015-09-24 NOTE — Telephone Encounter (Signed)
Spoke with Dr. Sarajane Jews. He approved #[phone] with zero refills.

## 2015-09-24 NOTE — Telephone Encounter (Signed)
Rx sent in

## 2015-09-30 ENCOUNTER — Encounter: Payer: Self-pay | Admitting: Family Medicine

## 2015-09-30 ENCOUNTER — Ambulatory Visit (INDEPENDENT_AMBULATORY_CARE_PROVIDER_SITE_OTHER): Payer: Commercial Managed Care - HMO | Admitting: Family Medicine

## 2015-09-30 VITALS — BP 110/70 | HR 77 | Temp 97.4°F | Ht 70.0 in | Wt 170.9 lb

## 2015-09-30 DIAGNOSIS — G2581 Restless legs syndrome: Secondary | ICD-10-CM | POA: Diagnosis not present

## 2015-09-30 DIAGNOSIS — Z23 Encounter for immunization: Secondary | ICD-10-CM

## 2015-09-30 NOTE — Progress Notes (Signed)
   Subjective:    Patient ID: Micheal Nicholson, male    DOB: [birth year], 81 y.o.   MRN: [id number]  HPI Patient seen for follow-up regarding restless leg syndrome. Refer to prior note. Having predominantly symptoms involving the right lower extremity. Started very low-dose Mirapex 0.125 mg and had almost total resolution of his symptoms. He is sleeping much better. He's had no recurrent vertigo symptoms. He has osteoarthritis right hip and had recent injection right hip without improvement. He plans to follow-up with orthopedist regarding that. He is avoiding late day use of caffeine and drinks no alcohol at night.  Past Medical History  Diagnosis Date  . Hypertension   . Anxiety   . BPH (benign prostatic hyperplasia)   . Chronic combined systolic and diastolic CHF, NYHA class 2 ([place]) 04/17/2015   Past Surgical History  Procedure Laterality Date  . Cardiac catheterization      Negative / 4 years ago    reports that he has quit smoking. He has never used smokeless tobacco. He reports that he drinks alcohol. He reports that he does not use illicit drugs. family history includes Healthy in his son; Heart disease in his brother; Heart failure in his son; Lung cancer in his brother; Pancreatic cancer in his son; Pneumonia (age of onset: 19) in his father. No Known Allergies    Review of Systems  Constitutional: Negative for fatigue.  Eyes: Negative for visual disturbance.  Respiratory: Negative for cough, chest tightness and shortness of breath.   Cardiovascular: Negative for chest pain, palpitations and leg swelling.  Neurological: Negative for dizziness, syncope, weakness, light-headedness and headaches.       Objective:   Physical Exam  Constitutional: He is oriented to person, place, and time. He appears well-developed and well-nourished.  Neck: Neck supple. No thyromegaly present.  Cardiovascular: Normal rate and regular rhythm.   Pulmonary/Chest: Effort normal and breath  sounds normal. No respiratory distress. He has no wheezes. He has no rales.  Musculoskeletal: He exhibits no edema.  Neurological: He is alert and oriented to person, place, and time. No cranial nerve deficit.          Assessment & Plan:  Restless leg syndrome. Improved with very low-dose Mirapex. Continue 0.125 mg daily at bedtime. Patient already had flu vaccine. Prevnar 13 given.

## 2015-09-30 NOTE — Progress Notes (Signed)
Pre visit review using our clinic review tool, if applicable. No additional management support is needed unless otherwise documented below in the visit note. 

## 2015-10-11 DIAGNOSIS — G894 Chronic pain syndrome: Secondary | ICD-10-CM | POA: Diagnosis not present

## 2015-10-11 DIAGNOSIS — G8929 Other chronic pain: Secondary | ICD-10-CM | POA: Diagnosis not present

## 2015-10-11 DIAGNOSIS — M5136 Other intervertebral disc degeneration, lumbar region: Secondary | ICD-10-CM | POA: Diagnosis not present

## 2015-10-11 DIAGNOSIS — M5441 Lumbago with sciatica, right side: Secondary | ICD-10-CM | POA: Diagnosis not present

## 2015-10-20 ENCOUNTER — Other Ambulatory Visit: Payer: Self-pay | Admitting: Family Medicine

## 2015-10-20 NOTE — Telephone Encounter (Signed)
Pt last visit 09/30/15 and last Rx refill 07/01/15 #90 with 2 refills

## 2015-10-20 NOTE — Telephone Encounter (Signed)
Refill with 2 additional refills. 

## 2015-10-25 ENCOUNTER — Other Ambulatory Visit: Payer: Self-pay | Admitting: Family Medicine

## 2015-12-01 DIAGNOSIS — M5136 Other intervertebral disc degeneration, lumbar region: Secondary | ICD-10-CM | POA: Diagnosis not present

## 2015-12-23 ENCOUNTER — Ambulatory Visit (INDEPENDENT_AMBULATORY_CARE_PROVIDER_SITE_OTHER): Payer: Commercial Managed Care - HMO | Admitting: Family Medicine

## 2015-12-23 ENCOUNTER — Encounter: Payer: Self-pay | Admitting: Family Medicine

## 2015-12-23 VITALS — BP 112/74 | HR 58 | Temp 97.8°F | Resp 20 | Wt 172.2 lb

## 2015-12-23 DIAGNOSIS — J069 Acute upper respiratory infection, unspecified: Secondary | ICD-10-CM | POA: Diagnosis not present

## 2015-12-23 DIAGNOSIS — J029 Acute pharyngitis, unspecified: Secondary | ICD-10-CM

## 2015-12-23 MED ORDER — GUAIFENESIN ER 600 MG PO TB12: 600.0000 mg | ORAL_TABLET | Freq: Two times a day (BID) | ORAL | Status: DC

## 2015-12-23 NOTE — Patient Instructions (Signed)
I have called in mucinex for you. Take this twice a day for the next couple of days to help dry secretions.  Restart flonase and claritin.  Hydrated, rest. Follow up with PCP in 1 week if worsening or no improvement.

## 2015-12-23 NOTE — Progress Notes (Signed)
   Subjective:    Patient ID: Micheal Nicholson, male    DOB: [birth year], 81 y.o.   MRN: [id number]  HPI Sore throat :  Patient presents for an acute office visit with a 2-3 day history of sinus pressure. He states he always has problem with his sinuses that are more allergy related. He denies fever, chills , headache, nausea, vomit , diarrhea or rash. He reports he is eating and drinking well. He endorses some postnasal drainage , and a mild sort for which she's been trying to use Chloraseptic spray. He states it helps for a little bit but it's only temporary relief. He has not had any sick contacts that he is wear of. He intermittently takes allergy medications of Claritin and Flonase.  He denies headaches or myalgias. He is up-to-date on his flu vaccination.  He is a former smoker.  Past Medical History  Diagnosis Date  . Hypertension   . Anxiety   . BPH (benign prostatic hyperplasia)   . Chronic combined systolic and diastolic CHF, NYHA class 2 ([facility]) 04/17/2015   No Known Allergies   Review of Systems Negative, with the exception of above mentioned in HPI     Objective:   Physical Exam BP 112/74 mmHg  Pulse 58  Temp(Src) 97.8 F (36.6 C)  Resp 20  Wt 172 lb 4 oz (78.132 kg)  SpO2 97% Gen: Afebrile. No acute distress.  No acute distress, nontoxic in appearance, well-developed, well-nourished, elderly Caucasian gentleman. Very pleasant. HENT: AT. [place]. Bilateral TM visualized and normal in appearance. MMM , no oral lesions. Bilateral nares  With erythema, drainage in mild swelling. Throat without erythema or exudates.  No cough on exam, mild hoarseness on exam. No tenderness to palpation a sinuses. Eyes:Pupils Equal Round Reactive to light, Extraocular movements intact,  Conjunctiva without redness, discharge or icterus. Neck/lymp/endocrine: Supple, no lymphadenopathy CV: RRR Chest: CTAB, no wheeze or crackles. Normal respiratory effort, good air movement. Abd: Soft. NTND. BS   present.  Skin:  no rashes, purpura or petechiae.  Neuro:  unchanged gait. PERLA. EOMi. Alert. Oriented x3    Assessment & Plan:  1. Sore throat - POCT rapid strep A--. Negative , no exudates did not appear to be a strep pharyngitis.  2. Acute upper respiratory infection -  Patient to treat symptoms symptomatically.  He is to restart his Claritin and Flonase daily. Encouraged him to use a humidifier at night. He can continue Chloraseptic for relief. -  Mucinex encouraged, and prescribed for him today. - guaiFENesin (MUCINEX) 600 MG 12 hr tablet; Take 1 tablet (600 mg total) by mouth 2 (two) times daily.  Dispense: 30 tablet; Refill: 0 -  Patient was encouraged if he is not feeling improved by next week, her symptoms are worsening every week and he needs to be seen.   follow-up one week

## 2015-12-29 ENCOUNTER — Inpatient Hospital Stay (HOSPITAL_COMMUNITY)
Admission: EM | Admit: 2015-12-29 | Discharge: 2016-01-03 | DRG: 315 | Disposition: A | Discharge status: Home Health | Payer: Commercial Managed Care - HMO | Attending: Internal Medicine | Admitting: Internal Medicine

## 2015-12-29 ENCOUNTER — Ambulatory Visit (INDEPENDENT_AMBULATORY_CARE_PROVIDER_SITE_OTHER): Payer: Commercial Managed Care - HMO | Admitting: Family Medicine

## 2015-12-29 ENCOUNTER — Emergency Department (HOSPITAL_COMMUNITY): Payer: Commercial Managed Care - HMO

## 2015-12-29 ENCOUNTER — Encounter: Payer: Self-pay | Admitting: Family Medicine

## 2015-12-29 ENCOUNTER — Encounter (HOSPITAL_COMMUNITY): Payer: Self-pay | Admitting: Emergency Medicine

## 2015-12-29 VITALS — BP 80/47 | HR 37 | Temp 98.0°F | Resp 20 | Wt 171.2 lb

## 2015-12-29 DIAGNOSIS — N178 Other acute kidney failure: Secondary | ICD-10-CM | POA: Diagnosis not present

## 2015-12-29 DIAGNOSIS — E860 Dehydration: Secondary | ICD-10-CM | POA: Diagnosis present

## 2015-12-29 DIAGNOSIS — I110 Hypertensive heart disease with heart failure: Secondary | ICD-10-CM | POA: Diagnosis present

## 2015-12-29 DIAGNOSIS — I959 Hypotension, unspecified: Secondary | ICD-10-CM | POA: Diagnosis present

## 2015-12-29 DIAGNOSIS — R627 Adult failure to thrive: Secondary | ICD-10-CM | POA: Diagnosis not present

## 2015-12-29 DIAGNOSIS — I4891 Unspecified atrial fibrillation: Secondary | ICD-10-CM | POA: Insufficient documentation

## 2015-12-29 DIAGNOSIS — E871 Hypo-osmolality and hyponatremia: Secondary | ICD-10-CM | POA: Diagnosis present

## 2015-12-29 DIAGNOSIS — Z87891 Personal history of nicotine dependence: Secondary | ICD-10-CM | POA: Diagnosis not present

## 2015-12-29 DIAGNOSIS — K219 Gastro-esophageal reflux disease without esophagitis: Secondary | ICD-10-CM | POA: Diagnosis present

## 2015-12-29 DIAGNOSIS — N179 Acute kidney failure, unspecified: Secondary | ICD-10-CM | POA: Diagnosis not present

## 2015-12-29 DIAGNOSIS — Z801 Family history of malignant neoplasm of trachea, bronchus and lung: Secondary | ICD-10-CM

## 2015-12-29 DIAGNOSIS — I2510 Atherosclerotic heart disease of native coronary artery without angina pectoris: Secondary | ICD-10-CM | POA: Diagnosis present

## 2015-12-29 DIAGNOSIS — R5383 Other fatigue: Secondary | ICD-10-CM | POA: Diagnosis not present

## 2015-12-29 DIAGNOSIS — I453 Trifascicular block: Secondary | ICD-10-CM | POA: Diagnosis present

## 2015-12-29 DIAGNOSIS — F411 Generalized anxiety disorder: Secondary | ICD-10-CM | POA: Diagnosis present

## 2015-12-29 DIAGNOSIS — I5042 Chronic combined systolic (congestive) and diastolic (congestive) heart failure: Secondary | ICD-10-CM | POA: Diagnosis not present

## 2015-12-29 DIAGNOSIS — I248 Other forms of acute ischemic heart disease: Secondary | ICD-10-CM | POA: Diagnosis present

## 2015-12-29 DIAGNOSIS — Z91018 Allergy to other foods: Secondary | ICD-10-CM | POA: Diagnosis not present

## 2015-12-29 DIAGNOSIS — I452 Bifascicular block: Secondary | ICD-10-CM | POA: Diagnosis present

## 2015-12-29 DIAGNOSIS — Z800 Family history of malignant neoplasm of digestive organs: Secondary | ICD-10-CM

## 2015-12-29 DIAGNOSIS — N400 Enlarged prostate without lower urinary tract symptoms: Secondary | ICD-10-CM | POA: Diagnosis present

## 2015-12-29 DIAGNOSIS — I9589 Other hypotension: Secondary | ICD-10-CM | POA: Diagnosis not present

## 2015-12-29 DIAGNOSIS — I480 Paroxysmal atrial fibrillation: Secondary | ICD-10-CM | POA: Diagnosis present

## 2015-12-29 DIAGNOSIS — E861 Hypovolemia: Secondary | ICD-10-CM | POA: Diagnosis present

## 2015-12-29 DIAGNOSIS — R7989 Other specified abnormal findings of blood chemistry: Secondary | ICD-10-CM | POA: Diagnosis not present

## 2015-12-29 DIAGNOSIS — Z7902 Long term (current) use of antithrombotics/antiplatelets: Secondary | ICD-10-CM

## 2015-12-29 DIAGNOSIS — I951 Orthostatic hypotension: Secondary | ICD-10-CM

## 2015-12-29 DIAGNOSIS — R001 Bradycardia, unspecified: Secondary | ICD-10-CM | POA: Diagnosis present

## 2015-12-29 DIAGNOSIS — N19 Unspecified kidney failure: Secondary | ICD-10-CM

## 2015-12-29 DIAGNOSIS — Z8249 Family history of ischemic heart disease and other diseases of the circulatory system: Secondary | ICD-10-CM | POA: Diagnosis not present

## 2015-12-29 DIAGNOSIS — Z8673 Personal history of transient ischemic attack (TIA), and cerebral infarction without residual deficits: Secondary | ICD-10-CM | POA: Diagnosis not present

## 2015-12-29 DIAGNOSIS — I4819 Other persistent atrial fibrillation: Secondary | ICD-10-CM | POA: Insufficient documentation

## 2015-12-29 DIAGNOSIS — I420 Dilated cardiomyopathy: Secondary | ICD-10-CM | POA: Diagnosis not present

## 2015-12-29 DIAGNOSIS — I482 Chronic atrial fibrillation: Secondary | ICD-10-CM | POA: Diagnosis present

## 2015-12-29 DIAGNOSIS — R05 Cough: Secondary | ICD-10-CM | POA: Diagnosis not present

## 2015-12-29 DIAGNOSIS — R778 Other specified abnormalities of plasma proteins: Secondary | ICD-10-CM | POA: Diagnosis present

## 2015-12-29 HISTORY — DX: Unspecified osteoarthritis, unspecified site: M19.90

## 2015-12-29 HISTORY — DX: Gastro-esophageal reflux disease without esophagitis: K21.9

## 2015-12-29 HISTORY — DX: Heart failure, unspecified: I50.9

## 2015-12-29 HISTORY — DX: Bradycardia, unspecified: R00.1

## 2015-12-29 LAB — COMPREHENSIVE METABOLIC PANEL
ALT: 21 U/L (ref 17–63)
AST: 29 U/L (ref 15–41)
Albumin: 3.7 g/dL (ref 3.5–5.0)
Alkaline Phosphatase: 55 U/L (ref 38–126)
Anion gap: 12 (ref 5–15)
BILIRUBIN TOTAL: 1.6 mg/dL — AB (ref 0.3–1.2)
BUN: 64 mg/dL — AB (ref 6–20)
CHLORIDE: 94 mmol/L — AB (ref 101–111)
CO2: 25 mmol/L (ref 22–32)
CREATININE: 2.33 mg/dL — AB (ref 0.61–1.24)
Calcium: 8.8 mg/dL — ABNORMAL LOW (ref 8.9–10.3)
GFR, EST AFRICAN AMERICAN: 26 mL/min — AB (ref >60)
GFR, EST NON AFRICAN AMERICAN: 23 mL/min — AB (ref >60)
Glucose, Bld: 107 mg/dL — ABNORMAL HIGH (ref 65–99)
POTASSIUM: 3.9 mmol/L (ref 3.5–5.1)
SODIUM: 131 mmol/L — AB (ref 135–145)
TOTAL PROTEIN: 6.7 g/dL (ref 6.5–8.1)

## 2015-12-29 LAB — CBC WITH DIFFERENTIAL/PLATELET
Basophils Absolute: 0 10*3/uL (ref 0.0–0.1)
Basophils Relative: 0 %
EOS ABS: 0.1 10*3/uL (ref 0.0–0.7)
EOS PCT: 2 %
HCT: 35.4 % — ABNORMAL LOW (ref 39.0–52.0)
Hemoglobin: 12.3 g/dL — ABNORMAL LOW (ref 13.0–17.0)
LYMPHS ABS: 1.1 10*3/uL (ref 0.7–4.0)
Lymphocytes Relative: 23 %
MCH: 30.4 pg (ref 26.0–34.0)
MCHC: 34.7 g/dL (ref 30.0–36.0)
MCV: 87.6 fL (ref 78.0–100.0)
Monocytes Absolute: 0.4 10*3/uL (ref 0.1–1.0)
Monocytes Relative: 7 %
Neutro Abs: 3.3 10*3/uL (ref 1.7–7.7)
Neutrophils Relative %: 68 %
PLATELETS: 150 10*3/uL (ref 150–400)
RBC: 4.04 MIL/uL — AB (ref 4.22–5.81)
RDW: 12.9 % (ref 11.5–15.5)
WBC: 4.9 10*3/uL (ref 4.0–10.5)

## 2015-12-29 LAB — MAGNESIUM
MAGNESIUM: 2.5 mg/dL — AB (ref 1.7–2.4)
MAGNESIUM: 2.5 mg/dL — AB (ref 1.7–2.4)

## 2015-12-29 LAB — I-STAT CHEM 8, ED
BUN: 58 mg/dL — ABNORMAL HIGH (ref 6–20)
CREATININE: 2.2 mg/dL — AB (ref 0.61–1.24)
Calcium, Ion: 1.06 mmol/L — ABNORMAL LOW (ref 1.13–1.30)
Chloride: 93 mmol/L — ABNORMAL LOW (ref 101–111)
GLUCOSE: 100 mg/dL — AB (ref 65–99)
HCT: 39 % (ref 39.0–52.0)
HEMOGLOBIN: 13.3 g/dL (ref 13.0–17.0)
POTASSIUM: 3.8 mmol/L (ref 3.5–5.1)
Sodium: 130 mmol/L — ABNORMAL LOW (ref 135–145)
TCO2: 23 mmol/L (ref 0–100)

## 2015-12-29 LAB — TROPONIN I
TROPONIN I: 0.07 ng/mL — AB (ref <0.031)
Troponin I: 0.06 ng/mL — ABNORMAL HIGH (ref <0.031)
Troponin I: 0.06 ng/mL — ABNORMAL HIGH (ref <0.031)

## 2015-12-29 LAB — I-STAT CG4 LACTIC ACID, ED: LACTIC ACID, VENOUS: 0.9 mmol/L (ref 0.5–2.0)

## 2015-12-29 LAB — TSH: TSH: 0.542 u[IU]/mL (ref 0.350–4.500)

## 2015-12-29 LAB — PHOSPHORUS: Phosphorus: 4.8 mg/dL — ABNORMAL HIGH (ref 2.5–4.6)

## 2015-12-29 MED ORDER — APIXABAN 2.5 MG PO TABS
2.5000 mg | ORAL_TABLET | Freq: Two times a day (BID) | ORAL | Status: DC
  Administered 2015-12-29 – 2016-01-01 (×6): 2.5 mg via ORAL
  Filled 2015-12-29 (×6): qty 1

## 2015-12-29 MED ORDER — ALPRAZOLAM 0.5 MG PO TABS
0.5000 mg | ORAL_TABLET | Freq: Two times a day (BID) | ORAL | Status: DC | PRN
  Administered 2015-12-29 – 2016-01-02 (×8): 0.5 mg via ORAL
  Filled 2015-12-29 (×8): qty 1

## 2015-12-29 MED ORDER — SODIUM CHLORIDE 0.9 % IV SOLN: INTRAVENOUS | Status: AC

## 2015-12-29 MED ORDER — DUTASTERIDE-TAMSULOSIN HCL 0.5-0.4 MG PO CAPS: 1.0000 | ORAL_CAPSULE | Freq: Every day | ORAL | Status: DC

## 2015-12-29 MED ORDER — ONDANSETRON HCL 4 MG/2ML IJ SOLN: 4.0000 mg | Freq: Four times a day (QID) | INTRAMUSCULAR | Status: DC | PRN

## 2015-12-29 MED ORDER — PANTOPRAZOLE SODIUM 40 MG PO TBEC
40.0000 mg | DELAYED_RELEASE_TABLET | Freq: Every day | ORAL | Status: DC
  Administered 2015-12-30 – 2016-01-03 (×5): 40 mg via ORAL
  Filled 2015-12-29 (×5): qty 1

## 2015-12-29 MED ORDER — TAMSULOSIN HCL 0.4 MG PO CAPS
0.4000 mg | ORAL_CAPSULE | Freq: Every day | ORAL | Status: DC
  Administered 2015-12-30 – 2016-01-03 (×5): 0.4 mg via ORAL
  Filled 2015-12-29 (×5): qty 1

## 2015-12-29 MED ORDER — SODIUM CHLORIDE 0.9 % IV BOLUS (SEPSIS)
1000.0000 mL | Freq: Once | INTRAVENOUS | Status: AC
  Administered 2015-12-29: 1000 mL via INTRAVENOUS

## 2015-12-29 MED ORDER — CALCIUM GLUCONATE 10 % IV SOLN
1.0000 g | Freq: Once | INTRAVENOUS | Status: AC
  Administered 2015-12-29: 1 g via INTRAVENOUS
  Filled 2015-12-29: qty 10

## 2015-12-29 MED ORDER — APIXABAN 2.5 MG PO TABS: 2.5000 mg | ORAL_TABLET | Freq: Two times a day (BID) | ORAL | Status: DC

## 2015-12-29 MED ORDER — ONDANSETRON HCL 4 MG PO TABS: 4.0000 mg | ORAL_TABLET | Freq: Four times a day (QID) | ORAL | Status: DC | PRN

## 2015-12-29 MED ORDER — ACETAMINOPHEN 325 MG PO TABS
650.0000 mg | ORAL_TABLET | Freq: Once | ORAL | Status: AC
  Administered 2015-12-29: 650 mg via ORAL
  Filled 2015-12-29: qty 2

## 2015-12-29 MED ORDER — ALPRAZOLAM 0.5 MG PO TABS
1.0000 mg | ORAL_TABLET | Freq: Once | ORAL | Status: DC
  Filled 2015-12-29: qty 2

## 2015-12-29 MED ORDER — SODIUM BICARBONATE 8.4 % IV SOLN
50.0000 meq | Freq: Once | INTRAVENOUS | Status: AC
  Administered 2015-12-29: 50 meq via INTRAVENOUS
  Filled 2015-12-29: qty 50

## 2015-12-29 MED ORDER — DUTASTERIDE 0.5 MG PO CAPS
0.5000 mg | ORAL_CAPSULE | Freq: Every day | ORAL | Status: DC
  Administered 2015-12-30 – 2016-01-03 (×5): 0.5 mg via ORAL
  Filled 2015-12-29 (×5): qty 1

## 2015-12-29 NOTE — Patient Instructions (Signed)
I want you to got to [facility]. You need IV fluids, labs and xray of your chest.

## 2015-12-29 NOTE — Progress Notes (Signed)
   Subjective:    Patient ID: Micheal Nicholson, male    DOB: 1920-02-03, 82 y.o.   MRN: [id number]  HPI   Fatigue: Pt presents for follow up on URI symptoms from last week. Pt states he is feeling more fatigued. He can not stand up on his own. He has an EF ~30%, h/o stroke, AF, HTN and trifascicular block. Pt states he has been taking his Cardiac meds as indicated, including all of his morning doses. He currently is prescribed Lasix 40 mg BID, lisinopril 40 mg QD and coreg 3.125 BID. Pt has not been eating or drinking well over the last 3 days. He denies fever, chills, cough, nausea, vomit, abdominal pain, diarrhea or changes in urine. He endorses severe fatigue. His BP is 80/47 and HR 37-->44 today.   Past Medical History  Diagnosis Date  . Hypertension   . Anxiety   . BPH (benign prostatic hyperplasia)   . Chronic combined systolic and diastolic CHF, NYHA class 2 ([place]) 04/17/2015   No Known Allergies  Past Surgical History  Procedure Laterality Date  . Cardiac catheterization      Negative / 4 years ago   Social History  Substance Use Topics  . Smoking status: Former Smoker -- 0.50 packs/day for 5 years  . Smokeless tobacco: Never Used  . Alcohol Use: Yes     Comment: occ     Review of Systems Negative, with the exception of above mentioned in HPI     Objective:   Physical Exam BP 80/47 mmHg  Pulse 37  Temp(Src) 98 F (36.7 C)  Resp 20  Wt 171 lb 4 oz (77.678 kg)  SpO2 95% Gen: Afebrile. In a wheel chair (not usually), weak, fatigued, caucasian male. Marked change from last exam.  HENT: AT. [place]. Bilateral TM visualized and normal in appearance. Dry mucous membranes. Bilateral nares without erythema and swelling. Throat without erythema or exudates. No cough. Mild hoarseness of exam.  Eyes:Pupils Equal Round Reactive to light, Extraocular movements intact,  Conjunctiva without redness, discharge or icterus. Neck/lymp/endocrine: Supple, No lymphadenopathy CV:  bradycardic (37), irregular irregular. No edema.  Chest: CTAB, no wheeze or crackles. Normal respiratory effort. Good air movement.  Abd: Soft. NTND. BS  present Skin: No rashes, purpura or petechiae.  Neuro: In a wheel chair (weak). Difficulty standing for weight. PERLA. EOMi. Oriented x3, alert but fatigued. Answers appropriately.    Assessment & Plan:  1. Dehydration/Bradycardia/hypotension/fatigue - Exam consistent with dehydration, with hypotension and bradycardia. BP is 80/47 and HR 37-->44 today.  - Concern for AKI/RF with use of lasix and lisinopril during illness., without adequate hydration and low BP/HR/supply - Concern for progression of URI to PNA vs sepsis - Discussed care plan with Son who is with him today. Pt is to go to [facility] ED for IV fluids, cxr and labs.  - If pt is DC home, pt to be seen prior to weekend by either myself or his PCP.

## 2015-12-29 NOTE — Progress Notes (Signed)
Transferring patient to [place] for possibility of temporary pacing.  Report called to [facility] on [facility].  Sanda Linger

## 2015-12-29 NOTE — ED Notes (Signed)
MD at bedside.  EDP [facility] PRESENT  

## 2015-12-29 NOTE — ED Notes (Signed)
CARDIOLOGY PA PRESENT SPEAKING TO PT AND FAMILY

## 2015-12-29 NOTE — Progress Notes (Signed)
Patient admitted to 3w14 from [facility], patient bradycardic on arrival. MD asked for Zoll pads to be placed and zoll to be brought to the room withTemporary pacer as a possibility. Decision made to transfer patient to unit where temp pacing can be done. Transferring patient to [facility]

## 2015-12-29 NOTE — H&P (Signed)
[facility] History and Physical  Micheal Nicholson [id number] DOB: 1920/03/27 DOA: 12/29/2015  Referring physician: ED physician, Dr. Venora Maples  PCP: Micheal Post, MD   Chief Complaint: Hypotension and bradycardia noted at the PCP office and pt referred to ED  HPI:  Pt is 82 yo male with hx of PAF and trifascicular block, recent URI 12/23/2015 (strep negative), chronic combined systolic and diastolic heart failure, had stress myocardial perfusion study in 2006 that suggested inferior wall scar at which time patient underwent coronary angiography which was notable for 30% narrowing in the proximal to mid LAD artery, EF at that time was 45%, per last echocardiogram September 2015 with EF 30-35% and followup nuclear study showed a small apical inferolateral fixed defect, without reversible ischemia, has seen Dr. [name], cardiologist August 2016. Patient was sent to [facility] ED from PCP office for evaluation of noted hypotension of 88/50 and bradycardia with heart rate between 30s and 40s. Patient reports 1 week duration of progressively worsening fatigue, poor oral intake, intermittent headaches mostly located in bilateral frontal areas, throbbing and 5/10 in severity when present, not radiating, no specific alleviating or aggravating factors. Patient explains that ever since he was diagnosed with URI he was not getting much better. He denies fevers or chills, no specific abdominal or urinary concerns. Patient reports compliance with medications. Patient denies any visual changes, no chest pain, no shortness of breath.  In emergency department, initial blood pressure noted to be 80/47 with heart rate 33-45 bpm. blood work notable for Na 131, Cr 2.33, troponin 0.07. Cardiology consulted and hospitalists team asked to admit to step down unit at [facility]  Assessment and Plan:  Principal Problem:   Hypotension - likely related to dehydration and use of concomitant use of  antihypertensives  - Patient takes Coreg 3.125 mg by mouth twice a day, Lasix 40 mg twice a day, lisinopril 40 mg daily at home - Hold all 3 medications for now until blood pressure stabilizes - Monitor closely in step down unit - pt afebrile and lactic acid WNL so no further indication for sepsis work up  Active Problems:   Paroxysmal atrial fibrillation ([place]) - Holding Coreg due to bradycardia, continuing eliquis as per home medical regimen - Cardiology consulted, follow-up on recommendations    Trifascicular block - Per cardiology    Chronic combined systolic and diastolic CHF, NYHA class 2 (HCC) - On Lasix at home 40 mg twice a day which we'll hold for now - Weight on admission 171 LBS - Monitor daily weights, strict I/O - follow-up on cardiology recommendations    Bradycardia - Hold Coreg, per cardiology    Acute renal failure (ARF) ([place]) - Stop Lasix and lisinopril for now - Provide IV fluids, repeat BMP in the morning    Hyponatremia - Appears to be prerenal in etiology, IV fluids as noted above and repeat BMP in the morning    Anxiety state - Continue Xanax as needed per home medical regimen    Elevated troponin - Likely demand ischemia in the setting of acute renal failure and dehydration - Cycle and keep on cardiac monitor    GERD (gastroesophageal reflux disease) - Continue PPI  DVT prophylaxis - pt on Eliquis   Radiological Exams on Admission: CXR 12/29/2015  Chronic findings.  No evidence of acute cardiopulmonary disease.   Code Status: Full Family Communication: Pt and son at bedside Disposition Plan: Admit for further evaluation, admit to [place] [id number]  Review of Systems:  Constitutional: Negative for diaphoresis.  HENT: Negative for hearing loss, ear pain, neck pain, tinnitus and ear discharge.   Eyes: Negative for blurred vision, double vision, photophobia, pain, discharge and redness.  Respiratory: Negative for cough, wheezing  and stridor.   Cardiovascular: Negative for chest pain, palpitations, orthopnea, claudication and leg swelling.  Gastrointestinal: Negative for heartburn, constipation, blood in stool and melena.  Genitourinary: Negative for dysuria, urgency, frequency, hematuria and flank pain.  Musculoskeletal: Negative for myalgias, back pain, joint pain and falls.  Skin: Negative for itching and rash.  Neurological: Negative for tingling, tremors, sensory change, speech change, focal weakness, loss of consciousness.  Endo/Heme/Allergies: Negative for environmental allergies and polydipsia. Does not bruise/bleed easily.  Psychiatric/Behavioral: Negative for suicidal ideas. The patient is not nervous/anxious.      Past Medical History  Diagnosis Date  . Hypertension   . Anxiety   . BPH (benign prostatic hyperplasia)   . Chronic combined systolic and diastolic CHF, NYHA class 2 ([place]) 04/17/2015    Past Surgical History  Procedure Laterality Date  . Cardiac catheterization      Negative / 4 years ago    Social History:  reports that he has quit smoking. He has never used smokeless tobacco. He reports that he drinks alcohol. He reports that he does not use illicit drugs.  Allergies  Allergen Reactions  . Chocolate Other (See Comments)    Acid reflux    Family History  Problem Relation Age of Onset  . Pneumonia Father 45    died  . Lung cancer Brother   . Heart disease Brother   . Pancreatic cancer Son   . Healthy Son   . Heart failure Son     Medication Sig  ALPRAZolam (XANAX) 0.5 MG tablet TAKE 1 TABLET TWICE A DAY AS NEEDED  carvedilol (COREG) 3.125 MG tablet Take 1 tablet  by mouth 2 (two) times daily with a meal.  Dutasteride-Tamsulosin HCl 0.5-0.4 MG TAKE 1 CAPSULE BY MOUTH DAILY  ELIQUIS 2.5 MG TABS tablet TAKE 1 TABLET BY MOUTH TWICE A DAY  fluticasone 50 MCG/ACT nasal spray Place 2 sprays into both nostrils 2 (two) times daily.   furosemide (LASIX) 40 MG tablet Take 1 tablet  (40 mg total) by mouth 2 (two) times daily.  KLOR-CON M10 10 MEQ tablet Take 1 tablet by mouth daily.  lisinopril (PRINIVIL,ZESTRIL) 40 MG tablet TAKE 1 TABLET BY MOUTH EVERY DAY  omeprazole (PRILOSEC) 20 MG capsule TAKE ONE CAPSULE BY MOUTH EVERY DAY  pramipexole (MIRAPEX) 0.125 MG tablet Take 1 tablet (0.125 mg total) by mouth at bedtime.  sertraline (ZOLOFT) 100 MG tablet TAKE 1.5 TABLETS BY MOUTH DAILY   Physical Exam: Filed Vitals:   12/29/15 0927 12/29/15 0953 12/29/15 1003 12/29/15 1015  BP: 97/65  119/62 118/58  Pulse: 37  35 45  Resp: 11  13 20   Height:  5\' 10"  (1.778 m)    Weight:  77.565 kg (171 lb)    SpO2: 97%  100% 98%    Physical Exam  Constitutional: Appears well-developed and well-nourished. No distress.  HENT: Normocephalic. External right and left ear normal. Dry MM Eyes: Conjunctivae and EOM are normal. PERRLA, no scleral icterus.  Neck: Normal ROM. Neck supple. No JVD. No tracheal deviation. No thyromegaly.  CVS: RRR,  no gallops, no carotid bruit.  Pulmonary: Effort and breath sounds normal, no stridor, diminished breath sounds at bases  Abdominal: Soft. BS +,  no distension, tenderness, rebound  or guarding.  Musculoskeletal: Normal range of motion.  Lymphadenopathy: No lymphadenopathy noted, cervical, inguinal. Neuro: Alert. Normal reflexes, muscle tone coordination. No cranial nerve deficit. Skin: Skin is warm and dry. No rash noted. Not diaphoretic. No erythema. No pallor.  Psychiatric: Normal mood and affect. Behavior, judgment, thought content normal.   Labs on Admission:  Basic Metabolic Panel:  Recent Labs Lab 12/29/15 0934 12/29/15 0944  NA 131* 130*  K 3.9 3.8  CL 94* 93*  CO2 25  --   GLUCOSE 107* 100*  BUN 64* 58*  CREATININE 2.33* 2.20*  CALCIUM 8.8*  --   MG 2.5*  --    Liver Function Tests:  Recent Labs Lab 12/29/15 0934  AST 29  ALT 21  ALKPHOS 55  BILITOT 1.6*  PROT 6.7  ALBUMIN 3.7   CBC:  Recent Labs Lab  12/29/15 0934 12/29/15 0944  WBC 4.9  --   NEUTROABS 3.3  --   HGB 12.3* 13.3  HCT 35.4* 39.0  MCV 87.6  --   PLT 150  --    Cardiac Enzymes:  Recent Labs Lab 12/29/15 0934  TROPONINI 0.07*   EKG: bradycardia   If 7PM-7AM, please contact night-coverage [email or web address] Password [email or web address] 12/29/2015, 10:49 AM

## 2015-12-29 NOTE — ED Provider Notes (Signed)
CSN: [id number]     Arrival date & time 12/29/15  0908 History   First MD Initiated Contact with Patient 12/29/15 0932     Chief Complaint  Patient presents with  . Hypotension  . Cough     HPI Patient presents to the emergency department from primary care office where he is noted to be bradycardic in week.  Patient reports productive cough for the past week.  At the physician's office his blood pressure is 80/47.  His heart rate was noted to be in the 30s and 40s.  He presents to the emergency department with complaints of generalized weakness at this time.  He denies active chest pain.  No prior history of bradycardia.  Denies changes medications.  He is on Coreg.  He reports decreased oral intake over the past several days.  He reports his urine has been darker in color.  No dysuria urinary frequency.  Denies abdominal pain.  Reports productive cough.  Symptoms are moderate in severity.  Reports weakness when ambulating.  Denies syncope.  Past Medical History  Diagnosis Date  . Hypertension   . Anxiety   . BPH (benign prostatic hyperplasia)   . Chronic combined systolic and diastolic CHF, NYHA class 2 ([place]) 04/17/2015   Past Surgical History  Procedure Laterality Date  . Cardiac catheterization      Negative / 4 years ago   Family History  Problem Relation Age of Onset  . Pneumonia Father 65    died  . Lung cancer Brother   . Heart disease Brother   . Pancreatic cancer Son   . Healthy Son   . Heart failure Son    Social History  Substance Use Topics  . Smoking status: Former Smoker -- 0.50 packs/day for 5 years  . Smokeless tobacco: Never Used  . Alcohol Use: Yes     Comment: occ    Review of Systems  All other systems reviewed and are negative.     Allergies  Chocolate  Home Medications   Prior to Admission medications   Medication Sig Start Date End Date Taking? Authorizing Provider  ALPRAZolam (XANAX) 0.5 MG tablet TAKE 1 TABLET TWICE A DAY AS  NEEDED Patient taking differently: TAKE 1 TABLET TWICE A DAY AS NEEDED FOR NERVES 10/21/15  Yes [name], MD  carvedilol (COREG) 3.125 MG tablet Take 1 tablet (3.125 mg total) by mouth 2 (two) times daily with a meal. 05/18/15  Yes Isaiah Serge, NP  Dutasteride-Tamsulosin HCl 0.5-0.4 MG CAPS TAKE 1 CAPSULE BY MOUTH DAILY 10/20/15  Yes [name], MD  ELIQUIS 2.5 MG TABS tablet TAKE 1 TABLET BY MOUTH TWICE A DAY 09/09/15  Yes Mihai Croitoru, MD  fluticasone (FLONASE) 50 MCG/ACT nasal spray Place 2 sprays into both nostrils daily.   Yes Historical Provider, MD  furosemide (LASIX) 40 MG tablet Take 1 tablet (40 mg total) by mouth 2 (two) times daily. 05/25/15  Yes Mihai Croitoru, MD  guaiFENesin (MUCINEX) 600 MG 12 hr tablet Take 1 tablet (600 mg total) by mouth 2 (two) times daily. 12/23/15  Yes Renee A Kuneff, DO  KLOR-CON M10 10 MEQ tablet Take 1 tablet by mouth daily. 07/23/15  Yes Historical Provider, MD  lisinopril (PRINIVIL,ZESTRIL) 40 MG tablet TAKE 1 TABLET BY MOUTH EVERY DAY 09/09/15  Yes Mihai Croitoru, MD  omeprazole (PRILOSEC) 20 MG capsule TAKE ONE CAPSULE BY MOUTH EVERY DAY 09/09/15  Yes [name], MD  Camphor-Eucalyptus-Menthol (VICKS VAPORUB EX) Apply  1 application topically as needed (to help breathe).    Historical Provider, MD  pramipexole (MIRAPEX) 0.125 MG tablet Take 1 tablet (0.125 mg total) by mouth at bedtime. 09/09/15   [name], MD  sertraline (ZOLOFT) 100 MG tablet TAKE 1.5 TABLETS BY MOUTH DAILY 10/25/15   [name], MD  Vitamin D, Ergocalciferol, (DRISDOL) 50000 UNITS CAPS capsule Take 1 capsule (50,000 Units total) by mouth every 7 (seven) days. 03/08/15   [name], MD   BP 119/62 mmHg  Pulse 35  Temp(Src)   Resp 13  Ht 5\' 10"  (1.778 m)  Wt 171 lb (77.565 kg)  BMI 24.54 kg/m2  SpO2 100% Physical Exam  Constitutional: He is oriented to person, place, and time. He appears well-developed and well-nourished.  HENT:  Head:  Normocephalic and atraumatic.  Eyes: EOM are normal.  Neck: Normal range of motion.  Cardiovascular: Normal heart sounds and intact distal pulses.   Bradycardic  Pulmonary/Chest: Effort normal and breath sounds normal. No respiratory distress.  Abdominal: Soft. He exhibits no distension. There is no tenderness.  Musculoskeletal: Normal range of motion.  Neurological: He is alert and oriented to person, place, and time.  Skin: Skin is warm and dry.  Psychiatric: He has a normal mood and affect. Judgment normal.  Nursing note and vitals reviewed.   ED Course  Procedures (including critical care time) Labs Review Labs Reviewed  CBC WITH DIFFERENTIAL/PLATELET - Abnormal; Notable for the following:    RBC 4.04 (*)    Hemoglobin 12.3 (*)    HCT 35.4 (*)    All other components within normal limits  COMPREHENSIVE METABOLIC PANEL - Abnormal; Notable for the following:    Sodium 131 (*)    Chloride 94 (*)    Glucose, Bld 107 (*)    BUN 64 (*)    Creatinine, Ser 2.33 (*)    Calcium 8.8 (*)    Total Bilirubin 1.6 (*)    GFR calc non Af Amer 23 (*)    GFR calc Af Amer 26 (*)    All other components within normal limits  MAGNESIUM - Abnormal; Notable for the following:    Magnesium 2.5 (*)    All other components within normal limits  TROPONIN I - Abnormal; Notable for the following:    Troponin I 0.07 (*)    All other components within normal limits  I-STAT CHEM 8, ED - Abnormal; Notable for the following:    Sodium 130 (*)    Chloride 93 (*)    BUN 58 (*)    Creatinine, Ser 2.20 (*)    Glucose, Bld 100 (*)    Calcium, Ion 1.06 (*)    All other components within normal limits  I-STAT CG4 LACTIC ACID, ED    BUN  Date Value Ref Range Status  12/29/2015 58* 6 - 20 mg/dL Final  12/29/2015 64* 6 - 20 mg/dL Final  05/27/2015 20 6 - 23 mg/dL Final  05/18/2015 16 6 - 23 mg/dL Final   CREAT  Date Value Ref Range Status  05/18/2015 1.16 0.50 - 1.35 mg/dL Final   CREATININE,  SER  Date Value Ref Range Status  12/29/2015 2.20* 0.61 - 1.24 mg/dL Final  12/29/2015 2.33* 0.61 - 1.24 mg/dL Final  05/27/2015 1.18 0.40 - 1.50 mg/dL Final  05/13/2015 1.01 0.40 - 1.50 mg/dL Final     Imaging Review Dg Chest Portable 1 View  12/29/2015  CLINICAL DATA:  Bradycardia, weakness, cough EXAM: PORTABLE  CHEST 1 VIEW COMPARISON:  03/14/2015 FINDINGS: Chronic cardiopericardial enlargement and aortic tortuosity. Stable upper mediastinal widening, likely ectatic vessels. Interstitial coarsening which is at the patient's baseline. No suspected pulmonary edema. No pneumonia, effusion, or pneumothorax. IMPRESSION: Chronic findings.  No evidence of acute cardiopulmonary disease. Electronically Signed   By: Monte Fantasia M.D.   On: 12/29/2015 09:57   I have personally reviewed and evaluated these images and lab results as part of my medical decision-making.   EKG Interpretation   Date/Time:  Wednesday December 29 2015 09:26:41 EST Ventricular Rate:  41 PR Interval:    QRS Duration: 188 QT Interval:  626 QTC Calculation: 517 R Axis:   -74 Text Interpretation:  Atrial fibrillation RBBB and LAFB Junctional  bradycardia changed from prior ecg Confirmed by Tracyann Duffell  MD, Rylin Saez ([id number])  on 12/29/2015 10:27:25 AM      MDM   Final diagnoses:  None   Pt will be admitted for ongoing slow afib. Bradycardia. May be the cause of his renal failure. Will hydrate now. BP [id number] systolic with rate between 35-42.  Cardiology to evaluate.  Likely need to hold beta blocker.  Patient be [facility].  Admit to hospitalist.    Jola Schmidt, MD 12/30/15 1650

## 2015-12-29 NOTE — ED Notes (Signed)
CARELINK DEPARTED WITH PT IN NAD. TRANSFER TO [facility]

## 2015-12-29 NOTE — Consult Note (Signed)
Reason for Consult: new a fib with bradycardia -hypotensive  Referring Physician: Dr. Venora Maples and Dr. Doyle Askew   PCP:  [name], MD  Primary Cardiologist:Dr. Croitoru  Micheal Nicholson is an 82 y.o. male.    Chief Complaint: hypotensive in PCP office and new a fib and slow rate.    HPI:   Asked to see 82 year old male with recent sore throat 12/23/15 and URI. Strep was negative.  He does have a hx of PAF and hx of trifascicular block  With PR > 400 ms. BB was decreased.    Other hx 2006, he underwent a stress myocardial perfusion study that suggested an inferior wall scar and he underwent coronary angiography. He had a 30% narrowing in the proximal to mid LAD artery following the takeoff of the first septal and first diagonal branches but no other coronary lesions. His ejection fraction was around 45%. LVEDP was 16 mm Hg and there was no serious valvular abnormality. In 2013 his echocardiogram again showed an ejection fraction around 45% with inferior wall hypokinesis. No valvular abnormalities are described. He had mild biatrial dilatation and mild right ventricular dilatation. In 2015 he presented with asymptomatic atrial fibrillation and his echocardiogram showed a reduction in LVEF to 30-35%. A followup nuclear study showed a small apical inferolateral fixed defect, without reversible ischemia. He has a history of stroke by brain MRI (asymptomatic).  Today was sent to ER for hypotension by PCP-  Pt has not had chest pain but has been weak with ambulation and having to hold to the wall or he begins to fall, no syncope.  Here in ER his was Cr elevated at 2.33 previously 1.18 or less. Na decreased at 130 -troponin 0.07  EKG: Atrial fibrillation  RBBB and LAFB  Junctional bradycardia  New deep T wave inversions in inf lat leads.  Pt going to [facility] to stepdown bed and may need T. Pacer today.  BP improved currently after 1 L of fluid. Pt denies fever only + cough and difficult  to get mucus up.  No diarrhea. He had not been eating or drinking well.  HR 12/23/15 was 58.  Past Medical History  Diagnosis Date  . Hypertension   . Anxiety   . BPH (benign prostatic hyperplasia)   . Chronic combined systolic and diastolic CHF, NYHA class 2 ([place]) 04/17/2015    Past Surgical History  Procedure Laterality Date  . Cardiac catheterization      Negative / 4 years ago    Family History  Problem Relation Age of Onset  . Pneumonia Father 51    died  . Lung cancer Brother   . Heart disease Brother   . Pancreatic cancer Son   . Healthy Son   . Heart failure Son    Social History:  reports that he has quit smoking. He has never used smokeless tobacco. He reports that he drinks alcohol. He reports that he does not use illicit drugs.  Allergies:  Allergies  Allergen Reactions  . Chocolate Other (See Comments)    Acid reflux    OUTPATIENT MEDICATIONS: No current facility-administered medications on file prior to encounter.   Current Outpatient Prescriptions on File Prior to Encounter  Medication Sig Dispense Refill  . ALPRAZolam (XANAX) 0.5 MG tablet TAKE 1 TABLET TWICE A DAY AS NEEDED (Patient taking differently: TAKE 1 TABLET TWICE A DAY AS NEEDED FOR NERVES) 60 tablet 2  . Camphor-Eucalyptus-Menthol (VICKS VAPORUB  EX) Apply 1 application topically as needed (to help breathe).    . carvedilol (COREG) 3.125 MG tablet Take 1 tablet (3.125 mg total) by mouth 2 (two) times daily with a meal. 60 tablet 6  . Dutasteride-Tamsulosin HCl 0.5-0.4 MG CAPS TAKE 1 CAPSULE BY MOUTH DAILY 90 capsule 3  . ELIQUIS 2.5 MG TABS tablet TAKE 1 TABLET BY MOUTH TWICE A DAY 60 tablet 3  . fluticasone (FLONASE) 50 MCG/ACT nasal spray Place 2 sprays into both nostrils 2 (two) times daily.     . furosemide (LASIX) 40 MG tablet Take 1 tablet (40 mg total) by mouth 2 (two) times daily. 60 tablet 11  . guaiFENesin (MUCINEX) 600 MG 12 hr tablet Take 1 tablet (600 mg total) by mouth 2 (two)  times daily. 30 tablet 0  . KLOR-CON M10 10 MEQ tablet Take 1 tablet by mouth daily.    Marland Kitchen lisinopril (PRINIVIL,ZESTRIL) 40 MG tablet TAKE 1 TABLET BY MOUTH EVERY DAY 30 tablet 3  . omeprazole (PRILOSEC) 20 MG capsule TAKE ONE CAPSULE BY MOUTH EVERY DAY 30 capsule 5  . pramipexole (MIRAPEX) 0.125 MG tablet Take 1 tablet (0.125 mg total) by mouth at bedtime. 30 tablet 5  . sertraline (ZOLOFT) 100 MG tablet TAKE 1.5 TABLETS BY MOUTH DAILY 45 tablet 3  . Vitamin D, Ergocalciferol, (DRISDOL) 50000 UNITS CAPS capsule Take 1 capsule (50,000 Units total) by mouth every 7 (seven) days. 30 capsule 0      Results for orders placed or performed during the hospital encounter of 12/29/15 (from the past 48 hour(s))  CBC with Differential/Platelet     Status: Abnormal   Collection Time: 12/29/15  9:34 AM  Result Value Ref Range   WBC 4.9 4.0 - 10.5 K/uL   RBC 4.04 (L) 4.22 - 5.81 MIL/uL   Hemoglobin 12.3 (L) 13.0 - 17.0 g/dL   HCT 35.4 (L) 39.0 - 52.0 %   MCV 87.6 78.0 - 100.0 fL   MCH 30.4 26.0 - 34.0 pg   MCHC 34.7 30.0 - 36.0 g/dL   RDW 12.9 11.5 - 15.5 %   Platelets 150 150 - 400 K/uL   Neutrophils Relative % 68 %   Neutro Abs 3.3 1.7 - 7.7 K/uL   Lymphocytes Relative 23 %   Lymphs Abs 1.1 0.7 - 4.0 K/uL   Monocytes Relative 7 %   Monocytes Absolute 0.4 0.1 - 1.0 K/uL   Eosinophils Relative 2 %   Eosinophils Absolute 0.1 0.0 - 0.7 K/uL   Basophils Relative 0 %   Basophils Absolute 0.0 0.0 - 0.1 K/uL  Comprehensive metabolic panel     Status: Abnormal   Collection Time: 12/29/15  9:34 AM  Result Value Ref Range   Sodium 131 (L) 135 - 145 mmol/L   Potassium 3.9 3.5 - 5.1 mmol/L   Chloride 94 (L) 101 - 111 mmol/L   CO2 25 22 - 32 mmol/L   Glucose, Bld 107 (H) 65 - 99 mg/dL   BUN 64 (H) 6 - 20 mg/dL   Creatinine, Ser 2.33 (H) 0.61 - 1.24 mg/dL   Calcium 8.8 (L) 8.9 - 10.3 mg/dL   Total Protein 6.7 6.5 - 8.1 g/dL   Albumin 3.7 3.5 - 5.0 g/dL   AST 29 15 - 41 U/L   ALT 21 17 - 63 U/L    Alkaline Phosphatase 55 38 - 126 U/L   Total Bilirubin 1.6 (H) 0.3 - 1.2 mg/dL   GFR calc non Af Wyvonnia Lora  23 (L) >60 mL/min   GFR calc Af Amer 26 (L) >60 mL/min    Comment: (NOTE) The eGFR has been calculated using the CKD EPI equation. This calculation has not been validated in all clinical situations. eGFR's persistently <60 mL/min signify possible Chronic Kidney Disease.    Anion gap 12 5 - 15  Magnesium     Status: Abnormal   Collection Time: 12/29/15  9:34 AM  Result Value Ref Range   Magnesium 2.5 (H) 1.7 - 2.4 mg/dL  Troponin I     Status: Abnormal   Collection Time: 12/29/15  9:34 AM  Result Value Ref Range   Troponin I 0.07 (H) <0.031 ng/mL    Comment:        PERSISTENTLY INCREASED TROPONIN VALUES IN THE RANGE OF 0.04-0.49 ng/mL CAN BE SEEN IN:       -UNSTABLE ANGINA       -CONGESTIVE HEART FAILURE       -MYOCARDITIS       -CHEST TRAUMA       -ARRYHTHMIAS       -LATE PRESENTING MYOCARDIAL INFARCTION       -COPD   CLINICAL FOLLOW-UP RECOMMENDED.   I-Stat Chem 8, ED     Status: Abnormal   Collection Time: 12/29/15  9:44 AM  Result Value Ref Range   Sodium 130 (L) 135 - 145 mmol/L   Potassium 3.8 3.5 - 5.1 mmol/L   Chloride 93 (L) 101 - 111 mmol/L   BUN 58 (H) 6 - 20 mg/dL   Creatinine, Ser 2.20 (H) 0.61 - 1.24 mg/dL   Glucose, Bld 100 (H) 65 - 99 mg/dL   Calcium, Ion 1.06 (L) 1.13 - 1.30 mmol/L   TCO2 23 0 - 100 mmol/L   Hemoglobin 13.3 13.0 - 17.0 g/dL   HCT 39.0 39.0 - 52.0 %  I-Stat CG4 Lactic Acid, ED     Status: None   Collection Time: 12/29/15  9:45 AM  Result Value Ref Range   Lactic Acid, Venous 0.90 0.5 - 2.0 mmol/L   Dg Chest Portable 1 View  12/29/2015  CLINICAL DATA:  Bradycardia, weakness, cough EXAM: PORTABLE CHEST 1 VIEW COMPARISON:  03/14/2015 FINDINGS: Chronic cardiopericardial enlargement and aortic tortuosity. Stable upper mediastinal widening, likely ectatic vessels. Interstitial coarsening which is at the patient's baseline. No suspected  pulmonary edema. No pneumonia, effusion, or pneumothorax. IMPRESSION: Chronic findings.  No evidence of acute cardiopulmonary disease. Electronically Signed   By: Monte Fantasia M.D.   On: 12/29/2015 09:57   Cardiac Cath:  2006 ANGIOGRAPHIC DATA: The left coronary artery arises and distributes normally. The left main coronary is normal.  Left anterior descending artery has 30% narrowing following the takeoff of the first septal perforator and first diagonal branch. The remainder of the vessel is normal.  Left circumflex coronary gives rise to two marginal branches and is normal.  The right coronary is a large dominant vessel and is normal.  The left ventricular angiography was performed in RAO view. This demonstrates mild global hypokinesia with overall mildly impaired left ventricular systolic function. Ejection fraction is estimated 45%. There is no mitral regurgitation or prolapse.  FINAL INTERPRETATION: 1. Mild nonobstructive atherosclerotic coronary artery disease. 2. Mildly impaired left ventricular systolic function.  ECHO 2015  Study Conclusions  - Left ventricle: The cavity size was normal. Wall thickness was normal. Systolic function was moderately to severely reduced. The estimated ejection fraction was in the range of 30% to 35%. - Aortic valve: There  was mild regurgitation. - Mitral valve: There was mild regurgitation. - Right ventricle: The cavity size was mildly dilated. Systolic function was mildly reduced. - Right atrium: The atrium was mildly to moderately dilated.  NUC STUDY 2015 Exercise Capacity: Lexiscan with no exercise. BP Response: Normal blood pressure response. Clinical Symptoms: No significant symptoms noted. ECG Impression: No significant ST segment change suggestive of ischemia. Comparison with Prior Nuclear Study: No significant change from previous study  Overall Impression: Low risk stress nuclear study a  small region of apical lateral inferior scar without ischemia.  LV Wall Motion: Not gated   ROS: General:+ colds beginning with sore throat 12/23/15 no fevers, no weight changes Skin:no rashes or ulcers HEENT:no blurred vision, no congestion CV:see HPI PUL:see HPI GI:no diarrhea some constipation but relief with laxative no melena, no indigestion GU:no hematuria, no dysuria MS:no joint pain, no claudication Neuro:no syncope, + lightheadedness- pt forgets he has to hold onto things Endo:no diabetes, no thyroid disease   Blood pressure 118/58, pulse 45, resp. rate 20, height _0  (1.778 m), weight 171 lb (77.565 kg), SpO2 98 %.  Wt Readings from Last 3 Encounters:  12/29/15 171 lb (77.565 kg)  12/29/15 171 lb 4 oz (77.678 kg)  12/23/15 172 lb 4 oz (78.132 kg)    PE: General:Pleasant affect, NAD--BP on arrival 80/47 now 117/76 after 1 L fluid Skin:Warm and dry, brisk capillary refill HEENT:normocephalic, sclera clear, mucus membranes moist- hard of hearing Neck:supple, no JVD, no bruits  Heart:irregular but slow without murmur, gallup, rub or click Lungs: without rales, + rhonchi, occ wheezes QPY:PPJK, non tender, + BS, do not palpate liver spleen or masses Ext:no lower ext edema, 2+ pedal pulses, 2+ radial pulses Neuro:alert and oriented X 3, MAE, follows commands, + facial symmetry    Assessment/Plan Acute Bradycardia- HR to 27 at times, took meds today including coreg and lisinopril - and Eliquis.  New A fib though he has a hx of a fib on Eliquis.  Pt symptomatic with hypotension and weakness and dizziness.  Hx of RBBB and LAFB transferring to [facility] to keep pt closer to cath lab for possible Temp pacer, briefly discussed with son and pt.  They would like to proceed if needed. Hold BB.   PAF now in a fib.  Dehydration possible viral illness beginning with pharyngitis in Dec. And has progressed with chest congestion productive cough - has not been eating or drinking and has  been taking his lasix.  Hypotension/Bradycardia could be related to dehydration , though in a fib in past  EF was 30-35%.    Acute renal failure related to dehydration and continuing lasix and lisinopril hold for now.    ABNORMAL EKG with deep Tw[place] inversions inf lat leads. Monitor troponin may be due to hypotension  Currently anticoagulated with Eliquis  URI CXR without PNA.   Cardiomyopathy with last EF 30-35% in a fib.    CAD non obstructive on cath 2006 and neg nuc 2015.           Venus Practitioner Certified [place] Pager [phone] or after 5pm or weekends call [phone] 12/29/2015, 11:07 AM

## 2015-12-29 NOTE — ED Notes (Signed)
Pt sent from drs office.  Has been having productive cough x 1 wk.  Was hypotensive in drs office at 80/47.  92/52 now.  Unable to get temp in triage.  Will need rectal temp.  Pt A&O x 4.

## 2015-12-30 DIAGNOSIS — R627 Adult failure to thrive: Secondary | ICD-10-CM

## 2015-12-30 DIAGNOSIS — N179 Acute kidney failure, unspecified: Secondary | ICD-10-CM | POA: Diagnosis present

## 2015-12-30 DIAGNOSIS — N19 Unspecified kidney failure: Secondary | ICD-10-CM

## 2015-12-30 LAB — BASIC METABOLIC PANEL
Anion gap: 10 (ref 5–15)
BUN: 50 mg/dL — ABNORMAL HIGH (ref 6–20)
CALCIUM: 8.6 mg/dL — AB (ref 8.9–10.3)
CO2: 26 mmol/L (ref 22–32)
CREATININE: 1.76 mg/dL — AB (ref 0.61–1.24)
Chloride: 101 mmol/L (ref 101–111)
GFR, EST AFRICAN AMERICAN: 37 mL/min — AB (ref >60)
GFR, EST NON AFRICAN AMERICAN: 32 mL/min — AB (ref >60)
Glucose, Bld: 99 mg/dL (ref 65–99)
Potassium: 3.9 mmol/L (ref 3.5–5.1)
Sodium: 137 mmol/L (ref 135–145)

## 2015-12-30 LAB — CBC
HCT: 31.7 % — ABNORMAL LOW (ref 39.0–52.0)
Hemoglobin: 11.2 g/dL — ABNORMAL LOW (ref 13.0–17.0)
MCH: 30.3 pg (ref 26.0–34.0)
MCHC: 35.3 g/dL (ref 30.0–36.0)
MCV: 85.7 fL (ref 78.0–100.0)
PLATELETS: 120 10*3/uL — AB (ref 150–400)
RBC: 3.7 MIL/uL — AB (ref 4.22–5.81)
RDW: 12.7 % (ref 11.5–15.5)
WBC: 3.9 10*3/uL — ABNORMAL LOW (ref 4.0–10.5)

## 2015-12-30 LAB — TROPONIN I: TROPONIN I: 0.07 ng/mL — AB (ref <0.031)

## 2015-12-30 MED ORDER — SODIUM CHLORIDE 0.9 % IV SOLN: INTRAVENOUS | Status: AC

## 2015-12-30 MED ORDER — ENSURE ENLIVE PO LIQD
237.0000 mL | Freq: Two times a day (BID) | ORAL | Status: DC
  Administered 2015-12-30 – 2016-01-03 (×6): 237 mL via ORAL

## 2015-12-30 MED ORDER — DM-GUAIFENESIN ER 30-600 MG PO TB12
1.0000 | ORAL_TABLET | Freq: Two times a day (BID) | ORAL | Status: DC
  Administered 2015-12-30 – 2016-01-03 (×8): 1 via ORAL
  Filled 2015-12-30 (×8): qty 1

## 2015-12-30 NOTE — Progress Notes (Signed)
[place] TEAM 1 - Stepdown/ICU TEAM Progress Note  Micheal Nicholson [id number] DOB: Feb 14, 1920 DOA: 12/29/2015 PCP: [name], MD  Admit HPI / Brief Narrative: 82 yo WM [employer] vet PMHx Paroxysmal Atrial Fibrillation and Trifascicular block, Chronic combined Systolic and Diastolic CHF, stress myocardial perfusion study in 2006 that suggested inferior wall scar at which time patient underwent coronary angiography which was notable for 30% narrowing in the proximal to mid LAD artery, EF at that time was 45%, per Last Echocardiogram September 2015 with EF 30-35% and followup nuclear study showed a small apical inferolateral fixed defect, without reversible ischemia, has seen Dr. [name], cardiologist August 2016.  Recent URI 12/23/2015 (strep negative). Patient was sent to [facility] ED from PCP office for evaluation of noted hypotension of 88/50 and bradycardia with heart rate between 30s and 40s. Patient reports 1 week duration of progressively worsening fatigue, poor oral intake, intermittent headaches mostly located in bilateral frontal areas, throbbing and 5/10 in severity when present, not radiating, no specific alleviating or aggravating factors. Patient explains that ever since he was diagnosed with URI he was not getting much better. He denies fevers or chills, no specific abdominal or urinary concerns. Patient reports compliance with medications. Patient denies any visual changes, no chest pain, no shortness of breath.  In emergency department, initial blood pressure noted to be 80/47 with heart rate 33-45 bpm. blood work notable for Na 131, Cr 2.33, troponin 0.07. Cardiology consulted and hospitalists team asked to admit to step down unit at [facility].  HPI/Subjective: 1/5 A/O 4, NAD, negative CP, negative SOB. Positive productive cough yellow.   Assessment/Plan: Hypotension - likely related to dehydration and use of concomitant use of antihypertensives  - Hold all BP  medication (takes Coreg 3.125 mg by mouth twice a day, Lasix 40 mg twice a day, lisinopril 40 mg daily at home) -Orthostatic vitals in the A.m.  Paroxysmal atrial fibrillation (HCC) - Holding Coreg due to bradycardia, continuing eliquis as per home medical regimen - Cardiology consulted, follow-up on recommendations  Trifascicular block - Per cardiology  Dilated Cardiomyopathy/Chronic combined systolic and diastolic CHF, NYHA class 2 (HCC) - Weight on admission= 77.5 kg           1/5 bed weight= 74.5 kg - Monitor daily weights, strict I/O; since admission -711ml - follow-up on cardiology recommendations  Bradycardia - Hold Coreg, per cardiology  Acute renal failure (ARF) (baseline Cr~1.16) - Stop Lasix and lisinopril for now -Cr improving; 2.33--1.76  Hyponatremia - Resolved   Anxiety state - Continue Xanax 0.5 mg  BID; home regimen  Elevated troponin - Likely demand ischemia in the setting of acute renal failure and dehydration - Cycle and keep on cardiac monitor  GERD (gastroesophageal reflux disease) - Continue PPI   Code Status: FULL Family Communication: no family present at time of exam Disposition Plan: Per cardiology    Consultants: Dr.Philip J Nahser Cardiology  Procedure/Significant Events:    Culture   Antibiotics: NA  DVT prophylaxis: Eliquis   Devices NA  LINES / TUBES:  NA    Continuous Infusions:    Objective: VITAL SIGNS: Temp: 97.8 F (36.6 C) (01/05 1630) Temp Source: Oral (01/05 1630) BP: 134/73 mmHg (01/05 1630) Pulse Rate: 52 (01/05 1149) SPO2; FIO2:   Intake/Output Summary (Last 24 hours) at 12/30/15 1825 Last data filed at 12/30/15 1800  Gross per 24 hour  Intake   1335 ml  Output   2775 ml  Net  -1440  ml     Exam: General:  A/O 4, NAD, negative CP, negative SOB. Positive productive cough yellow. Eyes: Negative headache, eye pain, double vision,negative scleral hemorrhage ENT: Negative Runny nose,  negative ear pain, negative gingival bleeding, Neck:  Negative scars, masses, torticollis, lymphadenopathy, JVD Lungs: Clear to auscultation bilaterally without wheezes or crackles Cardiovascular: Regular rate and rhythm without murmur gallop or rub normal S1 and S2 Abdomen:negative abdominal pain, nondistended, positive soft, bowel sounds, no rebound, no ascites, no appreciable mass Extremities: No significant cyanosis, clubbing, or edema bilateral lower extremities Psychiatric:  Negative depression, negative anxiety, negative fatigue, negative mania  Neurologic:  Cranial nerves II through XII intact, tongue/uvula midline, all extremities muscle strength 5/5, sensation intact throughout, negative dysarthria, negative expressive aphasia, negative receptive aphasia.   Data Reviewed: Basic Metabolic Panel:  Recent Labs Lab 12/29/15 0934 12/29/15 0944 12/29/15 1420 12/30/15 0139  NA 131* 130*  --  137  K 3.9 3.8  --  3.9  CL 94* 93*  --  101  CO2 25  --   --  26  GLUCOSE 107* 100*  --  99  BUN 64* 58*  --  50*  CREATININE 2.33* 2.20*  --  1.76*  CALCIUM 8.8*  --   --  8.6*  MG 2.5*  --  2.5*  --   PHOS  --   --  4.8*  --    Liver Function Tests:  Recent Labs Lab 12/29/15 0934  AST 29  ALT 21  ALKPHOS 55  BILITOT 1.6*  PROT 6.7  ALBUMIN 3.7   No results for input(s): LIPASE, AMYLASE in the last 168 hours. No results for input(s): AMMONIA in the last 168 hours. CBC:  Recent Labs Lab 12/29/15 0934 12/29/15 0944 12/30/15 0139  WBC 4.9  --  3.9*  NEUTROABS 3.3  --   --   HGB 12.3* 13.3 11.2*  HCT 35.4* 39.0 31.7*  MCV 87.6  --  85.7  PLT 150  --  120*   Cardiac Enzymes:  Recent Labs Lab 12/29/15 0934 12/29/15 1420 12/29/15 1906 12/30/15 0135  TROPONINI 0.07* 0.06* 0.06* 0.07*   BNP (last 3 results)  Recent Labs  03/14/15 1737  BNP 813.5*    ProBNP (last 3 results) No results for input(s): PROBNP in the last 8760 hours.  CBG: No results for  input(s): GLUCAP in the last 168 hours.  No results found for this or any previous visit (from the past 240 hour(s)).   Studies:  Recent x-ray studies have been reviewed in detail by the Attending Physician  Scheduled Meds:  Scheduled Meds: . ALPRAZolam  1 mg Oral Once  . apixaban  2.5 mg Oral BID  . dextromethorphan-guaiFENesin  1 tablet Oral BID  . dutasteride  0.5 mg Oral Daily   And  . tamsulosin  0.4 mg Oral Daily  . feeding supplement (ENSURE ENLIVE)  237 mL Oral BID BM  . pantoprazole  40 mg Oral Daily    Time spent on care of this patient: 40 mins   WOODS, Geraldo Docker , MD  [facility] Office  [phone] Pager - [phone]  On-Call/Text Page:      [email or web address]      password [email or web address]  If 7PM-7AM, please contact night-coverage [email or web address] Password [email or web address] 12/30/2015, 6:25 PM   LOS: 1 day   Care during the described time interval was provided by me .  I have reviewed this patient's available data, including medical history, events of note, physical examination,  and all test results as part of my evaluation. I have personally reviewed and interpreted all radiology studies.   Dia Crawford, MD [phone] Pager

## 2015-12-30 NOTE — Progress Notes (Signed)
PROGRESS NOTE  Subjective:   82 yo with hx of atrialfib Presented with hypotension and bradycardia Still feels weak.  Objective:    Vital Signs:   Temp:  [97.3 F (36.3 C)-98.2 F (36.8 C)] 97.4 F (36.3 C) (01/05 0437) Pulse Rate:  [29-106] 32 (01/05 0437) Resp:  [11-24] 20 (01/05 0437) BP: (88-131)/(41-76) 131/55 mmHg (01/05 0800) SpO2:  [90 %-100 %] 98 % (01/05 0437) Weight:  [164 lb 3.9 oz (74.5 kg)-177 lb (80.287 kg)] 164 lb 3.9 oz (74.5 kg) (01/05 0437)      24-hour weight change: Weight change:   Weight trends: Filed Weights   12/29/15 0953 12/29/15 1310 12/30/15 0437  Weight: 171 lb (77.565 kg) 177 lb (80.287 kg) 164 lb 3.9 oz (74.5 kg)    Intake/Output:  01/04 0701 - 01/05 0700 In: 1600 [I.V.:1600] Out: 1800 [Urine:1800] Total I/O In: 50 [I.V.:50] Out: -    Physical Exam: BP 131/55 mmHg  Pulse 32  Temp(Src) 97.4 F (36.3 C) (Oral)  Resp 20  Ht 5\' 10"  (1.778 m)  Wt 164 lb 3.9 oz (74.5 kg)  BMI 23.57 kg/m2  SpO2 98%  Wt Readings from Last 3 Encounters:  12/30/15 164 lb 3.9 oz (74.5 kg)  12/29/15 171 lb 4 oz (77.678 kg)  12/23/15 172 lb 4 oz (78.132 kg)    General: Vital signs reviewed and noted.   Head: Normocephalic, atraumatic.  Eyes: conjunctivae/corneas clear.  EOM's intact.   Throat: normal  Neck:  normal   Lungs:    clear   Heart:  Irreg. Irreg. Brady   Abdomen:  Soft, non-tender, non-distended    Extremities: No edema    Neurologic: A&O X3, CN II - XII are grossly intact.   Psych: Normal     Labs: BMET:  Recent Labs  12/29/15 0934 12/29/15 0944 12/29/15 1420 12/30/15 0139  NA 131* 130*  --  137  K 3.9 3.8  --  3.9  CL 94* 93*  --  101  CO2 25  --   --  26  GLUCOSE 107* 100*  --  99  BUN 64* 58*  --  50*  CREATININE 2.33* 2.20*  --  1.76*  CALCIUM 8.8*  --   --  8.6*  MG 2.5*  --  2.5*  --   PHOS  --   --  4.8*  --     Liver function tests:  Recent Labs  12/29/15 0934  AST 29  ALT 21  ALKPHOS 55    BILITOT 1.6*  PROT 6.7  ALBUMIN 3.7   No results for input(s): LIPASE, AMYLASE in the last 72 hours.  CBC:  Recent Labs  12/29/15 0934 12/29/15 0944 12/30/15 0139  WBC 4.9  --  3.9*  NEUTROABS 3.3  --   --   HGB 12.3* 13.3 11.2*  HCT 35.4* 39.0 31.7*  MCV 87.6  --  85.7  PLT 150  --  120*    Cardiac Enzymes:  Recent Labs  12/29/15 0934 12/29/15 1420 12/29/15 1906 12/30/15 0135  TROPONINI 0.07* 0.06* 0.06* 0.07*    Coagulation Studies: No results for input(s): LABPROT, INR in the last 72 hours.  Other: Invalid input(s): POCBNP No results for input(s): DDIMER in the last 72 hours. No results for input(s): HGBA1C in the last 72 hours. No results for input(s): CHOL, HDL, LDLCALC, TRIG, CHOLHDL in the last 72 hours.  Recent Labs  12/29/15 1420  TSH 0.542   No results  for input(s): VITAMINB12, FOLATE, FERRITIN, TIBC, IRON, RETICCTPCT in the last 72 hours.   Other results:  EKG  ( personally reviewed )  - atrial fib with slow V response.  Medications:    Infusions: . sodium chloride 50 mL/hr at 12/30/15 0330    Scheduled Medications: . ALPRAZolam  1 mg Oral Once  . apixaban  2.5 mg Oral BID  . dutasteride  0.5 mg Oral Daily   And  . tamsulosin  0.4 mg Oral Daily  . pantoprazole  40 mg Oral Daily    Assessment/ Plan:   Principal Problem:   Hypotension Active Problems:   Anxiety state   GERD (gastroesophageal reflux disease)   Paroxysmal atrial fibrillation (HCC)   Trifascicular block   Chronic combined systolic and diastolic CHF, NYHA class 2 (HCC)   Bradycardia   Acute renal failure (ARF) (HCC)   Hyponatremia   Elevated troponin  1. Atrial fib  2. Bradycardia:   slighly better.   Continue to hold Coreg.   3. Failure to thrive:  Poor PO intake,  15 Lb weight loss over the past several months. This is an ominous sign.   TSH is normal.  Hopefully, we will be able to reverse this trend.   If he continues on this downhill course, a pacer  would not be of any benefit  Disposition:  Length of Stay: 1  Thayer Headings, Brooke Bonito., MD, [facility] 12/30/2015, 8:28 AM Office [phone] Pager [phone]

## 2015-12-30 NOTE — Care Management Note (Addendum)
Case Management Note  Patient Details  Name: Micheal Nicholson MRN: [id number] Date of Birth: [birth year]  Subjective/Objective:       Adm w hypotension             Action/Plan: lives w fam, pcp dr burchette  Expected Discharge Date:                  Expected Discharge Plan:     In-House Referral:     Discharge planning Services     Post Acute Care Choice:    Choice offered to:     DME Arranged:    DME Agency:     HH Arranged:    [place] Agency:     Status of Service:     Medicare Important Message Given:    Date Medicare IM Given:    Medicare IM give by:    Date Additional Medicare IM Given:    Additional Medicare Important Message give by:     If discussed at [place] of Stay Meetings, dates discussed:    Additional Comments: ur review done  Lacretia Leigh, RN 12/30/2015, 9:23 AM

## 2015-12-30 NOTE — Consult Note (Signed)
ELECTROPHYSIOLOGY CONSULT NOTE    Patient ID: Micheal Nicholson MRN: [id number], DOB/AGE: 80-Mar-1921 80 y.o.  Admit date: 12/29/2015 Date of Consult: 12/30/2015   Primary Physician: [name], MD Primary Cardiologist: Dr. [name]  Reason for Consultation: bradycardia  HPI: Micheal Nicholson is a 82 y.o. 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  There was a component of his hypotension thought to be attributed to dehydration with improvement after IVF and his BP has remained stable.  His presenting labs noting ARI with BUN/Creat 64/2.33, this morning 50/1.76.  Na+ was 130 today up to 137.  He was on low dose BB chronically at home, carvedilol 3.125mg  BID, last dose was 12/28/14 AM. The patient initially at [facility] and transferred yesterday to [facility] for further care and evaluation and potential need for temporary pacing.  Cardiology noted new EKG changes, Trop 0.06, 0.06, 0.07 and felt possibly secondary to hypotension  He has been moved to ICU secondary for closer monitoring and with rates about 30  His original c/o was of generalized weakness, having to have aid with ambulation like holding the walls to stay up, no syncope, a sore throat 12/23/15 and URI. No c/o CP.   He is feeling this morning a little better he says, not so weak.  He denies ever having any kind of CP, or SOB.  He had for a few days been with a sore throat and felt like he was coming down with a virus.  He denies any syncope.   Past Medical History  Diagnosis Date  . Hypertension   . Anxiety   . BPH (benign prostatic hyperplasia)   . Chronic combined systolic and diastolic CHF, NYHA class 2 ([place]) 04/17/2015  . CHF (congestive heart failure) ([place])   . Symptomatic  sinus bradycardia     WEAKNESS & HYPOTENSION  . GERD (gastroesophageal reflux disease)   . Arthritis      Surgical History:  Past Surgical History  Procedure Laterality Date  . Cardiac catheterization  2006    Negative / 4 years ago     Prescriptions prior to admission  Medication Sig Dispense Refill Last Dose  . ALPRAZolam (XANAX) 0.5 MG tablet TAKE 1 TABLET TWICE A DAY AS NEEDED (Patient taking differently: TAKE 1 TABLET TWICE A DAY AS NEEDED FOR NERVES) 60 tablet 2 12/29/2015 at Unknown time  . Camphor-Eucalyptus-Menthol (VICKS VAPORUB EX) Apply 1 application topically as needed (to help breathe).   12/29/2015 at Unknown time  . carvedilol (COREG) 3.125 MG tablet Take 1 tablet (3.125 mg total) by mouth 2 (two) times daily with a meal. 60 tablet 6 12/29/2015 at 0700  . Cholecalciferol (VITAMIN D PO) Take 1 tablet by mouth daily.   12/29/2015 at Unknown time  . Dutasteride-Tamsulosin HCl 0.5-0.4 MG CAPS TAKE 1 CAPSULE BY MOUTH DAILY 90 capsule 3 12/29/2015 at Unknown time  . ELIQUIS 2.5 MG TABS tablet TAKE 1 TABLET BY MOUTH TWICE A DAY 60 tablet 3 12/29/2015 at 0700  . fluticasone (FLONASE) 50 MCG/ACT nasal spray Place 2 sprays into both nostrils 2 (two) times daily.    12/29/2015 at Unknown time  . furosemide (LASIX) 40 MG tablet Take 1 tablet (40 mg total) by mouth 2 (two) times daily. [place]  tablet 11 12/28/2015 at Unknown time  . guaiFENesin (MUCINEX) 600 MG 12 hr tablet Take 1 tablet (600 mg total) by mouth 2 (two) times daily. 30 tablet 0 12/29/2015 at Unknown time  . KLOR-CON M10 10 MEQ tablet Take 1 tablet by mouth daily.   12/29/2015 at Unknown time  . lisinopril (PRINIVIL,ZESTRIL) 40 MG tablet TAKE 1 TABLET BY MOUTH EVERY DAY 30 tablet 3 unknown  . omeprazole (PRILOSEC) 20 MG capsule TAKE ONE CAPSULE BY MOUTH EVERY DAY 30 capsule 5 12/28/2015 at Unknown time  . pramipexole (MIRAPEX) 0.125 MG tablet Take 1 tablet (0.125 mg total) by mouth at bedtime. 30 tablet 5 unknown at Unknown time  . sertraline  (ZOLOFT) 100 MG tablet TAKE 1.5 TABLETS BY MOUTH DAILY 45 tablet 3 12/29/2015 at Unknown time  . Vitamin D, Ergocalciferol, (DRISDOL) 50000 UNITS CAPS capsule Take 1 capsule (50,000 Units total) by mouth every 7 (seven) days. 30 capsule 0 Taking    Inpatient Medications:  . ALPRAZolam  1 mg Oral Once  . apixaban  2.5 mg Oral BID  . dutasteride  0.5 mg Oral Daily   And  . tamsulosin  0.4 mg Oral Daily  . pantoprazole  40 mg Oral Daily    Allergies:  Allergies  Allergen Reactions  . Chocolate Other (See Comments)    Acid reflux    Social History   Social History  . Marital Status: Widowed    Spouse Name: N/A  . Number of Children: N/A  . Years of Education: N/A   Occupational History  . Not on file.   Social History Main Topics  . Smoking status: Former Smoker -- 0.50 packs/day for 5 years  . Smokeless tobacco: Never Used  . Alcohol Use: Yes     Comment: occ  . Drug Use: No  . Sexual Activity: Not on file   Other Topics Concern  . Not on file   Social History Narrative     Family History  Problem Relation Age of Onset  . Pneumonia Father 52    died  . Lung cancer Brother   . Heart disease Brother   . Pancreatic cancer Son   . Healthy Son   . Heart failure Son      Review of Systems: All other systems reviewed and are otherwise negative except as noted above.  Physical Exam: Filed Vitals:   12/29/15 1955 12/30/15 0002 12/30/15 0400 12/30/15 0437  BP: 102/54 100/57  111/48  Pulse: 33 34 47 32  Temp: 97.5 F (36.4 C) 97.4 F (36.3 C)  97.4 F (36.3 C)  TempSrc: Oral Oral  Oral  Resp: 16 15 15 20   Height:      Weight:    164 lb 3.9 oz (74.5 kg)  SpO2: 99% 96% 100% 98%   GEN- The patient is elderly WM appears in NAD, alert and oriented x 3 today.   HEENT: normocephalic, atraumatic; sclera clear, conjunctiva pink; hearing intact; oropharynx clear; neck supple, no JVP Lymph- no cervical lymphadenopathy Lungs- Clear to ausculation bilaterally, normal  work of breathing.  No wheezes, rales, rhonchi Heart- irregular rate and rhythm, bradycardic, no murmurs, rubs or gallops, PMI not laterally displaced GI- soft, non-tender, non-distended, bowel sounds present Extremities- no clubbing, cyanosis, or edema MS- no significant deformity age appropriate atrophy Skin- warm and dry, no rash or lesion Psych- euthymic mood, full affect Neuro- no gross deficits observed  Labs:   Lab Results  Component Value Date   WBC 3.9*  12/30/2015   HGB 11.2* 12/30/2015   HCT 31.7* 12/30/2015   MCV 85.7 12/30/2015   PLT 120* 12/30/2015    Recent Labs Lab 12/29/15 0934  12/30/15 0139  NA 131*  < > 137  K 3.9  < > 3.9  CL 94*  < > 101  CO2 25  --  26  BUN 64*  < > 50*  CREATININE 2.33*  < > 1.76*  CALCIUM 8.8*  --  8.6*  PROT 6.7  --   --   BILITOT 1.6*  --   --   ALKPHOS 55  --   --   ALT 21  --   --   AST 29  --   --   GLUCOSE 107*  < > 99  < > = values in this interval not displayed.    Radiology/Studies:  Dg Chest Portable 1 View 12/29/2015  CLINICAL DATA:  Bradycardia, weakness, cough EXAM: PORTABLE CHEST 1 VIEW COMPARISON:  03/14/2015 FINDINGS: Chronic cardiopericardial enlargement and aortic tortuosity. Stable upper mediastinal widening, likely ectatic vessels. Interstitial coarsening which is at the patient's baseline. No suspected pulmonary edema. No pneumonia, effusion, or pneumothorax. IMPRESSION: Chronic findings.  No evidence of acute cardiopulmonary disease. Electronically Signed   By: Monte Fantasia M.D.   On: 12/29/2015 09:57  Cardiac Cath: 2006 FINAL INTERPRETATION: 1. Mild nonobstructive atherosclerotic coronary artery disease. 2. Mildly impaired left ventricular systolic function.  ECHO 2015  Study Conclusions - Left ventricle: The cavity size was normal. Wall thickness was normal. Systolic function was moderately to severely reduced. The estimated ejection fraction was in the range of 30% to 35%. - Aortic valve:  There was mild regurgitation. - Mitral valve: There was mild regurgitation. - Right ventricle: The cavity size was mildly dilated. Systolic function was mildly reduced. - Right atrium: The atrium was mildly to moderately dilated.  NUC STUDY 2015 Exercise Capacity: Lexiscan with no exercise. BP Response: Normal blood pressure response. Clinical Symptoms: No significant symptoms noted. ECG Impression: No significant ST segment change suggestive of ischemia. Comparison with Prior Nuclear Study: No significant change from previous study Overall Impression: Low risk stress nuclear study a small region of apical lateral inferior scar without ischemia. LV Wall Motion: Not gated   EKG: AFib, 41bpm, RBBB, LAFB TELEMETRY: currently AFib 40-45bpm, overnight 28-30's    Assessment and Plan:  1. Symptomatic bradycardia     On Coreg at home, last dose 12/29/15 AM at home     Overnight HR 29-30's, this morning 35-45, while talking with me observed 45-50bpm     BP has been stable, patient tells me this morning he is feeling a little better but still tired/weak     Agree with cardiology, since some improvement in HE is observed this morning, continue to hold his BB and follow closely.  2. DCM     Appears well compensated, felt to be dehydrated upon admission     Pt report poor PO intake, especially with water while taking his diuretic  3. Noted cardiology note, failure to thrive     Pt reports he has just felt tired and weak, no appetite     Hopefully this improves as well  4. ARI, hyponatremia     Looks better s/p IVF  5. Viral illness     supportive care with primary team     afebrile      6. PAFib     CHADS2 Vasc is at least 6, chronically on Eliquis  SignedTommye Standard, PA-C 12/30/2015 6:54 AM  I have seen and examined this patient with Tommye Standard.  Agree with above, note added to reflect my findings.  On exam, bradycardic, irregular rhythm, no murmurs, lungs clear.   Presented with bradycardia on beta blocker.  Has paroxysmal atrial fibrillation and is in AF now.  Sawyer Kahan continue to let coreg wash out overnight.  HR today in 50s and feeling much better.  No changes necessary today, Byrd Rushlow continue to monitor.  Joshaua Epple M. Jasey Cortez MD 12/30/2015 1:03 PM

## 2015-12-31 MED ORDER — SERTRALINE HCL 50 MG PO TABS
150.0000 mg | ORAL_TABLET | Freq: Every day | ORAL | Status: DC
  Administered 2016-01-01 – 2016-01-03 (×3): 150 mg via ORAL
  Filled 2015-12-31 (×3): qty 1

## 2015-12-31 MED ORDER — SERTRALINE HCL 50 MG PO TABS: 150.0000 mg | ORAL_TABLET | Freq: Every day | ORAL | Status: DC

## 2015-12-31 MED ORDER — PRAMIPEXOLE DIHYDROCHLORIDE 0.25 MG PO TABS
0.1250 mg | ORAL_TABLET | Freq: Every day | ORAL | Status: DC
  Administered 2015-12-31 – 2016-01-02 (×3): 0.125 mg via ORAL
  Filled 2015-12-31 (×3): qty 1

## 2015-12-31 NOTE — Care Management Important Message (Signed)
Important Message  Patient Details  Name: Micheal Nicholson MRN: [id number] Date of Birth: Oct 18, 1920   Medicare Important Message Given:  Yes    Lemma Tetro P Crowley 12/31/2015, 2:23 PM

## 2015-12-31 NOTE — Progress Notes (Signed)
SUBJECTIVE: The patient is doing better again today.  At this time, he denies chest pain, shortness of breath, or any new concerns.  . ALPRAZolam  1 mg Oral Once  . apixaban  2.5 mg Oral BID  . dextromethorphan-guaiFENesin  1 tablet Oral BID  . dutasteride  0.5 mg Oral Daily   And  . tamsulosin  0.4 mg Oral Daily  . feeding supplement (ENSURE ENLIVE)  237 mL Oral BID BM  . pantoprazole  40 mg Oral Daily      OBJECTIVE: Physical Exam: Filed Vitals:   12/30/15 2336 12/31/15 0000 12/31/15 0356 12/31/15 0400  BP:  148/66 138/78 138/78  Pulse: 45 46 44 54  Temp: 98 F (36.7 C) 98 F (36.7 C) 97.4 F (36.3 C) 97.4 F (36.3 C)  TempSrc: Oral Oral Oral Oral  Resp: 24 14 29 25   Height:      Weight:   163 lb 2.3 oz (74 kg) 165 lb 2 oz (74.9 kg)  SpO2: 98% 100% 95% 95%    Intake/Output Summary (Last 24 hours) at 12/31/15 0655 Last data filed at 12/31/15 0500  Gross per 24 hour  Intake   1060 ml  Output   1875 ml  Net   -815 ml    Telemetry reveals Afib, 40-70's, at this time 50-60 bpm  GEN- The patient an elderly WM appears in NAD, alert and oriented x 3 today.   Head- normocephalic, atraumatic Eyes-  Sclera clear, conjunctiva pink Ears- hearing intact Oropharynx- clear Neck- supple, no JVP Lungs- Clear to ausculation bilaterally, normal work of breathing Heart- irregular rate and rhythm, no significant murmurs, no rubs or gallops GI- soft, NT, ND Extremities- no clubbing, cyanosis, or edema Skin- no rash or lesion Psych- euthymic mood, full affect Neuro- no gross deficits appreciated  LABS: Basic Metabolic Panel:  Recent Labs  12/29/15 0934 12/29/15 0944 12/29/15 1420 12/30/15 0139  NA 131* 130*  --  137  K 3.9 3.8  --  3.9  CL 94* 93*  --  101  CO2 25  --   --  26  GLUCOSE 107* 100*  --  99  BUN 64* 58*  --  50*  CREATININE 2.33* 2.20*  --  1.76*  CALCIUM 8.8*  --   --  8.6*  MG 2.5*  --  2.5*  --   PHOS  --   --  4.8*  --    Liver Function  Tests:  Recent Labs  12/29/15 0934  AST 29  ALT 21  ALKPHOS 55  BILITOT 1.6*  PROT 6.7  ALBUMIN 3.7   CBC:  Recent Labs  12/29/15 0934 12/29/15 0944 12/30/15 0139  WBC 4.9  --  3.9*  NEUTROABS 3.3  --   --   HGB 12.3* 13.3 11.2*  HCT 35.4* 39.0 31.7*  MCV 87.6  --  85.7  PLT 150  --  120*   Cardiac Enzymes:  Recent Labs  12/29/15 1420 12/29/15 1906 12/30/15 0135  TROPONINI 0.06* 0.06* 0.07*   Thyroid Function Tests:  Recent Labs  12/29/15 1420  TSH 0.542   RADIOLOGY: Dg Chest Portable 1 View 12/29/2015  CLINICAL DATA:  Bradycardia, weakness, cough EXAM: PORTABLE CHEST 1 VIEW COMPARISON:  03/14/2015 FINDINGS: Chronic cardiopericardial enlargement and aortic tortuosity. Stable upper mediastinal widening, likely ectatic vessels. Interstitial coarsening which is at the patient's baseline. No suspected pulmonary edema. No pneumonia, effusion, or pneumothorax. IMPRESSION: Chronic findings.  No evidence of acute cardiopulmonary disease.  Electronically Signed   By: Monte Fantasia M.D.   On: 12/29/2015 09:57    ASSESSMENT AND PLAN:  1. Symptomatic bradycardia  On Coreg at home, last dose 12/29/15 AM at home  HR continues to improve  BP has been stable  start to get patient up and OOB and monitor HR response  2. DCM  Appears well compensated, felt to be dehydrated upon admission  Pt report poor PO intake, especially with water while taking his diuretic  3. Noted cardiology note, failure to thrive  Pt reports he has just felt tired and weak, no appetite  Hopefully this improves as well  4. ARI, hyponatremia   Continues to improve  5. Viral illness  supportive care with primary team  afebrile   6. PAFib  CHADS2 Vasc is at least 6, chronically on Eliquis  Tommye Standard, PA-C 12/31/2015 6:55 AM   I have seen and examined this patient with Tommye Standard.  Agree with above, note added to reflect my findings.  On exam,  irregular rhythm, no murmurs, lungs clear.  Bradycardia improving as coreg washing out.  Shammond Arave continue to hold this and likely not restart.  Feeling well today with complaints only of restless legs.  If that continues, may require home Xanax for treatment.  Genelle Economou plan on ambulating today to see if HR increases.    Kateleen Encarnacion M. Hasna Stefanik MD 12/31/2015 8:13 AM

## 2015-12-31 NOTE — Discharge Instructions (Signed)

## 2015-12-31 NOTE — Progress Notes (Signed)
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 Patient reported 1 week duration of progressively worsening fatigue, poor oral intake, intermittent headaches in bilateral frontal areas.   In the ED initial blood pressure noted to be 80/47 with heart rate 33-45 bpm. Na 131, Cr 2.33, troponin 0.07. Cardiology consulted.  HPI/Subjective: Pt has walked some today.  He is resting comfortably in bed.  He states he is feeling better in general.  Denies current sob, n/v, or abdom pain.    Assessment/Plan:  Hypotension -likely related to dehydration and use of concomitant use of antihypertensives  -BP now markedly improved   Dehydration  -due to poor oral intake/diet along w/ ongoing diuretic use   Bradycardia -stopped BB - Cards following - HR remains < 50  Paroxysmal atrial fibrillation -continuing eliquis as per home medical regimen  Failure to thrive -15# weight loss over last 3-4 months   Dilated Cardiomyopathy / Chronic combined systolic and diastolic CHF, NYHA class 2 ([place]) -well compensated - pt was actually [facility] at admit - follow weights and Is/Os  Acute renal failure (baseline Cr~1.16) -stopped Lasix and lisinopril -Cr approaching baseline w/ hydration   Hyponatremia -resolved - due to hypovolemia   Anxiety state -continue Xanax per home regimen  Elevated troponin -likely demand ischemia in the setting of acute renal failure and dehydration - no angina - peaked at 0.07  GERD  -continue PPI  Code Status: FULL Family Communication: no family present at time of exam Disposition Plan:  SDU   Consultants: [facility] Cardiology   Procedures: none  Antibiotics: none  DVT prophylaxis: Eliquis   Objective: Blood pressure 133/61, pulse 38, temperature 97.6 F (36.4 C), temperature source Oral, resp. rate 20, height 5\' 10"  (1.778 m), weight 74.9 kg (165 lb 2 oz), SpO2 95 %.  Intake/Output Summary (Last 24 hours) at 12/31/15 1522 Last data filed at 12/31/15 1300  Gross per 24 hour  Intake    720 ml  Output   1550 ml  Net   -830 ml   Exam: General: No acute respiratory distress Lungs: Clear to auscultation bilaterally without wheezes or crackles Cardiovascular: bradycardic at 50bpm - no M  Abdomen: Nontender, nondistended, soft, bowel sounds positive, no rebound, no ascites, no appreciable mass Extremities: No significant cyanosis, clubbing, or edema bilateral lower extremities  Data Reviewed:  Basic Metabolic Panel:  Recent Labs Lab 12/29/15 0934 12/29/15 0944 12/29/15 1420 12/30/15 0139  NA 131* 130*  --  137  K 3.9 3.8  --  3.9  CL 94* 93*  --  101  CO2 25  --   --  26  GLUCOSE 107* 100*  --  99  BUN 64* 58*  --  50*  CREATININE 2.33* 2.20*  --  1.76*  CALCIUM 8.8*  --   --  8.6*  MG 2.5*  --  2.5*  --   PHOS  --   --  4.8*  --     CBC:  Recent Labs Lab 12/29/15 0934 12/29/15 0944 12/30/15 0139  WBC 4.9  --  3.9*  NEUTROABS 3.3  --   --  HGB 12.3* 13.3 11.2*  HCT 35.4* 39.0 31.7*  MCV 87.6  --  85.7  PLT 150  --  120*    Liver Function Tests:  Recent Labs Lab 12/29/15 0934  AST 29  ALT 21  ALKPHOS 55  BILITOT 1.6*  PROT 6.7  ALBUMIN 3.7   Cardiac Enzymes:  Recent Labs Lab 12/29/15 0934 12/29/15 1420 12/29/15 1906 12/30/15 0135  TROPONINI 0.07* 0.06* 0.06* 0.07*   Studies:   Recent x-ray studies have been reviewed in detail by the Attending Physician  Scheduled Meds:  Scheduled Meds: . ALPRAZolam  1 mg Oral Once  . apixaban  2.5 mg Oral BID  . dextromethorphan-guaiFENesin  1 tablet Oral BID  . dutasteride  0.5  mg Oral Daily   And  . tamsulosin  0.4 mg Oral Daily  . feeding supplement (ENSURE ENLIVE)  237 mL Oral BID BM  . pantoprazole  40 mg Oral Daily    Time spent on care of this patient: 35 mins   Carnell Beavers T , MD   [facility] Office  [phone] Pager - Text Page per Shea Evans as per below:  On-Call/Text Page:      [email or web address]      password [email or web address]  If 7PM-7AM, please contact night-coverage [email or web address] Password [email or web address] 12/31/2015, 3:22 PM   LOS: 2 days

## 2016-01-01 LAB — COMPREHENSIVE METABOLIC PANEL
ALBUMIN: 2.9 g/dL — AB (ref 3.5–5.0)
ALK PHOS: 47 U/L (ref 38–126)
ALT: 14 U/L — ABNORMAL LOW (ref 17–63)
AST: 19 U/L (ref 15–41)
Anion gap: 7 (ref 5–15)
BILIRUBIN TOTAL: 1.3 mg/dL — AB (ref 0.3–1.2)
BUN: 19 mg/dL (ref 6–20)
CALCIUM: 8.7 mg/dL — AB (ref 8.9–10.3)
CO2: 25 mmol/L (ref 22–32)
Chloride: 105 mmol/L (ref 101–111)
Creatinine, Ser: 1.25 mg/dL — ABNORMAL HIGH (ref 0.61–1.24)
GFR calc Af Amer: 55 mL/min — ABNORMAL LOW (ref >60)
GFR calc non Af Amer: 48 mL/min — ABNORMAL LOW (ref >60)
GLUCOSE: 100 mg/dL — AB (ref 65–99)
POTASSIUM: 4 mmol/L (ref 3.5–5.1)
Sodium: 137 mmol/L (ref 135–145)
TOTAL PROTEIN: 5.5 g/dL — AB (ref 6.5–8.1)

## 2016-01-01 LAB — CBC
HEMATOCRIT: 31.4 % — AB (ref 39.0–52.0)
Hemoglobin: 10.7 g/dL — ABNORMAL LOW (ref 13.0–17.0)
MCH: 29.7 pg (ref 26.0–34.0)
MCHC: 34.1 g/dL (ref 30.0–36.0)
MCV: 87.2 fL (ref 78.0–100.0)
Platelets: 132 10*3/uL — ABNORMAL LOW (ref 150–400)
RBC: 3.6 MIL/uL — ABNORMAL LOW (ref 4.22–5.81)
RDW: 12.7 % (ref 11.5–15.5)
WBC: 5.7 10*3/uL (ref 4.0–10.5)

## 2016-01-01 MED ORDER — LORATADINE 10 MG PO TABS
10.0000 mg | ORAL_TABLET | Freq: Every day | ORAL | Status: DC
  Administered 2016-01-01 – 2016-01-03 (×3): 10 mg via ORAL
  Filled 2016-01-01 (×3): qty 1

## 2016-01-01 MED ORDER — SODIUM CHLORIDE 0.9 % IJ SOLN
3.0000 mL | INTRAMUSCULAR | Status: DC | PRN
  Administered 2016-01-01 – 2016-01-02 (×2): 3 mL via INTRAVENOUS
  Filled 2016-01-01: qty 3

## 2016-01-01 MED ORDER — FLUTICASONE PROPIONATE 50 MCG/ACT NA SUSP
2.0000 | Freq: Two times a day (BID) | NASAL | Status: DC
  Administered 2016-01-01 – 2016-01-03 (×5): 2 via NASAL
  Filled 2016-01-01: qty 16

## 2016-01-01 MED ORDER — SODIUM CHLORIDE 0.9 % IJ SOLN
3.0000 mL | Freq: Two times a day (BID) | INTRAMUSCULAR | Status: DC
  Administered 2016-01-01 – 2016-01-02 (×3): 3 mL via INTRAVENOUS

## 2016-01-01 MED ORDER — APIXABAN 5 MG PO TABS
5.0000 mg | ORAL_TABLET | Freq: Two times a day (BID) | ORAL | Status: DC
  Administered 2016-01-01 – 2016-01-03 (×4): 5 mg via ORAL
  Filled 2016-01-01 (×4): qty 1

## 2016-01-01 MED ORDER — LISINOPRIL 2.5 MG PO TABS
2.5000 mg | ORAL_TABLET | Freq: Every day | ORAL | Status: DC
  Administered 2016-01-01: 2.5 mg via ORAL
  Filled 2016-01-01: qty 1

## 2016-01-01 NOTE — Progress Notes (Signed)
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 Patient reported 1 week duration of progressively worsening fatigue, poor oral intake, intermittent headaches in bilateral frontal areas.   In the ED initial blood pressure noted to be 80/47 with heart rate 33-45 bpm. Na 131, Cr 2.33, troponin 0.07. Cardiology consulted.  HPI/Subjective: The patient has no new complaints today.  He is in good spirits.  He denies chest pain fevers chills nausea or vomiting.  He does state he feels weak in general.  Assessment/Plan:  Hypotension -likely related to dehydration and use of concomitant use of antihypertensives  -now resolved, w/ some HTN recurring today   HTN -Cards has resumed ACE - follow BP   Dehydration  -due to poor oral intake/diet along w/ ongoing diuretic use - clinically resolved at this time   Bradycardia -stopped BB - Cards following - HR has improved to the 50-60 range   Permanent atrial fibrillation -continuing eliquis per Cardiology   Failure to thrive -15# weight loss over last 3-4 months - follow intake - PT/OT evals   Dilated Cardiomyopathy / Chronic combined systolic and diastolic CHF, NYHA class 2 ([place]) -well compensated - pt was actually [facility] at admit - follow weights and Is/Os - Cards resuming ACE today - no BB due to bradycardia   Acute renal failure (baseline Cr~1.16) -stopped Lasix and lisinopril at time of admit  -Cr approaching baseline w/ hydration - Cards resumed ACE today - follow crt   Hyponatremia -resolved - due to hypovolemia   Anxiety state -continue Xanax per home  regimen  Elevated troponin -likely demand ischemia in the setting of acute renal failure and dehydration - no angina - peaked at 0.07 - no further w/u planned   GERD  -continue PPI  Code Status: FULL Family Communication: no family present at time of exam Disposition Plan: SDU - begin PT/OT - probable transfer to tele in AM    Consultants: [facility] Cardiology   Procedures: none  Antibiotics: none  DVT prophylaxis: Eliquis   Objective: Blood pressure 132/59, pulse 63, temperature 97.5 F (36.4 C), temperature source Oral, resp. rate 13, height 5\' 10"  (1.778 m), weight 74 kg (163 lb 2.3 oz), SpO2 95 %.  Intake/Output Summary (Last 24 hours) at 01/01/16 1439 Last data filed at 01/01/16 1000  Gross per 24 hour  Intake    700 ml  Output    775 ml  Net    -75 ml   Exam: General: No acute respiratory distress - alert  Lungs: Clear to auscultation bilaterally  Cardiovascular: bradycardic at 50-60 bpm - no murmer  Abdomen: Nontender, nondistended, soft, bowel sounds positive, no rebound Extremities: No significant cyanosis, clubbing, or edema bilateral lower extremities  Data Reviewed:  Basic Metabolic Panel:  Recent Labs Lab 12/29/15 0934 12/29/15 0944 12/29/15 1420 12/30/15 0139 01/01/16 0237  NA 131* 130*  --  137 137  K 3.9 3.8  --  3.9 4.0  CL 94* 93*  --  101 105  CO2 25  --   --  26 25  GLUCOSE 107* 100*  --  99 100*  BUN  64* 58*  --  50* 19  CREATININE 2.33* 2.20*  --  1.76* 1.25*  CALCIUM 8.8*  --   --  8.6* 8.7*  MG 2.5*  --  2.5*  --   --   PHOS  --   --  4.8*  --   --     CBC:  Recent Labs Lab 12/29/15 0934 12/29/15 0944 12/30/15 0139 01/01/16 0237  WBC 4.9  --  3.9* 5.7  NEUTROABS 3.3  --   --   --   HGB 12.3* 13.3 11.2* 10.7*  HCT 35.4* 39.0 31.7* 31.4*  MCV 87.6  --  85.7 87.2  PLT 150  --  120* 132*    Liver Function Tests:  Recent Labs Lab 12/29/15 0934 01/01/16 0237  AST 29 19  ALT 21 14*  ALKPHOS 55 47  BILITOT 1.6*  1.3*  PROT 6.7 5.5*  ALBUMIN 3.7 2.9*   Cardiac Enzymes:  Recent Labs Lab 12/29/15 0934 12/29/15 1420 12/29/15 1906 12/30/15 0135  TROPONINI 0.07* 0.06* 0.06* 0.07*   Studies:   Recent x-ray studies have been reviewed in detail by the Attending Physician  Scheduled Meds:  Scheduled Meds: . apixaban  5 mg Oral BID  . dextromethorphan-guaiFENesin  1 tablet Oral BID  . dutasteride  0.5 mg Oral Daily   And  . tamsulosin  0.4 mg Oral Daily  . feeding supplement (ENSURE ENLIVE)  237 mL Oral BID BM  . fluticasone  2 spray Each Nare BID  . lisinopril  2.5 mg Oral Daily  . loratadine  10 mg Oral Daily  . pantoprazole  40 mg Oral Daily  . pramipexole  0.125 mg Oral QHS  . sertraline  150 mg Oral Daily  . sodium chloride  3 mL Intravenous Q12H    Time spent on care of this patient: 35 mins   Malarie Tappen T , MD   [facility] Office  [phone] Pager - Text Page per Shea Evans as per below:  On-Call/Text Page:      [email or web address]      password [email or web address]  If 7PM-7AM, please contact night-coverage [email or web address] Password [email or web address] 01/01/2016, 2:39 PM   LOS: 3 days

## 2016-01-01 NOTE — Progress Notes (Signed)
PROGRESS NOTE  Subjective:   82 yo with hx of atrial fib Presented with hypotension and bradycardia Denies CP or dyspnea  Objective:    Vital Signs:   Temp:  [97.6 F (36.4 C)-98.3 F (36.8 C)] 97.9 F (36.6 C) (01/07 0800) Pulse Rate:  [38-70] 51 (01/07 0800) Resp:  [16-22] 16 (01/07 0800) BP: (116-162)/(61-98) 162/90 mmHg (01/07 0800) SpO2:  [94 %-98 %] 97 % (01/07 0800) Weight:  [163 lb 2.3 oz (74 kg)] 163 lb 2.3 oz (74 kg) (01/07 0401)      24-hour weight change: Weight change: 0 lb (0 kg)  Weight trends: Filed Weights   12/31/15 0400 01/01/16 0400 01/01/16 0401  Weight: 165 lb 2 oz (74.9 kg) 163 lb 2.3 oz (74 kg) 163 lb 2.3 oz (74 kg)    Intake/Output:  01/06 0701 - 01/07 0700 In: 600 [P.O.:600] Out: 900 [Urine:900] Total I/O In: 240 [P.O.:240] Out: 375 [Urine:375]   Physical Exam: BP 162/90 mmHg  Pulse 51  Temp(Src) 97.9 F (36.6 C) (Oral)  Resp 16  Ht 5\' 10"  (1.778 m)  Wt 163 lb 2.3 oz (74 kg)  BMI 23.41 kg/m2  SpO2 97%  Wt Readings from Last 3 Encounters:  01/01/16 163 lb 2.3 oz (74 kg)  12/29/15 171 lb 4 oz (77.678 kg)  12/23/15 172 lb 4 oz (78.132 kg)    General: WD WN NAD  Head: Normal        Neck:  supple  Lungs:    CTA  Heart:  Irreg. Irreg. Brady   Abdomen:  Soft, non-tender, non-distended    Extremities: No edema    Neurologic: grossly intact.   Psych: Normal     Labs: BMET:  Recent Labs  12/29/15 1420 12/30/15 0139 01/01/16 0237  NA  --  137 137  K  --  3.9 4.0  CL  --  101 105  CO2  --  26 25  GLUCOSE  --  99 100*  BUN  --  50* 19  CREATININE  --  1.76* 1.25*  CALCIUM  --  8.6* 8.7*  MG 2.5*  --   --   PHOS 4.8*  --   --     Liver function tests:  Recent Labs  01/01/16 0237  AST 19  ALT 14*  ALKPHOS 47  BILITOT 1.3*  PROT 5.5*  ALBUMIN 2.9*    CBC:  Recent Labs  12/30/15 0139 01/01/16 0237  WBC 3.9* 5.7  HGB 11.2* 10.7*  HCT 31.7* 31.4*  MCV 85.7 87.2  PLT 120* 132*     Cardiac Enzymes:  Recent Labs  12/29/15 1420 12/29/15 1906 12/30/15 0135  TROPONINI 0.06* 0.06* 0.07*    Recent Labs  12/29/15 1420  TSH 0.542      Medications:    Infusions:    Scheduled Medications: . apixaban  2.5 mg Oral BID  . dextromethorphan-guaiFENesin  1 tablet Oral BID  . dutasteride  0.5 mg Oral Daily   And  . tamsulosin  0.4 mg Oral Daily  . feeding supplement (ENSURE ENLIVE)  237 mL Oral BID BM  . fluticasone  2 spray Each Nare BID  . loratadine  10 mg Oral Daily  . pantoprazole  40 mg Oral Daily  . pramipexole  0.125 mg Oral QHS  . sertraline  150 mg Oral Daily  . sodium chloride  3 mL Intravenous Q12H    Assessment/ Plan:    1. Atrial fib-Patient remains in  permanent atrial fibrillation. Coreg has been discontinued. Heart rate remains mildly bradycardic at times but no symptoms. We will continue off of AV nodal blocking agents. Continue apixaban but increase to 5 mg BID.  2. Bradycardia:   Continue to hold beta blocker.  3. Failure to thrive:  Per IM  4. History of dilated cardiomyopathy-we will continue off of beta blocker given bradycardia. Renal function has improved. Resume lisinopril 2.5 mg daily and follow. Will likely need lower dose diuretic at discharge.  5. Hypertension-blood pressure now trending up. Resume lisinopril and increase as tolerated.  6. Acute renal failure-improving. Follow renal function.  Disposition:  Length of Stay: [place], MD, [facility] 01/01/2016, 11:20 AM

## 2016-01-01 NOTE — Plan of Care (Signed)
Problem: Physical Regulation: Goal: Ability to maintain clinical measurements within normal limits will improve Outcome: Progressing HR 30's-50's during sleep; increases to 60's-70's when alert.  Problem: Nutrition: Goal: Adequate nutrition will be maintained Outcome: Progressing Poor appetite - slowly increasing % meals eaten.

## 2016-01-02 LAB — BASIC METABOLIC PANEL
ANION GAP: 11 (ref 5–15)
BUN: 21 mg/dL — ABNORMAL HIGH (ref 6–20)
CHLORIDE: 102 mmol/L (ref 101–111)
CO2: 23 mmol/L (ref 22–32)
CREATININE: 1.16 mg/dL (ref 0.61–1.24)
Calcium: 8.7 mg/dL — ABNORMAL LOW (ref 8.9–10.3)
GFR calc non Af Amer: 52 mL/min — ABNORMAL LOW (ref >60)
GLUCOSE: 96 mg/dL (ref 65–99)
POTASSIUM: 3.8 mmol/L (ref 3.5–5.1)
Sodium: 136 mmol/L (ref 135–145)

## 2016-01-02 MED ORDER — LISINOPRIL 5 MG PO TABS
5.0000 mg | ORAL_TABLET | Freq: Every day | ORAL | Status: DC
  Administered 2016-01-02 – 2016-01-03 (×2): 5 mg via ORAL
  Filled 2016-01-02 (×2): qty 1

## 2016-01-02 NOTE — Evaluation (Signed)
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 Patient reported 1 week duration of progressively worsening fatigue, poor oral intake, intermittent headaches in bilateral frontal areas.   Clinical Impression  Pt admitted with above diagnosis. Pt currently with functional limitations due to the deficits listed below (see PT Problem List). Pt ambulated 250' part with RW and part with no AD, safer with AD, min-guard A given. HR 76 bpm and O2 sats in 90's. Generalized weakness and quick fatigue noted with mobility.  Pt will benefit from skilled PT to increase their independence and safety with mobility to allow discharge to the venue listed below.      Follow Up Recommendations Home health PT    Equipment Recommendations  None recommended by PT    Recommendations for Other Services       Precautions / Restrictions Precautions Precautions: None Restrictions Weight Bearing Restrictions: No      Mobility  Bed Mobility Overal bed mobility: Modified Independent                Transfers Overall transfer level: Needs assistance Equipment used: None Transfers: Sit to/from Stand;Stand Pivot Transfers Sit to Stand: Supervision Stand pivot transfers: Min guard       General transfer comment: pt stands safely from bed and chair and performed SPT to and from recliner but one time was off balance and needed min A for safe sitting  Ambulation/Gait Ambulation/Gait assistance: Min guard Ambulation Distance (Feet): 250 Feet Assistive device: Rolling walker (2 wheeled);None Gait Pattern/deviations: Step-through pattern;Trunk  flexed Gait velocity: WFL Gait velocity interpretation: at or above normal speed for age/gender General Gait Details: pt began ambulation without RW but was reaching for stable surfaces and fwd flexed, was given RW and posture much better as well as ability to ambulate with only supervision. After he had ambulated 150', he felt "warmed up" and was able to continue last 100' without AD and min-guard A.  Stairs            Wheelchair Mobility    Modified Rankin (Stroke Patients Only)       Balance Overall balance assessment: Needs assistance Sitting-balance support: No upper extremity supported Sitting balance-Leahy Scale: Good     Standing balance support: No upper extremity supported Standing balance-Leahy Scale: Good Standing balance comment: can accept expected challenges, limitations evident with unexpected challenges and decreased ability to correct for balance loss                             Pertinent Vitals/Pain Pain Assessment: No/denies pain  See gait    Home Living Family/patient expects to be discharged to:: Private residence Living Arrangements: Children Available Help at Discharge: Family Type of Home: House Home Access: Level entry     Home Layout: One level Home Equipment: Environmental consultant - 2 wheels;Cane - single point Additional Comments: pt loves with his son who is disabled and morbidly obese. Son cannot help him, he gives son supervision and helps out as he can    Prior Function Level of Independence: Independent         Comments:  has RW but was not using it, still drives, still cares for self     Hand Dominance        Extremity/Trunk Assessment   Upper Extremity Assessment: Defer to OT evaluation           Lower Extremity Assessment: Generalized weakness      Cervical / Trunk Assessment: Normal  Communication   Communication: No difficulties  Cognition Arousal/Alertness: Awake/alert Behavior During Therapy: WFL for  tasks assessed/performed Overall Cognitive Status: Within Functional Limits for tasks assessed                      General Comments General comments (skin integrity, edema, etc.): HR 76 after ambulation, O2 sats stable in 90's on RA. Pt does tend to reach for things when not using an AD, recommended that he use RW for now as he gets stronger    Exercises        Assessment/Plan    PT Assessment Patient needs continued PT services  PT Diagnosis Difficulty walking;Generalized weakness   PT Problem List Decreased strength;Decreased activity tolerance;Decreased mobility;Decreased balance;Decreased knowledge of use of DME;Decreased knowledge of precautions;Cardiopulmonary status limiting activity  PT Treatment Interventions DME instruction;Gait training;Functional mobility training;Therapeutic activities;Therapeutic exercise;Balance training;Patient/family education   PT Goals (Current goals can be found in the Care Plan section) Acute Rehab PT Goals Patient Stated Goal: return home PT Goal Formulation: With patient Time For Goal Achievement: 01/16/16 Potential to Achieve Goals: Good    Frequency Min 3X/week   Barriers to discharge Decreased caregiver support      Co-evaluation               End of Session Equipment Utilized During Treatment: Gait belt Activity Tolerance: Patient tolerated treatment well Patient left: in chair;with call bell/phone within reach Nurse Communication: Mobility status         Time: [id number] PT Time Calculation (min) (ACUTE ONLY): 15 min   Charges:   PT Evaluation $PT Eval Moderate Complexity: 1 Procedure     PT G Codes:       Leighton Roach, PT  Acute Rehab Services  [phone]  Leighton Roach 01/02/2016, 1:53 PM

## 2016-01-02 NOTE — Progress Notes (Signed)
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 Patient reported 1 week duration of progressively worsening fatigue, poor oral intake, intermittent headaches in bilateral frontal areas.   In the ED initial blood pressure noted to be 80/47 with heart rate 33-45 bpm. Na 131, Cr 2.33, troponin 0.07. Cardiology consulted.  HPI/Subjective: The patient is resting comfortably in bed.  He denies chest pain shortness of breath lightheadedness or headache.  He is anxious to get up and get moving around.  He tells me he lives with his 33 year old son who is disabled and unable to care for himself.  Assessment/Plan:  Hypotension - resolved -likely related to dehydration and use of concomitant use of antihypertensives   HTN -Cards has resumed ACE - BP reasonably controlled at this time - avoid overcorrection in this gentleman of advanced age   Dehydration - resolved  -due to poor oral intake/diet along w/ ongoing diuretic use   Bradycardia -stopped BB - Cards following - HR has been dipping into the 40s again today - pt is currently asymptomatic  Permanent atrial fibrillation -continuing eliquis per Cardiology   Failure to thrive -15# weight loss over last 3-4 months - follow intake - PT/OT evals -?if this is due to bradycardia and hypotension leading to lethargy - TSH normal - follow   Dilated Cardiomyopathy / Chronic combined systolic and diastolic CHF, NYHA class 2 (HCC) -well compensated - pt was actually [facility] at admit - follow weights and Is/Os - Cards resumed ACE - no BB due to bradycardia - admit weight  74.5kg - currently 76.6kg which appears to be a good dry weight for him   Acute renal failure (baseline Cr~1.16) -stopped Lasix and lisinopril at time of admit  -Cr has returned to baseline - Cards resumed ACE - follow crt trend   Hyponatremia -resolved - due to hypovolemia   Anxiety state -continue Xanax per home regimen - well controlled   Elevated troponin -likely demand ischemia in the setting of acute renal failure and dehydration - no angina - peaked at 0.07 - no further w/u planned   GERD  -continue PPI  Code Status: FULL Family Communication: no family present at time of exam Disposition Plan: begin PT/OT - transfer to tele bed - pt hopeful for d/c home, but I suspect he may need CIR    Consultants: [facility] Cardiology   Procedures: none  Antibiotics: none  DVT prophylaxis: Eliquis   Objective: Blood pressure 133/60, pulse 52, temperature 97.5 F (36.4 C), temperature source Oral, resp. rate 18, height 5\' 10"  (1.778 m), weight 76.6 kg (168 lb 14 oz), SpO2 97 %.  Intake/Output Summary (Last 24 hours) at 01/02/16 1142 Last data filed at 01/01/16 2300  Gross per 24 hour  Intake    570 ml  Output    450 ml  Net    120 ml   Exam: General: No acute respiratory distress - alert and pleasant  Lungs: Clear to auscultation bilaterally - no wheeze or crackles  Cardiovascular: bradycardic at 40-50 bpm - no murmer  Abdomen: Nontender, nondistended, soft, bowel sounds positive, no rebound Extremities: No significant  cyanosis, clubbing, edema bilateral lower extremities  Data Reviewed:  Basic Metabolic Panel:  Recent Labs Lab 12/29/15 0934 12/29/15 0944 12/29/15 1420 12/30/15 0139 01/01/16 0237 01/02/16 0308  NA 131* 130*  --  137 137 136  K 3.9 3.8  --  3.9 4.0 3.8  CL 94* 93*  --  101 105 102  CO2 25  --   --  26 25 23   GLUCOSE 107* 100*  --  99 100* 96  BUN 64* 58*  --  50* 19 21*  CREATININE 2.33* 2.20*  --  1.76* 1.25* 1.16  CALCIUM 8.8*  --   --   8.6* 8.7* 8.7*  MG 2.5*  --  2.5*  --   --   --   PHOS  --   --  4.8*  --   --   --     CBC:  Recent Labs Lab 12/29/15 0934 12/29/15 0944 12/30/15 0139 01/01/16 0237  WBC 4.9  --  3.9* 5.7  NEUTROABS 3.3  --   --   --   HGB 12.3* 13.3 11.2* 10.7*  HCT 35.4* 39.0 31.7* 31.4*  MCV 87.6  --  85.7 87.2  PLT 150  --  120* 132*    Liver Function Tests:  Recent Labs Lab 12/29/15 0934 01/01/16 0237  AST 29 19  ALT 21 14*  ALKPHOS 55 47  BILITOT 1.6* 1.3*  PROT 6.7 5.5*  ALBUMIN 3.7 2.9*   Cardiac Enzymes:  Recent Labs Lab 12/29/15 0934 12/29/15 1420 12/29/15 1906 12/30/15 0135  TROPONINI 0.07* 0.06* 0.06* 0.07*   Studies:   Recent x-ray studies have been reviewed in detail by the Attending Physician  Scheduled Meds:  Scheduled Meds: . apixaban  5 mg Oral BID  . dextromethorphan-guaiFENesin  1 tablet Oral BID  . dutasteride  0.5 mg Oral Daily   And  . tamsulosin  0.4 mg Oral Daily  . feeding supplement (ENSURE ENLIVE)  237 mL Oral BID BM  . fluticasone  2 spray Each Nare BID  . [START ON 01/03/2016] lisinopril  5 mg Oral Daily  . loratadine  10 mg Oral Daily  . pantoprazole  40 mg Oral Daily  . pramipexole  0.125 mg Oral QHS  . sertraline  150 mg Oral Daily  . sodium chloride  3 mL Intravenous Q12H    Time spent on care of this patient: 25 mins   [facility] , MD   [facility] Office  [phone] Pager - Text Page per Shea Evans as per below:  On-Call/Text Page:      [email or web address]      password [email or web address]  If 7PM-7AM, please contact night-coverage [email or web address] Password [email or web address] 01/02/2016, 11:42 AM   LOS: 4 days

## 2016-01-02 NOTE — Progress Notes (Signed)
PROGRESS NOTE  Subjective:   82 yo with hx of atrial fib Presented with hypotension and bradycardia Denies CP or dyspnea  Objective:    Vital Signs:   Temp:  [97.5 F (36.4 C)-97.9 F (36.6 C)] 97.5 F (36.4 C) (01/08 0800) Pulse Rate:  [35-70] 52 (01/08 0800) Resp:  [13-22] 18 (01/08 0800) BP: (132-146)/(59-95) 133/60 mmHg (01/08 0800) SpO2:  [94 %-99 %] 97 % (01/08 0800) Weight:  [168 lb 14 oz (76.6 kg)] 168 lb 14 oz (76.6 kg) (01/08 0524)  Last BM Date: 01/01/16   24-hour weight change: Weight change: 5 lb 11.7 oz (2.6 kg)  Weight trends: Filed Weights   01/01/16 0400 01/01/16 0401 01/02/16 0524  Weight: 163 lb 2.3 oz (74 kg) 163 lb 2.3 oz (74 kg) 168 lb 14 oz (76.6 kg)    Intake/Output:  01/07 0701 - 01/08 0700 In: 1030 [P.O.:1030] Out: 825 [Urine:825]     Physical Exam: BP 133/60 mmHg  Pulse 52  Temp(Src) 97.5 F (36.4 C) (Oral)  Resp 18  Ht 5\' 10"  (1.778 m)  Wt 168 lb 14 oz (76.6 kg)  BMI 24.23 kg/m2  SpO2 97%  Wt Readings from Last 3 Encounters:  01/02/16 168 lb 14 oz (76.6 kg)  12/29/15 171 lb 4 oz (77.678 kg)  12/23/15 172 lb 4 oz (78.132 kg)    General: WD WN NAD  Head: Normal        Neck:  supple  Lungs:    CTA  Heart:  Irreg. Irreg. Brady   Abdomen:  Soft, non-tender, non-distended    Extremities: No edema    Neurologic: grossly intact.   Psych: Normal     Labs: BMET:  Recent Labs  01/01/16 0237 01/02/16 0308  NA 137 136  K 4.0 3.8  CL 105 102  CO2 25 23  GLUCOSE 100* 96  BUN 19 21*  CREATININE 1.25* 1.16  CALCIUM 8.7* 8.7*    Liver function tests:  Recent Labs  01/01/16 0237  AST 19  ALT 14*  ALKPHOS 47  BILITOT 1.3*  PROT 5.5*  ALBUMIN 2.9*    CBC:  Recent Labs  01/01/16 0237  WBC 5.7  HGB 10.7*  HCT 31.4*  MCV 87.2  PLT 132*    Cardiac Enzymes: No results for input(s): CKTOTAL, CKMB, TROPONINI in the last 72 hours. No results for input(s): TSH, T4TOTAL, T3FREE, THYROIDAB in the  last 72 hours.  Invalid input(s): FREET3    Medications:    Infusions:    Scheduled Medications: . apixaban  5 mg Oral BID  . dextromethorphan-guaiFENesin  1 tablet Oral BID  . dutasteride  0.5 mg Oral Daily   And  . tamsulosin  0.4 mg Oral Daily  . feeding supplement (ENSURE ENLIVE)  237 mL Oral BID BM  . fluticasone  2 spray Each Nare BID  . lisinopril  2.5 mg Oral Daily  . loratadine  10 mg Oral Daily  . pantoprazole  40 mg Oral Daily  . pramipexole  0.125 mg Oral QHS  . sertraline  150 mg Oral Daily  . sodium chloride  3 mL Intravenous Q12H    Assessment/ Plan:    1. Atrial fib-Patient remains in permanent atrial fibrillation. Coreg has been discontinued. Heart rate remains mildly bradycardic at times but no symptoms. We will continue off of AV nodal blocking agents. Continue apixaban but increase to 5 mg BID.  2. Bradycardia:   Continue to hold beta blocker.  3. Failure to thrive:  Per IM  4. History of dilated cardiomyopathy-we will continue off of beta blocker given bradycardia. Renal function has improved. Increase lisinopril to 5 mg daily. Will likely need lower dose diuretic at discharge.  5. Hypertension-blood pressure now trending up. Increase lisinopril.  6. Acute renal failure-improved.  Disposition:  Length of Stay: [place], MD, [facility] 01/02/2016, 10:12 AM

## 2016-01-03 ENCOUNTER — Other Ambulatory Visit: Payer: Self-pay | Admitting: *Deleted

## 2016-01-03 DIAGNOSIS — I4891 Unspecified atrial fibrillation: Secondary | ICD-10-CM

## 2016-01-03 DIAGNOSIS — I482 Chronic atrial fibrillation: Secondary | ICD-10-CM

## 2016-01-03 DIAGNOSIS — I4819 Other persistent atrial fibrillation: Secondary | ICD-10-CM | POA: Insufficient documentation

## 2016-01-03 LAB — BASIC METABOLIC PANEL
ANION GAP: 10 (ref 5–15)
BUN: 18 mg/dL (ref 6–20)
CALCIUM: 8.6 mg/dL — AB (ref 8.9–10.3)
CO2: 24 mmol/L (ref 22–32)
CREATININE: 1.23 mg/dL (ref 0.61–1.24)
Chloride: 104 mmol/L (ref 101–111)
GFR calc non Af Amer: 49 mL/min — ABNORMAL LOW (ref >60)
GFR, EST AFRICAN AMERICAN: 57 mL/min — AB (ref >60)
Glucose, Bld: 97 mg/dL (ref 65–99)
Potassium: 3.8 mmol/L (ref 3.5–5.1)
SODIUM: 138 mmol/L (ref 135–145)

## 2016-01-03 LAB — CBC
HCT: 31.2 % — ABNORMAL LOW (ref 39.0–52.0)
Hemoglobin: 10.7 g/dL — ABNORMAL LOW (ref 13.0–17.0)
MCH: 29.8 pg (ref 26.0–34.0)
MCHC: 34.3 g/dL (ref 30.0–36.0)
MCV: 86.9 fL (ref 78.0–100.0)
PLATELETS: 165 10*3/uL (ref 150–400)
RBC: 3.59 MIL/uL — AB (ref 4.22–5.81)
RDW: 12.8 % (ref 11.5–15.5)
WBC: 4.7 10*3/uL (ref 4.0–10.5)

## 2016-01-03 MED ORDER — FUROSEMIDE 40 MG PO TABS: 40.0000 mg | ORAL_TABLET | Freq: Every day | ORAL | Status: DC

## 2016-01-03 MED ORDER — CARVEDILOL 3.125 MG PO TABS: 3.1250 mg | ORAL_TABLET | Freq: Two times a day (BID) | ORAL | Status: DC

## 2016-01-03 MED ORDER — BISACODYL 5 MG PO TBEC
10.0000 mg | DELAYED_RELEASE_TABLET | Freq: Every day | ORAL | Status: DC | PRN
  Administered 2016-01-03: 10 mg via ORAL
  Filled 2016-01-03: qty 2

## 2016-01-03 MED ORDER — DM-GUAIFENESIN ER 30-600 MG PO TB12: 1.0000 | ORAL_TABLET | Freq: Two times a day (BID) | ORAL | Status: DC | PRN

## 2016-01-03 MED ORDER — APIXABAN 5 MG PO TABS: 5.0000 mg | ORAL_TABLET | Freq: Two times a day (BID) | ORAL | Status: DC

## 2016-01-03 MED ORDER — LISINOPRIL 5 MG PO TABS: 5.0000 mg | ORAL_TABLET | Freq: Every day | ORAL | Status: DC

## 2016-01-03 NOTE — Evaluation (Signed)
Occupational Therapy Evaluation Patient Details Name: Micheal Nicholson MRN: [id number] DOB: 01/18/1920 Today's Date: 01/03/2016    History of Present Illness 82 y.o. M [employer] Veteran w/ a Hx of Paroxysmal Atrial Fib and Trifascicular block, Chronic combined Systolic and Diastolic CHF, BPH, anxiety, hypertension, and pt reports history of stroke, who was sent to [facility] ED from PCP office for evaluation of hypotension at 88/50 and bradycardia with heart rate between 30s and 40s. Patient reported 1 week duration of progressively worsening fatigue, poor oral intake, intermittent headaches in bilateral frontal areas.    Clinical Impression   Pt admitted with above. Pt independent with ADLs, PTA. Feel pt will benefit from acute OT to increase independence, strength, and activity tolerance, prior to d/c.    Follow Up Recommendations  No OT follow up;Supervision - Intermittent    Equipment Recommendations  None recommended by OT    Recommendations for Other Services       Precautions / Restrictions Precautions Precautions: Fall Restrictions Weight Bearing Restrictions: No      Mobility Bed Mobility               General bed mobility comments: pt sitting EOB  Transfers Overall transfer level: Needs assistance Equipment used: None Transfers: Sit to/from Stand Sit to Stand: Supervision              Balance    Pt able to pick item off of floor without LOB and able to simulate functional task with UE supported and in single leg stance with Supervision assist, however pt unsteady without UE support.                                         ADL Overall ADL's : Needs assistance/impaired             Lower Body Bathing: Min guard (standing; supervision-Min guard)       Lower Body Dressing: Set up;Supervision/safety;Sit to/from stand   Toilet Transfer: Min guard;Ambulation (sit to stand from bed)           Functional mobility during ADLs:  Min guard General ADL Comments: Educated on safety such as sitting for LB ADLs, safe footwear, rugs/items on floor, and spoke with him about using his shower chair. Pt reports he has history of stroke and has had decreased short term memory, so educated on strategies he can use to help with this.     Vision     Perception     Praxis      Pertinent Vitals/Pain Pain Assessment: No/denies pain     Hand Dominance     Extremity/Trunk Assessment Upper Extremity Assessment Upper Extremity Assessment: Generalized weakness (weak in shoulder flexors and weak grip strength)   Lower Extremity Assessment Lower Extremity Assessment: Defer to PT evaluation       Communication Communication Communication: HOH   Cognition Arousal/Alertness: Awake/alert Behavior During Therapy: WFL for tasks assessed/performed Overall Cognitive Status:  (reports decreased short term memory at baseline since stroke)                     General Comments       Exercises Exercises: Other exercises Other Exercises Other Exercises: pt reports he has weight at home and OT/pt discussed exercises briefly for UEs.   Shoulder Instructions      Home Living Family/patient expects to be discharged to::  Private residence Living Arrangements: Children Available Help at Discharge: Family Type of Home: House Home Access: Level entry     [place]: One level     Bathroom Shower/Tub: Occupational psychologist: [place]: Environmental consultant - 2 wheels;Cane - single point;Shower seat   Additional Comments: pt lives with his son who is disabled and morbidly obese. Son cannot help him, he gives son supervision and helps out as he can; has other son that can check on him      Prior Functioning/Environment Level of Independence: Independent        Comments: has RW but was not using it, still drives, still cares for self    OT Diagnosis: Generalized weakness   OT Problem List:  Decreased strength;Decreased cognition;Impaired balance (sitting and/or standing);Decreased activity tolerance;Decreased knowledge of use of DME or AE   OT Treatment/Interventions: Self-care/ADL training;DME and/or AE instruction;Therapeutic activities;Patient/family education;Balance training;Cognitive remediation/compensation;Therapeutic exercise;Energy conservation    OT Goals(Current goals can be found in the care plan section) Acute Rehab OT Goals Patient Stated Goal: to walk OT Goal Formulation: With patient Time For Goal Achievement: 01/10/16 Potential to Achieve Goals: Good ADL Goals Pt Will Perform Lower Body Dressing: with modified independence;sit to/from stand (including gathering items) Pt Will Transfer to Toilet: with modified independence;ambulating;regular height toilet Pt Will Perform Tub/Shower Transfer: Shower transfer;ambulating;shower seat;with set-up  OT Frequency: Min 2X/week   Barriers to D/C:            Co-evaluation              End of Session Equipment Utilized During Treatment: Gait belt  Activity Tolerance: Patient tolerated treatment well Patient left: in bed;with call bell/phone within reach;with bed alarm set   Time: [id number] OT Time Calculation (min): 18 min Charges:  OT General Charges $OT Visit: 1 Procedure OT Evaluation $OT Eval Moderate Complexity: 1 Procedure G-CodesBenito Mccreedy OTR/L [id number] 01/03/2016, 9:26 AM

## 2016-01-03 NOTE — Progress Notes (Addendum)
PROGRESS NOTE  Micheal Nicholson [id number] DOB: [birth year] DOA: 12/29/2015 PCP: [name], MD  Chart reviewed. Discussed with Dr. Thereasa Solo  Admit HPI / Brief Narrative: 82 yo M [employer] Veteran w/ a Hx Paroxysmal Atrial Fib and Trifascicular block, Chronic combined Systolic and Diastolic CHF who was sent to [facility] ED from PCP office for evaluation of hypotension at 88/50 and bradycardia with heart rate between 30s and 40s. Patient reported 1 week duration of progressively worsening fatigue, poor oral intake, intermittent headaches in bilateral frontal areas.   In the ED initial blood pressure noted to be 80/47 with heart rate 33-45 bpm. Na 131, Cr 2.33, troponin 0.07. Cardiology consulted.  HPI/Subjective: No dyspnea. No dizziness. Walked 250 feet with physical therapy. Per nursing notes, ate 100% of his breakfast.  Assessment/Plan:  Hypotension - resolved -likely related to dehydration and use of concomitant use of antihypertensives   HTN -Cards has resumed ACE - BP reasonably controlled at this time   Dehydration - resolved  -due to poor oral intake/diet along w/ ongoing diuretic use. Intake has been good here.  Bradycardia -stopped BB - Cards following - telemetry shows heart rates into the 30s and 40s. Asymptomatic.  Permanent atrial fibrillation -continuing eliquis per Cardiology   Failure to thrive Intake good here.  Dilated Cardiomyopathy / Chronic combined systolic and diastolic CHF, NYHA class 2 (HCC) Appears euvolemic  Acute renal failure (baseline Cr~1.16) Resolved  Hyponatremia -resolved - due to hypovolemia   Anxiety state -continue Xanax per home regimen - well controlled   Elevated troponin -likely demand ischemia in the setting of acute renal failure and dehydration - no angina - peaked at 0.07 - no further w/u planned   GERD  -continue PPI  Code Status: FULL Family Communication: no family present at time of exam Disposition  Plan: Home with PT RN when okay with cardiology.  Consultants: [facility] Cardiology   Procedures: none  Antibiotics: none  DVT prophylaxis: Eliquis   Objective: Blood pressure 149/73, pulse 52, temperature 97.6 F (36.4 C), temperature source Oral, resp. rate 20, height 5\' 10"  (1.778 m), weight 76.6 kg (168 lb 14 oz), SpO2 93 %.  Intake/Output Summary (Last 24 hours) at 01/03/16 1046 Last data filed at 01/03/16 0800  Gross per 24 hour  Intake    680 ml  Output    500 ml  Net    180 ml   Exam: General: Comfortable lying flat. Alert and oriented. Lungs: Breathing nonlabored. Occasional rales at bases Cardiovascular: Slow, irregular Abdomen: Nontender, nondistended, soft, bowel sounds positive, no rebound Extremities: No significant cyanosis, clubbing, edema bilateral lower extremities  Data Reviewed:  Basic Metabolic Panel:  Recent Labs Lab 12/29/15 0934 12/29/15 0944 12/29/15 1420 12/30/15 0139 01/01/16 0237 01/02/16 0308 01/03/16 0538  NA 131* 130*  --  137 137 136 138  K 3.9 3.8  --  3.9 4.0 3.8 3.8  CL 94* 93*  --  101 105 102 104  CO2 25  --   --  26 25 23 24   GLUCOSE 107* 100*  --  99 100* 96 97  BUN 64* 58*  --  50* 19 21* 18  CREATININE 2.33* 2.20*  --  1.76* 1.25* 1.16 1.23  CALCIUM 8.8*  --   --  8.6* 8.7* 8.7* 8.6*  MG 2.5*  --  2.5*  --   --   --   --   PHOS  --   --  4.8*  --   --   --   --  CBC:  Recent Labs Lab 12/29/15 0934 12/29/15 0944 12/30/15 0139 01/01/16 0237 01/03/16 0538  WBC 4.9  --  3.9* 5.7 4.7  NEUTROABS 3.3  --   --   --   --   HGB 12.3* 13.3 11.2* 10.7* 10.7*  HCT 35.4* 39.0 31.7* 31.4* 31.2*  MCV 87.6  --  85.7 87.2 86.9  PLT 150  --  120* 132* 165    Liver Function Tests:  Recent Labs Lab 12/29/15 0934 01/01/16 0237  AST 29 19  ALT 21 14*  ALKPHOS 55 47  BILITOT 1.6* 1.3*  PROT 6.7 5.5*  ALBUMIN 3.7 2.9*   Cardiac Enzymes:  Recent Labs Lab 12/29/15 0934 12/29/15 1420 12/29/15 1906 12/30/15 0135   TROPONINI 0.07* 0.06* 0.06* 0.07*   Studies:   Recent x-ray studies have been reviewed in detail by the Attending Physician  Scheduled Meds:  Scheduled Meds: . apixaban  5 mg Oral BID  . dextromethorphan-guaiFENesin  1 tablet Oral BID  . dutasteride  0.5 mg Oral Daily   And  . tamsulosin  0.4 mg Oral Daily  . feeding supplement (ENSURE ENLIVE)  237 mL Oral BID BM  . fluticasone  2 spray Each Nare BID  . lisinopril  5 mg Oral Daily  . loratadine  10 mg Oral Daily  . pantoprazole  40 mg Oral Daily  . pramipexole  0.125 mg Oral QHS  . sertraline  150 mg Oral Daily    Time spent on care of this patient: 25 mins   Delfina Redwood , MD   [facility]  [email or web address] Password [email or web address] 01/03/2016, 10:46 AM   LOS: 5 days

## 2016-01-03 NOTE — Progress Notes (Signed)
UR Completed. Divya Munshi, RN, BSN.  [phone] 

## 2016-01-03 NOTE — Care Management Note (Addendum)
Case Management Note  Patient Details  Name: Micheal Nicholson MRN: [id number] Date of Birth: [birth year]  Subjective/Objective:    Pt admitted with hypotention                Action/Plan:  Pt is from home independent with adult son. Pt requested CM Contact daughter in law Joe for choice of [facility] agency.  CM spoke with both son Gene and Denice Paradise and was informed that pts live in [place] is currently hospitalized at [facility], per son , pts son will discharge to [facility].  Pt son is disabled.  Pt will only have to take care of himself post discharge, son stated that he feels pt is safe to manage at home using cane for ambulation assistance with close family support.     Expected Discharge Date:                  Expected Discharge Plan:  [place]  In-House Referral:     Discharge planning Services  CM Consult  Post Acute Care Choice:    Choice offered to:  Adult Children (pt deferred agency to daughter in Chemical [employer])  DME Arranged:    DME Agency:     HH Arranged:  PT, RN [place] Agency:  [place]  Status of Service:  Completed, signed off  Medicare Important Message Given:  Yes Date Medicare IM Given:    Medicare IM give by:    Date Additional Medicare IM Given:    Additional Medicare Important Message give by:     If discussed at [place] of Stay Meetings, dates discussed:    Additional Comments: CM contacted Denice Paradise [facility] was chosen, agency contacted and referral was accepted.   Maryclare Labrador, RN 01/03/2016, 12:16 PM

## 2016-01-03 NOTE — Progress Notes (Signed)
Patient Name: Micheal Nicholson Date of Encounter: 01/03/2016  Principal Problem:   Hypotension Active Problems:   Anxiety state   GERD (gastroesophageal reflux disease)   Paroxysmal atrial fibrillation (HCC)   Trifascicular block   Chronic combined systolic and diastolic CHF, NYHA class 2 (HCC)   Bradycardia   Acute renal failure (ARF) (HCC)   Hyponatremia   Elevated troponin   Renal failure   Length of Stay: 5  SUBJECTIVE  Despite moderate bradycardia he remains asymptomatic at rest and walking to the nurses' station. Denies angina and dyspnea  CURRENT MEDS . apixaban  5 mg Oral BID  . dutasteride  0.5 mg Oral Daily   And  . tamsulosin  0.4 mg Oral Daily  . feeding supplement (ENSURE ENLIVE)  237 mL Oral BID BM  . fluticasone  2 spray Each Nare BID  . lisinopril  5 mg Oral Daily  . loratadine  10 mg Oral Daily  . pantoprazole  40 mg Oral Daily  . pramipexole  0.125 mg Oral QHS  . sertraline  150 mg Oral Daily    OBJECTIVE   Intake/Output Summary (Last 24 hours) at 01/03/16 1116 Last data filed at 01/03/16 0800  Gross per 24 hour  Intake    440 ml  Output    300 ml  Net    140 ml   Filed Weights   01/01/16 0400 01/01/16 0401 01/02/16 0524  Weight: 163 lb 2.3 oz (74 kg) 163 lb 2.3 oz (74 kg) 168 lb 14 oz (76.6 kg)    PHYSICAL EXAM Filed Vitals:   01/02/16 1213 01/02/16 1528 01/02/16 2005 01/03/16 0500  BP: 144/78 142/88 149/73   Pulse: 39 45 48 52  Temp: 97.6 F (36.4 C) 98.1 F (36.7 C) 98.6 F (37 C) 97.6 F (36.4 C)  TempSrc: Oral Oral Oral Oral  Resp: 26 21 20    Height:      Weight:      SpO2: 97% 100% 91% 93%   General: Alert, oriented x3, no distress Head: no evidence of trauma, PERRL, EOMI, no exophtalmos or lid lag, no myxedema, no xanthelasma; normal ears, nose and oropharynx Neck: normal jugular venous pulsations and no hepatojugular reflux; brisk carotid pulses without delay and no carotid bruits Chest: clear to auscultation, no  signs of consolidation by percussion or palpation, normal fremitus, symmetrical and full respiratory excursions Cardiovascular: normal position and quality of the apical impulse, slow irregular rhythm, normal first and widely split second heart sounds, no rubs or gallops, 1/6 systolic murmur Abdomen: no tenderness or distention, no masses by palpation, no abnormal pulsatility or arterial bruits, normal bowel sounds, no hepatosplenomegaly Extremities: no clubbing, cyanosis or edema; 2+ radial, ulnar and brachial pulses bilaterally; 2+ right femoral, posterior tibial and dorsalis pedis pulses; 2+ left femoral, posterior tibial and dorsalis pedis pulses; no subclavian or femoral bruits Neurological: grossly nonfocal  LABS  CBC  Recent Labs  01/01/16 0237 01/03/16 0538  WBC 5.7 4.7  HGB 10.7* 10.7*  HCT 31.4* 31.2*  MCV 87.2 86.9  PLT 132* [id number]   Basic Metabolic Panel  Recent Labs  01/02/16 0308 01/03/16 0538  NA 136 138  K 3.8 3.8  CL 102 104  CO2 23 24  GLUCOSE 96 97  BUN 21* 18  CREATININE 1.16 1.23  CALCIUM 8.7* 8.6*   Liver Function Tests  Recent Labs  01/01/16 0237  AST 19  ALT 14*  ALKPHOS 47  BILITOT 1.3*  PROT 5.5*  ALBUMIN  2.9*   Radiology Studies Imaging results have been reviewed and No results found.  TELE atrial fibrillation with slow response, 40s at rest 50-60s with walking.   ASSESSMENT AND PLAN  Borderline systolic hypertension, meds recently adjusted. HR slow, but well tolerated. He continues to prefer least invasive approach. Plan to DC home today. Has appointment with me 01/24/16. Continue apixaban, current lisinopril dose and keep off beta blocker. May need to increase lisinopril dose at f/u visit. Resume diuretic at half of previous dose (40 mg furosemide daily instead of BID).   Sanda Klein, MD, [facility] [phone] office [phone] pager 01/03/2016 11:16 AM

## 2016-01-03 NOTE — Discharge Summary (Signed)
Physician Discharge Summary  Micheal Nicholson [id number] DOB: 1920-01-02 DOA: 12/29/2015  PCP: [name], MD  Admit date: 12/29/2015 Discharge date: 01/03/2016  Time spent: greater than 30 minutes  Recommendations for Outpatient Follow-up:  1. Home PT, RN arranged 2. Monitor BMET   Discharge Diagnoses:  Principal Problem:   Hypotension Active Problems:   Anxiety state   GERD (gastroesophageal reflux disease)   Paroxysmal atrial fibrillation (HCC)   Trifascicular block   Chronic combined systolic and diastolic CHF, NYHA class 2 (HCC)   Bradycardia   Acute renal failure (ARF) (HCC)   Hyponatremia   Elevated troponin   Renal failure   Permanent atrial fibrillation (HCC)   Atrial fibrillation with slow ventricular response ([place])   Discharge Condition: stable  Diet recommendation: heart healthy  Filed Weights   01/01/16 0400 01/01/16 0401 01/02/16 0524  Weight: 74 kg (163 lb 2.3 oz) 74 kg (163 lb 2.3 oz) 76.6 kg (168 lb 14 oz)    History of present illness/Hospital Course:  82 yo M [employer] Veteran w/ a Hx Paroxysmal Atrial Fib and Trifascicular block, Chronic combined Systolic and Diastolic CHF who was sent to [facility] ED from PCP office for evaluation of hypotension at 88/50 and bradycardia with heart rate between 30s and 40s. Patient reported 1 week duration of progressively worsening fatigue, poor oral intake, intermittent headaches in bilateral frontal areas.   In the ED initial blood pressure noted to be 80/47 with heart rate 33-45 bpm. Na 131, Cr 2.33, troponin 0.07. Cardiology consulted.  Admitted to stepdown unit.  Hypotension - resolved -likely related to dehydration and use of concomitant use of antihypertensives   HTN -Cards has resumed ACE - BP reasonably controlled at this time   Dehydration - resolved  -due to poor oral intake/diet along w/ ongoing diuretic use. Intake has been good here.  Bradycardia -stopped BB - Cards following -  telemetry shows heart rates into the 30s and 40s. Asymptomatic.  Permanent atrial fibrillation -continuing eliquis per Cardiology   Failure to thrive Intake good here.  Dilated Cardiomyopathy / Chronic combined systolic and diastolic CHF, NYHA class 2 (HCC) Appears euvolemic  Acute renal failure (baseline Cr~1.16) Resolved  Hyponatremia -resolved - due to hypovolemia   Anxiety state -continue Xanax per home regimen - well controlled   Elevated troponin -likely demand ischemia in the setting of acute renal failure and dehydration - no angina - peaked at 0.07 - no further w/u planned   GERD  -continue PPI   Procedures:  none  Consultations:  cardiology  Discharge Exam: Filed Vitals:   01/02/16 2005 01/03/16 0500  BP: 149/73   Pulse: 48 52  Temp: 98.6 F (37 C) 97.6 F (36.4 C)  Resp: 20    See progress note  Discharge Instructions   Discharge Instructions    Diet - low sodium heart healthy    Complete by:  As directed      Discharge instructions    Complete by:  As directed   Change eliquis to 5 mg twice daily. Change lasix to 40 mg ONCE daily. Change lisinopril to 5 mg daily. Stop carvedilol     Increase activity slowly    Complete by:  As directed           Current Discharge Medication List    CONTINUE these medications which have CHANGED   Details  apixaban (ELIQUIS) 5 MG TABS tablet Take 1 tablet (5 mg total) by mouth 2 (two) times daily.  Qty: 60 tablet, Refills: 0    furosemide (LASIX) 40 MG tablet Take 1 tablet (40 mg total) by mouth daily. Qty: 60 tablet, Refills: 11    lisinopril (PRINIVIL,ZESTRIL) 5 MG tablet Take 1 tablet (5 mg total) by mouth daily. Qty: 30 tablet, Refills: 0      CONTINUE these medications which have NOT CHANGED   Details  ALPRAZolam (XANAX) 0.5 MG tablet TAKE 1 TABLET TWICE A DAY AS NEEDED Qty: 60 tablet, Refills: 2    Camphor-Eucalyptus-Menthol (VICKS VAPORUB EX) Apply 1 application topically as needed (to  help breathe).    Cholecalciferol (VITAMIN D PO) Take 1 tablet by mouth daily.    Dutasteride-Tamsulosin HCl 0.5-0.4 MG CAPS TAKE 1 CAPSULE BY MOUTH DAILY Qty: 90 capsule, Refills: 3    fluticasone (FLONASE) 50 MCG/ACT nasal spray Place 2 sprays into both nostrils 2 (two) times daily.     guaiFENesin (MUCINEX) 600 MG 12 hr tablet Take 1 tablet (600 mg total) by mouth 2 (two) times daily. Qty: 30 tablet, Refills: 0   Associated Diagnoses: Acute upper respiratory infection    KLOR-CON M10 10 MEQ tablet Take 1 tablet by mouth daily.    omeprazole (PRILOSEC) 20 MG capsule TAKE ONE CAPSULE BY MOUTH EVERY DAY Qty: 30 capsule, Refills: 5    pramipexole (MIRAPEX) 0.125 MG tablet Take 1 tablet (0.125 mg total) by mouth at bedtime. Qty: 30 tablet, Refills: 5    sertraline (ZOLOFT) 100 MG tablet TAKE 1.5 TABLETS BY MOUTH DAILY Qty: 45 tablet, Refills: 3    Vitamin D, Ergocalciferol, (DRISDOL) 50000 UNITS CAPS capsule Take 1 capsule (50,000 Units total) by mouth every 7 (seven) days. Qty: 30 capsule, Refills: 0      STOP taking these medications     carvedilol (COREG) 3.125 MG tablet        Allergies  Allergen Reactions  . Chocolate Other (See Comments)    Acid reflux   Follow-up Information    Follow up with [facility].   Why:  registered nurse, physical therapist   Contact information:   [address] [place] [address] [phone]        The results of significant diagnostics from this hospitalization (including imaging, microbiology, ancillary and laboratory) are listed below for reference.    Significant Diagnostic Studies: Dg Chest Portable 1 View  12/29/2015  CLINICAL DATA:  Bradycardia, weakness, cough EXAM: PORTABLE CHEST 1 VIEW COMPARISON:  03/14/2015 FINDINGS: Chronic cardiopericardial enlargement and aortic tortuosity. Stable upper mediastinal widening, likely ectatic vessels. Interstitial coarsening which is at the patient's  baseline. No suspected pulmonary edema. No pneumonia, effusion, or pneumothorax. IMPRESSION: Chronic findings.  No evidence of acute cardiopulmonary disease. Electronically Signed   By: Monte Fantasia M.D.   On: 12/29/2015 09:57    Microbiology: No results found for this or any previous visit (from the past 240 hour(s)).   Labs: Basic Metabolic Panel:  Recent Labs Lab 12/29/15 0934 12/29/15 0944 12/29/15 1420 12/30/15 0139 01/01/16 0237 01/02/16 0308 01/03/16 0538  NA 131* 130*  --  137 137 136 138  K 3.9 3.8  --  3.9 4.0 3.8 3.8  CL 94* 93*  --  101 105 102 104  CO2 25  --   --  26 25 23 24   GLUCOSE 107* 100*  --  99 100* 96 97  BUN 64* 58*  --  50* 19 21* 18  CREATININE 2.33* 2.20*  --  1.76* 1.25* 1.16 1.23  CALCIUM  8.8*  --   --  8.6* 8.7* 8.7* 8.6*  MG 2.5*  --  2.5*  --   --   --   --   PHOS  --   --  4.8*  --   --   --   --    Liver Function Tests:  Recent Labs Lab 12/29/15 0934 01/01/16 0237  AST 29 19  ALT 21 14*  ALKPHOS 55 47  BILITOT 1.6* 1.3*  PROT 6.7 5.5*  ALBUMIN 3.7 2.9*   No results for input(s): LIPASE, AMYLASE in the last 168 hours. No results for input(s): AMMONIA in the last 168 hours. CBC:  Recent Labs Lab 12/29/15 0934 12/29/15 0944 12/30/15 0139 01/01/16 0237 01/03/16 0538  WBC 4.9  --  3.9* 5.7 4.7  NEUTROABS 3.3  --   --   --   --   HGB 12.3* 13.3 11.2* 10.7* 10.7*  HCT 35.4* 39.0 31.7* 31.4* 31.2*  MCV 87.6  --  85.7 87.2 86.9  PLT 150  --  120* 132* 165   Cardiac Enzymes:  Recent Labs Lab 12/29/15 0934 12/29/15 1420 12/29/15 1906 12/30/15 0135  TROPONINI 0.07* 0.06* 0.06* 0.07*   BNP: BNP (last 3 results)  Recent Labs  03/14/15 1737  BNP 813.5*    ProBNP (last 3 results) No results for input(s): PROBNP in the last 8760 hours.  CBG: No results for input(s): GLUCAP in the last 168 hours.     Signed:  Delfina Redwood MD [facility] 01/03/2016, 11:39 AM

## 2016-01-03 NOTE — Care Management Important Message (Signed)
Important Message  Patient Details  Name: Arta Baz MRN: [id number] Date of Birth: 10/10/1920   Medicare Important Message Given:  Yes    Louanne Belton 01/03/2016, 12:45 [place] Message  Patient Details  Name: Herrick Hafeman MRN: [id number] Date of Birth: January 03, 1920   Medicare Important Message Given:  Yes    Shmiel Morton G 01/03/2016, 12:45 PM

## 2016-01-04 ENCOUNTER — Telehealth: Payer: Self-pay

## 2016-01-04 DIAGNOSIS — I5042 Chronic combined systolic (congestive) and diastolic (congestive) heart failure: Secondary | ICD-10-CM | POA: Diagnosis not present

## 2016-01-04 DIAGNOSIS — I453 Trifascicular block: Secondary | ICD-10-CM | POA: Diagnosis not present

## 2016-01-04 DIAGNOSIS — F419 Anxiety disorder, unspecified: Secondary | ICD-10-CM | POA: Diagnosis not present

## 2016-01-04 DIAGNOSIS — R001 Bradycardia, unspecified: Secondary | ICD-10-CM | POA: Diagnosis not present

## 2016-01-04 DIAGNOSIS — K219 Gastro-esophageal reflux disease without esophagitis: Secondary | ICD-10-CM | POA: Diagnosis not present

## 2016-01-04 DIAGNOSIS — I480 Paroxysmal atrial fibrillation: Secondary | ICD-10-CM | POA: Diagnosis not present

## 2016-01-04 DIAGNOSIS — I959 Hypotension, unspecified: Secondary | ICD-10-CM | POA: Diagnosis not present

## 2016-01-04 NOTE — Telephone Encounter (Signed)
Transition Care Management Follow-up Telephone Call  How have you been since you were released from the hospital? I been doing better    Do you understand why you were in the hospital? yes   Do you understand the discharge instrcutions? yes  Items Reviewed:  Medications reviewed: yes  Allergies reviewed: yes  Dietary changes reviewed: yes  Referrals reviewed: yes   Functional Questionnaire:   Activities of Daily Living (ADLs):   He states they are independent in the following: ambulation, bathing and hygiene, feeding, continence, grooming, toileting and dressing States they require assistance with the following: None   Any transportation issues/concerns?: no   Any patient concerns? no   Confirmed importance and date/time of follow-up visits scheduled: yes   Confirmed with patient if condition begins to worsen call PCP or go to the ER.  Patient was given the Call-a-Nurse line [phone]: yes  Pt has his hospital follow up on Monday 01/09/15 at 10:30

## 2016-01-05 DIAGNOSIS — F419 Anxiety disorder, unspecified: Secondary | ICD-10-CM | POA: Diagnosis not present

## 2016-01-05 DIAGNOSIS — I480 Paroxysmal atrial fibrillation: Secondary | ICD-10-CM | POA: Diagnosis not present

## 2016-01-05 DIAGNOSIS — I453 Trifascicular block: Secondary | ICD-10-CM | POA: Diagnosis not present

## 2016-01-05 DIAGNOSIS — K219 Gastro-esophageal reflux disease without esophagitis: Secondary | ICD-10-CM | POA: Diagnosis not present

## 2016-01-05 DIAGNOSIS — I5042 Chronic combined systolic (congestive) and diastolic (congestive) heart failure: Secondary | ICD-10-CM | POA: Diagnosis not present

## 2016-01-05 DIAGNOSIS — I959 Hypotension, unspecified: Secondary | ICD-10-CM | POA: Diagnosis not present

## 2016-01-05 DIAGNOSIS — R001 Bradycardia, unspecified: Secondary | ICD-10-CM | POA: Diagnosis not present

## 2016-01-07 ENCOUNTER — Telehealth: Payer: Self-pay | Admitting: Family Medicine

## 2016-01-07 DIAGNOSIS — F419 Anxiety disorder, unspecified: Secondary | ICD-10-CM | POA: Diagnosis not present

## 2016-01-07 DIAGNOSIS — I5042 Chronic combined systolic (congestive) and diastolic (congestive) heart failure: Secondary | ICD-10-CM | POA: Diagnosis not present

## 2016-01-07 DIAGNOSIS — I480 Paroxysmal atrial fibrillation: Secondary | ICD-10-CM | POA: Diagnosis not present

## 2016-01-07 DIAGNOSIS — I959 Hypotension, unspecified: Secondary | ICD-10-CM | POA: Diagnosis not present

## 2016-01-07 DIAGNOSIS — I453 Trifascicular block: Secondary | ICD-10-CM | POA: Diagnosis not present

## 2016-01-07 DIAGNOSIS — K219 Gastro-esophageal reflux disease without esophagitis: Secondary | ICD-10-CM | POA: Diagnosis not present

## 2016-01-07 DIAGNOSIS — R001 Bradycardia, unspecified: Secondary | ICD-10-CM | POA: Diagnosis not present

## 2016-01-07 NOTE — Telephone Encounter (Signed)
Micheal Nicholson with [facility] call to say that pt heart rate 45 beats a minute and he is not taking coreg. His bp 122/74

## 2016-01-07 NOTE — Telephone Encounter (Signed)
Continue to hold Coreg for heart rate < 60. He should be seen in ER for any syncope, dyspnea, extreme dizziness,or any chest pain. Recommend office follow up next week to reassess.

## 2016-01-07 NOTE — Telephone Encounter (Signed)
Home Health is aware. He has a pending appt 1/16 to discuss his recent Hospital F/U for bradycardia.

## 2016-01-10 ENCOUNTER — Ambulatory Visit (INDEPENDENT_AMBULATORY_CARE_PROVIDER_SITE_OTHER): Payer: Commercial Managed Care - HMO | Admitting: Family Medicine

## 2016-01-10 ENCOUNTER — Encounter: Payer: Self-pay | Admitting: Family Medicine

## 2016-01-10 VITALS — BP 120/70 | HR 68 | Temp 97.7°F | Ht 70.0 in | Wt 170.4 lb

## 2016-01-10 DIAGNOSIS — I5040 Unspecified combined systolic (congestive) and diastolic (congestive) heart failure: Secondary | ICD-10-CM | POA: Diagnosis not present

## 2016-01-10 DIAGNOSIS — R001 Bradycardia, unspecified: Secondary | ICD-10-CM

## 2016-01-10 DIAGNOSIS — I453 Trifascicular block: Secondary | ICD-10-CM | POA: Diagnosis not present

## 2016-01-10 DIAGNOSIS — K219 Gastro-esophageal reflux disease without esophagitis: Secondary | ICD-10-CM | POA: Diagnosis not present

## 2016-01-10 DIAGNOSIS — I959 Hypotension, unspecified: Secondary | ICD-10-CM | POA: Diagnosis not present

## 2016-01-10 DIAGNOSIS — I5042 Chronic combined systolic (congestive) and diastolic (congestive) heart failure: Secondary | ICD-10-CM | POA: Diagnosis not present

## 2016-01-10 DIAGNOSIS — I9589 Other hypotension: Secondary | ICD-10-CM

## 2016-01-10 DIAGNOSIS — I480 Paroxysmal atrial fibrillation: Secondary | ICD-10-CM | POA: Diagnosis not present

## 2016-01-10 DIAGNOSIS — F419 Anxiety disorder, unspecified: Secondary | ICD-10-CM | POA: Diagnosis not present

## 2016-01-10 DIAGNOSIS — E871 Hypo-osmolality and hyponatremia: Secondary | ICD-10-CM

## 2016-01-10 NOTE — Patient Instructions (Signed)
Change Furosemide 40 mg to ONCE daily Continue with daily weights and let us know if your weight goes up by more than 2 pounds in one day or three pounds in one week.

## 2016-01-10 NOTE — Progress Notes (Signed)
Pre visit review using our clinic review tool, if applicable. No additional management support is needed unless otherwise documented below in the visit note. 

## 2016-01-10 NOTE — Progress Notes (Signed)
Subjective:    Patient ID: Micheal Nicholson, male    DOB: 22-Jul-1920, 82 y.o.   MRN: [id number]  HPI Patient seen for hospital follow-up His chronic problems include history of atrial fibrillation, hypertension, combined systolic and diastolic heart failure, history of trifascicular block, GERD, chronic anxiety.  Patient admitted on January 4 and discharged on January 9 He presented to another primary care office within our system with complaints of fatigue and dizziness and had blood pressure 88/50 with bradycardia with heart rate between 30s and 40s. He also reported poor intake for the week prior. In emergency department he had blood pressure 80/47 with heart rate 33-45 and elevated creatinine 2.3 which is above his baseline of around 1.2.  Was felt that he was suffering from some dehydration and hypotension also related to antihypertensive use. Beta blocker was stopped. Lisinopril reduced to 5 mg. Furosemide was reduced to 40 mg once daily although patient was confused and has still been taking twice daily since discharge. His creatinine had improved prior to discharge. Mild hyponatremia.  Since discharge he feels near baseline. Ambulating with a cane. No falls. Appetite is good. Receiving home physical therapy and home nurse visits. Compliant with medications other than he has been confused and taking his Lasix twice daily. Home body weights have been stable  Past Medical History  Diagnosis Date  . Hypertension   . Anxiety   . BPH (benign prostatic hyperplasia)   . Chronic combined systolic and diastolic CHF, NYHA class 2 ([place]) 04/17/2015  . CHF (congestive heart failure) ([place])   . Symptomatic sinus bradycardia     WEAKNESS & HYPOTENSION  . GERD (gastroesophageal reflux disease)   . Arthritis    Past Surgical History  Procedure Laterality Date  . Cardiac catheterization  2006    Negative / 4 years ago    reports that he has quit smoking. He has never used smokeless tobacco.  He reports that he drinks alcohol. He reports that he does not use illicit drugs. family history includes Healthy in his son; Heart disease in his brother; Heart failure in his son; Lung cancer in his brother; Pancreatic cancer in his son; Pneumonia (age of onset: 24) in his father. Allergies  Allergen Reactions  . Chocolate Other (See Comments)    Acid reflux      Review of Systems  Constitutional: Negative for fatigue and unexpected weight change.  Eyes: Negative for visual disturbance.  Respiratory: Negative for cough, chest tightness and shortness of breath.   Cardiovascular: Negative for chest pain, palpitations and leg swelling.  Gastrointestinal: Negative for abdominal pain.  Endocrine: Negative for polydipsia and polyuria.  Genitourinary: Negative for dysuria.  Neurological: Negative for dizziness, syncope, weakness, light-headedness and headaches.  Psychiatric/Behavioral: Negative for confusion.       Objective:   Physical Exam  Constitutional: He appears well-developed and well-nourished.  HENT:  Mouth/Throat: Oropharynx is clear and moist.  Neck: Neck supple. No JVD present.  Cardiovascular:  Irregular rhythm with rate between 60 and 64  Pulmonary/Chest: Effort normal and breath sounds normal. No respiratory distress. He has no wheezes. He has no rales.  Musculoskeletal:  Trace edema legs bilaterally  Neurological: He is alert. No cranial nerve deficit.  No focal weakness. Ambulating with cane  Psychiatric: He has a normal mood and affect. His behavior is normal.          Assessment & Plan:  #1 recent hypotension related to dehydration - likely from poor intake and diuretic  therapy. Patient confused and still taking furosemide twice daily. Recheck basic metabolic panel. Blood pressure stable with sitting blood pressure left arm seated 120/70 and standing 118/64. Stay well-hydrated. Reduce Lasix to once daily. Continue daily weights  #2 atrial fibrillation.  Recent bradycardia.  Rate improved around 60-64. He's now off beta blocker. Continue blood thinner. Follow-up with cardiology as scheduled  #3 history of combined systolic and diastolic heart failure.  Symptomatically stable. Continue close monitoring of weights as above. He is now on lisinopril 5 mg daily.

## 2016-01-12 ENCOUNTER — Telehealth: Payer: Self-pay | Admitting: Family Medicine

## 2016-01-12 ENCOUNTER — Inpatient Hospital Stay (HOSPITAL_COMMUNITY)
Admission: EM | Admit: 2016-01-12 | Discharge: 2016-01-14 | DRG: 243 | Disposition: A | Discharge status: Home Health | Payer: Commercial Managed Care - HMO | Attending: Internal Medicine | Admitting: Internal Medicine

## 2016-01-12 ENCOUNTER — Encounter (HOSPITAL_COMMUNITY): Payer: Self-pay | Admitting: *Deleted

## 2016-01-12 DIAGNOSIS — Z87891 Personal history of nicotine dependence: Secondary | ICD-10-CM | POA: Diagnosis not present

## 2016-01-12 DIAGNOSIS — R001 Bradycardia, unspecified: Secondary | ICD-10-CM | POA: Diagnosis present

## 2016-01-12 DIAGNOSIS — I5042 Chronic combined systolic (congestive) and diastolic (congestive) heart failure: Secondary | ICD-10-CM | POA: Diagnosis present

## 2016-01-12 DIAGNOSIS — I110 Hypertensive heart disease with heart failure: Secondary | ICD-10-CM | POA: Diagnosis present

## 2016-01-12 DIAGNOSIS — Z91018 Allergy to other foods: Secondary | ICD-10-CM

## 2016-01-12 DIAGNOSIS — K219 Gastro-esophageal reflux disease without esophagitis: Secondary | ICD-10-CM | POA: Diagnosis not present

## 2016-01-12 DIAGNOSIS — I5022 Chronic systolic (congestive) heart failure: Secondary | ICD-10-CM | POA: Insufficient documentation

## 2016-01-12 DIAGNOSIS — I4891 Unspecified atrial fibrillation: Secondary | ICD-10-CM | POA: Diagnosis present

## 2016-01-12 DIAGNOSIS — I4510 Unspecified right bundle-branch block: Secondary | ICD-10-CM | POA: Diagnosis not present

## 2016-01-12 DIAGNOSIS — Z801 Family history of malignant neoplasm of trachea, bronchus and lung: Secondary | ICD-10-CM | POA: Diagnosis not present

## 2016-01-12 DIAGNOSIS — I519 Heart disease, unspecified: Secondary | ICD-10-CM | POA: Diagnosis not present

## 2016-01-12 DIAGNOSIS — I441 Atrioventricular block, second degree: Secondary | ICD-10-CM | POA: Diagnosis present

## 2016-01-12 DIAGNOSIS — I10 Essential (primary) hypertension: Secondary | ICD-10-CM

## 2016-01-12 DIAGNOSIS — I425 Other restrictive cardiomyopathy: Secondary | ICD-10-CM | POA: Diagnosis not present

## 2016-01-12 DIAGNOSIS — Z959 Presence of cardiac and vascular implant and graft, unspecified: Secondary | ICD-10-CM

## 2016-01-12 DIAGNOSIS — Z800 Family history of malignant neoplasm of digestive organs: Secondary | ICD-10-CM | POA: Diagnosis not present

## 2016-01-12 DIAGNOSIS — Z8249 Family history of ischemic heart disease and other diseases of the circulatory system: Secondary | ICD-10-CM

## 2016-01-12 DIAGNOSIS — I480 Paroxysmal atrial fibrillation: Secondary | ICD-10-CM | POA: Diagnosis present

## 2016-01-12 DIAGNOSIS — I959 Hypotension, unspecified: Secondary | ICD-10-CM | POA: Diagnosis not present

## 2016-01-12 DIAGNOSIS — R531 Weakness: Secondary | ICD-10-CM | POA: Diagnosis not present

## 2016-01-12 DIAGNOSIS — I442 Atrioventricular block, complete: Secondary | ICD-10-CM | POA: Diagnosis present

## 2016-01-12 DIAGNOSIS — D649 Anemia, unspecified: Secondary | ICD-10-CM | POA: Diagnosis present

## 2016-01-12 DIAGNOSIS — N400 Enlarged prostate without lower urinary tract symptoms: Secondary | ICD-10-CM | POA: Diagnosis not present

## 2016-01-12 DIAGNOSIS — Z7902 Long term (current) use of antithrombotics/antiplatelets: Secondary | ICD-10-CM | POA: Diagnosis not present

## 2016-01-12 DIAGNOSIS — I440 Atrioventricular block, first degree: Secondary | ICD-10-CM | POA: Diagnosis present

## 2016-01-12 DIAGNOSIS — I481 Persistent atrial fibrillation: Secondary | ICD-10-CM | POA: Diagnosis not present

## 2016-01-12 DIAGNOSIS — F419 Anxiety disorder, unspecified: Secondary | ICD-10-CM | POA: Diagnosis present

## 2016-01-12 DIAGNOSIS — I517 Cardiomegaly: Secondary | ICD-10-CM | POA: Diagnosis not present

## 2016-01-12 DIAGNOSIS — I453 Trifascicular block: Secondary | ICD-10-CM | POA: Diagnosis not present

## 2016-01-12 LAB — I-STAT TROPONIN, ED: Troponin i, poc: 0.02 ng/mL (ref 0.00–0.08)

## 2016-01-12 LAB — BASIC METABOLIC PANEL
Anion gap: 10 (ref 5–15)
BUN: 14 mg/dL (ref 6–20)
CO2: 25 mmol/L (ref 22–32)
CREATININE: 1.4 mg/dL — AB (ref 0.61–1.24)
Calcium: 9.1 mg/dL (ref 8.9–10.3)
Chloride: 102 mmol/L (ref 101–111)
GFR calc Af Amer: 48 mL/min — ABNORMAL LOW (ref >60)
GFR, EST NON AFRICAN AMERICAN: 42 mL/min — AB (ref >60)
Glucose, Bld: 105 mg/dL — ABNORMAL HIGH (ref 65–99)
Potassium: 4.6 mmol/L (ref 3.5–5.1)
SODIUM: 137 mmol/L (ref 135–145)

## 2016-01-12 LAB — CBC
HCT: 33.5 % — ABNORMAL LOW (ref 39.0–52.0)
Hemoglobin: 11.3 g/dL — ABNORMAL LOW (ref 13.0–17.0)
MCH: 30 pg (ref 26.0–34.0)
MCHC: 33.7 g/dL (ref 30.0–36.0)
MCV: 88.9 fL (ref 78.0–100.0)
PLATELETS: 232 10*3/uL (ref 150–400)
RBC: 3.77 MIL/uL — ABNORMAL LOW (ref 4.22–5.81)
RDW: 13.3 % (ref 11.5–15.5)
WBC: 4.3 10*3/uL (ref 4.0–10.5)

## 2016-01-12 MED ORDER — APIXABAN 5 MG PO TABS
5.0000 mg | ORAL_TABLET | Freq: Two times a day (BID) | ORAL | Status: DC
  Administered 2016-01-12: 5 mg via ORAL
  Filled 2016-01-12: qty 1

## 2016-01-12 MED ORDER — TAMSULOSIN HCL 0.4 MG PO CAPS
0.4000 mg | ORAL_CAPSULE | Freq: Every morning | ORAL | Status: DC
  Administered 2016-01-13 – 2016-01-14 (×2): 0.4 mg via ORAL
  Filled 2016-01-12 (×2): qty 1

## 2016-01-12 MED ORDER — POTASSIUM CHLORIDE CRYS ER 10 MEQ PO TBCR
10.0000 meq | EXTENDED_RELEASE_TABLET | Freq: Every day | ORAL | Status: DC
  Administered 2016-01-13 – 2016-01-14 (×2): 10 meq via ORAL
  Filled 2016-01-12 (×2): qty 1

## 2016-01-12 MED ORDER — DUTASTERIDE 0.5 MG PO CAPS
0.5000 mg | ORAL_CAPSULE | Freq: Every day | ORAL | Status: DC
  Administered 2016-01-13 – 2016-01-14 (×2): 0.5 mg via ORAL
  Filled 2016-01-12 (×2): qty 1

## 2016-01-12 MED ORDER — LISINOPRIL 5 MG PO TABS
5.0000 mg | ORAL_TABLET | Freq: Every day | ORAL | Status: DC
  Administered 2016-01-13 – 2016-01-14 (×2): 5 mg via ORAL
  Filled 2016-01-12 (×2): qty 1

## 2016-01-12 MED ORDER — ALPRAZOLAM 0.5 MG PO TABS
0.5000 mg | ORAL_TABLET | Freq: Two times a day (BID) | ORAL | Status: DC | PRN
  Administered 2016-01-12 – 2016-01-13 (×2): 0.5 mg via ORAL
  Filled 2016-01-12 (×2): qty 1

## 2016-01-12 MED ORDER — PANTOPRAZOLE SODIUM 40 MG PO TBEC
40.0000 mg | DELAYED_RELEASE_TABLET | Freq: Every day | ORAL | Status: DC
  Administered 2016-01-13 – 2016-01-14 (×2): 40 mg via ORAL
  Filled 2016-01-12 (×2): qty 1

## 2016-01-12 MED ORDER — DUTASTERIDE-TAMSULOSIN HCL 0.5-0.4 MG PO CAPS: 1.0000 | ORAL_CAPSULE | Freq: Every day | ORAL | Status: DC

## 2016-01-12 MED ORDER — SERTRALINE HCL 100 MG PO TABS
100.0000 mg | ORAL_TABLET | Freq: Every day | ORAL | Status: DC
  Administered 2016-01-13 – 2016-01-14 (×2): 100 mg via ORAL
  Filled 2016-01-12 (×2): qty 1

## 2016-01-12 MED ORDER — PRAMIPEXOLE DIHYDROCHLORIDE 0.125 MG PO TABS
0.1250 mg | ORAL_TABLET | Freq: Every day | ORAL | Status: DC
  Administered 2016-01-12 – 2016-01-13 (×2): 0.125 mg via ORAL
  Filled 2016-01-12 (×3): qty 1

## 2016-01-12 MED ORDER — FLUTICASONE PROPIONATE 50 MCG/ACT NA SUSP
2.0000 | Freq: Two times a day (BID) | NASAL | Status: DC
  Administered 2016-01-12 – 2016-01-14 (×4): 2 via NASAL
  Filled 2016-01-12: qty 16

## 2016-01-12 MED ORDER — FUROSEMIDE 40 MG PO TABS
40.0000 mg | ORAL_TABLET | Freq: Every day | ORAL | Status: DC
  Administered 2016-01-13 – 2016-01-14 (×2): 40 mg via ORAL
  Filled 2016-01-12 (×2): qty 1

## 2016-01-12 NOTE — ED Notes (Signed)
Pt reports therapist sending pt here due to bradycardia, HR 40 at triage, reports unable to get it above 60 even with exercise. Pt appears pale at triage but alert and answering questions appropriately.

## 2016-01-12 NOTE — Telephone Encounter (Signed)
Spoke with Dr. Elease Hashimoto and patient. Patient and home health nurse confirm he has not taken his beta blocker and this is his heartrate without medication. Patient denies feeling of blacking out but has had increase in fatigue. Home health nurse states patient can usually walk to mailbox but today he had to drive to mailbox due to weakness and just seems "different" today. Had home health nurse check heart rate again while on phone and HR was 38. Speaking with Dr. Elease Hashimoto, we advised patient to go to ED for further evaluation. Patient is not scheduled to see cardiology until 30th of this month. Home health nurse, Benjamine Mola, plans to reach out to family to take him to ED soon.

## 2016-01-12 NOTE — ED Notes (Signed)
Pt was recently hospitalized for bradycardia.  Cardiology wanted pt to stop taking his Coreg and look for improvement with follow up appointment on 01/24/16 and potential for pacemaker.  Pt did have improvement with increased energy until last night.  Lowest noted HR on monitor was 32.  Pt denies pain or other complaints.  Ambulated with steady gait to room, no SOB noted or excess fatigue.

## 2016-01-12 NOTE — ED Notes (Signed)
Dinner tray ordered, heart healthy diet 

## 2016-01-12 NOTE — H&P (Signed)
Primary Physician:  [place] Primary Cardiologist:  [place]   HPI: Pt is a 82 yo who was recently discharged frm [place]  Hx of HTN, paroxysmal atrial fib, bradycardia , chronic systolic CHF  LVEF 30 to [id number]  Nuclear scan with small apical inferolateal fixed defect   He was just admitted this month with hypotension and bradycardia  HR 30s to 402  Occurred for 1 wk  Pt given IV fluids  Meds held  B Blocker stopped  Sent home on ACE I   Right after d/c  he had more energy but only for a couple days   Today had weakness.  No CP  Getting out of car  Very weak.  No syncope      Past Medical History  Diagnosis Date  . Hypertension   . Anxiety   . BPH (benign prostatic hyperplasia)   . Chronic combined systolic and diastolic CHF, NYHA class 2 ([place]) 04/17/2015  . CHF (congestive heart failure) ([place])   . Symptomatic sinus bradycardia     WEAKNESS & HYPOTENSION  . GERD (gastroesophageal reflux disease)   . Arthritis      (Not in a hospital admission)     Infusions:   Allergies  Allergen Reactions  . Chocolate Other (See Comments)    Acid reflux    Social History   Social History  . Marital Status: Widowed    Spouse Name: N/A  . Number of Children: N/A  . Years of Education: N/A   Occupational History  . Not on file.   Social History Main Topics  . Smoking status: Former Smoker -- 0.50 packs/day for 5 years  . Smokeless tobacco: Never Used  . Alcohol Use: Yes     Comment: occ  . Drug Use: No  . Sexual Activity: Not on file   Other Topics Concern  . Not on file   Social History Narrative    Family History  Problem Relation Age of Onset  . Pneumonia Father 57    died  . Lung cancer Brother   . Heart disease Brother   . Pancreatic cancer Son   . Healthy Son   . Heart failure Son     REVIEW OF SYSTEMS:  All systems reviewed  Negative to the above problem except as noted above.    PHYSICAL EXAM: Filed Vitals:   01/12/16 1719 01/12/16 1720    BP: 156/76   Pulse:  56  Resp:  17    No intake or output data in the 24 hours ending 01/12/16 1819  General:  Well appearing. No respiratory difficulty HEENT: normal Neck: supple. no JVD. Carotids 2+ bilat; no bruits. No lymphadenopathy or thryomegaly appreciated. Cor: PMI nondisplaced. Regular rate & rhythm. No rubs, gallops or murmurs. Lungs: clear Abdomen: soft, nontender, nondistended. No hepatosplenomegaly. No bruits or masses. Good bowel sounds. Extremities: no cyanosis, clubbing, rash, edema Neuro: alert & oriented x 3, cranial nerves grossly intact. moves all 4 extremities w/o difficulty. Affect pleasant.  ECG: Atrial fibrillation  41 bpm  RBBB  LAFB  St T wave changes consider ischemia    Results for orders placed or performed during the hospital encounter of 01/12/16 (from the past 24 hour(s))  Basic metabolic panel     Status: Abnormal   Collection Time: 01/12/16  4:35 PM  Result Value Ref Range   Sodium 137 135 - 145 mmol/L   Potassium 4.6 3.5 - 5.1 mmol/L   Chloride 102 101 - 111  mmol/L   CO2 25 22 - 32 mmol/L   Glucose, Bld 105 (H) 65 - 99 mg/dL   BUN 14 6 - 20 mg/dL   Creatinine, Ser 1.40 (H) 0.61 - 1.24 mg/dL   Calcium 9.1 8.9 - 10.3 mg/dL   GFR calc non Af Amer 42 (L) >60 mL/min   GFR calc Af Amer 48 (L) >60 mL/min   Anion gap 10 5 - 15  CBC     Status: Abnormal   Collection Time: 01/12/16  4:35 PM  Result Value Ref Range   WBC 4.3 4.0 - 10.5 K/uL   RBC 3.77 (L) 4.22 - 5.81 MIL/uL   Hemoglobin 11.3 (L) 13.0 - 17.0 g/dL   HCT 33.5 (L) 39.0 - 52.0 %   MCV 88.9 78.0 - 100.0 fL   MCH 30.0 26.0 - 34.0 pg   MCHC 33.7 30.0 - 36.0 g/dL   RDW 13.3 11.5 - 15.5 %   Platelets 232 150 - 400 K/uL  I-stat troponin, ED (not at [facility], [facility])     Status: None   Collection Time: 01/12/16  4:47 PM  Result Value Ref Range   Troponin i, poc 0.02 0.00 - 0.08 ng/mL   Comment 3           No results found.   ASSESSMENT: 82 yo with chronic systolic CHF  Presents with  weakness and bradycardia.  HR has not improved since d/c of b blocker at last admit  BP is OK Will admit  Contact EP in AM   ? Pacer (single chamber vs BiV) He is not having CP but does have ST T wave changes on this EKG and earlier this  mnth  Different from 2015  2.  Chronic systolic CHF  Volume appears OK  3.  HTN  BP is a little high  Continue ARB.    NPO after MN  Dorris Carnes

## 2016-01-12 NOTE — Telephone Encounter (Signed)
[facility] with [facility] call to say that she is with the pt and his heart rate 35  bp was good 126/64  She said he does not the energy he had last week when she saw him. Michela Pitcher he sleeping a lot today     [phone]

## 2016-01-12 NOTE — ED Provider Notes (Signed)
CSN: [id number]     Arrival date & time 01/12/16  1621 History   First MD Initiated Contact with Patient 01/12/16 1749     Chief Complaint  Patient presents with  . Abnormal ECG   Chief complaint generalized weakness  (Consider location/radiation/quality/duration/timing/severity/associated sxs/prior Treatment) HPI She has had generalized weakness onset yesterday. He was hospitalized earlier this month determined to have bradycardia. Coreg was discontinued after evaluation by cardiology service. It was discussed with patient that he may need pacemaker if he continues to have bradycardia.Micheal Nicholson He denies chest pain denies other associated symptoms. His son reports that when getting out of a car yesterday he became extremely weak. No other associated symptoms. No treatment prior to coming here Past Medical History  Diagnosis Date  . Hypertension   . Anxiety   . BPH (benign prostatic hyperplasia)   . Chronic combined systolic and diastolic CHF, NYHA class 2 ([place]) 04/17/2015  . CHF (congestive heart failure) ([place])   . Symptomatic sinus bradycardia     WEAKNESS & HYPOTENSION  . GERD (gastroesophageal reflux disease)   . Arthritis    Past Surgical History  Procedure Laterality Date  . Cardiac catheterization  2006    Negative / 4 years ago   Family History  Problem Relation Age of Onset  . Pneumonia Father 9    died  . Lung cancer Brother   . Heart disease Brother   . Pancreatic cancer Son   . Healthy Son   . Heart failure Son    Social History  Substance Use Topics  . Smoking status: Former Smoker -- 0.50 packs/day for 5 years  . Smokeless tobacco: Never Used  . Alcohol Use: Yes     Comment: occ    Review of Systems  HENT: Negative.   Respiratory: Negative.   Cardiovascular: Negative.   Gastrointestinal: Negative.   Musculoskeletal: Negative.   Skin: Negative.   Neurological: Positive for weakness.  Psychiatric/Behavioral: Negative.   All other systems reviewed and are  negative.     Allergies  Chocolate  Home Medications   Prior to Admission medications   Medication Sig Start Date End Date Taking? Authorizing Provider  ALPRAZolam (XANAX) 0.5 MG tablet TAKE 1 TABLET TWICE A DAY AS NEEDED Patient taking differently: TAKE 1 TABLET TWICE A DAY AS NEEDED FOR NERVES 10/21/15  Yes [name], MD  apixaban (ELIQUIS) 5 MG TABS tablet Take 1 tablet (5 mg total) by mouth 2 (two) times daily. 01/03/16  Yes Delfina Redwood, MD  Camphor-Eucalyptus-Menthol (VICKS VAPORUB EX) Apply 1 application topically as needed (to help breathe).   Yes Historical Provider, MD  Cholecalciferol (VITAMIN D PO) Take 1 tablet by mouth daily.   Yes Historical Provider, MD  Dutasteride-Tamsulosin HCl 0.5-0.4 MG CAPS TAKE 1 CAPSULE BY MOUTH DAILY 10/20/15  Yes [name], MD  fluticasone (FLONASE) 50 MCG/ACT nasal spray Place 2 sprays into both nostrils 2 (two) times daily.    Yes Historical Provider, MD  furosemide (LASIX) 40 MG tablet Take 1 tablet (40 mg total) by mouth daily. 01/03/16  Yes Delfina Redwood, MD  guaiFENesin (MUCINEX) 600 MG 12 hr tablet Take 1 tablet (600 mg total) by mouth 2 (two) times daily. 12/23/15  Yes Renee A Kuneff, DO  KLOR-CON M10 10 MEQ tablet Take 1 tablet by mouth daily. 07/23/15  Yes Historical Provider, MD  lisinopril (PRINIVIL,ZESTRIL) 5 MG tablet Take 1 tablet (5 mg total) by mouth daily. 01/03/16  Yes Delfina Redwood,  MD  omeprazole (PRILOSEC) 20 MG capsule TAKE ONE CAPSULE BY MOUTH EVERY DAY 09/09/15  Yes [name], MD  pramipexole (MIRAPEX) 0.125 MG tablet Take 1 tablet (0.125 mg total) by mouth at bedtime. 09/09/15  Yes [name], MD  sertraline (ZOLOFT) 100 MG tablet TAKE 1.5 TABLETS BY MOUTH DAILY 10/25/15  Yes [name], MD  Vitamin D, Ergocalciferol, (DRISDOL) 50000 UNITS CAPS capsule Take 1 capsule (50,000 Units total) by mouth every 7 (seven) days. 03/08/15  Yes [name], MD   BP 156/76 mmHg  Pulse  56  Resp 17  Wt 171 lb 1.6 oz (77.61 kg)  SpO2 97% Physical Exam  Constitutional: He appears well-developed and well-nourished.  HENT:  Head: Normocephalic and atraumatic.  Eyes: Conjunctivae are normal. Pupils are equal, round, and reactive to light.  Neck: Neck supple. No tracheal deviation present. No thyromegaly present.  Cardiovascular: Regular rhythm.   No murmur heard. Bradycardic, irregularly irregular  Pulmonary/Chest: Effort normal and breath sounds normal.  Abdominal: Soft. Bowel sounds are normal. He exhibits no distension. There is no tenderness.  Musculoskeletal: Normal range of motion. He exhibits no edema or tenderness.  Neurological: He is alert. Coordination normal.  Skin: Skin is warm and dry. No rash noted.  Psychiatric: He has a normal mood and affect.  Nursing note and vitals reviewed.   ED Course  Procedures (including critical care time) Labs Review Labs Reviewed  BASIC METABOLIC PANEL - Abnormal; Notable for the following:    Glucose, Bld 105 (*)    Creatinine, Ser 1.40 (*)    GFR calc non Af Amer 42 (*)    GFR calc Af Amer 48 (*)    All other components within normal limits  CBC - Abnormal; Notable for the following:    RBC 3.77 (*)    Hemoglobin 11.3 (*)    HCT 33.5 (*)    All other components within normal limits  I-STAT TROPOININ, ED    Imaging Review No results found. I have personally reviewed and evaluated these images and lab results as part of my medical decision-making.   EKG Interpretation   Date/Time:  Wednesday January 12 2016 16:30:18 EST Ventricular Rate:  41 PR Interval:    QRS Duration: 160 QT Interval:  536 QTC Calculation: 442 R Axis:   -81 Text Interpretation:  Atrial fibrillation with slow ventricular response  Left axis deviation Right bundle branch block T wave abnormality, consider  inferolateral ischemia or digitalis effect Abnormal ECG No significant  change since last tracing Confirmed by Winfred Leeds  MD, Leeya Rusconi  [id number]) on  01/12/2016 4:57:44 PM     Results for orders placed or performed during the hospital encounter of [id number]  Basic metabolic panel  Result Value Ref Range   Sodium 137 135 - 145 mmol/L   Potassium 4.6 3.5 - 5.1 mmol/L   Chloride 102 101 - 111 mmol/L   CO2 25 22 - 32 mmol/L   Glucose, Bld 105 (H) 65 - 99 mg/dL   BUN 14 6 - 20 mg/dL   Creatinine, Ser 1.40 (H) 0.61 - 1.24 mg/dL   Calcium 9.1 8.9 - 10.3 mg/dL   GFR calc non Af Amer 42 (L) >60 mL/min   GFR calc Af Amer 48 (L) >60 mL/min   Anion gap 10 5 - 15  CBC  Result Value Ref Range   WBC 4.3 4.0 - 10.5 K/uL   RBC 3.77 (L) 4.22 - 5.81 MIL/uL   Hemoglobin 11.3 (  L) 13.0 - 17.0 g/dL   HCT 33.5 (L) 39.0 - 52.0 %   MCV 88.9 78.0 - 100.0 fL   MCH 30.0 26.0 - 34.0 pg   MCHC 33.7 30.0 - 36.0 g/dL   RDW 13.3 11.5 - 15.5 %   Platelets 232 150 - 400 K/uL  I-stat troponin, ED (not at [facility])  Result Value Ref Range   Troponin i, poc 0.02 0.00 - 0.08 ng/mL   Comment 3           Dg Chest Portable 1 View  12/29/2015  CLINICAL DATA:  Bradycardia, weakness, cough EXAM: PORTABLE CHEST 1 VIEW COMPARISON:  03/14/2015 FINDINGS: Chronic cardiopericardial enlargement and aortic tortuosity. Stable upper mediastinal widening, likely ectatic vessels. Interstitial coarsening which is at the patient's baseline. No suspected pulmonary edema. No pneumonia, effusion, or pneumothorax. IMPRESSION: Chronic findings.  No evidence of acute cardiopulmonary disease. Electronically Signed   By: Monte Fantasia M.D.   On: 12/29/2015 09:57    MDM  Dr.  Harrington Challenger from cardiology service was consulted and arranged for inpatient bed Final diagnoses:  None   diagnosis #1bradycardic dysrhythmia #2 weakness #3 anemia      Micheal Dakin, MD 01/12/16 1919

## 2016-01-13 ENCOUNTER — Encounter (HOSPITAL_COMMUNITY): Payer: Self-pay | Admitting: General Practice

## 2016-01-13 ENCOUNTER — Encounter (HOSPITAL_COMMUNITY)
Admission: EM | Disposition: A | Discharge status: Home Health | Payer: Self-pay | Source: Home / Self Care | Attending: Internal Medicine

## 2016-01-13 DIAGNOSIS — F419 Anxiety disorder, unspecified: Secondary | ICD-10-CM | POA: Diagnosis present

## 2016-01-13 DIAGNOSIS — I481 Persistent atrial fibrillation: Secondary | ICD-10-CM | POA: Diagnosis present

## 2016-01-13 DIAGNOSIS — I519 Heart disease, unspecified: Secondary | ICD-10-CM

## 2016-01-13 DIAGNOSIS — I425 Other restrictive cardiomyopathy: Secondary | ICD-10-CM | POA: Diagnosis not present

## 2016-01-13 DIAGNOSIS — K219 Gastro-esophageal reflux disease without esophagitis: Secondary | ICD-10-CM | POA: Diagnosis present

## 2016-01-13 DIAGNOSIS — I441 Atrioventricular block, second degree: Secondary | ICD-10-CM | POA: Diagnosis present

## 2016-01-13 DIAGNOSIS — I440 Atrioventricular block, first degree: Secondary | ICD-10-CM | POA: Diagnosis present

## 2016-01-13 DIAGNOSIS — N400 Enlarged prostate without lower urinary tract symptoms: Secondary | ICD-10-CM | POA: Diagnosis present

## 2016-01-13 DIAGNOSIS — Z800 Family history of malignant neoplasm of digestive organs: Secondary | ICD-10-CM | POA: Diagnosis not present

## 2016-01-13 DIAGNOSIS — Z87891 Personal history of nicotine dependence: Secondary | ICD-10-CM | POA: Diagnosis not present

## 2016-01-13 DIAGNOSIS — I482 Chronic atrial fibrillation: Secondary | ICD-10-CM

## 2016-01-13 DIAGNOSIS — Z91018 Allergy to other foods: Secondary | ICD-10-CM | POA: Diagnosis not present

## 2016-01-13 DIAGNOSIS — Z801 Family history of malignant neoplasm of trachea, bronchus and lung: Secondary | ICD-10-CM | POA: Diagnosis not present

## 2016-01-13 DIAGNOSIS — D649 Anemia, unspecified: Secondary | ICD-10-CM | POA: Diagnosis present

## 2016-01-13 DIAGNOSIS — I480 Paroxysmal atrial fibrillation: Secondary | ICD-10-CM | POA: Diagnosis present

## 2016-01-13 DIAGNOSIS — I110 Hypertensive heart disease with heart failure: Secondary | ICD-10-CM | POA: Diagnosis present

## 2016-01-13 DIAGNOSIS — I442 Atrioventricular block, complete: Secondary | ICD-10-CM | POA: Diagnosis present

## 2016-01-13 DIAGNOSIS — Z7902 Long term (current) use of antithrombotics/antiplatelets: Secondary | ICD-10-CM | POA: Diagnosis not present

## 2016-01-13 DIAGNOSIS — I4510 Unspecified right bundle-branch block: Secondary | ICD-10-CM | POA: Diagnosis present

## 2016-01-13 DIAGNOSIS — I5042 Chronic combined systolic (congestive) and diastolic (congestive) heart failure: Secondary | ICD-10-CM | POA: Diagnosis present

## 2016-01-13 DIAGNOSIS — Z8249 Family history of ischemic heart disease and other diseases of the circulatory system: Secondary | ICD-10-CM | POA: Diagnosis not present

## 2016-01-13 DIAGNOSIS — R001 Bradycardia, unspecified: Secondary | ICD-10-CM | POA: Diagnosis present

## 2016-01-13 HISTORY — PX: EP IMPLANTABLE DEVICE: SHX172B

## 2016-01-13 LAB — BASIC METABOLIC PANEL
ANION GAP: 7 (ref 5–15)
BUN: 15 mg/dL (ref 6–20)
CALCIUM: 8.7 mg/dL — AB (ref 8.9–10.3)
CHLORIDE: 106 mmol/L (ref 101–111)
CO2: 23 mmol/L (ref 22–32)
Creatinine, Ser: 1.36 mg/dL — ABNORMAL HIGH (ref 0.61–1.24)
GFR calc non Af Amer: 43 mL/min — ABNORMAL LOW (ref >60)
GFR, EST AFRICAN AMERICAN: 50 mL/min — AB (ref >60)
Glucose, Bld: 95 mg/dL (ref 65–99)
Potassium: 4.3 mmol/L (ref 3.5–5.1)
Sodium: 136 mmol/L (ref 135–145)

## 2016-01-13 LAB — PROTIME-INR
INR: 1.64 — ABNORMAL HIGH (ref 0.00–1.49)
PROTHROMBIN TIME: 19.4 s — AB (ref 11.6–15.2)

## 2016-01-13 LAB — CBC
HCT: 29.7 % — ABNORMAL LOW (ref 39.0–52.0)
HEMOGLOBIN: 10.3 g/dL — AB (ref 13.0–17.0)
MCH: 30.6 pg (ref 26.0–34.0)
MCHC: 34.7 g/dL (ref 30.0–36.0)
MCV: 88.1 fL (ref 78.0–100.0)
Platelets: 209 10*3/uL (ref 150–400)
RBC: 3.37 MIL/uL — AB (ref 4.22–5.81)
RDW: 13.2 % (ref 11.5–15.5)
WBC: 3.9 10*3/uL — ABNORMAL LOW (ref 4.0–10.5)

## 2016-01-13 SURGERY — PACEMAKER IMPLANT
Anesthesia: LOCAL

## 2016-01-13 MED ORDER — SODIUM CHLORIDE 0.9 % IJ SOLN: 3.0000 mL | INTRAMUSCULAR | Status: DC | PRN

## 2016-01-13 MED ORDER — CEFAZOLIN SODIUM-DEXTROSE 2-3 GM-% IV SOLR
2.0000 g | INTRAVENOUS | Status: AC
  Administered 2016-01-13: 2 g via INTRAVENOUS

## 2016-01-13 MED ORDER — SODIUM CHLORIDE 0.9 % IR SOLN
Status: AC
  Filled 2016-01-13: qty 2

## 2016-01-13 MED ORDER — CEFAZOLIN SODIUM-DEXTROSE 2-3 GM-% IV SOLR
INTRAVENOUS | Status: AC
  Filled 2016-01-13: qty 50

## 2016-01-13 MED ORDER — HEPARIN (PORCINE) IN NACL 2-0.9 UNIT/ML-% IJ SOLN
INTRAMUSCULAR | Status: AC
  Filled 2016-01-13: qty 500

## 2016-01-13 MED ORDER — LIDOCAINE HCL (PF) 1 % IJ SOLN
INTRAMUSCULAR | Status: AC
  Filled 2016-01-13: qty 60

## 2016-01-13 MED ORDER — SODIUM CHLORIDE 0.9 % IJ SOLN
3.0000 mL | Freq: Two times a day (BID) | INTRAMUSCULAR | Status: DC
  Administered 2016-01-13: 3 mL via INTRAVENOUS

## 2016-01-13 MED ORDER — IOHEXOL 350 MG/ML SOLN
INTRAVENOUS | Status: DC | PRN
Start: 2016-01-13 — End: 2016-01-13
  Administered 2016-01-13: 15 mL via INTRA_ARTERIAL

## 2016-01-13 MED ORDER — SODIUM CHLORIDE 0.9 % IV SOLN: INTRAVENOUS | Status: DC

## 2016-01-13 MED ORDER — HYDROCODONE-ACETAMINOPHEN 5-325 MG PO TABS: 1.0000 | ORAL_TABLET | ORAL | Status: DC | PRN

## 2016-01-13 MED ORDER — HEPARIN (PORCINE) IN NACL 2-0.9 UNIT/ML-% IJ SOLN
INTRAMUSCULAR | Status: DC | PRN
  Administered 2016-01-13: 16:00:00

## 2016-01-13 MED ORDER — SODIUM CHLORIDE 0.9 % IV SOLN: 250.0000 mL | INTRAVENOUS | Status: DC | PRN

## 2016-01-13 MED ORDER — CEFAZOLIN SODIUM 1-5 GM-% IV SOLN
1.0000 g | Freq: Two times a day (BID) | INTRAVENOUS | Status: DC
  Administered 2016-01-14: 1 g via INTRAVENOUS
  Filled 2016-01-13 (×2): qty 50

## 2016-01-13 MED ORDER — ACETAMINOPHEN 325 MG PO TABS
325.0000 mg | ORAL_TABLET | ORAL | Status: DC | PRN
  Administered 2016-01-13 – 2016-01-14 (×2): 650 mg via ORAL
  Filled 2016-01-13 (×3): qty 2

## 2016-01-13 MED ORDER — ONDANSETRON HCL 4 MG/2ML IJ SOLN: 4.0000 mg | Freq: Four times a day (QID) | INTRAMUSCULAR | Status: DC | PRN

## 2016-01-13 MED ORDER — CHLORHEXIDINE GLUCONATE 4 % EX LIQD: 60.0000 mL | Freq: Once | CUTANEOUS | Status: DC

## 2016-01-13 MED ORDER — SODIUM CHLORIDE 0.9 % IR SOLN
80.0000 mg | Status: AC
  Administered 2016-01-13: 80 mg

## 2016-01-13 SURGICAL SUPPLY — 9 items
CABLE SURGICAL S-101-97-12 (CABLE) ×1 IMPLANT
LEAD TENDRIL SDX 2088TC-58CM (Lead) ×1 IMPLANT
PACK EP LATEX FREE (CUSTOM PROCEDURE TRAY)
PACK EP LF (CUSTOM PROCEDURE TRAY) IMPLANT
PAD DEFIB LIFELINK (PAD) ×1 IMPLANT
PPM ASSURITY SR PM1240 (Pacemaker) ×1 IMPLANT
SHEATH CLASSIC 7F (SHEATH) ×1 IMPLANT
TRAY PACEMAKER INSERTION (PACKS) ×1 IMPLANT
WIRE HI TORQ VERSACORE-J 145CM (WIRE) ×1 IMPLANT

## 2016-01-13 NOTE — Interval H&P Note (Signed)
History and Physical Interval Note:  01/13/2016 2:23 PM  Micheal Nicholson  has presented today for surgery, with the diagnosis of bradycardia  The various methods of treatment have been discussed with the patient and family. After consideration of risks, benefits and other options for treatment, the patient has consented to  Procedure(s): Pacemaker Implant (N/A) as a surgical intervention .  The patient's history has been reviewed, patient examined, no change in status, stable for surgery.  I have reviewed the patient's chart and labs.  Questions were answered to the patient's satisfaction.     Thompson Grayer

## 2016-01-13 NOTE — Progress Notes (Signed)
[facility]  Patient Status: Active (receiving services up to time of hospitalization)  [facility] is providing the following services: RN and PT  If patient discharges after hours, please call [phone].   Micheal Nicholson 01/13/2016, 11:35 AM

## 2016-01-13 NOTE — Consult Note (Signed)
ELECTROPHYSIOLOGY CONSULT NOTE    Patient ID: Micheal Nicholson MRN: [id number], DOB/AGE: 1920-06-18 82 y.o.  Admit date: 01/12/2016 Date of Consult: 01/13/2016   Primary Physician: Micheal Post, MD Primary Cardiologist: Dr. [name]  Reason for Consultation: bradycardia, evaluate for possible PPM  HPI: Micheal Nicholson is a 82 y.o. male admitted to [facility] 01/12/16 for recurrent/generalized weakness.  He has PMHx of prior very long first degree AV block, persistent Afib, chronic systolic CHF, HTN.  He was referred to [facility] ER 12/30/15 by his PMD for hypotension and bradycardia with rates 30's-40's and SBP 88, his BB was stopped, and felt as well to be dehydrated and his medicines adjusted his HR remained 40's at rest with 50-60's with ambulation and asymptomatic and discharged 01/03/16.  He reported that he had not been eating well at all, not feeling like he had any energy to do anything prior to going in to the hospital.  He was seen by EP service/Dr. Curt Bears as well and given improvement in his HR and improved symptoms no PPM was recommended.  He initially reported feeling better but developed weakness, again no energy.  Reports that for a couple days he could walk back and forth to the mailbox including a small incline and felt pretty good, but after 2-3 days again at home, unable to do much of anything feeling weak, no CP, no SOB, no near syncope or syncope.  He had a home RN visit who apparently found his HR 35-38 with BP 126/64 at home and given this he was advised to return to the ER.   This morning he feels hungry, otherwise feeling well.   Past Medical History  Diagnosis Date  . Hypertension   . Anxiety   . BPH (benign prostatic hyperplasia)   . Chronic combined systolic and diastolic CHF, NYHA class 2 ([place]) 04/17/2015  . CHF (congestive heart failure) ([place])   . Symptomatic sinus bradycardia     WEAKNESS & HYPOTENSION  . GERD (gastroesophageal reflux disease)   . Arthritis        Surgical History:  Past Surgical History  Procedure Laterality Date  . Cardiac catheterization  2006    Negative / 4 years ago     Prescriptions prior to admission  Medication Sig Dispense Refill Last Dose  . ALPRAZolam (XANAX) 0.5 MG tablet TAKE 1 TABLET TWICE A DAY AS NEEDED (Patient taking differently: TAKE 1 TABLET TWICE A DAY AS NEEDED FOR NERVES) 60 tablet 2 Past Month at Unknown time  . apixaban (ELIQUIS) 5 MG TABS tablet Take 1 tablet (5 mg total) by mouth 2 (two) times daily. 60 tablet 0 01/12/2016 at am  . Camphor-Eucalyptus-Menthol (VICKS VAPORUB EX) Apply 1 application topically as needed (to help breathe).   Past Month at Unknown time  . Cholecalciferol (VITAMIN D PO) Take 1 tablet by mouth daily.   01/12/2016 at Unknown time  . Dutasteride-Tamsulosin HCl 0.5-0.4 MG CAPS TAKE 1 CAPSULE BY MOUTH DAILY 90 capsule 3 01/12/2016 at Unknown time  . fluticasone (FLONASE) 50 MCG/ACT nasal spray Place 2 sprays into both nostrils 2 (two) times daily.    01/12/2016 at am  . furosemide (LASIX) 40 MG tablet Take 1 tablet (40 mg total) by mouth daily. 60 tablet 11 01/12/2016 at Unknown time  . guaiFENesin (MUCINEX) 600 MG 12 hr tablet Take 1 tablet (600 mg total) by mouth 2 (two) times daily. 30 tablet 0 01/12/2016 at am  . KLOR-CON M10 10 MEQ tablet Take 1  tablet by mouth daily.   01/12/2016 at Unknown time  . lisinopril (PRINIVIL,ZESTRIL) 5 MG tablet Take 1 tablet (5 mg total) by mouth daily. 30 tablet 0 01/12/2016 at Unknown time  . omeprazole (PRILOSEC) 20 MG capsule TAKE ONE CAPSULE BY MOUTH EVERY DAY 30 capsule 5 01/12/2016 at Unknown time  . pramipexole (MIRAPEX) 0.125 MG tablet Take 1 tablet (0.125 mg total) by mouth at bedtime. 30 tablet 5 01/11/2016 at Unknown time  . sertraline (ZOLOFT) 100 MG tablet TAKE 1.5 TABLETS BY MOUTH DAILY (Patient taking differently: TAKE 1.5 TABLETS (150mg ) BY MOUTH DAILY) 45 tablet 3 01/12/2016 at Unknown time  . Vitamin D, Ergocalciferol, (DRISDOL) 50000  UNITS CAPS capsule Take 1 capsule (50,000 Units total) by mouth every 7 (seven) days. 30 capsule 0 01/12/2016 at Unknown time    Inpatient Medications:  . apixaban  5 mg Oral BID  . dutasteride  0.5 mg Oral Daily   And  . tamsulosin  0.4 mg Oral q morning - 10a  . fluticasone  2 spray Each Nare BID  . furosemide  40 mg Oral Daily  . lisinopril  5 mg Oral Daily  . pantoprazole  40 mg Oral Daily  . potassium chloride  10 mEq Oral Daily  . pramipexole  0.125 mg Oral QHS  . sertraline  100 mg Oral Daily    Allergies:  Allergies  Allergen Reactions  . Chocolate Other (See Comments)    Acid reflux    Social History   Social History  . Marital Status: Widowed    Spouse Name: N/A  . Number of Children: N/A  . Years of Education: N/A   Occupational History  . Not on file.   Social History Main Topics  . Smoking status: Former Smoker -- 0.50 packs/day for 5 years  . Smokeless tobacco: Never Used  . Alcohol Use: Yes     Comment: occ  . Drug Use: No  . Sexual Activity: Not on file   Other Topics Concern  . Not on file   Social History Narrative     Family History  Problem Relation Age of Onset  . Pneumonia Father 64    died  . Lung cancer Brother   . Heart disease Brother   . Pancreatic cancer Son   . Healthy Son   . Heart failure Son      Review of Systems: All other systems reviewed and are otherwise negative except as noted above.  Physical Exam: Filed Vitals:   01/12/16 1930 01/12/16 2017 01/12/16 2034 01/13/16 0347  BP: 160/78 160/74 158/74 133/71  Pulse: 82 42 44 46  Temp:   97.8 F (36.6 C) 98 F (36.7 C)  TempSrc:   Axillary Oral  Resp: 19 25 20 15   Height:   5\' 10"  (1.778 m)   Weight:   167 lb 4.8 oz (75.887 kg) 169 lb (76.658 kg)  SpO2: 95% 93% 95% 95%   GEN- The patient is well appearing, alert and oriented x 3 today.   HEENT: normocephalic, atraumatic; sclera clear, conjunctiva pink; hearing intact; oropharynx clear; neck supple, no  JVP Lymph- no cervical lymphadenopathy Lungs- Clear to ausculation bilaterally, normal work of breathing.  No wheezes, rales, rhonchi Heart- irregular rate and rhythm, no murmurs, rubs or gallops, PMI not laterally displaced GI- soft, non-tender, non-distended Extremities- no clubbing, cyanosis, or edema MS- no significant deformity, age appropriate atrophy Skin- warm and dry, no rash or lesion Psych- euthymic mood, full affect Neuro- no gross  deficits observed  Labs:   Lab Results  Component Value Date   WBC 3.9* 01/13/2016   HGB 10.3* 01/13/2016   HCT 29.7* 01/13/2016   MCV 88.1 01/13/2016   PLT 209 01/13/2016    Recent Labs Lab 01/13/16 0328  NA 136  K 4.3  CL 106  CO2 23  BUN 15  CREATININE 1.36*  CALCIUM 8.7*  GLUCOSE 95      Radiology/Studies:  Dg Chest Portable 1 View 12/29/2015  CLINICAL DATA:  Bradycardia, weakness, cough EXAM: PORTABLE CHEST 1 VIEW COMPARISON:  03/14/2015 FINDINGS: Chronic cardiopericardial enlargement and aortic tortuosity. Stable upper mediastinal widening, likely ectatic vessels. Interstitial coarsening which is at the patient's baseline. No suspected pulmonary edema. No pneumonia, effusion, or pneumothorax. IMPRESSION: Chronic findings.  No evidence of acute cardiopulmonary disease. Electronically Signed   By: Monte Fantasia M.D.   On: 12/29/2015 09:57    EKG: Afib, RBBB, LAD, inf/lat T changes (appear new) 41bpm  TELEMETRY: Afib, generally 40's noting some rates 38-40, 50's as well  09/23/14: stress myoview Impression Exercise Capacity: Lexiscan with no exercise. BP Response: Normal blood pressure response. Clinical Symptoms: No significant symptoms noted. ECG Impression: No significant ST segment change suggestive of ischemia. Comparison with Prior Nuclear Study: No significant change from previous study -Overall Impression: Low risk stress nuclear study a small region of apical lateral inferior scar without ischemia.  As per  record notes:  2006, he underwent a stress myocardial perfusion study that suggested an inferior wall scar and he underwent coronary angiography. He had a 30% narrowing in the proximal to mid LAD artery following the takeoff of the first septal and first diagonal branches but no other coronary lesions  09/07/14: Echocardiogram Study Conclusions - Left ventricle: The cavity size was normal. Wall thickness was normal. Systolic function was moderately to severely reduced. The estimated ejection fraction was in the range of 30% to 35%. - Aortic valve: There was mild regurgitation. - Mitral valve: There was mild regurgitation. - Right ventricle: The cavity size was mildly dilated. Systolic function was mildly reduced. - Right atrium: The atrium was mildly to moderately dilated.  Assessment and Plan:  1. Bradycardia     Conduction system disease, trifasicular block     Off BB since 12/29/15     Discussed with the patient his generalized fatigue may be multifactorial (advanced age, CM), though given his HR continues to get into the 38-40bpm range, and often 40's he may benefit from [facility] implant.  2. CM, CHF, combined diastolic/systolic      Dd/w cardiology, Dr. Harrington Challenger, this has been managed conservatively given his age      70 compensated      On ACE, off BB with bradycardia       Would repeat echo  3. Persistent Afib     CHADS2Vasc is at least 6 (with stroke on MRI/asymptomatic) on Eliquis  4. HTN   Signed, Tommye Standard, PA-C 01/13/2016 10:04 AM   I have seen, examined the patient, and reviewed the above assessment and plan.  On exam, pleasant, bradycardic irregular rhythm.  Changes to above are made where necessary.  The patient has symptomatic bradycardia (second degree AV block in the setting of persistent and probably permanent afib).  I would therefore recommend pacemaker implantation at this time.  Does not have any real CHF symptoms presently.  No recent echo--> will update.   Given advanced age, would not advise LV lead.  Given prolonged AF with minimal symptoms and  no real plans for cardioversion, I do not think that he will require an atrial lead.   Risks, benefits, alternatives to pacemaker implantation were discussed in detail with the patient today. The patient understands that the risks include but are not limited to bleeding, infection, pneumothorax, perforation, tamponade, vascular damage, renal failure, MI, stroke, death,  and lead dislodgement and wishes to proceed.  We discussed a palliative routes as an alternative.  He is clear that he would prefer to proceed.  We will therefore schedule the procedure at the next available time.  Given advanced age, comoribidities a very complex decision making was required for this discussion today   Co Sign: Thompson Grayer, MD 01/13/2016 2:12 PM

## 2016-01-13 NOTE — H&P (View-Only) (Signed)
ELECTROPHYSIOLOGY CONSULT NOTE    Patient ID: Micheal Nicholson MRN: [id number], DOB/AGE: 1920/01/15 82 y.o.  Admit date: 01/12/2016 Date of Consult: 01/13/2016   Primary Physician: [name], MD Primary Cardiologist: Dr. [name]  Reason for Consultation: bradycardia, evaluate for possible PPM  HPI: Micheal Nicholson is a 82 y.o. male admitted to [facility] 01/12/16 for recurrent/generalized weakness.  He has PMHx of prior very long first degree AV block, persistent Afib, chronic systolic CHF, HTN.  He was referred to [facility] ER 12/30/15 by his PMD for hypotension and bradycardia with rates 30's-40's and SBP 88, his BB was stopped, and felt as well to be dehydrated and his medicines adjusted his HR remained 40's at rest with 50-60's with ambulation and asymptomatic and discharged 01/03/16.  He reported that he had not been eating well at all, not feeling like he had any energy to do anything prior to going in to the hospital.  He was seen by EP service/Dr. Curt Bears as well and given improvement in his HR and improved symptoms no PPM was recommended.  He initially reported feeling better but developed weakness, again no energy.  Reports that for a couple days he could walk back and forth to the mailbox including a small incline and felt pretty good, but after 2-3 days again at home, unable to do much of anything feeling weak, no CP, no SOB, no near syncope or syncope.  He had a home RN visit who apparently found his HR 35-38 with BP 126/64 at home and given this he was advised to return to the ER.   This morning he feels hungry, otherwise feeling well.   Past Medical History  Diagnosis Date  . Hypertension   . Anxiety   . BPH (benign prostatic hyperplasia)   . Chronic combined systolic and diastolic CHF, NYHA class 2 ([place]) 04/17/2015  . CHF (congestive heart failure) ([place])   . Symptomatic sinus bradycardia     WEAKNESS & HYPOTENSION  . GERD (gastroesophageal reflux disease)   . Arthritis        Surgical History:  Past Surgical History  Procedure Laterality Date  . Cardiac catheterization  2006    Negative / 4 years ago     Prescriptions prior to admission  Medication Sig Dispense Refill Last Dose  . ALPRAZolam (XANAX) 0.5 MG tablet TAKE 1 TABLET TWICE A DAY AS NEEDED (Patient taking differently: TAKE 1 TABLET TWICE A DAY AS NEEDED FOR NERVES) 60 tablet 2 Past Month at Unknown time  . apixaban (ELIQUIS) 5 MG TABS tablet Take 1 tablet (5 mg total) by mouth 2 (two) times daily. 60 tablet 0 01/12/2016 at am  . Camphor-Eucalyptus-Menthol (VICKS VAPORUB EX) Apply 1 application topically as needed (to help breathe).   Past Month at Unknown time  . Cholecalciferol (VITAMIN D PO) Take 1 tablet by mouth daily.   01/12/2016 at Unknown time  . Dutasteride-Tamsulosin HCl 0.5-0.4 MG CAPS TAKE 1 CAPSULE BY MOUTH DAILY 90 capsule 3 01/12/2016 at Unknown time  . fluticasone (FLONASE) 50 MCG/ACT nasal spray Place 2 sprays into both nostrils 2 (two) times daily.    01/12/2016 at am  . furosemide (LASIX) 40 MG tablet Take 1 tablet (40 mg total) by mouth daily. 60 tablet 11 01/12/2016 at Unknown time  . guaiFENesin (MUCINEX) 600 MG 12 hr tablet Take 1 tablet (600 mg total) by mouth 2 (two) times daily. 30 tablet 0 01/12/2016 at am  . KLOR-CON M10 10 MEQ tablet Take 1  tablet by mouth daily.   01/12/2016 at Unknown time  . lisinopril (PRINIVIL,ZESTRIL) 5 MG tablet Take 1 tablet (5 mg total) by mouth daily. 30 tablet 0 01/12/2016 at Unknown time  . omeprazole (PRILOSEC) 20 MG capsule TAKE ONE CAPSULE BY MOUTH EVERY DAY 30 capsule 5 01/12/2016 at Unknown time  . pramipexole (MIRAPEX) 0.125 MG tablet Take 1 tablet (0.125 mg total) by mouth at bedtime. 30 tablet 5 01/11/2016 at Unknown time  . sertraline (ZOLOFT) 100 MG tablet TAKE 1.5 TABLETS BY MOUTH DAILY (Patient taking differently: TAKE 1.5 TABLETS (150mg ) BY MOUTH DAILY) 45 tablet 3 01/12/2016 at Unknown time  . Vitamin D, Ergocalciferol, (DRISDOL) 50000  UNITS CAPS capsule Take 1 capsule (50,000 Units total) by mouth every 7 (seven) days. 30 capsule 0 01/12/2016 at Unknown time    Inpatient Medications:  . apixaban  5 mg Oral BID  . dutasteride  0.5 mg Oral Daily   And  . tamsulosin  0.4 mg Oral q morning - 10a  . fluticasone  2 spray Each Nare BID  . furosemide  40 mg Oral Daily  . lisinopril  5 mg Oral Daily  . pantoprazole  40 mg Oral Daily  . potassium chloride  10 mEq Oral Daily  . pramipexole  0.125 mg Oral QHS  . sertraline  100 mg Oral Daily    Allergies:  Allergies  Allergen Reactions  . Chocolate Other (See Comments)    Acid reflux    Social History   Social History  . Marital Status: Widowed    Spouse Name: N/A  . Number of Children: N/A  . Years of Education: N/A   Occupational History  . Not on file.   Social History Main Topics  . Smoking status: Former Smoker -- 0.50 packs/day for 5 years  . Smokeless tobacco: Never Used  . Alcohol Use: Yes     Comment: occ  . Drug Use: No  . Sexual Activity: Not on file   Other Topics Concern  . Not on file   Social History Narrative     Family History  Problem Relation Age of Onset  . Pneumonia Father 26    died  . Lung cancer Brother   . Heart disease Brother   . Pancreatic cancer Son   . Healthy Son   . Heart failure Son      Review of Systems: All other systems reviewed and are otherwise negative except as noted above.  Physical Exam: Filed Vitals:   01/12/16 1930 01/12/16 2017 01/12/16 2034 01/13/16 0347  BP: 160/78 160/74 158/74 133/71  Pulse: 82 42 44 46  Temp:   97.8 F (36.6 C) 98 F (36.7 C)  TempSrc:   Axillary Oral  Resp: 19 25 20 15   Height:   5\' 10"  (1.778 m)   Weight:   167 lb 4.8 oz (75.887 kg) 169 lb (76.658 kg)  SpO2: 95% 93% 95% 95%   GEN- The patient is well appearing, alert and oriented x 3 today.   HEENT: normocephalic, atraumatic; sclera clear, conjunctiva pink; hearing intact; oropharynx clear; neck supple, no  JVP Lymph- no cervical lymphadenopathy Lungs- Clear to ausculation bilaterally, normal work of breathing.  No wheezes, rales, rhonchi Heart- irregular rate and rhythm, no murmurs, rubs or gallops, PMI not laterally displaced GI- soft, non-tender, non-distended Extremities- no clubbing, cyanosis, or edema MS- no significant deformity, age appropriate atrophy Skin- warm and dry, no rash or lesion Psych- euthymic mood, full affect Neuro- no gross  deficits observed  Labs:   Lab Results  Component Value Date   WBC 3.9* 01/13/2016   HGB 10.3* 01/13/2016   HCT 29.7* 01/13/2016   MCV 88.1 01/13/2016   PLT 209 01/13/2016    Recent Labs Lab 01/13/16 0328  NA 136  K 4.3  CL 106  CO2 23  BUN 15  CREATININE 1.36*  CALCIUM 8.7*  GLUCOSE 95      Radiology/Studies:  Dg Chest Portable 1 View 12/29/2015  CLINICAL DATA:  Bradycardia, weakness, cough EXAM: PORTABLE CHEST 1 VIEW COMPARISON:  03/14/2015 FINDINGS: Chronic cardiopericardial enlargement and aortic tortuosity. Stable upper mediastinal widening, likely ectatic vessels. Interstitial coarsening which is at the patient's baseline. No suspected pulmonary edema. No pneumonia, effusion, or pneumothorax. IMPRESSION: Chronic findings.  No evidence of acute cardiopulmonary disease. Electronically Signed   By: Monte Fantasia M.D.   On: 12/29/2015 09:57    EKG: Afib, RBBB, LAD, inf/lat T changes (appear new) 41bpm  TELEMETRY: Afib, generally 40's noting some rates 38-40, 50's as well  09/23/14: stress myoview Impression Exercise Capacity: Lexiscan with no exercise. BP Response: Normal blood pressure response. Clinical Symptoms: No significant symptoms noted. ECG Impression: No significant ST segment change suggestive of ischemia. Comparison with Prior Nuclear Study: No significant change from previous study -Overall Impression: Low risk stress nuclear study a small region of apical lateral inferior scar without ischemia.  As per  record notes:  2006, he underwent a stress myocardial perfusion study that suggested an inferior wall scar and he underwent coronary angiography. He had a 30% narrowing in the proximal to mid LAD artery following the takeoff of the first septal and first diagonal branches but no other coronary lesions  09/07/14: Echocardiogram Study Conclusions - Left ventricle: The cavity size was normal. Wall thickness was normal. Systolic function was moderately to severely reduced. The estimated ejection fraction was in the range of 30% to 35%. - Aortic valve: There was mild regurgitation. - Mitral valve: There was mild regurgitation. - Right ventricle: The cavity size was mildly dilated. Systolic function was mildly reduced. - Right atrium: The atrium was mildly to moderately dilated.  Assessment and Plan:  1. Bradycardia     Conduction system disease, trifasicular block     Off BB since 12/29/15     Discussed with the patient his generalized fatigue may be multifactorial (advanced age, CM), though given his HR continues to get into the 38-40bpm range, and often 40's he may benefit from [facility] implant.  2. CM, CHF, combined diastolic/systolic      Dd/w cardiology, Dr. Harrington Challenger, this has been managed conservatively given his age      65 compensated      On ACE, off BB with bradycardia       Would repeat echo  3. Persistent Afib     CHADS2Vasc is at least 6 (with stroke on MRI/asymptomatic) on Eliquis  4. HTN   Signed, Tommye Standard, PA-C 01/13/2016 10:04 AM   I have seen, examined the patient, and reviewed the above assessment and plan.  On exam, pleasant, bradycardic irregular rhythm.  Changes to above are made where necessary.  The patient has symptomatic bradycardia (second degree AV block in the setting of persistent and probably permanent afib).  I would therefore recommend pacemaker implantation at this time.  Does not have any real CHF symptoms presently.  No recent echo--> will update.   Given advanced age, would not advise LV lead.  Given prolonged AF with minimal symptoms and  no real plans for cardioversion, I do not think that he will require an atrial lead.   Risks, benefits, alternatives to pacemaker implantation were discussed in detail with the patient today. The patient understands that the risks include but are not limited to bleeding, infection, pneumothorax, perforation, tamponade, vascular damage, renal failure, MI, stroke, death,  and lead dislodgement and wishes to proceed.  We discussed a palliative routes as an alternative.  He is clear that he would prefer to proceed.  We will therefore schedule the procedure at the next available time.  Given advanced age, comoribidities a very complex decision making was required for this discussion today   Co Sign: Thompson Grayer, MD 01/13/2016 2:12 PM

## 2016-01-13 NOTE — Plan of Care (Signed)
Problem: Safety: Goal: Ability to remain free from injury will improve Outcome: Progressing Patient educated about his risk for syncope related to his low heart rate. Patient understands the need to call staff before getting out of bed. Bed alarm is in use; call bell is within reach  Problem: Pain Managment: Goal: General experience of comfort will improve Outcome: Completed/Met Date Met:  01/13/16 Patient has had no complaints of pain.  Problem: Tissue Perfusion: Goal: Risk factors for ineffective tissue perfusion will decrease Outcome: Completed/Met Date Met:  01/13/16 Patient receives Eliquis and is ambulatory; he is therefore a low risk for VTE.  Problem: Activity: Goal: Risk for activity intolerance will decrease Outcome: Not Progressing Patient has been experiencing fatigue related to his low heart rate.

## 2016-01-13 NOTE — Progress Notes (Signed)
Subjective: No CP  No SOB at rest   Objective: Filed Vitals:   01/12/16 1930 01/12/16 2017 01/12/16 2034 01/13/16 0347  BP: 160/78 160/74 158/74 133/71  Pulse: 82 42 44 46  Temp:   97.8 F (36.6 C) 98 F (36.7 C)  TempSrc:   Axillary Oral  Resp: 19 25 20 15   Height:   5\' 10"  (1.778 m)   Weight:   75.887 kg (167 lb 4.8 oz) 76.658 kg (169 lb)  SpO2: 95% 93% 95% 95%   Weight change:   Intake/Output Summary (Last 24 hours) at 01/13/16 1009 Last data filed at 01/13/16 0743  Gross per 24 hour  Intake    240 ml  Output    750 ml  Net   -510 ml    General: Alert, awake, oriented x3, in no acute distress Neck:  JVP is normal Heart: Regular rate and rhythm, without murmurs, rubs, gallops.  Lungs: Clear to auscultation.  No rales or wheezes. Exemities:  No edema.   Neuro: Grossly intact, nonfocal.  Tele  Atrial fib 40s   Lab Results: Results for orders placed or performed during the hospital encounter of 01/12/16 (from the past 24 hour(s))  Basic metabolic panel     Status: Abnormal   Collection Time: 01/12/16  4:35 PM  Result Value Ref Range   Sodium 137 135 - 145 mmol/L   Potassium 4.6 3.5 - 5.1 mmol/L   Chloride 102 101 - 111 mmol/L   CO2 25 22 - 32 mmol/L   Glucose, Bld 105 (H) 65 - 99 mg/dL   BUN 14 6 - 20 mg/dL   Creatinine, Ser 1.40 (H) 0.61 - 1.24 mg/dL   Calcium 9.1 8.9 - 10.3 mg/dL   GFR calc non Af Amer 42 (L) >60 mL/min   GFR calc Af Amer 48 (L) >60 mL/min   Anion gap 10 5 - 15  CBC     Status: Abnormal   Collection Time: 01/12/16  4:35 PM  Result Value Ref Range   WBC 4.3 4.0 - 10.5 K/uL   RBC 3.77 (L) 4.22 - 5.81 MIL/uL   Hemoglobin 11.3 (L) 13.0 - 17.0 g/dL   HCT 33.5 (L) 39.0 - 52.0 %   MCV 88.9 78.0 - 100.0 fL   MCH 30.0 26.0 - 34.0 pg   MCHC 33.7 30.0 - 36.0 g/dL   RDW 13.3 11.5 - 15.5 %   Platelets 232 150 - 400 K/uL  I-stat troponin, ED (not at [facility], [facility])     Status: None   Collection Time: 01/12/16  4:47 PM  Result Value Ref Range   Troponin i, poc 0.02 0.00 - 0.08 ng/mL   Comment 3          Basic metabolic panel     Status: Abnormal   Collection Time: 01/13/16  3:28 AM  Result Value Ref Range   Sodium 136 135 - 145 mmol/L   Potassium 4.3 3.5 - 5.1 mmol/L   Chloride 106 101 - 111 mmol/L   CO2 23 22 - 32 mmol/L   Glucose, Bld 95 65 - 99 mg/dL   BUN 15 6 - 20 mg/dL   Creatinine, Ser 1.36 (H) 0.61 - 1.24 mg/dL   Calcium 8.7 (L) 8.9 - 10.3 mg/dL   GFR calc non Af Amer 43 (L) >60 mL/min   GFR calc Af Amer 50 (L) >60 mL/min   Anion gap 7 5 - 15  CBC     Status: Abnormal  Collection Time: 01/13/16  3:28 AM  Result Value Ref Range   WBC 3.9 (L) 4.0 - 10.5 K/uL   RBC 3.37 (L) 4.22 - 5.81 MIL/uL   Hemoglobin 10.3 (L) 13.0 - 17.0 g/dL   HCT 29.7 (L) 39.0 - 52.0 %   MCV 88.1 78.0 - 100.0 fL   MCH 30.6 26.0 - 34.0 pg   MCHC 34.7 30.0 - 36.0 g/dL   RDW 13.2 11.5 - 15.5 %   Platelets 209 150 - 400 K/uL  Protime-INR     Status: Abnormal   Collection Time: 01/13/16  3:28 AM  Result Value Ref Range   Prothrombin Time 19.4 (H) 11.6 - 15.2 seconds   INR 1.64 (H) 0.00 - 1.49    Studies/Results: No results found.  Medications:REviewed   @PROBHOSP @  1  Bradycardia  HR rel stable  I have discussed with M Croituru  Will ask EP to see.    2  Chronic systolic CHF  Voljme status is OK    3.  HTN    BP is OK, actually a little high  NO changes      Dorris Carnes 01/13/2016, 10:09 AM

## 2016-01-14 ENCOUNTER — Inpatient Hospital Stay (HOSPITAL_COMMUNITY): Payer: Commercial Managed Care - HMO

## 2016-01-14 ENCOUNTER — Encounter (HOSPITAL_COMMUNITY): Payer: Self-pay | Admitting: Internal Medicine

## 2016-01-14 DIAGNOSIS — I425 Other restrictive cardiomyopathy: Secondary | ICD-10-CM

## 2016-01-14 LAB — BASIC METABOLIC PANEL
ANION GAP: 8 (ref 5–15)
BUN: 12 mg/dL (ref 6–20)
CALCIUM: 8.7 mg/dL — AB (ref 8.9–10.3)
CO2: 24 mmol/L (ref 22–32)
CREATININE: 1.37 mg/dL — AB (ref 0.61–1.24)
Chloride: 105 mmol/L (ref 101–111)
GFR, EST AFRICAN AMERICAN: 50 mL/min — AB (ref >60)
GFR, EST NON AFRICAN AMERICAN: 43 mL/min — AB (ref >60)
Glucose, Bld: 96 mg/dL (ref 65–99)
Potassium: 4.1 mmol/L (ref 3.5–5.1)
SODIUM: 137 mmol/L (ref 135–145)

## 2016-01-14 MED ORDER — CARVEDILOL 3.125 MG PO TABS: 3.1250 mg | ORAL_TABLET | Freq: Two times a day (BID) | ORAL | Status: DC

## 2016-01-14 NOTE — Clinical Documentation Improvement (Signed)
Cardiology  Can the diagnosis of anemia be further specified? This diagnosis found in ED progress note. Please document findings in next progress note; NOT in BPA drop down box. Thanks!   Iron deficiency Anemia  Nutritional anemia, including the nutrition or mineral deficits  Chronic Blood Loss Anemia, including the suspected or known cause  Anemia of chronic disease, including the associated chronic disease state  Other  Clinically Undetermined  Document any associated diagnoses/conditions.  Supporting Information:  H&H 10/29.7 on admission  Please exercise your independent, professional judgment when responding. A specific answer is not anticipated or expected.  Thank You,  Zoila Shutter RN, BSN, [place] [phone]; Cell: [phone]

## 2016-01-14 NOTE — Discharge Summary (Signed)
ELECTROPHYSIOLOGY PROCEDURE DISCHARGE SUMMARY    Patient ID: Micheal Nicholson,  MRN: [id number], DOB/AGE: 80-Oct-1921 80 y.o.  Admit date: 01/12/2016 Discharge date: 01/14/2016  Primary Care Physician: [name], MD Primary Cardiologist: Dr. [name] Electrophysiologist: Dr. Rayann Heman  Primary Discharge Diagnosis:  Symptomatic bradycardia status post pacemaker implantation this admission  Secondary Discharge Diagnosis:  1.  HTN 2.  Perisitant AFib      CHADS2Vasc is at least 5 on Eliquis 3. chronic CHF (mixed)     Appears well compensated  Allergies  Allergen Reactions  . Chocolate Other (See Comments)    Acid reflux     Procedures This Admission:  1.  Implantation of a STJ single chamber PPM on 01/13/16 by Dr Rayann Heman.  The patient received a [place] [id number] (serial number [id number] )  with model number [place] [id number] (serial number [id number]) right ventricular lead. There were no immediate post procedure complications. 2.  CXR on 01/14/16 demonstrated no pneumothorax status post device implantation.    Brief HPI: Micheal Nicholson is a 82 y.o. male was referred to the ER secondary to significant bradycardia and symptoms of weakness.  The patient has had symptomatic bradycardia without reversible causes identified.  Risks, benefits, and alternatives to PPM implantation were reviewed with the patient who wished to proceed.   Hospital Course:  Micheal Nicholson is a 82 y.o. male admitted to [facility] 01/12/16 for recurrent/generalized weakness. He has PMHx of prior very long first degree AV block, persistent Afib, chronic systolic CHF, HTN. He was referred to [facility] ER 12/30/15 by his PMD for hypotension and bradycardia with rates 30's-40's and SBP 88, his BB was stopped, and felt as well to be dehydrated and his medicines adjusted his HR remained 40's at rest with 50-60's with ambulation and asymptomatic and discharged 01/03/16.  He reported that he had not been eating well at all, not feeling like he had any energy to do anything prior to going in to the hospital. He was seen by EP service/Dr. Curt Bears as well and given improvement in his HR and improved symptoms no PPM was recommended. He initially reported feeling better but developed weakness, again no energy. Reports that for a couple days he could walk back and forth to the mailbox including a small incline and felt pretty good, but after 2-3 days again at home, unable to do much of anything feeling weak, no CP, no SOB, no near syncope or syncope. He had a home RN visit who apparently found his HR 35-38 with BP 126/64 at home and given this he was advised to return to the ER. He was observed to have AF with peristent bradycardia generally in the 40's, with rates dipping in to the 38bpm range and as high 50's  He underwent implantation of a PPM with details as outlined above.  He  was monitored on telemetry overnight which demonstrated V paced rhythm, occasional intrinsic beats.  Left chest was without hematoma or ecchymosis.  The device was interrogated and found to be functioning normally.  CXR was obtained and demonstrated no pneumothorax status post device implantation.  The patient feels well this morning, only minimal PPM site discomfort, no CP, palpitations or SOB.  Wound care, arm mobility, and restrictions were reviewed with the patient.  The patient was examined by Dr. Rayann Heman and considered stable for discharge to home. We will resume his Eliquis tomorrow, add Coreg 3.125 mg BID.  F/u wound check  has been scheduled.  He has a visit scheduled with Dr. [name] as well already scheduled.  He was gettinh [facility] previously, we he will resume those services.  Will f/u his echo result out patient.   Physical Exam: Filed Vitals:   01/13/16 1650 01/13/16 1705 01/13/16 1920 01/14/16 0220  BP: 150/82 135/78 125/70 135/82  Pulse: 65 64 70 63  Temp:   98 F (36.7 C)   TempSrc:    Oral   Resp: 25 26 22 25   Height:      Weight:      SpO2: 96% 93% 97% 91%    GEN- The patient is well appearing, alert and oriented x 3 today.   HEENT: normocephalic, atraumatic; sclera clear, conjunctiva pink; hearing intact; oropharynx clear; neck supple, no JVP Lungs- Clear to ausculation bilaterally, normal work of breathing.  No wheezes, rales, rhonchi Heart- Regular rate and rhythm (paced), no murmurs, rubs or gallops, PMI not laterally displaced GI- soft, non-tender, non-distended Extremities- no clubbing, cyanosis, or edema MS- no significant deformity or atrophy Skin- warm and dry, no rash or lesion, left chest without hematoma/ecchymosis Psych- euthymic mood, full affect Neuro- no gross deficits   Labs:   Lab Results  Component Value Date   WBC 3.9* 01/13/2016   HGB 10.3* 01/13/2016   HCT 29.7* 01/13/2016   MCV 88.1 01/13/2016   PLT 209 01/13/2016     Recent Labs Lab 01/14/16 0518  NA 137  K 4.1  CL 105  CO2 24  BUN 12  CREATININE 1.37*  CALCIUM 8.7*  GLUCOSE 96    Discharge Medications:    Medication List    TAKE these medications        ALPRAZolam 0.5 MG tablet  Commonly known as:  XANAX  TAKE 1 TABLET TWICE A DAY AS NEEDED     apixaban 5 MG Tabs tablet  Commonly known as:  ELIQUIS  Take 1 tablet (5 mg total) by mouth 2 (two) times daily.  Notes to Patient:  Resume on 01/15/16 morning     carvedilol 3.125 MG tablet  Commonly known as:  COREG  Take 1 tablet (3.125 mg total) by mouth 2 (two) times daily with a meal.     Dutasteride-Tamsulosin HCl 0.5-0.4 MG Caps  TAKE 1 CAPSULE BY MOUTH DAILY     fluticasone 50 MCG/ACT nasal spray  Commonly known as:  FLONASE  Place 2 sprays into both nostrils 2 (two) times daily.     furosemide 40 MG tablet  Commonly known as:  LASIX  Take 1 tablet (40 mg total) by mouth daily.     guaiFENesin 600 MG 12 hr tablet  Commonly known as:  MUCINEX  Take 1 tablet (600 mg total) by mouth 2 (two) times  daily.     KLOR-CON M10 10 MEQ tablet  Generic drug:  potassium chloride  Take 1 tablet by mouth daily.     lisinopril 5 MG tablet  Commonly known as:  PRINIVIL,ZESTRIL  Take 1 tablet (5 mg total) by mouth daily.     omeprazole 20 MG capsule  Commonly known as:  PRILOSEC  TAKE ONE CAPSULE BY MOUTH EVERY DAY     pramipexole 0.125 MG tablet  Commonly known as:  MIRAPEX  Take 1 tablet (0.125 mg total) by mouth at bedtime.     sertraline 100 MG tablet  Commonly known as:  ZOLOFT  TAKE 1.5 TABLETS BY MOUTH DAILY     VICKS VAPORUB EX  Apply 1  application topically as needed (to help breathe).     Vitamin D (Ergocalciferol) 50000 units Caps capsule  Commonly known as:  DRISDOL  Take 1 capsule (50,000 Units total) by mouth every 7 (seven) days.     VITAMIN D PO  Take 1 tablet by mouth daily.        Disposition: Home Discharge Instructions    Diet - low sodium heart healthy    Complete by:  As directed      Increase activity slowly    Complete by:  As directed           Follow-up Information    Follow up with [facility] On 01/26/2016.   Specialty:  Cardiology   Why:  11:30AM   Contact information:   [address] [place] [phone]      Follow up with Thompson Grayer, MD.   Specialty:  Cardiology   Why:  you will be contacted to schedule a 3 month follow up visit with Dr. Amada [place] information:   [place] [address] [phone]       Follow up with Sanda Klein, MD On 01/24/2016.   Specialty:  Cardiology   Why:  11:15AM   Contact information:   [address] [place] [address] [phone]       Follow up with [facility].   Why:  Registered Nurse and Physical Therapy.    Contact information:   [address] [place]  [address] [phone]       Duration of Discharge Encounter: Greater than 30 minutes  including physician time.  Signed, Tommye Standard, PA-C 01/14/2016 10:15 AM    I have seen, examined the patient, and reviewed the above assessment and plan. Device interrogation is reviewed and normal.  Changes to above are made where necessary.    Co Sign: Thompson Grayer, MD 01/14/2016 11:17 AM

## 2016-01-14 NOTE — Progress Notes (Signed)
Doing very well s/p PPM Device interrogation reviewed and normal CXR reveals stable lead, no ptx  Would add coreg 3.125mg  BID Resume eliquis tomorrow am  Routine wound care and follow-up Discharge to home  Thompson Grayer MD, [facility] 01/14/2016 8:59 AM

## 2016-01-14 NOTE — Care Management Note (Signed)
Case Management Note  Patient Details  Name: Micheal Nicholson MRN: [id number] Date of Birth: [birth year]  Subjective/Objective: Pt admitted for Bradycardia. Pt is from home with son. Plan is for home with resumption of [facility].                    Action/Plan: CM did speak with [facility] and resumption orders to be placed in EPIC. SOC to begin within 24-48 hrs post d/c. No further needs from CM at this time.    Expected Discharge Date:                  Expected Discharge Plan:  [place]  In-House Referral:  NA  Discharge planning Services  CM Consult  Post Acute Care Choice:  Home Health, Resumption of Svcs/PTA Provider Choice offered to:  Adult Children  DME Arranged:  N/A DME Agency:  NA  HH Arranged:  RN, PT HH Agency:  [facility]  Status of Service:  Completed, signed off  Medicare Important Message Given:    Date Medicare IM Given:    Medicare IM give by:    Date Additional Medicare IM Given:    Additional Medicare Important Message give by:     If discussed at [place] of Stay Meetings, dates discussed:    Additional Comments:  Bethena Roys, RN 01/14/2016, 10:05 AM

## 2016-01-14 NOTE — Progress Notes (Signed)
Echocardiogram 2D Echocardiogram has been performed.  Micheal Nicholson 01/14/2016, 10:35 AM

## 2016-01-14 NOTE — Discharge Instructions (Signed)
° ° °  Supplemental Discharge Instructions for  Pacemaker/Defibrillator Patients  Activity No heavy lifting or vigorous activity with your left/right arm for 6 to 8 weeks.  Do not raise your left/right arm above your head for one week.  Gradually raise your affected arm as drawn below.          01/18/16                       01/19/16                     01/20/16                  01/21/16 __  NO DRIVING for 1 week    ; you may begin driving on  [id number]   .  WOUND CARE - Keep the wound area clean and dry.  Do not get this area wet for 10 days17. No showers for one week; you may shower on 1/31 . - The tape/steri-strips on your wound will fall off; do not pull them off.  No bandage is needed on the site.  DO  NOT apply any creams, oils, or ointments to the wound area. - If you notice any drainage or discharge from the wound, any swelling or bruising at the site, or you develop a fever > 101? F after you are discharged home, call the office at once.  Special Instructions - You are still able to use cellular telephones; use the ear opposite the side where you have your pacemaker/defibrillator.  Avoid carrying your cellular phone near your device. - When traveling through airports, show security personnel your identification card to avoid being screened in the metal detectors.  Ask the security personnel to use the hand wand. - Avoid arc welding equipment, MRI testing (magnetic resonance imaging), TENS units (transcutaneous nerve stimulators).  Call the office for questions about other devices. - Avoid electrical appliances that are in poor condition or are not properly grounded. - Microwave ovens are safe to be near or to operate.  Additional information for defibrillator patients should your device go off: - If your device goes off ONCE and you feel fine afterward, notify the device clinic nurses. - If your device goes off ONCE and you do not feel well afterward, call 911. - If your device goes off  TWICE, call 911. - If your device goes off THREE times in one day, call 911.  DO NOT DRIVE YOURSELF OR A FAMILY MEMBER WITH A DEFIBRILLATOR TO THE HOSPITAL--CALL 911.

## 2016-01-15 DIAGNOSIS — R001 Bradycardia, unspecified: Secondary | ICD-10-CM | POA: Diagnosis not present

## 2016-01-15 DIAGNOSIS — I5042 Chronic combined systolic (congestive) and diastolic (congestive) heart failure: Secondary | ICD-10-CM | POA: Diagnosis not present

## 2016-01-15 DIAGNOSIS — I959 Hypotension, unspecified: Secondary | ICD-10-CM | POA: Diagnosis not present

## 2016-01-15 DIAGNOSIS — I480 Paroxysmal atrial fibrillation: Secondary | ICD-10-CM | POA: Diagnosis not present

## 2016-01-15 DIAGNOSIS — I453 Trifascicular block: Secondary | ICD-10-CM | POA: Diagnosis not present

## 2016-01-15 DIAGNOSIS — K219 Gastro-esophageal reflux disease without esophagitis: Secondary | ICD-10-CM | POA: Diagnosis not present

## 2016-01-15 DIAGNOSIS — F419 Anxiety disorder, unspecified: Secondary | ICD-10-CM | POA: Diagnosis not present

## 2016-01-17 DIAGNOSIS — F419 Anxiety disorder, unspecified: Secondary | ICD-10-CM | POA: Diagnosis not present

## 2016-01-17 DIAGNOSIS — I959 Hypotension, unspecified: Secondary | ICD-10-CM | POA: Diagnosis not present

## 2016-01-17 DIAGNOSIS — K219 Gastro-esophageal reflux disease without esophagitis: Secondary | ICD-10-CM | POA: Diagnosis not present

## 2016-01-17 DIAGNOSIS — I453 Trifascicular block: Secondary | ICD-10-CM | POA: Diagnosis not present

## 2016-01-17 DIAGNOSIS — R001 Bradycardia, unspecified: Secondary | ICD-10-CM | POA: Diagnosis not present

## 2016-01-17 DIAGNOSIS — I5042 Chronic combined systolic (congestive) and diastolic (congestive) heart failure: Secondary | ICD-10-CM | POA: Diagnosis not present

## 2016-01-17 DIAGNOSIS — I480 Paroxysmal atrial fibrillation: Secondary | ICD-10-CM | POA: Diagnosis not present

## 2016-01-18 ENCOUNTER — Other Ambulatory Visit: Payer: Self-pay | Admitting: Family Medicine

## 2016-01-19 DIAGNOSIS — F419 Anxiety disorder, unspecified: Secondary | ICD-10-CM | POA: Diagnosis not present

## 2016-01-19 DIAGNOSIS — K219 Gastro-esophageal reflux disease without esophagitis: Secondary | ICD-10-CM | POA: Diagnosis not present

## 2016-01-19 DIAGNOSIS — I5042 Chronic combined systolic (congestive) and diastolic (congestive) heart failure: Secondary | ICD-10-CM | POA: Diagnosis not present

## 2016-01-19 DIAGNOSIS — I453 Trifascicular block: Secondary | ICD-10-CM | POA: Diagnosis not present

## 2016-01-19 DIAGNOSIS — R001 Bradycardia, unspecified: Secondary | ICD-10-CM | POA: Diagnosis not present

## 2016-01-19 DIAGNOSIS — I959 Hypotension, unspecified: Secondary | ICD-10-CM | POA: Diagnosis not present

## 2016-01-19 DIAGNOSIS — I480 Paroxysmal atrial fibrillation: Secondary | ICD-10-CM | POA: Diagnosis not present

## 2016-01-20 DIAGNOSIS — K219 Gastro-esophageal reflux disease without esophagitis: Secondary | ICD-10-CM | POA: Diagnosis not present

## 2016-01-20 DIAGNOSIS — I453 Trifascicular block: Secondary | ICD-10-CM | POA: Diagnosis not present

## 2016-01-20 DIAGNOSIS — R001 Bradycardia, unspecified: Secondary | ICD-10-CM | POA: Diagnosis not present

## 2016-01-20 DIAGNOSIS — I5042 Chronic combined systolic (congestive) and diastolic (congestive) heart failure: Secondary | ICD-10-CM | POA: Diagnosis not present

## 2016-01-20 DIAGNOSIS — I959 Hypotension, unspecified: Secondary | ICD-10-CM | POA: Diagnosis not present

## 2016-01-20 DIAGNOSIS — I480 Paroxysmal atrial fibrillation: Secondary | ICD-10-CM | POA: Diagnosis not present

## 2016-01-20 DIAGNOSIS — F419 Anxiety disorder, unspecified: Secondary | ICD-10-CM | POA: Diagnosis not present

## 2016-01-21 ENCOUNTER — Other Ambulatory Visit: Payer: Self-pay | Admitting: *Deleted

## 2016-01-21 MED ORDER — CARVEDILOL 3.125 MG PO TABS: 3.1250 mg | ORAL_TABLET | Freq: Two times a day (BID) | ORAL | Status: DC

## 2016-01-23 DIAGNOSIS — I2510 Atherosclerotic heart disease of native coronary artery without angina pectoris: Secondary | ICD-10-CM | POA: Insufficient documentation

## 2016-01-23 DIAGNOSIS — Z7901 Long term (current) use of anticoagulants: Secondary | ICD-10-CM | POA: Insufficient documentation

## 2016-01-23 NOTE — Progress Notes (Signed)
Patient ID: Micheal Nicholson, male   DOB: [birth year], 82 y.o.   MRN: [id number]    Cardiology Office Note    Date:  01/24/2016   ID:  [name], [name] [birth year], MRN ID:[id number]  PCP:  [name], MD  Cardiologist:   Sanda Klein, MD   Chief Complaint  Patient presents with  . Follow-up    PAF//2 week post implant (ST JUDE)//pt states no Sx. or concerns    History of Present Illness:  Micheal Nicholson is a 82 y.o. male returning in follow up after implantation of a single chamber pacemaker for atrial fibrillation with slow ventricular response, trifascicular block and symptomatic bradycardia even after discontinuation of beta blockers ([employer] Jude, implanted Jan 19).  In 2006, he underwent a stress myocardial perfusion study that suggested an inferior wall scar and he underwent coronary angiography. He had a 30% narrowing in the proximal to mid LAD artery following the takeoff of the first septal and first diagonal branches but no other coronary lesions. His ejection fraction was around 45%. LVEDP was 16 mm Hg and there was no serious valvular abnormality. In 2013 his echocardiogram again showed an ejection fraction around 45% with inferior wall hypokinesis. No valvular abnormalities are described. He had mild biatrial dilatation and mild right ventricular dilatation. In 2015 he presented with asymptomatic atrial fibrillation and his echocardiogram showed a reduction in LVEF to 30-35%. A followup nuclear study showed a small apical inferolateral fixed defect, without reversible ischemia. He has a history of stroke by brain MRI (asymptomatic).  Past Medical History  Diagnosis Date  . Hypertension   . Anxiety   . BPH (benign prostatic hyperplasia)   . Chronic combined systolic and diastolic CHF, NYHA class 2 ([place]) 04/17/2015  . CHF (congestive heart failure) ([place])   . Symptomatic sinus bradycardia     PPM Dr. Rayann Heman, 01/13/16, STJ device  . GERD (gastroesophageal  reflux disease)   . Arthritis     Past Surgical History  Procedure Laterality Date  . Cardiac catheterization  2006    Negative / 4 years ago  . Ep implantable device N/A 01/13/2016    Procedure: Pacemaker Implant;  Surgeon: Thompson Grayer, MD;  Location: [place] [facility];  Service: Cardiovascular;  Laterality: N/A;    Outpatient Prescriptions Prior to Visit  Medication Sig Dispense Refill  . ALPRAZolam (XANAX) 0.5 MG tablet TAKE 1 TABLET TWICE DAILY AS NEEDED 60 tablet 2  . apixaban (ELIQUIS) 5 MG TABS tablet Take 1 tablet (5 mg total) by mouth 2 (two) times daily. 60 tablet 0  . Camphor-Eucalyptus-Menthol (VICKS VAPORUB EX) Apply 1 application topically as needed (to help breathe).    . carvedilol (COREG) 3.125 MG tablet Take 1 tablet (3.125 mg total) by mouth 2 (two) times daily with a meal. 60 tablet 3  . Cholecalciferol (VITAMIN D PO) Take 1 tablet by mouth daily.    . Dutasteride-Tamsulosin HCl 0.5-0.4 MG CAPS TAKE 1 CAPSULE BY MOUTH DAILY 90 capsule 3  . fluticasone (FLONASE) 50 MCG/ACT nasal spray Place 2 sprays into both nostrils 2 (two) times daily.     . furosemide (LASIX) 40 MG tablet Take 1 tablet (40 mg total) by mouth daily. 60 tablet 11  . guaiFENesin (MUCINEX) 600 MG 12 hr tablet Take 1 tablet (600 mg total) by mouth 2 (two) times daily. 30 tablet 0  . KLOR-CON M10 10 MEQ tablet Take 1 tablet by mouth daily.    [place] lisinopril (PRINIVIL,ZESTRIL) 5 MG tablet  Take 1 tablet (5 mg total) by mouth daily. 30 tablet 0  . omeprazole (PRILOSEC) 20 MG capsule TAKE ONE CAPSULE BY MOUTH EVERY DAY 30 capsule 5  . pramipexole (MIRAPEX) 0.125 MG tablet Take 1 tablet (0.125 mg total) by mouth at bedtime. 30 tablet 5  . sertraline (ZOLOFT) 100 MG tablet TAKE 1.5 TABLETS BY MOUTH DAILY (Patient taking differently: TAKE 1.5 TABLETS (150mg ) BY MOUTH DAILY) 45 tablet 3  . Vitamin D, Ergocalciferol, (DRISDOL) 50000 UNITS CAPS capsule Take 1 capsule (50,000 Units total) by mouth every 7 (seven)  days. 30 capsule 0   No facility-administered medications prior to visit.     Allergies:   Chocolate   Social History   Social History  . Marital Status: Widowed    Spouse Name: N/A  . Number of Children: N/A  . Years of Education: N/A   Social History Main Topics  . Smoking status: Former Smoker -- 0.50 packs/day for 5 years  . Smokeless tobacco: Never Used  . Alcohol Use: Yes     Comment: occ  . Drug Use: No  . Sexual Activity: Not Asked   Other Topics Concern  . None   Social History Narrative     Family History:  The patient's family history includes Healthy in his son; Heart disease in his brother; Heart failure in his son; Lung cancer in his brother; Pancreatic cancer in his son; Pneumonia (age of onset: 72) in his father.   ROS:   Please see the history of present illness.    ROS All other systems reviewed and are negative.   PHYSICAL EXAM:   VS:  BP 136/82 mmHg  Pulse 66  Ht 5\' 10"  (1.778 m)  Wt 76.25 kg (168 lb 1.6 oz)  BMI 24.12 kg/m2   GEN: Well nourished, well developed, in no acute distress HEENT: normal Neck: no JVD, carotid bruits, or masses Cardiac: RRR, paradoxically split second heart sound; no murmurs, rubs, or gallops,no edema  Respiratory:  clear to auscultation bilaterally, normal work of breathing GI: soft, nontender, nondistended, + BS MS: no deformity or atrophy Skin: warm and dry, no rash Neuro:  Alert and Oriented x 3, Strength and sensation are intact Psych: euthymic mood, full affect  Wt Readings from Last 3 Encounters:  01/24/16 76.25 kg (168 lb 1.6 oz)  01/13/16 76.658 kg (169 lb)  01/10/16 77.293 kg (170 lb 6.4 oz)      Studies/Labs Reviewed:   EKG:  EKG is not ordered today.  Recent Labs: 03/14/2015: B Natriuretic Peptide 813.5* 12/29/2015: Magnesium 2.5*; TSH 0.542 01/01/2016: ALT 14* 01/13/2016: Hemoglobin 10.3*; Platelets 209 01/14/2016: BUN 12; Creatinine, Ser 1.37*; Potassium 4.1; Sodium 137   Lipid Panel      Component Value Date/Time   CHOL 165 09/25/2012 1207   TRIG 105.0 09/25/2012 1207   HDL 50.10 09/25/2012 1207   CHOLHDL 3 09/25/2012 1207   VLDL 21.0 09/25/2012 1207   LDLCALC 94 09/25/2012 1207    ASSESSMENT:    1. Atrial fibrillation with slow ventricular response (HCC)   2. Persistent atrial fibrillation ([place])   3. Long term current use of anticoagulant therapy   4. Chronic combined systolic and diastolic CHF, NYHA class 2 (HCC)   5. Cardiomyopathy -etiology uncertain   6. Essential hypertension   7. Coronary artery disease involving native coronary artery of native heart without angina pectoris      PLAN:  In order of problems listed above:  1. Bradycardia, symptoms resolved after pacemaker  implantation. He had trifascicular block even when he was in NSR and PR was over 400 ms. Anticipate high prevalenc of V apcing whether he is in SR or AF. 2. Persistent AFib: CHADSVasc 7 (age 75, CVA 2, CAD, HTN, CHF) 3. Anticoagulation with Eliquis well tolerated 4. CHF: back on carvedilol. On ACEi. Hard to say whether on balance beta blocker therapy will help, since it will increase V pacing. At this point, ventricular pacing occurs 92% of the time. Reevaluate in the office for symptoms/signs of heart failure in 3 months before deciding whether we should restart use carvedilol in higher doses  5. CMP: disproportionate to amount of CAD 6. HTN: Adequate blood pressure control 7. CAD: no angina    Medication Adjustments/Labs and Tests Ordered: Current medicines are reviewed at length with the patient today.  Concerns regarding medicines are outlined above.  Medication changes, Labs and Tests ordered today are listed in the Patient Instructions below. Patient Instructions  Dr. [name] recommends that you schedule a follow-up appointment in: 3 months with pacemaker check (ST JUDE)         Mikael Spray, MD  01/24/2016 1:13 PM    [address], [place], [place]  [address] Phone: [phone]; Fax: [phone]

## 2016-01-24 ENCOUNTER — Ambulatory Visit (INDEPENDENT_AMBULATORY_CARE_PROVIDER_SITE_OTHER): Payer: Commercial Managed Care - HMO | Admitting: Cardiovascular Disease

## 2016-01-24 ENCOUNTER — Encounter: Payer: Self-pay | Admitting: Cardiovascular Disease

## 2016-01-24 VITALS — BP 136/82 | HR 66 | Ht 70.0 in | Wt 168.1 lb

## 2016-01-24 DIAGNOSIS — I10 Essential (primary) hypertension: Secondary | ICD-10-CM

## 2016-01-24 DIAGNOSIS — Z7901 Long term (current) use of anticoagulants: Secondary | ICD-10-CM

## 2016-01-24 DIAGNOSIS — I5042 Chronic combined systolic (congestive) and diastolic (congestive) heart failure: Secondary | ICD-10-CM | POA: Diagnosis not present

## 2016-01-24 DIAGNOSIS — I4891 Unspecified atrial fibrillation: Secondary | ICD-10-CM

## 2016-01-24 DIAGNOSIS — I481 Persistent atrial fibrillation: Secondary | ICD-10-CM | POA: Diagnosis not present

## 2016-01-24 DIAGNOSIS — I2510 Atherosclerotic heart disease of native coronary artery without angina pectoris: Secondary | ICD-10-CM

## 2016-01-24 DIAGNOSIS — I429 Cardiomyopathy, unspecified: Secondary | ICD-10-CM

## 2016-01-24 DIAGNOSIS — I4819 Other persistent atrial fibrillation: Secondary | ICD-10-CM

## 2016-01-24 NOTE — Patient Instructions (Signed)
Dr. [name] recommends that you schedule a follow-up appointment in: 3 months with pacemaker check ([place])

## 2016-01-25 DIAGNOSIS — I453 Trifascicular block: Secondary | ICD-10-CM | POA: Diagnosis not present

## 2016-01-25 DIAGNOSIS — F419 Anxiety disorder, unspecified: Secondary | ICD-10-CM | POA: Diagnosis not present

## 2016-01-25 DIAGNOSIS — I480 Paroxysmal atrial fibrillation: Secondary | ICD-10-CM | POA: Diagnosis not present

## 2016-01-25 DIAGNOSIS — I959 Hypotension, unspecified: Secondary | ICD-10-CM | POA: Diagnosis not present

## 2016-01-25 DIAGNOSIS — R001 Bradycardia, unspecified: Secondary | ICD-10-CM | POA: Diagnosis not present

## 2016-01-25 DIAGNOSIS — K219 Gastro-esophageal reflux disease without esophagitis: Secondary | ICD-10-CM | POA: Diagnosis not present

## 2016-01-25 DIAGNOSIS — I5042 Chronic combined systolic (congestive) and diastolic (congestive) heart failure: Secondary | ICD-10-CM | POA: Diagnosis not present

## 2016-01-27 ENCOUNTER — Ambulatory Visit: Payer: Commercial Managed Care - HMO

## 2016-01-27 DIAGNOSIS — F419 Anxiety disorder, unspecified: Secondary | ICD-10-CM | POA: Diagnosis not present

## 2016-01-27 DIAGNOSIS — I480 Paroxysmal atrial fibrillation: Secondary | ICD-10-CM | POA: Diagnosis not present

## 2016-01-27 DIAGNOSIS — R001 Bradycardia, unspecified: Secondary | ICD-10-CM | POA: Diagnosis not present

## 2016-01-27 DIAGNOSIS — I959 Hypotension, unspecified: Secondary | ICD-10-CM | POA: Diagnosis not present

## 2016-01-27 DIAGNOSIS — I5042 Chronic combined systolic (congestive) and diastolic (congestive) heart failure: Secondary | ICD-10-CM | POA: Diagnosis not present

## 2016-01-27 DIAGNOSIS — I453 Trifascicular block: Secondary | ICD-10-CM | POA: Diagnosis not present

## 2016-01-27 DIAGNOSIS — K219 Gastro-esophageal reflux disease without esophagitis: Secondary | ICD-10-CM | POA: Diagnosis not present

## 2016-01-30 ENCOUNTER — Other Ambulatory Visit: Payer: Self-pay | Admitting: Cardiovascular Disease

## 2016-01-31 DIAGNOSIS — K219 Gastro-esophageal reflux disease without esophagitis: Secondary | ICD-10-CM | POA: Diagnosis not present

## 2016-01-31 DIAGNOSIS — I5042 Chronic combined systolic (congestive) and diastolic (congestive) heart failure: Secondary | ICD-10-CM | POA: Diagnosis not present

## 2016-01-31 DIAGNOSIS — I453 Trifascicular block: Secondary | ICD-10-CM | POA: Diagnosis not present

## 2016-01-31 DIAGNOSIS — F419 Anxiety disorder, unspecified: Secondary | ICD-10-CM | POA: Diagnosis not present

## 2016-01-31 DIAGNOSIS — I480 Paroxysmal atrial fibrillation: Secondary | ICD-10-CM | POA: Diagnosis not present

## 2016-01-31 DIAGNOSIS — I959 Hypotension, unspecified: Secondary | ICD-10-CM | POA: Diagnosis not present

## 2016-01-31 DIAGNOSIS — R001 Bradycardia, unspecified: Secondary | ICD-10-CM | POA: Diagnosis not present

## 2016-01-31 NOTE — Telephone Encounter (Signed)
Rx(s) sent to pharmacy electronically.  

## 2016-02-01 DIAGNOSIS — K219 Gastro-esophageal reflux disease without esophagitis: Secondary | ICD-10-CM | POA: Diagnosis not present

## 2016-02-01 DIAGNOSIS — R001 Bradycardia, unspecified: Secondary | ICD-10-CM | POA: Diagnosis not present

## 2016-02-01 DIAGNOSIS — I5042 Chronic combined systolic (congestive) and diastolic (congestive) heart failure: Secondary | ICD-10-CM | POA: Diagnosis not present

## 2016-02-01 DIAGNOSIS — F419 Anxiety disorder, unspecified: Secondary | ICD-10-CM | POA: Diagnosis not present

## 2016-02-01 DIAGNOSIS — I959 Hypotension, unspecified: Secondary | ICD-10-CM | POA: Diagnosis not present

## 2016-02-01 DIAGNOSIS — I453 Trifascicular block: Secondary | ICD-10-CM | POA: Diagnosis not present

## 2016-02-01 DIAGNOSIS — I480 Paroxysmal atrial fibrillation: Secondary | ICD-10-CM | POA: Diagnosis not present

## 2016-02-02 DIAGNOSIS — I480 Paroxysmal atrial fibrillation: Secondary | ICD-10-CM | POA: Diagnosis not present

## 2016-02-02 DIAGNOSIS — I959 Hypotension, unspecified: Secondary | ICD-10-CM | POA: Diagnosis not present

## 2016-02-02 DIAGNOSIS — K219 Gastro-esophageal reflux disease without esophagitis: Secondary | ICD-10-CM | POA: Diagnosis not present

## 2016-02-02 DIAGNOSIS — I453 Trifascicular block: Secondary | ICD-10-CM | POA: Diagnosis not present

## 2016-02-02 DIAGNOSIS — F419 Anxiety disorder, unspecified: Secondary | ICD-10-CM | POA: Diagnosis not present

## 2016-02-02 DIAGNOSIS — R001 Bradycardia, unspecified: Secondary | ICD-10-CM | POA: Diagnosis not present

## 2016-02-02 DIAGNOSIS — I5042 Chronic combined systolic (congestive) and diastolic (congestive) heart failure: Secondary | ICD-10-CM | POA: Diagnosis not present

## 2016-02-03 ENCOUNTER — Other Ambulatory Visit: Payer: Self-pay | Admitting: Family Medicine

## 2016-02-04 ENCOUNTER — Other Ambulatory Visit: Payer: Self-pay | Admitting: Family Medicine

## 2016-02-04 ENCOUNTER — Telehealth: Payer: Self-pay | Admitting: Family Medicine

## 2016-02-04 MED ORDER — APIXABAN 5 MG PO TABS: 5.0000 mg | ORAL_TABLET | Freq: Two times a day (BID) | ORAL | Status: DC

## 2016-02-04 NOTE — Telephone Encounter (Signed)
Medication refilled for pt. 

## 2016-02-04 NOTE — Telephone Encounter (Signed)
[facility] #[id number] - [place], [place] - [phone]  Requesting refill of apixaban (ELIQUIS) 5 MG TABS tablet

## 2016-02-16 DIAGNOSIS — F419 Anxiety disorder, unspecified: Secondary | ICD-10-CM | POA: Diagnosis not present

## 2016-02-16 DIAGNOSIS — I5042 Chronic combined systolic (congestive) and diastolic (congestive) heart failure: Secondary | ICD-10-CM | POA: Diagnosis not present

## 2016-02-16 DIAGNOSIS — I480 Paroxysmal atrial fibrillation: Secondary | ICD-10-CM | POA: Diagnosis not present

## 2016-02-16 DIAGNOSIS — R001 Bradycardia, unspecified: Secondary | ICD-10-CM | POA: Diagnosis not present

## 2016-02-16 DIAGNOSIS — I453 Trifascicular block: Secondary | ICD-10-CM | POA: Diagnosis not present

## 2016-02-16 DIAGNOSIS — I959 Hypotension, unspecified: Secondary | ICD-10-CM | POA: Diagnosis not present

## 2016-02-16 DIAGNOSIS — K219 Gastro-esophageal reflux disease without esophagitis: Secondary | ICD-10-CM | POA: Diagnosis not present

## 2016-02-18 ENCOUNTER — Other Ambulatory Visit: Payer: Self-pay | Admitting: Family Medicine

## 2016-03-01 ENCOUNTER — Other Ambulatory Visit: Payer: Self-pay | Admitting: Cardiovascular Disease

## 2016-03-02 ENCOUNTER — Emergency Department (HOSPITAL_COMMUNITY): Payer: Commercial Managed Care - HMO

## 2016-03-02 ENCOUNTER — Encounter (HOSPITAL_COMMUNITY): Payer: Self-pay | Admitting: Emergency Medicine

## 2016-03-02 ENCOUNTER — Inpatient Hospital Stay (HOSPITAL_COMMUNITY)
Admission: EM | Admit: 2016-03-02 | Discharge: 2016-03-06 | DRG: 065 | Disposition: A | Discharge status: Skilled Nursing Facility | Payer: Commercial Managed Care - HMO | Attending: Internal Medicine | Admitting: Internal Medicine

## 2016-03-02 ENCOUNTER — Inpatient Hospital Stay (HOSPITAL_COMMUNITY): Payer: Commercial Managed Care - HMO

## 2016-03-02 DIAGNOSIS — G8194 Hemiplegia, unspecified affecting left nondominant side: Secondary | ICD-10-CM | POA: Diagnosis not present

## 2016-03-02 DIAGNOSIS — E785 Hyperlipidemia, unspecified: Secondary | ICD-10-CM | POA: Diagnosis present

## 2016-03-02 DIAGNOSIS — I2510 Atherosclerotic heart disease of native coronary artery without angina pectoris: Secondary | ICD-10-CM | POA: Diagnosis present

## 2016-03-02 DIAGNOSIS — R591 Generalized enlarged lymph nodes: Secondary | ICD-10-CM | POA: Insufficient documentation

## 2016-03-02 DIAGNOSIS — R278 Other lack of coordination: Secondary | ICD-10-CM | POA: Diagnosis not present

## 2016-03-02 DIAGNOSIS — S098XXA Other specified injuries of head, initial encounter: Secondary | ICD-10-CM | POA: Diagnosis not present

## 2016-03-02 DIAGNOSIS — R599 Enlarged lymph nodes, unspecified: Secondary | ICD-10-CM | POA: Diagnosis not present

## 2016-03-02 DIAGNOSIS — I63412 Cerebral infarction due to embolism of left middle cerebral artery: Secondary | ICD-10-CM

## 2016-03-02 DIAGNOSIS — I480 Paroxysmal atrial fibrillation: Secondary | ICD-10-CM | POA: Diagnosis present

## 2016-03-02 DIAGNOSIS — R590 Localized enlarged lymph nodes: Secondary | ICD-10-CM | POA: Diagnosis present

## 2016-03-02 DIAGNOSIS — Z79899 Other long term (current) drug therapy: Secondary | ICD-10-CM | POA: Diagnosis not present

## 2016-03-02 DIAGNOSIS — F419 Anxiety disorder, unspecified: Secondary | ICD-10-CM | POA: Diagnosis present

## 2016-03-02 DIAGNOSIS — I639 Cerebral infarction, unspecified: Secondary | ICD-10-CM

## 2016-03-02 DIAGNOSIS — G629 Polyneuropathy, unspecified: Secondary | ICD-10-CM | POA: Diagnosis present

## 2016-03-02 DIAGNOSIS — I63411 Cerebral infarction due to embolism of right middle cerebral artery: Secondary | ICD-10-CM | POA: Diagnosis not present

## 2016-03-02 DIAGNOSIS — Z5181 Encounter for therapeutic drug level monitoring: Secondary | ICD-10-CM | POA: Diagnosis not present

## 2016-03-02 DIAGNOSIS — K219 Gastro-esophageal reflux disease without esophagitis: Secondary | ICD-10-CM | POA: Diagnosis present

## 2016-03-02 DIAGNOSIS — D696 Thrombocytopenia, unspecified: Secondary | ICD-10-CM | POA: Diagnosis present

## 2016-03-02 DIAGNOSIS — I482 Chronic atrial fibrillation: Secondary | ICD-10-CM | POA: Diagnosis present

## 2016-03-02 DIAGNOSIS — N400 Enlarged prostate without lower urinary tract symptoms: Secondary | ICD-10-CM | POA: Diagnosis present

## 2016-03-02 DIAGNOSIS — M792 Neuralgia and neuritis, unspecified: Secondary | ICD-10-CM | POA: Diagnosis present

## 2016-03-02 DIAGNOSIS — I5043 Acute on chronic combined systolic (congestive) and diastolic (congestive) heart failure: Secondary | ICD-10-CM | POA: Diagnosis not present

## 2016-03-02 DIAGNOSIS — M6281 Muscle weakness (generalized): Secondary | ICD-10-CM | POA: Diagnosis not present

## 2016-03-02 DIAGNOSIS — G2581 Restless legs syndrome: Secondary | ICD-10-CM | POA: Diagnosis present

## 2016-03-02 DIAGNOSIS — I6340 Cerebral infarction due to embolism of unspecified cerebral artery: Principal | ICD-10-CM | POA: Diagnosis present

## 2016-03-02 DIAGNOSIS — Z8673 Personal history of transient ischemic attack (TIA), and cerebral infarction without residual deficits: Secondary | ICD-10-CM | POA: Diagnosis not present

## 2016-03-02 DIAGNOSIS — W06XXXA Fall from bed, initial encounter: Secondary | ICD-10-CM | POA: Diagnosis present

## 2016-03-02 DIAGNOSIS — R29818 Other symptoms and signs involving the nervous system: Secondary | ICD-10-CM | POA: Diagnosis not present

## 2016-03-02 DIAGNOSIS — N179 Acute kidney failure, unspecified: Secondary | ICD-10-CM | POA: Diagnosis not present

## 2016-03-02 DIAGNOSIS — Z87891 Personal history of nicotine dependence: Secondary | ICD-10-CM | POA: Diagnosis not present

## 2016-03-02 DIAGNOSIS — Z91018 Allergy to other foods: Secondary | ICD-10-CM | POA: Diagnosis not present

## 2016-03-02 DIAGNOSIS — Z7901 Long term (current) use of anticoagulants: Secondary | ICD-10-CM | POA: Diagnosis not present

## 2016-03-02 DIAGNOSIS — D649 Anemia, unspecified: Secondary | ICD-10-CM | POA: Diagnosis not present

## 2016-03-02 DIAGNOSIS — G8190 Hemiplegia, unspecified affecting unspecified side: Secondary | ICD-10-CM

## 2016-03-02 DIAGNOSIS — I255 Ischemic cardiomyopathy: Secondary | ICD-10-CM | POA: Diagnosis not present

## 2016-03-02 DIAGNOSIS — I6789 Other cerebrovascular disease: Secondary | ICD-10-CM | POA: Diagnosis not present

## 2016-03-02 DIAGNOSIS — I5042 Chronic combined systolic (congestive) and diastolic (congestive) heart failure: Secondary | ICD-10-CM | POA: Diagnosis present

## 2016-03-02 DIAGNOSIS — R930 Abnormal findings on diagnostic imaging of skull and head, not elsewhere classified: Secondary | ICD-10-CM | POA: Diagnosis not present

## 2016-03-02 DIAGNOSIS — I69322 Dysarthria following cerebral infarction: Secondary | ICD-10-CM | POA: Diagnosis not present

## 2016-03-02 DIAGNOSIS — R29707 NIHSS score 7: Secondary | ICD-10-CM | POA: Diagnosis present

## 2016-03-02 DIAGNOSIS — I517 Cardiomegaly: Secondary | ICD-10-CM | POA: Diagnosis not present

## 2016-03-02 DIAGNOSIS — Z950 Presence of cardiac pacemaker: Secondary | ICD-10-CM | POA: Diagnosis not present

## 2016-03-02 DIAGNOSIS — I63131 Cerebral infarction due to embolism of right carotid artery: Secondary | ICD-10-CM | POA: Diagnosis not present

## 2016-03-02 DIAGNOSIS — I10 Essential (primary) hypertension: Secondary | ICD-10-CM | POA: Diagnosis present

## 2016-03-02 DIAGNOSIS — G459 Transient cerebral ischemic attack, unspecified: Secondary | ICD-10-CM | POA: Diagnosis not present

## 2016-03-02 DIAGNOSIS — I69354 Hemiplegia and hemiparesis following cerebral infarction affecting left non-dominant side: Secondary | ICD-10-CM | POA: Diagnosis not present

## 2016-03-02 LAB — IRON AND TIBC
Iron: 22 ug/dL — ABNORMAL LOW (ref 45–182)
SATURATION RATIOS: 9 % — AB (ref 17.9–39.5)
TIBC: 253 ug/dL (ref 250–450)
UIBC: 231 ug/dL

## 2016-03-02 LAB — COMPREHENSIVE METABOLIC PANEL
ALK PHOS: 54 U/L (ref 38–126)
ALT: 12 U/L — ABNORMAL LOW (ref 17–63)
ANION GAP: 13 (ref 5–15)
AST: 20 U/L (ref 15–41)
Albumin: 3.7 g/dL (ref 3.5–5.0)
BUN: 15 mg/dL (ref 6–20)
CALCIUM: 9.3 mg/dL (ref 8.9–10.3)
CO2: 24 mmol/L (ref 22–32)
Chloride: 102 mmol/L (ref 101–111)
Creatinine, Ser: 1.29 mg/dL — ABNORMAL HIGH (ref 0.61–1.24)
GFR calc non Af Amer: 46 mL/min — ABNORMAL LOW (ref >60)
GFR, EST AFRICAN AMERICAN: 53 mL/min — AB (ref >60)
Glucose, Bld: 109 mg/dL — ABNORMAL HIGH (ref 65–99)
POTASSIUM: 4.4 mmol/L (ref 3.5–5.1)
SODIUM: 139 mmol/L (ref 135–145)
Total Bilirubin: 1.2 mg/dL (ref 0.3–1.2)
Total Protein: 6.2 g/dL — ABNORMAL LOW (ref 6.5–8.1)

## 2016-03-02 LAB — RAPID URINE DRUG SCREEN, HOSP PERFORMED
AMPHETAMINES: NOT DETECTED
Barbiturates: NOT DETECTED
Benzodiazepines: POSITIVE — AB
COCAINE: NOT DETECTED
OPIATES: NOT DETECTED
Tetrahydrocannabinol: NOT DETECTED

## 2016-03-02 LAB — CBC
HCT: 35.8 % — ABNORMAL LOW (ref 39.0–52.0)
Hemoglobin: 12.3 g/dL — ABNORMAL LOW (ref 13.0–17.0)
MCH: 29.9 pg (ref 26.0–34.0)
MCHC: 34.4 g/dL (ref 30.0–36.0)
MCV: 86.9 fL (ref 78.0–100.0)
PLATELETS: 150 10*3/uL (ref 150–400)
RBC: 4.12 MIL/uL — ABNORMAL LOW (ref 4.22–5.81)
RDW: 13.1 % (ref 11.5–15.5)
WBC: 5.8 10*3/uL (ref 4.0–10.5)

## 2016-03-02 LAB — CBG MONITORING, ED: GLUCOSE-CAPILLARY: 107 mg/dL — AB (ref 65–99)

## 2016-03-02 LAB — RENAL FUNCTION PANEL
Albumin: 3.9 g/dL (ref 3.5–5.0)
Anion gap: 13 (ref 5–15)
BUN: 15 mg/dL (ref 6–20)
CALCIUM: 9.2 mg/dL (ref 8.9–10.3)
CO2: 24 mmol/L (ref 22–32)
CREATININE: 1.28 mg/dL — AB (ref 0.61–1.24)
Chloride: 101 mmol/L (ref 101–111)
GFR calc Af Amer: 54 mL/min — ABNORMAL LOW (ref >60)
GFR calc non Af Amer: 46 mL/min — ABNORMAL LOW (ref >60)
GLUCOSE: 116 mg/dL — AB (ref 65–99)
Phosphorus: 4.5 mg/dL (ref 2.5–4.6)
Potassium: 4.7 mmol/L (ref 3.5–5.1)
Sodium: 138 mmol/L (ref 135–145)

## 2016-03-02 LAB — PROTIME-INR
INR: 1.52 — ABNORMAL HIGH (ref 0.00–1.49)
PROTHROMBIN TIME: 18.4 s — AB (ref 11.6–15.2)

## 2016-03-02 LAB — DIFFERENTIAL
BASOS PCT: 0 %
Basophils Absolute: 0 10*3/uL (ref 0.0–0.1)
EOS ABS: 0.1 10*3/uL (ref 0.0–0.7)
EOS PCT: 2 %
LYMPHS PCT: 17 %
Lymphs Abs: 1 10*3/uL (ref 0.7–4.0)
MONO ABS: 0.5 10*3/uL (ref 0.1–1.0)
Monocytes Relative: 9 %
NEUTROS PCT: 72 %
Neutro Abs: 4.2 10*3/uL (ref 1.7–7.7)

## 2016-03-02 LAB — URINALYSIS, ROUTINE W REFLEX MICROSCOPIC
BILIRUBIN URINE: NEGATIVE
Glucose, UA: NEGATIVE mg/dL
Hgb urine dipstick: NEGATIVE
Ketones, ur: NEGATIVE mg/dL
NITRITE: NEGATIVE
PH: 7 (ref 5.0–8.0)
PROTEIN: NEGATIVE mg/dL
SPECIFIC GRAVITY, URINE: 1.022 (ref 1.005–1.030)

## 2016-03-02 LAB — I-STAT CHEM 8, ED
BUN: 19 mg/dL (ref 6–20)
CALCIUM ION: 1.11 mmol/L — AB (ref 1.13–1.30)
Chloride: 99 mmol/L — ABNORMAL LOW (ref 101–111)
Creatinine, Ser: 1.2 mg/dL (ref 0.61–1.24)
Glucose, Bld: 107 mg/dL — ABNORMAL HIGH (ref 65–99)
HEMATOCRIT: 38 % — AB (ref 39.0–52.0)
Hemoglobin: 12.9 g/dL — ABNORMAL LOW (ref 13.0–17.0)
Potassium: 4.3 mmol/L (ref 3.5–5.1)
SODIUM: 138 mmol/L (ref 135–145)
TCO2: 26 mmol/L (ref 0–100)

## 2016-03-02 LAB — APTT: aPTT: 39 seconds — ABNORMAL HIGH (ref 24–37)

## 2016-03-02 LAB — ETHANOL

## 2016-03-02 LAB — URINE MICROSCOPIC-ADD ON: BACTERIA UA: NONE SEEN

## 2016-03-02 LAB — RETICULOCYTES
RBC.: 4.36 MIL/uL (ref 4.22–5.81)
RETIC CT PCT: 1.4 % (ref 0.4–3.1)
Retic Count, Absolute: 61 10*3/uL (ref 19.0–186.0)

## 2016-03-02 LAB — FERRITIN: FERRITIN: 313 ng/mL (ref 24–336)

## 2016-03-02 LAB — I-STAT TROPONIN, ED: Troponin i, poc: 0.01 ng/mL (ref 0.00–0.08)

## 2016-03-02 LAB — VITAMIN B12: Vitamin B-12: 833 pg/mL (ref 180–914)

## 2016-03-02 LAB — TROPONIN I: Troponin I: <0.03 ng/mL (ref <0.031)

## 2016-03-02 MED ORDER — DUTASTERIDE 0.5 MG PO CAPS
0.5000 mg | ORAL_CAPSULE | Freq: Every day | ORAL | Status: DC
  Administered 2016-03-02 – 2016-03-06 (×5): 0.5 mg via ORAL
  Filled 2016-03-02 (×9): qty 1

## 2016-03-02 MED ORDER — PRAMIPEXOLE DIHYDROCHLORIDE 0.125 MG PO TABS
0.1250 mg | ORAL_TABLET | Freq: Every day | ORAL | Status: DC
  Administered 2016-03-02: 0.125 mg via ORAL
  Filled 2016-03-02: qty 1

## 2016-03-02 MED ORDER — FLUTICASONE PROPIONATE 50 MCG/ACT NA SUSP
2.0000 | Freq: Two times a day (BID) | NASAL | Status: DC
  Administered 2016-03-02 – 2016-03-06 (×8): 2 via NASAL
  Filled 2016-03-02: qty 16

## 2016-03-02 MED ORDER — STROKE: EARLY STAGES OF RECOVERY BOOK
Freq: Once | Status: AC
  Administered 2016-03-02: 18:00:00

## 2016-03-02 MED ORDER — SODIUM CHLORIDE 0.9% FLUSH: 3.0000 mL | INTRAVENOUS | Status: DC | PRN

## 2016-03-02 MED ORDER — DUTASTERIDE-TAMSULOSIN HCL 0.5-0.4 MG PO CAPS
1.0000 | ORAL_CAPSULE | Freq: Every day | ORAL | Status: DC
Start: 2016-03-02 — End: 2016-03-02

## 2016-03-02 MED ORDER — SODIUM CHLORIDE 0.9 % IV SOLN: 250.0000 mL | INTRAVENOUS | Status: DC | PRN

## 2016-03-02 MED ORDER — SODIUM CHLORIDE 0.9 % IV SOLN: INTRAVENOUS | Status: AC

## 2016-03-02 MED ORDER — APIXABAN 5 MG PO TABS
5.0000 mg | ORAL_TABLET | Freq: Two times a day (BID) | ORAL | Status: DC
  Administered 2016-03-02 – 2016-03-04 (×5): 5 mg via ORAL
  Filled 2016-03-02 (×6): qty 1

## 2016-03-02 MED ORDER — ALPRAZOLAM 0.25 MG PO TABS
0.2500 mg | ORAL_TABLET | Freq: Once | ORAL | Status: AC
  Administered 2016-03-02: 0.25 mg via ORAL
  Filled 2016-03-02: qty 1

## 2016-03-02 MED ORDER — ACETAMINOPHEN 325 MG PO TABS
650.0000 mg | ORAL_TABLET | ORAL | Status: DC | PRN
  Administered 2016-03-03 – 2016-03-06 (×5): 650 mg via ORAL
  Filled 2016-03-02 (×5): qty 2

## 2016-03-02 MED ORDER — BACITRACIN-POLYMYXIN B 500-10000 UNIT/GM OP OINT
TOPICAL_OINTMENT | Freq: Two times a day (BID) | OPHTHALMIC | Status: DC
  Administered 2016-03-03 (×3): via OPHTHALMIC
  Administered 2016-03-04: 1 via OPHTHALMIC
  Administered 2016-03-04: 11:00:00 via OPHTHALMIC
  Administered 2016-03-05: 1 via OPHTHALMIC
  Administered 2016-03-05: 11:00:00 via OPHTHALMIC
  Filled 2016-03-02: qty 3.5

## 2016-03-02 MED ORDER — ACETAMINOPHEN 650 MG RE SUPP
650.0000 mg | RECTAL | Status: DC | PRN
  Filled 2016-03-02: qty 1

## 2016-03-02 MED ORDER — GUAIFENESIN ER 600 MG PO TB12
600.0000 mg | ORAL_TABLET | Freq: Two times a day (BID) | ORAL | Status: DC
  Administered 2016-03-02 – 2016-03-06 (×8): 600 mg via ORAL
  Filled 2016-03-02 (×8): qty 1

## 2016-03-02 MED ORDER — SODIUM CHLORIDE 0.9 % IV SOLN
INTRAVENOUS | Status: DC
  Administered 2016-03-02: 18:00:00 via INTRAVENOUS

## 2016-03-02 MED ORDER — SERTRALINE HCL 50 MG PO TABS
150.0000 mg | ORAL_TABLET | Freq: Every day | ORAL | Status: DC
  Administered 2016-03-02 – 2016-03-06 (×5): 150 mg via ORAL
  Filled 2016-03-02 (×5): qty 1

## 2016-03-02 MED ORDER — PANTOPRAZOLE SODIUM 40 MG PO TBEC
40.0000 mg | DELAYED_RELEASE_TABLET | Freq: Every day | ORAL | Status: DC
  Administered 2016-03-02 – 2016-03-06 (×5): 40 mg via ORAL
  Filled 2016-03-02 (×5): qty 1

## 2016-03-02 MED ORDER — TAMSULOSIN HCL 0.4 MG PO CAPS
0.4000 mg | ORAL_CAPSULE | Freq: Every day | ORAL | Status: DC
  Administered 2016-03-02 – 2016-03-06 (×5): 0.4 mg via ORAL
  Filled 2016-03-02 (×5): qty 1

## 2016-03-02 MED ORDER — IOHEXOL 350 MG/ML SOLN
50.0000 mL | Freq: Once | INTRAVENOUS | Status: AC | PRN
  Administered 2016-03-02: 50 mL via INTRAVENOUS

## 2016-03-02 MED ORDER — VITAMIN D (ERGOCALCIFEROL) 1.25 MG (50000 UNIT) PO CAPS: 50000.0000 [IU] | ORAL_CAPSULE | ORAL | Status: DC

## 2016-03-02 MED ORDER — SENNOSIDES-DOCUSATE SODIUM 8.6-50 MG PO TABS: 1.0000 | ORAL_TABLET | Freq: Every evening | ORAL | Status: DC | PRN

## 2016-03-02 MED ORDER — SODIUM CHLORIDE 0.9% FLUSH
3.0000 mL | Freq: Two times a day (BID) | INTRAVENOUS | Status: DC
Start: 2016-03-02 — End: 2016-03-06
  Administered 2016-03-02 – 2016-03-05 (×6): 3 mL via INTRAVENOUS

## 2016-03-02 MED ORDER — CARVEDILOL 3.125 MG PO TABS
3.1250 mg | ORAL_TABLET | Freq: Two times a day (BID) | ORAL | Status: DC
  Administered 2016-03-02 – 2016-03-06 (×8): 3.125 mg via ORAL
  Filled 2016-03-02 (×8): qty 1

## 2016-03-02 MED ORDER — ONDANSETRON HCL 4 MG/2ML IJ SOLN
4.0000 mg | Freq: Three times a day (TID) | INTRAMUSCULAR | Status: AC | PRN
  Administered 2016-03-02: 4 mg via INTRAVENOUS
  Filled 2016-03-02: qty 2

## 2016-03-02 NOTE — ED Provider Notes (Signed)
CSN: [id number]     Arrival date & time 03/02/16  0905 History   First MD Initiated Contact with Patient 03/02/16 0913     Chief Complaint  Patient presents with  . Code Stroke    @EDPCLEARED @  HPI Patient presents with new onset left-sided hemiplegia.  Suffered fall hit his left forehead.  Has history of old stroke in the past.  Patient presented as a code stroke. Past Medical History  Diagnosis Date  . Hypertension   . Anxiety   . BPH (benign prostatic hyperplasia)   . Chronic combined systolic and diastolic CHF, NYHA class 2 ([place]) 04/17/2015  . CHF (congestive heart failure) ([place])   . Symptomatic sinus bradycardia     PPM Dr. Rayann Heman, 01/13/16, STJ device  . GERD (gastroesophageal reflux disease)   . Arthritis    Past Surgical History  Procedure Laterality Date  . Cardiac catheterization  2006    Negative / 4 years ago  . Ep implantable device N/A 01/13/2016    Procedure: Pacemaker Implant;  Surgeon: Thompson Grayer, MD;  Location: [facility];  Service: Cardiovascular;  Laterality: N/A;   Family History  Problem Relation Age of Onset  . Pneumonia Father 46    died  . Lung cancer Brother   . Heart disease Brother   . Pancreatic cancer Son   . Healthy Son   . Heart failure Son    Social History  Substance Use Topics  . Smoking status: Former Smoker -- 0.50 packs/day for 5 years  . Smokeless tobacco: Never Used  . Alcohol Use: Yes     Comment: occ    Review of Systems  Unable to perform ROS: Acuity of condition      Allergies  Chocolate  Home Medications   Prior to Admission medications   Medication Sig Start Date End Date Taking? Authorizing Provider  ALPRAZolam Duanne Moron) 0.5 MG tablet TAKE 1 TABLET TWICE DAILY AS NEEDED 01/20/16   [name], MD  apixaban (ELIQUIS) 5 MG TABS tablet Take 1 tablet (5 mg total) by mouth 2 (two) times daily. 02/04/16   [name], MD  Camphor-Eucalyptus-Menthol (VICKS VAPORUB EX) Apply 1 application topically as  needed (to help breathe).    Historical Provider, MD  carvedilol (COREG) 3.125 MG tablet Take 1 tablet (3.125 mg total) by mouth 2 (two) times daily with a meal. 01/21/16   Isaiah Serge, NP  Cholecalciferol (VITAMIN D PO) Take 1 tablet by mouth daily.    Historical Provider, MD  Dutasteride-Tamsulosin HCl 0.5-0.4 MG CAPS TAKE 1 CAPSULE BY MOUTH DAILY 10/20/15   [name], MD  fluticasone (FLONASE) 50 MCG/ACT nasal spray Place 2 sprays into both nostrils 2 (two) times daily.     Historical Provider, MD  furosemide (LASIX) 40 MG tablet Take 1 tablet (40 mg total) by mouth daily. 01/03/16   Delfina Redwood, MD  guaiFENesin (MUCINEX) 600 MG 12 hr tablet Take 1 tablet (600 mg total) by mouth 2 (two) times daily. 12/23/15   Renee A Kuneff, DO  KLOR-CON M10 10 MEQ tablet Take 1 tablet by mouth daily. 07/23/15   Historical Provider, MD  lisinopril (PRINIVIL,ZESTRIL) 40 MG tablet TAKE 1 TABLET BY MOUTH EVERY DAY 01/31/16   Mihai Croitoru, MD  lisinopril (PRINIVIL,ZESTRIL) 5 MG tablet TAKE 1 TABLET BY MOUTH EVERY DAY 02/18/16   [name], MD  omeprazole (PRILOSEC) 20 MG capsule TAKE ONE CAPSULE BY MOUTH EVERY DAY 09/09/15   Bruce  Dan Europe, MD  pramipexole (MIRAPEX) 0.125 MG tablet TAKE 1 TABLET BY MOUTH AT BEDTIME 02/03/16   [name], MD  sertraline (ZOLOFT) 100 MG tablet TAKE 1.5 TABLETS BY MOUTH DAILY Patient taking differently: TAKE 1.5 TABLETS (150mg ) BY MOUTH DAILY 10/25/15   [name], MD  Vitamin D, Ergocalciferol, (DRISDOL) 50000 UNITS CAPS capsule Take 1 capsule (50,000 Units total) by mouth every 7 (seven) days. 03/08/15   [name], MD   BP 141/91 mmHg  Pulse 62  Temp(Src) 97.8 F (36.6 C) (Temporal)  Resp 18  Ht 5\' 10"  (1.778 m)  Wt 174 lb 6 oz (79.096 kg)  BMI 25.02 kg/m2  SpO2 98% Physical Exam  Constitutional: He appears well-developed and well-nourished. No distress.  HENT:  Head: Normocephalic and atraumatic.  Eyes: Pupils are equal, round,  and reactive to light.  Neck: Normal range of motion.  Cardiovascular: Normal rate and intact distal pulses.   Pulmonary/Chest: No respiratory distress.  Abdominal: Normal appearance. He exhibits no distension.  Musculoskeletal: Normal range of motion.  Neurological: He is alert. He exhibits abnormal muscle tone. GCS eye subscore is 4. GCS verbal subscore is 5. GCS motor subscore is 6.  Hemiplegia noted left arm with some involvement left leg  Skin: Skin is warm and dry. No rash noted.  Psychiatric: He has a normal mood and affect. His behavior is normal.  Nursing note and vitals reviewed.   ED Course  Procedures (including critical care time) CRITICAL CARE Performed by: Leonard Schwartz L Total critical care time: 30 minutes Critical care time was exclusive of separately billable procedures and treating other patients. Critical care was necessary to treat or prevent imminent or life-threatening deterioration. Critical care was time spent personally by me on the following activities: development of treatment plan with patient and/or surrogate as well as nursing, discussions with consultants, evaluation of patient's response to treatment, examination of patient, obtaining history from patient or surrogate, ordering and performing treatments and interventions, ordering and review of laboratory studies, ordering and review of radiographic studies, pulse oximetry and re-evaluation of patient's condition.  Labs Review Labs Reviewed  PROTIME-INR - Abnormal; Notable for the following:    Prothrombin Time 18.4 (*)    INR 1.52 (*)    All other components within normal limits  APTT - Abnormal; Notable for the following:    aPTT 39 (*)    All other components within normal limits  CBC - Abnormal; Notable for the following:    RBC 4.12 (*)    Hemoglobin 12.3 (*)    HCT 35.8 (*)    All other components within normal limits  COMPREHENSIVE METABOLIC PANEL - Abnormal; Notable for the following:     Glucose, Bld 109 (*)    Creatinine, Ser 1.29 (*)    Total Protein 6.2 (*)    ALT 12 (*)    GFR calc non Af Amer 46 (*)    GFR calc Af Amer 53 (*)    All other components within normal limits  I-STAT CHEM 8, ED - Abnormal; Notable for the following:    Chloride 99 (*)    Glucose, Bld 107 (*)    Calcium, Ion 1.11 (*)    Hemoglobin 12.9 (*)    HCT 38.0 (*)    All other components within normal limits  CBG MONITORING, ED - Abnormal; Notable for the following:    Glucose-Capillary 107 (*)    All other components within normal limits  ETHANOL  DIFFERENTIAL  URINE RAPID DRUG SCREEN, HOSP PERFORMED  URINALYSIS, ROUTINE W REFLEX MICROSCOPIC (NOT AT [facility])  I-STAT TROPOININ, ED    Imaging Review Ct Head Wo Contrast  03/02/2016  CLINICAL DATA:  LEFT side weakness, RIGHT dominant gaze, history RIGHT-sided stroke, code stroke, essential hypertension, CHF, former smoker, atrial fibrillation, cardiomyopathy, renal failure EXAM: CT HEAD WITHOUT CONTRAST CT CERVICAL SPINE WITHOUT CONTRAST TECHNIQUE: Multidetector CT imaging of the head and cervical spine was performed following the standard protocol without intravenous contrast. Multiplanar CT image reconstructions of the cervical spine were also generated. COMPARISON:  None FINDINGS: CT HEAD FINDINGS Generalized atrophy. Normal ventricular morphology. No midline shift or mass effect. Small old LEFT occipital infarct. Small vessel chronic ischemic changes of deep cerebral white matter. No intracranial hemorrhage, mass lesion, evidence acute infarction, or extra-axial fluid collections. Mucosal thickening BILATERAL maxillary sinuses. Bones demineralized. CT CERVICAL SPINE FINDINGS Diffuse osseous demineralization. Prevertebral soft tissues normal thickness. Multilevel disc space narrowing and minimal endplate spur formation. Multilevel facet degenerative changes. Minimal anterolisthesis C7-T1 likely degenerative. Incomplete posterior arch C1, normal variant.  Head rotation to the RIGHT with minimal rotary subluxation at C1-C2. Vertebral body heights maintained without fracture or additional subluxation. Visualized skullbase intact. Lung apices clear. IMPRESSION: Atrophy with mild small vessel chronic ischemic changes of deep cerebral white matter. 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 No acute intracranial abnormalities. Osseous demineralization with degenerative disc and facet disease changes of the cervical spine as above. No acute abnormalities. Code Stroke results called to Dr. Nicole Kindred on 03/02/2016 at 0938 hours. Electronically Signed   By: Lavonia Dana M.D.   On: 03/02/2016 09:46   Ct Cervical Spine Wo Contrast  03/02/2016  CLINICAL DATA:  LEFT side weakness, RIGHT dominant gaze, history RIGHT-sided stroke, code stroke, essential hypertension, CHF, former smoker, atrial fibrillation, cardiomyopathy, renal failure EXAM: CT HEAD WITHOUT CONTRAST CT CERVICAL SPINE WITHOUT CONTRAST TECHNIQUE: Multidetector CT imaging of the head and cervical spine was performed following the standard protocol without intravenous contrast. Multiplanar CT image reconstructions of the cervical spine were also generated. COMPARISON:  None FINDINGS: CT HEAD FINDINGS Generalized atrophy. Normal ventricular morphology. No midline shift or mass effect. Small old LEFT occipital infarct. Small vessel chronic ischemic changes of deep cerebral white matter. No intracranial hemorrhage, mass lesion, evidence acute infarction, or extra-axial fluid collections. Mucosal thickening BILATERAL maxillary sinuses. Bones demineralized. CT CERVICAL SPINE FINDINGS Diffuse osseous demineralization. Prevertebral soft tissues normal thickness. Multilevel disc space narrowing and minimal endplate spur formation. Multilevel facet degenerative changes. Minimal anterolisthesis C7-T1 likely degenerative. Incomplete posterior arch C1, normal variant. Head rotation to the RIGHT with minimal rotary subluxation at  C1-C2. Vertebral body heights maintained without fracture or additional subluxation. Visualized skullbase intact. Lung apices clear. IMPRESSION: Atrophy with mild small vessel chronic ischemic changes of deep cerebral white matter. 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 No acute intracranial abnormalities. Osseous demineralization with degenerative disc and facet disease changes of the cervical spine as above. No acute abnormalities. Code Stroke results called to Dr. Nicole Kindred on 03/02/2016 at 0938 hours. Electronically Signed   By: Lavonia Dana M.D.   On: 03/02/2016 09:46   I have personally reviewed and evaluated these images and lab results as part of my medical decision-making.   EKG Interpretation   Date/Time:  Thursday March 02 2016 09:23:51 EST Ventricular Rate:  60 PR Interval:    QRS Duration: 212 QT Interval:  511 QTC Calculation: 511 R Axis:   -74 Text Interpretation:  Atrial fibrillation Ventricular premature complex  Nonspecific  IVCD with LAD Left ventricular hypertrophy Anterolateral  infarct, age indeterminate Confirmed by Dacey Milberger  MD, Janese Radabaugh [phone]) on  03/02/2016 10:23:48 AM     Patient initially seen by the neuro hospitalist.  Recommended admission to hospitalist service and he will follow and recommend treatment. MDM   Final diagnoses:  Hemiplegia ([place])        Leonard Schwartz, MD 03/02/16 1025

## 2016-03-02 NOTE — Code Documentation (Signed)
82yo male arriving to [facility] via [place] at 0905.  EMS reports patient woke up at 0800 and went to the bathroom to brush his teeth, went back to bed then rolled out of the bed hitting the floor.  Patient was initially hemiplegic on the left side when the medic arrived.  Patient with right gaze and left arm flaccid per EMS with improved movement in the left leg.  Code stroke activated by EMS.  Stroke team at the bedside on patient arrival.  Labs drawn and patient cleared for CT by Dr. Audie Pinto.  Patient with abrasion to left forehead and left knee.  Patient to CT with ED RN and stroke team.  Patient is on Eliquis and took a dose this morning per patient.  CT completed.  Patient to E37.  NIHSS 7, see documentation for details and code stroke times.  Patient with partial right gaze and left sided weakness (arm>leg).  Patient with only minimal movement in left hand.  Dr. Nicole Kindred to the bedside.  Patient is contraindicated for tPA d/t taking Eliquis.  18G PIV placed in the [facility] by ED RN.  Patient back to CT for CTA head and neck.  Patient tolerated exam well.  Patient reporting that he woke up at 0400 and felt weak and unsteady, made himself breakfast and took his medicine, then went back to bed and when he sat up to spit in the trash can he fell out of bed.  LKW now yesterday at 2200.  Patient with continued improvement - now moving left arm and improved gaze.  No acute stroke treatment at this time per Dr. Nicole Kindred.  Patient's son at the bedside and updated on plan of care.  Bedside handoff with ED RN Marya Amsler.

## 2016-03-02 NOTE — ED Notes (Signed)
Admit Doctor at bedside.  

## 2016-03-02 NOTE — H&P (Signed)
[facility] History and Physical                                                                                    Micheal Nicholson, is a 82 y.o. male  MRN: [id number]   DOB - Dec 24, 1920  Admit Date - 03/02/2016  Outpatient Primary MD   [name], MD  Card[facility] [place]  With History of -  Past Medical History  Diagnosis Date  . Hypertension   . Anxiety   . BPH (benign prostatic hyperplasia)   . Chronic combined systolic and diastolic CHF, NYHA class 2 ([place]) 04/17/2015  . CHF (congestive heart failure) ([place])   . Symptomatic sinus bradycardia     PPM Dr. Rayann Heman, 01/13/16, STJ device  . GERD (gastroesophageal reflux disease)   . Arthritis       Past Surgical History  Procedure Laterality Date  . Cardiac catheterization  2006    Negative / 4 years ago  . Ep implantable device N/A 01/13/2016    Procedure: Pacemaker Implant;  Surgeon: Thompson Grayer, MD;  Location: [facility];  Service: Cardiovascular;  Laterality: N/A;    in for   Chief Complaint  Patient presents with  . Code Stroke     HPI  Micheal Nicholson  is a 82 y.o. male, [employer] vet with hx of pafib with slow ventricular response and symptomatic bradycardia prompting placement of pacemaker on 01/13/2016, hx of htn, CHF, BPH, GERD and anxiety. Pt presents to ED today as code stroke. Pt states he awoke at 0400 feeling unsteady on his feet went back to bed and awake again at 0800 this am to use the restroom. States he was very unsteady on his feet prompting him to go sit on side of bed. Shortly thereafter, pt fell out of bed striking head. EMS reports dense left hemiplegia on their arrival to home and code stroke activated.   Pt has full recollection of event. Symptoms of left hemiplegia have improved since arrival to ED. Contraindicated for TPA as pt is on Eliquis. He denies recent fever, headache, dizziness, chest pain, sob, abdominal pain, n/v or diarrhea. CT head done on arrival with no acute findings, but  did show old left occipital infarct. Pt to be admitted for further evaluation and treatment.   Review of Systems   In addition to the HPI above,  No Fever-chills, No Headache, No changes with Vision or hearing, No problems swallowing food or Liquids, No Chest pain, Cough or Shortness of Breath, No Abdominal pain, No Nausea or Vomiting, Bowel movements are regular, No Blood in stool or Urine, No dysuria, No new skin rashes or bruises, No new joints pains-aches,  No recent weight gain or loss, A full 10 point Review of Systems was done, except as stated above, all other Review of Systems were negative.  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 Lives with son who is in poor health. Retried from [place]. No tobacco or etoh use.   Family History Family History  Problem Relation Age of Onset  . Pneumonia Father 52    died  . Lung cancer Brother   . Heart disease Brother   . Pancreatic  cancer Son   . Healthy Son   . Heart failure Son     Prior to Admission medications   Medication Sig Start Date End Date Taking? Authorizing Provider  ALPRAZolam Duanne Moron) 0.5 MG tablet TAKE 1 TABLET TWICE DAILY AS NEEDED 01/20/16   [name], MD  apixaban (ELIQUIS) 5 MG TABS tablet Take 1 tablet (5 mg total) by mouth 2 (two) times daily. 02/04/16   [name], MD  Camphor-Eucalyptus-Menthol (VICKS VAPORUB EX) Apply 1 application topically as needed (to help breathe).    Historical Provider, MD  carvedilol (COREG) 3.125 MG tablet Take 1 tablet (3.125 mg total) by mouth 2 (two) times daily with a meal. 01/21/16   Isaiah Serge, NP  Cholecalciferol (VITAMIN D PO) Take 1 tablet by mouth daily.    Historical Provider, MD  Dutasteride-Tamsulosin HCl 0.5-0.4 MG CAPS TAKE 1 CAPSULE BY MOUTH DAILY 10/20/15   [name], MD  fluticasone (FLONASE) 50 MCG/ACT nasal spray Place 2 sprays into both nostrils 2 (two) times daily.     Historical Provider, MD  furosemide (LASIX) 40 MG tablet Take 1 tablet (40  mg total) by mouth daily. 01/03/16   Delfina Redwood, MD  guaiFENesin (MUCINEX) 600 MG 12 hr tablet Take 1 tablet (600 mg total) by mouth 2 (two) times daily. 12/23/15   Renee A Kuneff, DO  KLOR-CON M10 10 MEQ tablet Take 1 tablet by mouth daily. 07/23/15   Historical Provider, MD  lisinopril (PRINIVIL,ZESTRIL) 40 MG tablet TAKE 1 TABLET BY MOUTH EVERY DAY 01/31/16   Mihai [facility], MD  lisinopril (PRINIVIL,ZESTRIL) 5 MG tablet TAKE 1 TABLET BY MOUTH EVERY DAY 02/18/16   [name], MD  omeprazole (PRILOSEC) 20 MG capsule TAKE ONE CAPSULE BY MOUTH EVERY DAY 09/09/15   [name], MD  pramipexole (MIRAPEX) 0.125 MG tablet TAKE 1 TABLET BY MOUTH AT BEDTIME 02/03/16   [name], MD  sertraline (ZOLOFT) 100 MG tablet TAKE 1.5 TABLETS BY MOUTH DAILY Patient taking differently: TAKE 1.5 TABLETS (150mg ) BY MOUTH DAILY 10/25/15   [name], MD  Vitamin D, Ergocalciferol, (DRISDOL) 50000 UNITS CAPS capsule Take 1 capsule (50,000 Units total) by mouth every 7 (seven) days. 03/08/15   [name], MD    Allergies  Allergen Reactions  . Chocolate Other (See Comments)    Acid reflux    Physical Exam  Vitals  Blood pressure 141/91, pulse 62, temperature 97.8 F (36.6 C), temperature source Temporal, resp. rate 18, height 5\' 10"  (1.778 m), weight 79.096 kg (174 lb 6 oz), SpO2 98 %.   General:  lying in bed in NAD,   Psych:  Normal affect and insight, Not Suicidal or Homicidal, Awake Alert, Oriented X 3.  Neuro:   Left hemiparesis much improved from arrival. Upper > lower. Tongue midline. Left sided gaze. DTR's symmetric throughout  ENT:  Ears and Eyes appear Normal, Conjunctivae clear, PER. Old bruising to left side of tongue  Neck:  Supple, No lymphadenopathy appreciated  Respiratory:  Symmetrical chest wall movement, Good air movement bilaterally, CTAB.  Cardiac:  Irregularly irregular. No m/r/g. No LE edema.  Abdomen:  Positive bowel sounds, Soft, Non tender,  Non distended,  No masses appreciated  Skin:  No Cyanosis, Normal Skin Turgor, No Skin Rash or Bruise.  Extremities:  Able to move all 4.  Data Review  CBC  Recent Labs Lab 03/02/16 0906 03/02/16 0916  WBC 5.8  --   HGB 12.3*  12.9*  HCT 35.8* 38.0*  PLT 150  --   MCV 86.9  --   MCH 29.9  --   MCHC 34.4  --   RDW 13.1  --   LYMPHSABS 1.0  --   MONOABS 0.5  --   EOSABS 0.1  --   BASOSABS 0.0  --     Chemistries   Recent Labs Lab 03/02/16 0906 03/02/16 0916  NA 139 138  K 4.4 4.3  CL 102 99*  CO2 24  --   GLUCOSE 109* 107*  BUN 15 19  CREATININE 1.29* 1.20  CALCIUM 9.3  --   AST 20  --   ALT 12*  --   ALKPHOS 54  --   BILITOT 1.2  --     estimated creatinine clearance is 39.7 mL/min (by C-G formula based on Cr of 1.2).  No results for input(s): TSH, T4TOTAL, T3FREE, THYROIDAB in the last 72 hours.  Invalid input(s): FREET3  Coagulation profile  Recent Labs Lab 03/02/16 0906  INR 1.52*    No results for input(s): DDIMER in the last 72 hours.  Cardiac Enzymes No results for input(s): CKMB, TROPONINI, MYOGLOBIN in the last 168 hours.  Invalid input(s): CK  Invalid input(s): POCBNP  Urinalysis    Component Value Date/Time   COLORURINE YELLOW 03/14/2015 [place] 03/14/2015 1608   LABSPEC 1.014 03/14/2015 1608   PHURINE 5.5 03/14/2015 [place] 03/14/2015 1608   HGBUR NEGATIVE 03/14/2015 1608   BILIRUBINUR NEGATIVE 03/14/2015 1608   BILIRUBINUR negative 11/10/2013 1713   KETONESUR NEGATIVE 03/14/2015 1608   PROTEINUR NEGATIVE 03/14/2015 1608   PROTEINUR negative 11/10/2013 1713   UROBILINOGEN 1.0 03/14/2015 1608   UROBILINOGEN 0.2 11/10/2013 1713   NITRITE NEGATIVE 03/14/2015 1608   NITRITE negative 11/10/2013 1713   LEUKOCYTESUR NEGATIVE 03/14/2015 1608    Imaging results:   Ct Head Wo Contrast  03/02/2016  CLINICAL DATA:  LEFT side weakness, RIGHT dominant gaze, history RIGHT-sided stroke, code  stroke, essential hypertension, CHF, former smoker, atrial fibrillation, cardiomyopathy, renal failure EXAM: CT HEAD WITHOUT CONTRAST CT CERVICAL SPINE WITHOUT CONTRAST TECHNIQUE: Multidetector CT imaging of the head and cervical spine was performed following the standard protocol without intravenous contrast. Multiplanar CT image reconstructions of the cervical spine were also generated. COMPARISON:  None FINDINGS: CT HEAD FINDINGS Generalized atrophy. Normal ventricular morphology. No midline shift or mass effect. Small old LEFT occipital infarct. Small vessel chronic ischemic changes of deep cerebral white matter. No intracranial hemorrhage, mass lesion, evidence acute infarction, or extra-axial fluid collections. Mucosal thickening BILATERAL maxillary sinuses. Bones demineralized. CT CERVICAL SPINE FINDINGS Diffuse osseous demineralization. Prevertebral soft tissues normal thickness. Multilevel disc space narrowing and minimal endplate spur formation. Multilevel facet degenerative changes. Minimal anterolisthesis C7-T1 likely degenerative. Incomplete posterior arch C1, normal variant. Head rotation to the RIGHT with minimal rotary subluxation at C1-C2. Vertebral body heights maintained without fracture or additional subluxation. Visualized skullbase intact. Lung apices clear. IMPRESSION: Atrophy with mild small vessel chronic ischemic changes of deep cerebral white matter. 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 No acute intracranial abnormalities. Osseous demineralization with degenerative disc and facet disease changes of the cervical spine as above. No acute abnormalities. Code Stroke results called to Dr. Nicole Kindred on 03/02/2016 at 0938 hours. Electronically Signed   By: Lavonia Dana M.D.   On: 03/02/2016 09:46   Ct Cervical Spine Wo Contrast  03/02/2016  CLINICAL DATA:  LEFT side weakness, RIGHT dominant gaze, history RIGHT-sided  stroke, code stroke, essential hypertension, CHF, former smoker, atrial  fibrillation, cardiomyopathy, renal failure EXAM: CT HEAD WITHOUT CONTRAST CT CERVICAL SPINE WITHOUT CONTRAST TECHNIQUE: Multidetector CT imaging of the head and cervical spine was performed following the standard protocol without intravenous contrast. Multiplanar CT image reconstructions of the cervical spine were also generated. COMPARISON:  None FINDINGS: CT HEAD FINDINGS Generalized atrophy. Normal ventricular morphology. No midline shift or mass effect. Small old LEFT occipital infarct. Small vessel chronic ischemic changes of deep cerebral white matter. No intracranial hemorrhage, mass lesion, evidence acute infarction, or extra-axial fluid collections. Mucosal thickening BILATERAL maxillary sinuses. Bones demineralized. CT CERVICAL SPINE FINDINGS Diffuse osseous demineralization. Prevertebral soft tissues normal thickness. Multilevel disc space narrowing and minimal endplate spur formation. Multilevel facet degenerative changes. Minimal anterolisthesis C7-T1 likely degenerative. Incomplete posterior arch C1, normal variant. Head rotation to the RIGHT with minimal rotary subluxation at C1-C2. Vertebral body heights maintained without fracture or additional subluxation. Visualized skullbase intact. Lung apices clear. IMPRESSION: Atrophy with mild small vessel chronic ischemic changes of deep cerebral white matter. 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 No acute intracranial abnormalities. Osseous demineralization with degenerative disc and facet disease changes of the cervical spine as above. No acute abnormalities. Code Stroke results called to Dr. Nicole Kindred on 03/02/2016 at 0938 hours. Electronically Signed   By: Lavonia Dana M.D.   On: 03/02/2016 09:46    My personal review of EKG: NSR, No ST changes noted.   Assessment & Plan  Active Problems:   Essential hypertension   BPH (benign prostatic hypertrophy)   GERD (gastroesophageal reflux disease)   Paroxysmal atrial fibrillation (HCC)   Chronic  combined systolic and diastolic CHF, NYHA class 2 (H[facility])   CAD (coronary artery disease)   Hemiplegia ([facility]CC)  Left hemiparesis, suspected CVA -initial CT negative (old infarct). No MRI d/t pacemaker -full stroke workup including 2Decho, AIC, FLP, PT/OT/SLP eval, monitor on tele -continue Eliquis  lymphadenopathy -incidental finding on neck CT -CT chest to further evaluate  Mild renal insufficiency/AKI -baseline ~1, but ARF in 12/2015 with creatinine 2.2 -will hold Lasix and ACEI for now. Gentle hydration. Check renal panel -monitor closely with contrast for CT  Afib, chronic -continue BB, eliquis  Essential hypertension -holding ACEI with above. May need to be off permantly  Anemia, mild -seemingly chronic -check anemia panel. Monitor  GERD On PPI  BPH Continue home meds.    DVT Prophylaxis: on Eliquis  AM Labs Ordered, also please review Full Orders  Family Communication:  Son at bedside on exam  Code Status:  Full code  Condition:  guarded  Time spent in minutes : [place], NP on 03/02/2016 at 10:44 AM Between 7am to 7pm - Pager - [phone] After 7pm go to [email or web address] - password [email or web address]  And look for the night coverage person covering me after hours  [facility]

## 2016-03-02 NOTE — Progress Notes (Signed)
Pt given 0.25 mg xanax. Pt left leg has a jerking motion. Pt states he needs to urinate, hesitancy noted. Pt unsuccessful to urinate in urinal with assist. Pt repositioned, RN will continue to monitor.

## 2016-03-02 NOTE — ED Notes (Signed)
Arrived via EMS 2 IV's established bilateral hand 18g. Fire reported let sided flaccid. Upon arrival patient alert answering and following commands appropriate able to move left lower extremity.

## 2016-03-02 NOTE — Consult Note (Signed)
Admission H&P    Chief Complaint: New onset left hemiplegia.  HPI: Micheal Nicholson is an 82 y.o. Nicholson with a history of atrial fibrillation on Eliquis, hypertension and CHF brought to the emergency room and code stroke status following onset of left hemiplegia. Patient indicated that he felt weakness and unsteady gait at 4 AM. Onset of left hemiplegia was at 8 AM. He was last known well at bedtime at 10 PM on 03/01/2016. CT scan of his head showed no acute intracranial abnormality. CT angiogram showed no large vessel occlusion. A high-grade right M3 stenosis was seen, however. High-grade stenosis of right vertebral V2 segment was noted. Abnormal 2 cm AP window lymphadenopathy, suspicious for malignancy was noted and follow-up CT of the chest with contrast is recommended. Patient's deficits improved fairly rapidly. NIH stroke score at the time of this evaluation was 7. At the end of this evaluation he had residual primarily proximal left upper extremity weakness as his most outstanding deficit.  LSN: 10 PM on 03/01/2016 tPA Given: No: On anticoagulation with Eliquis; rapidly improving deficits. mRankin:  Past Medical History  Diagnosis Date  . Hypertension   . Anxiety   . BPH (benign prostatic hyperplasia)   . Chronic combined systolic and diastolic CHF, NYHA class 2 ([place]) 04/17/2015  . CHF (congestive heart failure) ([place])   . Symptomatic sinus bradycardia     PPM Dr. Rayann Heman, 01/13/16, STJ device  . GERD (gastroesophageal reflux disease)   . Arthritis     Past Surgical History  Procedure Laterality Date  . Cardiac catheterization  2006    Negative / 4 years ago  . Ep implantable device N/A 01/13/2016    Procedure: Pacemaker Implant;  Surgeon: Thompson Grayer, MD;  Location: [facility];  Service: Cardiovascular;  Laterality: N/A;    Family History  Problem Relation Age of Onset  . Pneumonia Father 15    died  . Lung cancer Brother   . Heart disease Brother   . Pancreatic  cancer Son   . Healthy Son   . Heart failure Son    Social History:  reports that he has quit smoking. He has never used smokeless tobacco. He reports that he drinks alcohol. He reports that he does not use illicit drugs.  Allergies:  Allergies  Allergen Reactions  . Chocolate Other (See Comments)    Acid reflux   Medications: Agents preadmission medications were reviewed by me.  ROS: History obtained from the patient and his son.  General ROS: negative for - chills, fatigue, fever, night sweats, weight gain or weight loss Psychological ROS: negative for - behavioral disorder, hallucinations, memory difficulties, mood swings or suicidal ideation Ophthalmic ROS: negative for - blurry vision, double vision, eye pain or loss of vision ENT ROS: negative for - epistaxis, nasal discharge, oral lesions, sore throat, tinnitus or vertigo Allergy and Immunology ROS: negative for - hives or itchy/watery eyes Hematological and Lymphatic ROS: negative for - bleeding problems, bruising or swollen lymph nodes Endocrine ROS: negative for - galactorrhea, hair pattern changes, polydipsia/polyuria or temperature intolerance Respiratory ROS: negative for - cough, hemoptysis, shortness of breath or wheezing Cardiovascular ROS: negative for - chest pain, dyspnea on exertion, edema or irregular heartbeat Gastrointestinal ROS: negative for - abdominal pain, diarrhea, hematemesis, nausea/vomiting or stool incontinence Genito-Urinary ROS: negative for - dysuria, hematuria, incontinence or urinary frequency/urgency Musculoskeletal ROS: negative for - joint swelling or muscular weakness Neurological ROS: as noted in HPI Dermatological ROS: negative for rash and  skin lesion changes  Physical Examination: Blood pressure 141/91, pulse 62, temperature 97.8 F (36.6 C), temperature source Temporal, resp. rate 18, height _0  (1.778 m), weight 79.096 kg (174 lb 6 oz), SpO2 98 %.  HEENT-  Normocephalic, no  lesions, without obvious abnormality.  Normal external eye and conjunctiva.  Normal TM's bilaterally.  Normal auditory canals and external ears. Normal external nose, mucus membranes and septum.  Normal pharynx. Neck supple with no masses, nodes, nodules or enlargement. Cardiovascular - irregularly irregular rhythm, S1, S2 normal and no S3 or S4 Lungs - chest clear, no wheezing, rales, normal symmetric air entry Abdomen - soft, non-tender; bowel sounds normal; no masses,  no organomegaly Extremities - no joint deformities, effusion, or inflammation and no edema  Neurologic Examination: Mental Status: Alert, oriented, thought content appropriate.  Speech fluent without evidence of aphasia. Able to follow commands without difficulty. Cranial Nerves: II-left homonymous hemianopsia which subsequently resolved. III/IV/VI-Pupils were equal and reacted. Initial gaze preference to the right side which resolved.    V/VII-no facial numbness and no facial weakness. VIII-normal. X-normal speech. XI: trapezius strength/neck flexion strength normal bilaterally XII-midline tongue extension with normal strength. Motor: Moderately severe proximal and mild left distal upper extremity weakness; mild drift of left lower extremity; motor exam otherwise unremarkable Sensory: Reduced perception of tactile sensation over left extremities compared to right extremities. Deep Tendon Reflexes: Face to 1+ and symmetric. Plantars: Flexor on the right and extensor on the left Cerebellar: Normal finger-to-nose testing with use of right upper extremity.  Results for orders placed or performed during the hospital encounter of 03/02/16 (from the past 48 hour(s))  Ethanol     Status: None   Collection Time: 03/02/16  9:06 AM  Result Value Ref Range   Alcohol, Ethyl (B) <5 <5 mg/dL    Comment:        LOWEST DETECTABLE LIMIT FOR SERUM ALCOHOL IS 5 mg/dL FOR MEDICAL PURPOSES ONLY RESULT REPEATED AND VERIFIED    Protime-INR     Status: Abnormal   Collection Time: 03/02/16  9:06 AM  Result Value Ref Range   Prothrombin Time 18.4 (H) 11.6 - 15.2 seconds   INR 1.52 (H) 0.00 - 1.49  APTT     Status: Abnormal   Collection Time: 03/02/16  9:06 AM  Result Value Ref Range   aPTT 39 (H) 24 - 37 seconds    Comment:        IF BASELINE aPTT IS ELEVATED, SUGGEST PATIENT RISK ASSESSMENT BE USED TO DETERMINE APPROPRIATE ANTICOAGULANT THERAPY.   CBC     Status: Abnormal   Collection Time: 03/02/16  9:06 AM  Result Value Ref Range   WBC 5.8 4.0 - 10.5 K/uL   RBC 4.12 (L) 4.22 - 5.81 MIL/uL   Hemoglobin 12.3 (L) 13.0 - 17.0 g/dL   HCT 35.8 (L) 39.0 - 52.0 %   MCV 86.9 78.0 - 100.0 fL   MCH 29.9 26.0 - 34.0 pg   MCHC 34.4 30.0 - 36.0 g/dL   RDW 13.1 11.5 - 15.5 %   Platelets 150 150 - 400 K/uL  Differential     Status: None   Collection Time: 03/02/16  9:06 AM  Result Value Ref Range   Neutrophils Relative % 72 %   Neutro Abs 4.2 1.7 - 7.7 K/uL   Lymphocytes Relative 17 %   Lymphs Abs 1.0 0.7 - 4.0 K/uL   Monocytes Relative 9 %   Monocytes Absolute 0.5 0.1 - 1.0 K/uL  Eosinophils Relative 2 %   Eosinophils Absolute 0.1 0.0 - 0.7 K/uL   Basophils Relative 0 %   Basophils Absolute 0.0 0.0 - 0.1 K/uL  Comprehensive metabolic panel     Status: Abnormal   Collection Time: 03/02/16  9:06 AM  Result Value Ref Range   Sodium 139 135 - 145 mmol/L   Potassium 4.4 3.5 - 5.1 mmol/L   Chloride 102 101 - 111 mmol/L   CO2 24 22 - 32 mmol/L   Glucose, Bld 109 (H) 65 - 99 mg/dL   BUN 15 6 - 20 mg/dL   Creatinine, Ser 1.29 (H) 0.61 - 1.24 mg/dL   Calcium 9.3 8.9 - 10.3 mg/dL   Total Protein 6.2 (L) 6.5 - 8.1 g/dL   Albumin 3.7 3.5 - 5.0 g/dL   AST 20 15 - 41 U/L   ALT 12 (L) 17 - 63 U/L   Alkaline Phosphatase 54 38 - 126 U/L   Total Bilirubin 1.2 0.3 - 1.2 mg/dL   GFR calc non Af Amer 46 (L) >60 mL/min   GFR calc Af Amer 53 (L) >60 mL/min    Comment: (NOTE) The eGFR has been calculated using the  CKD EPI equation. This calculation has not been validated in all clinical situations. eGFR's persistently <60 mL/min signify possible Chronic Kidney Disease.    Anion gap 13 5 - 15  I-stat troponin, ED (not at [facility], [facility])     Status: None   Collection Time: 03/02/16  9:14 AM  Result Value Ref Range   Troponin i, poc 0.01 0.00 - 0.08 ng/mL   Comment 3            Comment: Due to the release kinetics of cTnI, a negative result within the first hours of the onset of symptoms does not rule out myocardial infarction with certainty. If myocardial infarction is still suspected, repeat the test at appropriate intervals.   I-Stat Chem 8, ED  (not at [facility], [facility])     Status: Abnormal   Collection Time: 03/02/16  9:16 AM  Result Value Ref Range   Sodium 138 135 - 145 mmol/L   Potassium 4.3 3.5 - 5.1 mmol/L   Chloride 99 (L) 101 - 111 mmol/L   BUN 19 6 - 20 mg/dL   Creatinine, Ser 1.20 0.61 - 1.24 mg/dL   Glucose, Bld 107 (H) 65 - 99 mg/dL   Calcium, Ion 1.11 (L) 1.13 - 1.30 mmol/L   TCO2 26 0 - 100 mmol/L   Hemoglobin 12.9 (L) 13.0 - 17.0 g/dL   HCT 38.0 (L) 39.0 - 52.0 %  CBG monitoring, ED     Status: Abnormal   Collection Time: 03/02/16  9:23 AM  Result Value Ref Range   Glucose-Capillary 107 (H) 65 - 99 mg/dL   Ct Angio Head W/cm &/or Wo Cm  03/02/2016  ADDENDUM REPORT: 03/02/2016 10:51 ADDENDUM: Study discussed by telephone with Dr. Wallie Char on 03/02/2016 at 1048 hours. Electronically Signed   By: Genevie Ann M.D.   On: 03/02/2016 10:51  03/02/2016  CLINICAL DATA:  82 year old Nicholson with left hemiplegia. Right gaze deviation. Code stroke. Initial encounter. EXAM: CT ANGIOGRAPHY HEAD AND NECK TECHNIQUE: Multidetector CT imaging of the head and neck was performed using the standard protocol during bolus administration of intravenous contrast. Multiplanar CT image reconstructions and MIPs were obtained to evaluate the vascular anatomy. Carotid stenosis measurements (when applicable) are  obtained utilizing NASCET criteria, using the distal internal carotid diameter as the  denominator. CONTRAST:  70m OMNIPAQUE IOHEXOL 350 MG/ML SOLN COMPARISON:  Head and cervical Spine CT without contrast 0914 hours today. FINDINGS: CTA NECK Skeleton: Partially visible healed appearing left lateral sixth rib fracture. Scoliosis and degenerative changes in the visible spine. Osteopenia. No acute osseous abnormality identified. Left greater than right paranasal sinus opacification. Other neck: Dependent ground-glass opacity in both lungs. Apical pulmonary septal thickening. Superimposed nodular superior segment right lower lobe subpleural opacity. Enlarged aortic 0 pulmonary window soft tissue most resembling malignant lymphadenopathy measuring 24 mm short axis (series 7, image 28). Nearby precarinal lymph nodes measuring up to 10 mm short axis. Negative thyroid, larynx, pharynx, parapharyngeal spaces, retropharyngeal space, sublingual space, submandibular glands, and parotid glands. Postoperative changes to the globes. Mild right side gaze deviation. No cervical lymphadenopathy. Aortic arch: 3 vessel arch configuration CT head ectatic thoracic aorta, 36 mm diameter in the proximal arch and 35 mm diameter proximal descending aorta. Right carotid system: Tortuous brachiocephalic artery and proximal right CCA without stenosis. Negative right CCA. Negative right carotid bifurcation, widely patent right ICA origin. Soft plaque in the posterior right ICA bulb without stenosis. Otherwise negative cervical right ICA. Left carotid system: Calcified plaque at the left CCA origin without stenosis. Tortuous proximal left CCA. Mild calcified plaque at the left carotid bifurcation which is widely patent. Mildly tortuous but otherwise negative cervical left ICA. Vertebral arteries: Tortuous proximal right subclavian artery with a kinked appearance (series 602, image 129). An calcified plaque, but otherwise no stenosis. Normal right  vertebral artery origin. Short segment High-grade stenosis right V2 segment (series 7, image 85). At the C4 level. This does not resemble dissection, but rather atherosclerotic stenosis, or less likely related to adjacent cervical spine degeneration. Distally the cervical right vertebral artery remains patent and is without additional stenosis to the skullbase. Tortuous proximal left subclavian artery with a kinked appearance (sagittal series 603, image 120) and calcified plaque but otherwise no stenosis. Calcified plaque near the left vertebral artery origin, but no associated stenosis. Tortuous left V1 segment. Fairly codominant vertebral arteries. Tortuous left V3 segment without stenosis to the skullbase. CTA HEAD Posterior circulation: Codominant distal vertebral arteries. Normal right PICA origin. The right vertebral artery is tapered distal to the PICA, but without hemodynamically significant stenosis. No stenosis of the distal left vertebral artery. Patent vertebrobasilar junction. No basilar artery stenosis. Normal SCA and PCA origins. Posterior communicating arteries are diminutive or absent. Bilateral PCA branches are within normal limits. Anterior circulation: Both ICA siphons are patent. Mild for age bilateral siphon calcified plaque. No siphon stenosis. Ophthalmic artery origins are within normal limits. Normal carotid termini. ACA origins, anterior communicating artery, and bilateral ACA branches are within normal limits. Left MCA M1 segment, bifurcation, and left MCA branches are within normal limits. Right MCA M1 segment and bifurcation are patent. The major M2 branches appear patent and within normal limits. There is multifocal segment irregularity and high-grade stenosis in at a middle sylvian M3 branch best seen on series 606, image 12. Otherwise distal bilateral MCA branches appear symmetric. Venous sinuses: Patent. Anatomic variants: None. IMPRESSION: 1. Positive for right MCA M3 lesions in the  middle sylvian division. Negative for emergent large vessel occlusion. No more proximal anterior circulation stenosis. 2. High-grade stenosis right vertebral artery V2 segment, favor due to soft atherosclerotic plaque. Otherwise negative posterior circulation. 3. Abnormal 2 cm AP window lymphadenopathy most suspicious for malignant nodal disease. Recommend follow-up chest CT with IV contrast to evaluate further. 4. Ectatic thoracic aorta  and tortuous proximal great vessels. Electronically Signed: By: Genevie Ann M.D. On: 03/02/2016 10:44   Ct Head Wo Contrast  03/02/2016  CLINICAL DATA:  LEFT side weakness, RIGHT dominant gaze, history RIGHT-sided stroke, code stroke, essential hypertension, CHF, former smoker, atrial fibrillation, cardiomyopathy, renal failure EXAM: CT HEAD WITHOUT CONTRAST CT CERVICAL SPINE WITHOUT CONTRAST TECHNIQUE: Multidetector CT imaging of the head and cervical spine was performed following the standard protocol without intravenous contrast. Multiplanar CT image reconstructions of the cervical spine were also generated. COMPARISON:  None FINDINGS: CT HEAD FINDINGS Generalized atrophy. Normal ventricular morphology. No midline shift or mass effect. Small old LEFT occipital infarct. Small vessel chronic ischemic changes of deep cerebral white matter. No intracranial hemorrhage, mass lesion, evidence acute infarction, or extra-axial fluid collections. Mucosal thickening BILATERAL maxillary sinuses. Bones demineralized. CT CERVICAL SPINE FINDINGS Diffuse osseous demineralization. Prevertebral soft tissues normal thickness. Multilevel disc space narrowing and minimal endplate spur formation. Multilevel facet degenerative changes. Minimal anterolisthesis C7-T1 likely degenerative. Incomplete posterior arch C1, normal variant. Head rotation to the RIGHT with minimal rotary subluxation at C1-C2. Vertebral body heights maintained without fracture or additional subluxation. Visualized skullbase intact.  Lung apices clear. IMPRESSION: Atrophy with mild small vessel chronic ischemic changes of deep cerebral white matter. 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 No acute intracranial abnormalities. Osseous demineralization with degenerative disc and facet disease changes of the cervical spine as above. No acute abnormalities. Code Stroke results called to Dr. Nicole Kindred on 03/02/2016 at 0938 hours. Electronically Signed   By: Lavonia Dana M.D.   On: 03/02/2016 09:46   Ct Angio Neck W/cm &/or Wo/cm  03/02/2016  ADDENDUM REPORT: 03/02/2016 10:51 ADDENDUM: Study discussed by telephone with Dr. Wallie Char on 03/02/2016 at 1048 hours. Electronically Signed   By: Genevie Ann M.D.   On: 03/02/2016 10:51  03/02/2016  CLINICAL DATA:  82 year old Nicholson with left hemiplegia. Right gaze deviation. Code stroke. Initial encounter. EXAM: CT ANGIOGRAPHY HEAD AND NECK TECHNIQUE: Multidetector CT imaging of the head and neck was performed using the standard protocol during bolus administration of intravenous contrast. Multiplanar CT image reconstructions and MIPs were obtained to evaluate the vascular anatomy. Carotid stenosis measurements (when applicable) are obtained utilizing NASCET criteria, using the distal internal carotid diameter as the denominator. CONTRAST:  39m OMNIPAQUE IOHEXOL 350 MG/ML SOLN COMPARISON:  Head and cervical Spine CT without contrast 0914 hours today. FINDINGS: CTA NECK Skeleton: Partially visible healed appearing left lateral sixth rib fracture. Scoliosis and degenerative changes in the visible spine. Osteopenia. No acute osseous abnormality identified. Left greater than right paranasal sinus opacification. Other neck: Dependent ground-glass opacity in both lungs. Apical pulmonary septal thickening. Superimposed nodular superior segment right lower lobe subpleural opacity. Enlarged aortic 0 pulmonary window soft tissue most resembling malignant lymphadenopathy measuring 24 mm short axis (series 7, image 28).  Nearby precarinal lymph nodes measuring up to 10 mm short axis. Negative thyroid, larynx, pharynx, parapharyngeal spaces, retropharyngeal space, sublingual space, submandibular glands, and parotid glands. Postoperative changes to the globes. Mild right side gaze deviation. No cervical lymphadenopathy. Aortic arch: 3 vessel arch configuration CT head ectatic thoracic aorta, 36 mm diameter in the proximal arch and 35 mm diameter proximal descending aorta. Right carotid system: Tortuous brachiocephalic artery and proximal right CCA without stenosis. Negative right CCA. Negative right carotid bifurcation, widely patent right ICA origin. Soft plaque in the posterior right ICA bulb without stenosis. Otherwise negative cervical right ICA. Left carotid system: Calcified plaque at the left CCA origin without  stenosis. Tortuous proximal left CCA. Mild calcified plaque at the left carotid bifurcation which is widely patent. Mildly tortuous but otherwise negative cervical left ICA. Vertebral arteries: Tortuous proximal right subclavian artery with a kinked appearance (series 602, image 129). An calcified plaque, but otherwise no stenosis. Normal right vertebral artery origin. Short segment High-grade stenosis right V2 segment (series 7, image 85). At the C4 level. This does not resemble dissection, but rather atherosclerotic stenosis, or less likely related to adjacent cervical spine degeneration. Distally the cervical right vertebral artery remains patent and is without additional stenosis to the skullbase. Tortuous proximal left subclavian artery with a kinked appearance (sagittal series 603, image 120) and calcified plaque but otherwise no stenosis. Calcified plaque near the left vertebral artery origin, but no associated stenosis. Tortuous left V1 segment. Fairly codominant vertebral arteries. Tortuous left V3 segment without stenosis to the skullbase. CTA HEAD Posterior circulation: Codominant distal vertebral arteries.  Normal right PICA origin. The right vertebral artery is tapered distal to the PICA, but without hemodynamically significant stenosis. No stenosis of the distal left vertebral artery. Patent vertebrobasilar junction. No basilar artery stenosis. Normal SCA and PCA origins. Posterior communicating arteries are diminutive or absent. Bilateral PCA branches are within normal limits. Anterior circulation: Both ICA siphons are patent. Mild for age bilateral siphon calcified plaque. No siphon stenosis. Ophthalmic artery origins are within normal limits. Normal carotid termini. ACA origins, anterior communicating artery, and bilateral ACA branches are within normal limits. Left MCA M1 segment, bifurcation, and left MCA branches are within normal limits. Right MCA M1 segment and bifurcation are patent. The major M2 branches appear patent and within normal limits. There is multifocal segment irregularity and high-grade stenosis in at a middle sylvian M3 branch best seen on series 606, image 12. Otherwise distal bilateral MCA branches appear symmetric. Venous sinuses: Patent. Anatomic variants: None. IMPRESSION: 1. Positive for right MCA M3 lesions in the middle sylvian division. Negative for emergent large vessel occlusion. No more proximal anterior circulation stenosis. 2. High-grade stenosis right vertebral artery V2 segment, favor due to soft atherosclerotic plaque. Otherwise negative posterior circulation. 3. Abnormal 2 cm AP window lymphadenopathy most suspicious for malignant nodal disease. Recommend follow-up chest CT with IV contrast to evaluate further. 4. Ectatic thoracic aorta and tortuous proximal great vessels. Electronically Signed: By: Genevie Ann M.D. On: 03/02/2016 10:44   Ct Cervical Spine Wo Contrast  03/02/2016  CLINICAL DATA:  LEFT side weakness, RIGHT dominant gaze, history RIGHT-sided stroke, code stroke, essential hypertension, CHF, former smoker, atrial fibrillation, cardiomyopathy, renal failure EXAM: CT  HEAD WITHOUT CONTRAST CT CERVICAL SPINE WITHOUT CONTRAST TECHNIQUE: Multidetector CT imaging of the head and cervical spine was performed following the standard protocol without intravenous contrast. Multiplanar CT image reconstructions of the cervical spine were also generated. COMPARISON:  None FINDINGS: CT HEAD FINDINGS Generalized atrophy. Normal ventricular morphology. No midline shift or mass effect. Small old LEFT occipital infarct. Small vessel chronic ischemic changes of deep cerebral white matter. No intracranial hemorrhage, mass lesion, evidence acute infarction, or extra-axial fluid collections. Mucosal thickening BILATERAL maxillary sinuses. Bones demineralized. CT CERVICAL SPINE FINDINGS Diffuse osseous demineralization. Prevertebral soft tissues normal thickness. Multilevel disc space narrowing and minimal endplate spur formation. Multilevel facet degenerative changes. Minimal anterolisthesis C7-T1 likely degenerative. Incomplete posterior arch C1, normal variant. Head rotation to the RIGHT with minimal rotary subluxation at C1-C2. Vertebral body heights maintained without fracture or additional subluxation. Visualized skullbase intact. Lung apices clear. IMPRESSION: Atrophy with mild small vessel chronic  ischemic changes of deep cerebral white matter. 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 No acute intracranial abnormalities. Osseous demineralization with degenerative disc and facet disease changes of the cervical spine as above. No acute abnormalities. Code Stroke results called to Dr. Nicole Kindred on 03/02/2016 at 0938 hours. Electronically Signed   By: Lavonia Dana M.D.   On: 03/02/2016 09:46    Assessment: 82 y.o. Nicholson with atrial fibrillation and hypertension presenting with probable right MCA territory acute ischemic infarction.  Stroke Risk Factors - atrial fibrillation and hypertension  Plan: 1. HgbA1c, fasting lipid panel 2. PT consult, OT consult, Speech consult 3. Echocardiogram 4.  Prophylactic therapy- anticoagulation with Eliquis  5. Risk factor modification 6. Telemetry monitoring 7. CT of the chest with IV contrast to further evaluate mediastinal adenopathy  C.R. Nicole Kindred, MD [facility] [phone]  03/02/2016, 10:59 AM

## 2016-03-02 NOTE — Progress Notes (Signed)
Pt states his "nerves are just tore up, I can't even eat." Pt states he takes xanax at home for nerves and sleep. 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 RN attempted therapeutic communication, distraction effective momentarily. RN will continue to monitor.

## 2016-03-02 NOTE — ED Notes (Signed)
[id number] 

## 2016-03-03 ENCOUNTER — Inpatient Hospital Stay (HOSPITAL_COMMUNITY): Payer: Commercial Managed Care - HMO

## 2016-03-03 DIAGNOSIS — I5042 Chronic combined systolic (congestive) and diastolic (congestive) heart failure: Secondary | ICD-10-CM

## 2016-03-03 DIAGNOSIS — N400 Enlarged prostate without lower urinary tract symptoms: Secondary | ICD-10-CM

## 2016-03-03 DIAGNOSIS — I2510 Atherosclerotic heart disease of native coronary artery without angina pectoris: Secondary | ICD-10-CM

## 2016-03-03 DIAGNOSIS — I63131 Cerebral infarction due to embolism of right carotid artery: Secondary | ICD-10-CM

## 2016-03-03 DIAGNOSIS — I10 Essential (primary) hypertension: Secondary | ICD-10-CM

## 2016-03-03 DIAGNOSIS — D649 Anemia, unspecified: Secondary | ICD-10-CM | POA: Insufficient documentation

## 2016-03-03 DIAGNOSIS — M792 Neuralgia and neuritis, unspecified: Secondary | ICD-10-CM | POA: Diagnosis present

## 2016-03-03 DIAGNOSIS — G8194 Hemiplegia, unspecified affecting left nondominant side: Secondary | ICD-10-CM

## 2016-03-03 DIAGNOSIS — I480 Paroxysmal atrial fibrillation: Secondary | ICD-10-CM

## 2016-03-03 DIAGNOSIS — I6789 Other cerebrovascular disease: Secondary | ICD-10-CM

## 2016-03-03 DIAGNOSIS — G8190 Hemiplegia, unspecified affecting unspecified side: Secondary | ICD-10-CM

## 2016-03-03 DIAGNOSIS — D62 Acute posthemorrhagic anemia: Secondary | ICD-10-CM

## 2016-03-03 DIAGNOSIS — I6340 Cerebral infarction due to embolism of unspecified cerebral artery: Secondary | ICD-10-CM | POA: Diagnosis present

## 2016-03-03 DIAGNOSIS — D696 Thrombocytopenia, unspecified: Secondary | ICD-10-CM

## 2016-03-03 LAB — CBC
HCT: 36.4 % — ABNORMAL LOW (ref 39.0–52.0)
Hemoglobin: 11.9 g/dL — ABNORMAL LOW (ref 13.0–17.0)
MCH: 28.5 pg (ref 26.0–34.0)
MCHC: 32.7 g/dL (ref 30.0–36.0)
MCV: 87.3 fL (ref 78.0–100.0)
PLATELETS: 149 10*3/uL — AB (ref 150–400)
RBC: 4.17 MIL/uL — ABNORMAL LOW (ref 4.22–5.81)
RDW: 13.1 % (ref 11.5–15.5)
WBC: 5 10*3/uL (ref 4.0–10.5)

## 2016-03-03 LAB — TROPONIN I
TROPONIN I: 0.03 ng/mL (ref <0.031)
TROPONIN I: 0.04 ng/mL — AB (ref <0.031)
Troponin I: 0.04 ng/mL — ABNORMAL HIGH (ref <0.031)
Troponin I: 0.04 ng/mL — ABNORMAL HIGH (ref <0.031)

## 2016-03-03 LAB — FOLATE: FOLATE: 17.6 ng/mL (ref >5.9)

## 2016-03-03 LAB — BASIC METABOLIC PANEL
Anion gap: 10 (ref 5–15)
BUN: 16 mg/dL (ref 6–20)
CALCIUM: 9 mg/dL (ref 8.9–10.3)
CO2: 26 mmol/L (ref 22–32)
CREATININE: 1.22 mg/dL (ref 0.61–1.24)
Chloride: 101 mmol/L (ref 101–111)
GFR calc Af Amer: 57 mL/min — ABNORMAL LOW (ref >60)
GFR, EST NON AFRICAN AMERICAN: 49 mL/min — AB (ref >60)
GLUCOSE: 108 mg/dL — AB (ref 65–99)
POTASSIUM: 4.2 mmol/L (ref 3.5–5.1)
SODIUM: 137 mmol/L (ref 135–145)

## 2016-03-03 LAB — LIPID PANEL
Cholesterol: 125 mg/dL (ref 0–200)
HDL: 29 mg/dL — AB (ref >40)
LDL CALC: 84 mg/dL (ref 0–99)
TRIGLYCERIDES: 59 mg/dL (ref <150)
Total CHOL/HDL Ratio: 4.3 RATIO
VLDL: 12 mg/dL (ref 0–40)

## 2016-03-03 LAB — ECHOCARDIOGRAM COMPLETE
Height: 70 in
WEIGHTICAEL: 2790 [oz_av]

## 2016-03-03 MED ORDER — PRAMIPEXOLE DIHYDROCHLORIDE 0.125 MG PO TABS
0.2500 mg | ORAL_TABLET | Freq: Every day | ORAL | Status: DC
  Administered 2016-03-03 – 2016-03-05 (×3): 0.25 mg via ORAL
  Filled 2016-03-03 (×3): qty 2

## 2016-03-03 MED ORDER — ATORVASTATIN CALCIUM 10 MG PO TABS
5.0000 mg | ORAL_TABLET | Freq: Every day | ORAL | Status: DC
  Administered 2016-03-03 – 2016-03-05 (×3): 5 mg via ORAL
  Filled 2016-03-03 (×3): qty 1

## 2016-03-03 MED ORDER — ZOLPIDEM TARTRATE 5 MG PO TABS
5.0000 mg | ORAL_TABLET | Freq: Every evening | ORAL | Status: DC | PRN
  Administered 2016-03-03: 5 mg via ORAL
  Filled 2016-03-03: qty 1

## 2016-03-03 MED ORDER — GABAPENTIN 100 MG PO CAPS
100.0000 mg | ORAL_CAPSULE | Freq: Three times a day (TID) | ORAL | Status: DC
  Administered 2016-03-03 – 2016-03-04 (×3): 100 mg via ORAL
  Filled 2016-03-03 (×3): qty 1

## 2016-03-03 MED ORDER — IOHEXOL 300 MG/ML  SOLN: 80.0000 mL | Freq: Once | INTRAMUSCULAR | Status: DC | PRN

## 2016-03-03 NOTE — Consult Note (Signed)
Physical Medicine and Rehabilitation Consult Reason for Consult: Right brain infarct Referring Physician: [facility]   HPI: Micheal Nicholson is a 82 y.o. right handed male with history of hypertension, chronic combined systolic and diastolic congestive heart failure, PAF with pacemaker 01/13/2016 maintained on Eliquis. Patient lives with his son who is disabled and is his caregiver for him as needed. He has another son in the area but unable to provide 24-hour assistance. Patient reported to be independent prior to admission but would use a cane when needed in the community. One level home with 2 steps to entry. Presented 03/02/2016 with acute onset of left-sided weakness. Cranial CT scan showed old left occipital PCA territory infarct no acute changes. MRI not completed due to pacemaker. Echocardiogram with ejection fraction of 30% with reduced systolic function. CT of head and neck negative for emergent large vessel occlusion. Noted high-grade stenosis right vertebral artery V2 segment. Neuro follow-up suspect right brain infarct embolic secondary to known atrial fibrillation. Patient did not receive TPA. Currently remains on Eliquis for CVA prophylaxis in light of atrial fibrillation. Tolerating a regular diet. Physical therapy evaluation completed 03/03/2016 with recommendations of physical medicine rehabilitation consult.   Review of Systems  Constitutional: Negative for fever and chills.  HENT: Negative for hearing loss.   Eyes: Negative for blurred vision and double vision.  Respiratory: Negative for cough and shortness of breath.   Cardiovascular: Positive for palpitations and leg swelling. Negative for chest pain.  Gastrointestinal: Positive for constipation. Negative for nausea and vomiting.       GERD  Genitourinary: Positive for urgency. Negative for dysuria and hematuria.  Musculoskeletal: Positive for myalgias and joint pain.  Skin: Negative for rash.  Neurological:  Positive for weakness. Negative for seizures and headaches.  Psychiatric/Behavioral:       Anxiety  All other systems reviewed and are negative.  Past Medical History  Diagnosis Date  . Hypertension   . Anxiety   . BPH (benign prostatic hyperplasia)   . Chronic combined systolic and diastolic CHF, NYHA class 2 ([place]) 04/17/2015  . CHF (congestive heart failure) ([place])   . Symptomatic sinus bradycardia     PPM Dr. Rayann Heman, 01/13/16, STJ device  . GERD (gastroesophageal reflux disease)   . Arthritis    Past Surgical History  Procedure Laterality Date  . Cardiac catheterization  2006    Negative / 4 years ago  . Ep implantable device N/A 01/13/2016    Procedure: Pacemaker Implant;  Surgeon: Thompson Grayer, MD;  Location: [facility];  Service: Cardiovascular;  Laterality: N/A;   Family History  Problem Relation Age of Onset  . Pneumonia Father 56    died  . Lung cancer Brother   . Heart disease Brother   . Pancreatic cancer Son   . Healthy Son   . Heart failure Son    Social History:  reports that he has quit smoking. He has never used smokeless tobacco. He reports that he drinks alcohol. He reports that he does not use illicit drugs. Allergies:  Allergies  Allergen Reactions  . Chocolate Other (See Comments)    Acid reflux   Medications Prior to Admission  Medication Sig Dispense Refill  . ALPRAZolam (XANAX) 0.5 MG tablet TAKE 1 TABLET TWICE DAILY AS NEEDED (Patient taking differently: TAKE 1 TABLET TWICE DAILY AS NEEDED FOR ANXIETY) 60 tablet 2  . apixaban (ELIQUIS) 5 MG TABS tablet Take 1 tablet (5 mg total) by mouth 2 (  two) times daily. 60 tablet 5  . Camphor-Eucalyptus-Menthol (VICKS VAPORUB EX) Apply 1 application topically daily as needed (to help breathe).     . carvedilol (COREG) 3.125 MG tablet Take 1 tablet (3.125 mg total) by mouth 2 (two) times daily with a meal. 60 tablet 3  . Dutasteride-Tamsulosin HCl 0.5-0.4 MG CAPS TAKE 1 CAPSULE BY MOUTH DAILY 90 capsule  3  . fluticasone (FLONASE) 50 MCG/ACT nasal spray Place 2 sprays into both nostrils 2 (two) times daily.     . furosemide (LASIX) 40 MG tablet Take 1 tablet (40 mg total) by mouth daily. 60 tablet 11  . guaiFENesin (MUCINEX) 600 MG 12 hr tablet Take 1 tablet (600 mg total) by mouth 2 (two) times daily. 30 tablet 0  . KLOR-CON M10 10 MEQ tablet Take 1 tablet by mouth daily.    Marland Kitchen lisinopril (PRINIVIL,ZESTRIL) 5 MG tablet TAKE 1 TABLET BY MOUTH EVERY DAY 30 tablet 11  . omeprazole (PRILOSEC) 20 MG capsule TAKE ONE CAPSULE BY MOUTH EVERY DAY 30 capsule 5  . pramipexole (MIRAPEX) 0.125 MG tablet TAKE 1 TABLET BY MOUTH AT BEDTIME 30 tablet 5  . sertraline (ZOLOFT) 100 MG tablet TAKE 1.5 TABLETS BY MOUTH DAILY (Patient taking differently: TAKE 1.5 TABLETS (150mg ) BY MOUTH DAILY) 45 tablet 3  . Vitamin D, Ergocalciferol, (DRISDOL) 50000 UNITS CAPS capsule Take 1 capsule (50,000 Units total) by mouth every 7 (seven) days. 30 capsule 0    Home: Home Living Family/patient expects to be discharged to:: Private residence Living Arrangements: Children Available Help at Discharge: Family Type of Home: House Home Access: Stairs to enter Technical brewer of Steps: 2 Entrance Stairs-Rails:  (unable to recall which side but states there is a handrail) Home Layout: One level Additional Comments: Pt lives with son whom is disabled and he is a caregiver for. Pt reports his other son can help as needed but not sure if he would be able to stay with pt 24/7 at this time  Functional History: Prior Function Level of Independence: Independent Comments: Pt reports being very active PTA, still driving and taking care of himself. He reports that he works out and [employer] daily Functional Status:  Mobility: Bed Mobility Overal bed mobility: Needs Assistance Bed Mobility: Rolling, Supine to Sit Rolling: Mod assist Supine to sit: Max assist, HOB elevated General bed mobility comments: Cues for technique  and required assist for LLE and trunk elevation Transfers Overall transfer level: Needs assistance Equipment used: 2 person hand held assist Transfers: Stand Pivot Transfers Stand pivot transfers: +2 physical assistance, From elevated surface General transfer comment: 3 attempted for sit <> stand from elevated EOB with total A but pt unable to get into full upright standing. Required 2 person assist for final sit to stand and pivot into recliner. Unable to advance LLE during transfer. Ambulation/Gait General Gait Details: unsafe to attempt at this time    ADL:    Cognition: Cognition Overall Cognitive Status: Impaired/Different from baseline Orientation Level: Oriented X4 Cognition Arousal/Alertness: Awake/alert Behavior During Therapy: WFL for tasks assessed/performed Overall Cognitive Status: Impaired/Different from baseline Area of Impairment: Memory Memory: Decreased short-term memory General Comments: Pt reports having a hard time remembering things day to day since admission. Overall able to follow all commands and generally aware of situation and impairments. He did need to be reminded that he likely will need further therapy before returning home which we discussed towards the beginning of the session.   Blood pressure 120/68, pulse 62,  temperature 97.9 F (36.6 C), temperature source Oral, resp. rate 20, height 5\' 10"  (1.778 m), weight 79.096 kg (174 lb 6 oz), SpO2 100 %. Physical Exam  Vitals reviewed. Constitutional: He is oriented to person, place, and time. He appears well-developed and well-nourished.  HENT:  Head: Normocephalic.  Facial abrasions  Eyes: EOM are normal. Right eye exhibits no discharge. Left eye exhibits no discharge.  Neck: Normal range of motion. Neck supple. No thyromegaly present.  Cardiovascular:  Irregularly irregular  Respiratory: Effort normal and breath sounds normal. No respiratory distress.  +[place]  GI: Soft. Bowel sounds are normal. He  exhibits no distension.  Musculoskeletal: He exhibits no edema or tenderness.  Neurological: He is alert and oriented to person, place, and time.  Fair awareness of his deficits and follow simple commands Motor: RUE/RLE: 4+/5 proximal to distal LUE: 0/5 LLE: 4-/5 proximal to distal Sensation intact to light touch DTRs symmetric   Skin: Skin is warm and dry.  Scattered abrasions  Psychiatric: He has a normal mood and affect. His behavior is normal.    Results for orders placed or performed during the hospital encounter of 03/02/16 (from the past 24 hour(s))  Troponin I     Status: None   Collection Time: 03/02/16  5:19 PM  Result Value Ref Range   Troponin I <0.03 <0.031 ng/mL  Renal function panel     Status: Abnormal   Collection Time: 03/02/16  5:19 PM  Result Value Ref Range   Sodium 138 135 - 145 mmol/L   Potassium 4.7 3.5 - 5.1 mmol/L   Chloride 101 101 - 111 mmol/L   CO2 24 22 - 32 mmol/L   Glucose, Bld 116 (H) 65 - 99 mg/dL   BUN 15 6 - 20 mg/dL   Creatinine, Ser 1.28 (H) 0.61 - 1.24 mg/dL   Calcium 9.2 8.9 - 10.3 mg/dL   Phosphorus 4.5 2.5 - 4.6 mg/dL   Albumin 3.9 3.5 - 5.0 g/dL   GFR calc non Af Amer 46 (L) >60 mL/min   GFR calc Af Amer 54 (L) >60 mL/min   Anion gap 13 5 - 15  Vitamin B12     Status: None   Collection Time: 03/02/16  5:19 PM  Result Value Ref Range   Vitamin B-12 833 180 - 914 pg/mL  Folate     Status: None   Collection Time: 03/02/16  5:19 PM  Result Value Ref Range   Folate 17.6 >5.9 ng/mL  Iron and TIBC     Status: Abnormal   Collection Time: 03/02/16  5:19 PM  Result Value Ref Range   Iron 22 (L) 45 - 182 ug/dL   TIBC 253 250 - 450 ug/dL   Saturation Ratios 9 (L) 17.9 - 39.5 %   UIBC 231 ug/dL  Ferritin     Status: None   Collection Time: 03/02/16  5:19 PM  Result Value Ref Range   Ferritin 313 24 - 336 ng/mL  Reticulocytes     Status: None   Collection Time: 03/02/16  5:19 PM  Result Value Ref Range   Retic Ct Pct 1.4 0.4 - 3.1  %   RBC. 4.36 4.22 - 5.81 MIL/uL   Retic Count, Manual 61.0 19.0 - 186.0 K/uL  Troponin I     Status: Abnormal   Collection Time: 03/03/16 12:05 AM  Result Value Ref Range   Troponin I 0.04 (H) <0.031 ng/mL  Lipid panel     Status: Abnormal  Collection Time: 03/03/16  6:33 AM  Result Value Ref Range   Cholesterol 125 0 - 200 mg/dL   Triglycerides 59 <150 mg/dL   HDL 29 (L) >40 mg/dL   Total CHOL/HDL Ratio 4.3 RATIO   VLDL 12 0 - 40 mg/dL   LDL Cholesterol 84 0 - 99 mg/dL  Troponin I     Status: Abnormal   Collection Time: 03/03/16  6:33 AM  Result Value Ref Range   Troponin I 0.04 (H) <0.031 ng/mL  CBC     Status: Abnormal   Collection Time: 03/03/16  6:33 AM  Result Value Ref Range   WBC 5.0 4.0 - 10.5 K/uL   RBC 4.17 (L) 4.22 - 5.81 MIL/uL   Hemoglobin 11.9 (L) 13.0 - 17.0 g/dL   HCT 36.4 (L) 39.0 - 52.0 %   MCV 87.3 78.0 - 100.0 fL   MCH 28.5 26.0 - 34.0 pg   MCHC 32.7 30.0 - 36.0 g/dL   RDW 13.1 11.5 - 15.5 %   Platelets 149 (L) 150 - 400 K/uL  Basic metabolic panel     Status: Abnormal   Collection Time: 03/03/16  6:33 AM  Result Value Ref Range   Sodium 137 135 - 145 mmol/L   Potassium 4.2 3.5 - 5.1 mmol/L   Chloride 101 101 - 111 mmol/L   CO2 26 22 - 32 mmol/L   Glucose, Bld 108 (H) 65 - 99 mg/dL   BUN 16 6 - 20 mg/dL   Creatinine, Ser 1.22 0.61 - 1.24 mg/dL   Calcium 9.0 8.9 - 10.3 mg/dL   GFR calc non Af Amer 49 (L) >60 mL/min   GFR calc Af Amer 57 (L) >60 mL/min   Anion gap 10 5 - 15  Troponin I     Status: Abnormal   Collection Time: 03/03/16 10:50 AM  Result Value Ref Range   Troponin I 0.04 (H) <0.031 ng/mL   Ct Angio Head W/cm &/or Wo Cm  03/02/2016  ADDENDUM REPORT: 03/02/2016 10:51 ADDENDUM: Study discussed by telephone with Dr. Wallie Char on 03/02/2016 at 1048 hours. Electronically Signed   By: Genevie Ann M.D.   On: 03/02/2016 10:51  03/02/2016  CLINICAL DATA:  82 year old male with left hemiplegia. Right gaze deviation. Code stroke. Initial  encounter. EXAM: CT ANGIOGRAPHY HEAD AND NECK TECHNIQUE: Multidetector CT imaging of the head and neck was performed using the standard protocol during bolus administration of intravenous contrast. Multiplanar CT image reconstructions and MIPs were obtained to evaluate the vascular anatomy. Carotid stenosis measurements (when applicable) are obtained utilizing NASCET criteria, using the distal internal carotid diameter as the denominator. CONTRAST:  15mL OMNIPAQUE IOHEXOL 350 MG/ML SOLN COMPARISON:  Head and cervical Spine CT without contrast 0914 hours today. FINDINGS: CTA NECK Skeleton: Partially visible healed appearing left lateral sixth rib fracture. Scoliosis and degenerative changes in the visible spine. Osteopenia. No acute osseous abnormality identified. Left greater than right paranasal sinus opacification. Other neck: Dependent ground-glass opacity in both lungs. Apical pulmonary septal thickening. Superimposed nodular superior segment right lower lobe subpleural opacity. Enlarged aortic 0 pulmonary window soft tissue most resembling malignant lymphadenopathy measuring 24 mm short axis (series 7, image 28). Nearby precarinal lymph nodes measuring up to 10 mm short axis. Negative thyroid, larynx, pharynx, parapharyngeal spaces, retropharyngeal space, sublingual space, submandibular glands, and parotid glands. Postoperative changes to the globes. Mild right side gaze deviation. No cervical lymphadenopathy. Aortic arch: 3 vessel arch configuration CT head ectatic thoracic aorta, 36 mm  diameter in the proximal arch and 35 mm diameter proximal descending aorta. Right carotid system: Tortuous brachiocephalic artery and proximal right CCA without stenosis. Negative right CCA. Negative right carotid bifurcation, widely patent right ICA origin. Soft plaque in the posterior right ICA bulb without stenosis. Otherwise negative cervical right ICA. Left carotid system: Calcified plaque at the left CCA origin without  stenosis. Tortuous proximal left CCA. Mild calcified plaque at the left carotid bifurcation which is widely patent. Mildly tortuous but otherwise negative cervical left ICA. Vertebral arteries: Tortuous proximal right subclavian artery with a kinked appearance (series 602, image 129). An calcified plaque, but otherwise no stenosis. Normal right vertebral artery origin. Short segment High-grade stenosis right V2 segment (series 7, image 85). At the C4 level. This does not resemble dissection, but rather atherosclerotic stenosis, or less likely related to adjacent cervical spine degeneration. Distally the cervical right vertebral artery remains patent and is without additional stenosis to the skullbase. Tortuous proximal left subclavian artery with a kinked appearance (sagittal series 603, image 120) and calcified plaque but otherwise no stenosis. Calcified plaque near the left vertebral artery origin, but no associated stenosis. Tortuous left V1 segment. Fairly codominant vertebral arteries. Tortuous left V3 segment without stenosis to the skullbase. CTA HEAD Posterior circulation: Codominant distal vertebral arteries. Normal right PICA origin. The right vertebral artery is tapered distal to the PICA, but without hemodynamically significant stenosis. No stenosis of the distal left vertebral artery. Patent vertebrobasilar junction. No basilar artery stenosis. Normal SCA and PCA origins. Posterior communicating arteries are diminutive or absent. Bilateral PCA branches are within normal limits. Anterior circulation: Both ICA siphons are patent. Mild for age bilateral siphon calcified plaque. No siphon stenosis. Ophthalmic artery origins are within normal limits. Normal carotid termini. ACA origins, anterior communicating artery, and bilateral ACA branches are within normal limits. Left MCA M1 segment, bifurcation, and left MCA branches are within normal limits. Right MCA M1 segment and bifurcation are patent. The major  M2 branches appear patent and within normal limits. There is multifocal segment irregularity and high-grade stenosis in at a middle sylvian M3 branch best seen on series 606, image 12. Otherwise distal bilateral MCA branches appear symmetric. Venous sinuses: Patent. Anatomic variants: None. IMPRESSION: 1. Positive for right MCA M3 lesions in the middle sylvian division. Negative for emergent large vessel occlusion. No more proximal anterior circulation stenosis. 2. High-grade stenosis right vertebral artery V2 segment, favor due to soft atherosclerotic plaque. Otherwise negative posterior circulation. 3. Abnormal 2 cm AP window lymphadenopathy most suspicious for malignant nodal disease. Recommend follow-up chest CT with IV contrast to evaluate further. 4. Ectatic thoracic aorta and tortuous proximal great vessels. Electronically Signed: By: Genevie Ann M.D. On: 03/02/2016 10:44   Dg Chest 2 View  03/02/2016  CLINICAL DATA:  CVA with inability to move left arm. History of hypertension, anxiety, CHF. EXAM: CHEST  2 VIEW COMPARISON:  Chest x-rays dated 01/14/2016, 12/29/2015 and 03/14/2015. FINDINGS: Cardiomegaly is stable. Overall cardiomediastinal silhouette is stable in size and configuration. The right upper mediastinal prominence is stable, most likely related to enlargement of the right thyroid (typically multinodular goiter). Left chest wall pacemaker/AICD stable in position. Again noted is central pulmonary vascular congestion and mild bilateral interstitial edema, slightly worsened in the interval. Degenerative changes are again seen within the slightly kyphotic thoracic spine. No acute- appearing osseous abnormality. IMPRESSION: Cardiomegaly with central pulmonary vascular congestion and mild bilateral interstitial edema suggesting volume overload/CHF. Suspect some degree of chronic CHF based on  multiple prior exams, perhaps slightly worsened today. Electronically Signed   By: Franki Cabot M.D.   On:  03/02/2016 20:07   Ct Head Wo Contrast  03/02/2016  CLINICAL DATA:  LEFT side weakness, RIGHT dominant gaze, history RIGHT-sided stroke, code stroke, essential hypertension, CHF, former smoker, atrial fibrillation, cardiomyopathy, renal failure EXAM: CT HEAD WITHOUT CONTRAST CT CERVICAL SPINE WITHOUT CONTRAST TECHNIQUE: Multidetector CT imaging of the head and cervical spine was performed following the standard protocol without intravenous contrast. Multiplanar CT image reconstructions of the cervical spine were also generated. COMPARISON:  None FINDINGS: CT HEAD FINDINGS Generalized atrophy. Normal ventricular morphology. No midline shift or mass effect. Small old LEFT occipital infarct. Small vessel chronic ischemic changes of deep cerebral white matter. No intracranial hemorrhage, mass lesion, evidence acute infarction, or extra-axial fluid collections. Mucosal thickening BILATERAL maxillary sinuses. Bones demineralized. CT CERVICAL SPINE FINDINGS Diffuse osseous demineralization. Prevertebral soft tissues normal thickness. Multilevel disc space narrowing and minimal endplate spur formation. Multilevel facet degenerative changes. Minimal anterolisthesis C7-T1 likely degenerative. Incomplete posterior arch C1, normal variant. Head rotation to the RIGHT with minimal rotary subluxation at C1-C2. Vertebral body heights maintained without fracture or additional subluxation. Visualized skullbase intact. Lung apices clear. IMPRESSION: Atrophy with mild small vessel chronic ischemic changes of deep cerebral white matter. 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 No acute intracranial abnormalities. Osseous demineralization with degenerative disc and facet disease changes of the cervical spine as above. No acute abnormalities. Code Stroke results called to Dr. Nicole Kindred on 03/02/2016 at 0938 hours. Electronically Signed   By: Lavonia Dana M.D.   On: 03/02/2016 09:46   Ct Angio Neck W/cm &/or Wo/cm  03/02/2016  ADDENDUM  REPORT: 03/02/2016 10:51 ADDENDUM: Study discussed by telephone with Dr. Wallie Char on 03/02/2016 at 1048 hours. Electronically Signed   By: Genevie Ann M.D.   On: 03/02/2016 10:51  03/02/2016  CLINICAL DATA:  82 year old male with left hemiplegia. Right gaze deviation. Code stroke. Initial encounter. EXAM: CT ANGIOGRAPHY HEAD AND NECK TECHNIQUE: Multidetector CT imaging of the head and neck was performed using the standard protocol during bolus administration of intravenous contrast. Multiplanar CT image reconstructions and MIPs were obtained to evaluate the vascular anatomy. Carotid stenosis measurements (when applicable) are obtained utilizing NASCET criteria, using the distal internal carotid diameter as the denominator. CONTRAST:  38mL OMNIPAQUE IOHEXOL 350 MG/ML SOLN COMPARISON:  Head and cervical Spine CT without contrast 0914 hours today. FINDINGS: CTA NECK Skeleton: Partially visible healed appearing left lateral sixth rib fracture. Scoliosis and degenerative changes in the visible spine. Osteopenia. No acute osseous abnormality identified. Left greater than right paranasal sinus opacification. Other neck: Dependent ground-glass opacity in both lungs. Apical pulmonary septal thickening. Superimposed nodular superior segment right lower lobe subpleural opacity. Enlarged aortic 0 pulmonary window soft tissue most resembling malignant lymphadenopathy measuring 24 mm short axis (series 7, image 28). Nearby precarinal lymph nodes measuring up to 10 mm short axis. Negative thyroid, larynx, pharynx, parapharyngeal spaces, retropharyngeal space, sublingual space, submandibular glands, and parotid glands. Postoperative changes to the globes. Mild right side gaze deviation. No cervical lymphadenopathy. Aortic arch: 3 vessel arch configuration CT head ectatic thoracic aorta, 36 mm diameter in the proximal arch and 35 mm diameter proximal descending aorta. Right carotid system: Tortuous brachiocephalic artery and  proximal right CCA without stenosis. Negative right CCA. Negative right carotid bifurcation, widely patent right ICA origin. Soft plaque in the posterior right ICA bulb without stenosis. Otherwise negative cervical right ICA. Left carotid system: Calcified  plaque at the left CCA origin without stenosis. Tortuous proximal left CCA. Mild calcified plaque at the left carotid bifurcation which is widely patent. Mildly tortuous but otherwise negative cervical left ICA. Vertebral arteries: Tortuous proximal right subclavian artery with a kinked appearance (series 602, image 129). An calcified plaque, but otherwise no stenosis. Normal right vertebral artery origin. Short segment High-grade stenosis right V2 segment (series 7, image 85). At the C4 level. This does not resemble dissection, but rather atherosclerotic stenosis, or less likely related to adjacent cervical spine degeneration. Distally the cervical right vertebral artery remains patent and is without additional stenosis to the skullbase. Tortuous proximal left subclavian artery with a kinked appearance (sagittal series 603, image 120) and calcified plaque but otherwise no stenosis. Calcified plaque near the left vertebral artery origin, but no associated stenosis. Tortuous left V1 segment. Fairly codominant vertebral arteries. Tortuous left V3 segment without stenosis to the skullbase. CTA HEAD Posterior circulation: Codominant distal vertebral arteries. Normal right PICA origin. The right vertebral artery is tapered distal to the PICA, but without hemodynamically significant stenosis. No stenosis of the distal left vertebral artery. Patent vertebrobasilar junction. No basilar artery stenosis. Normal SCA and PCA origins. Posterior communicating arteries are diminutive or absent. Bilateral PCA branches are within normal limits. Anterior circulation: Both ICA siphons are patent. Mild for age bilateral siphon calcified plaque. No siphon stenosis. Ophthalmic artery  origins are within normal limits. Normal carotid termini. ACA origins, anterior communicating artery, and bilateral ACA branches are within normal limits. Left MCA M1 segment, bifurcation, and left MCA branches are within normal limits. Right MCA M1 segment and bifurcation are patent. The major M2 branches appear patent and within normal limits. There is multifocal segment irregularity and high-grade stenosis in at a middle sylvian M3 branch best seen on series 606, image 12. Otherwise distal bilateral MCA branches appear symmetric. Venous sinuses: Patent. Anatomic variants: None. IMPRESSION: 1. Positive for right MCA M3 lesions in the middle sylvian division. Negative for emergent large vessel occlusion. No more proximal anterior circulation stenosis. 2. High-grade stenosis right vertebral artery V2 segment, favor due to soft atherosclerotic plaque. Otherwise negative posterior circulation. 3. Abnormal 2 cm AP window lymphadenopathy most suspicious for malignant nodal disease. Recommend follow-up chest CT with IV contrast to evaluate further. 4. Ectatic thoracic aorta and tortuous proximal great vessels. Electronically Signed: By: Genevie Ann M.D. On: 03/02/2016 10:44   Ct Chest W Contrast  03/03/2016  CLINICAL DATA:  Lymphadenopathy in the neck and upper chest, suspicious for malignancy. EXAM: CT CHEST WITH CONTRAST TECHNIQUE: Multidetector CT imaging of the chest was performed during intravenous contrast administration. CONTRAST:  80 mL Omnipaque 300 COMPARISON:  CTA neck 03/02/2016 FINDINGS: Cardiac enlargement. Coronary artery calcification. Calcification of thoracic aorta. Normal caliber thoracic aorta without aneurysm or dissection. Great vessel origins are patent. Enlarged lymph node demonstrated in the left aortopulmonic window measuring about 2.5 cm diameter. Additional scattered pretracheal and right paratracheal lymph nodes are not pathologically enlarged. No significant lymphadenopathy in the axilla.  Prominent pericardial recess. Esophagus is decompressed. Atelectasis in the lung bases. No focal parenchymal mass. Airways appear patent. No pleural effusions. No pneumothorax. Included portions of the upper abdominal organs demonstrate splenic enlargement. Cholelithiasis. Complex appearing lesion in the upper pole right kidney measuring 3.3 cm, likely a multi-septated cyst. Degenerative changes throughout the spine. No destructive bone lesions. Old left rib fractures. IMPRESSION: Nonspecific lymphadenopathy demonstrated in the AP window with lymph nodes measuring up to 2.5 cm diameter. Malignant or  reactive lymph nodes could have this appearance. Atelectasis in the lung bases. Splenic enlargement. Enlarged spleen with enlarged lymph nodes could indicate lymphoma. Complex cystic lesion in the right kidney. Cholelithiasis. Electronically Signed   By: Lucienne Capers M.D.   On: 03/03/2016 03:31   Ct Cervical Spine Wo Contrast  03/02/2016  CLINICAL DATA:  LEFT side weakness, RIGHT dominant gaze, history RIGHT-sided stroke, code stroke, essential hypertension, CHF, former smoker, atrial fibrillation, cardiomyopathy, renal failure EXAM: CT HEAD WITHOUT CONTRAST CT CERVICAL SPINE WITHOUT CONTRAST TECHNIQUE: Multidetector CT imaging of the head and cervical spine was performed following the standard protocol without intravenous contrast. Multiplanar CT image reconstructions of the cervical spine were also generated. COMPARISON:  None FINDINGS: CT HEAD FINDINGS Generalized atrophy. Normal ventricular morphology. No midline shift or mass effect. Small old LEFT occipital infarct. Small vessel chronic ischemic changes of deep cerebral white matter. No intracranial hemorrhage, mass lesion, evidence acute infarction, or extra-axial fluid collections. Mucosal thickening BILATERAL maxillary sinuses. Bones demineralized. CT CERVICAL SPINE FINDINGS Diffuse osseous demineralization. Prevertebral soft tissues normal thickness.  Multilevel disc space narrowing and minimal endplate spur formation. Multilevel facet degenerative changes. Minimal anterolisthesis C7-T1 likely degenerative. Incomplete posterior arch C1, normal variant. Head rotation to the RIGHT with minimal rotary subluxation at C1-C2. Vertebral body heights maintained without fracture or additional subluxation. Visualized skullbase intact. Lung apices clear. IMPRESSION: Atrophy with mild small vessel chronic ischemic changes of deep cerebral white matter. 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 No acute intracranial abnormalities. Osseous demineralization with degenerative disc and facet disease changes of the cervical spine as above. No acute abnormalities. Code Stroke results called to Dr. Nicole Kindred on 03/02/2016 at 0938 hours. Electronically Signed   By: Lavonia Dana M.D.   On: 03/02/2016 09:46    Assessment/Plan: Diagnosis: right brain infarct embolic Labs and images independently reviewed.  Records reviewed and summated above. Stroke: Continue secondary stroke prophylaxis and Risk Factor Modification listed below:   Antiplatelet therapy:   Blood Pressure Management:  Continue current medication with prn's with permisive HTN per primary team Statin Agent:   Left sided hemiparesis: fit for orthotics to prevent contractures (resting hand splint for day, wrist cock up splint at night, PRAFO, etc) Motor recovery: Fluoxetine  1. Does the need for close, 24 hr/day medical supervision in concert with the patient's rehab needs make it unreasonable for this patient to be served in a less intensive setting? Yes  2. Co-Morbidities requiring supervision/potential complications: HTN (monitor and provide prns in accordance with increased physical exertion and pain), chronic combined systolic and diastolic congestive heart failure (Monitor in accordance with increased physical activity and avoid UE resistance excercises), PAF (Cont meds, monitor HR with increased physical  activity), Thrombocytopenia (< 60,000/mm3 no resistive exercise), ABLA (transfuse if necessary to ensure appropriate perfusion for increased activity tolerance), BPH (cont meds) 3. Due to bladder management, safety, skin/wound care, disease management and patient education, does the patient require 24 hr/day rehab nursing? Yes 4. Does the patient require coordinated care of a physician, rehab nurse, PT (1-2 hrs/day, 5 days/week) and OT (1-2 hrs/day, 5 days/week) to address physical and functional deficits in the context of the above medical diagnosis(es)? Yes Addressing deficits in the following areas: balance, endurance, locomotion, strength, transferring, bowel/bladder control, bathing, dressing, grooming, toileting and psychosocial support 5. Can the patient actively participate in an intensive therapy program of at least 3 hrs of therapy per day at least 5 days per week? Yes 6. The potential for patient to make measurable  gains while on inpatient rehab is excellent 7. Anticipated functional outcomes upon discharge from inpatient rehab are min assist and mod assist  with PT, min assist and mod assist with OT, n/a with SLP. 8. Estimated rehab length of stay to reach the above functional goals is: 16-19 days. 9. Does the patient have adequate social supports and living environment to accommodate these discharge functional goals? No 10. Anticipated D/C setting: Other 11. Anticipated post D/C treatments: SNF 12. Overall Rehab/Functional Prognosis: good  RECOMMENDATIONS: This patient's condition is appropriate for continued rehabilitative care in the following setting: Pt needing significant assistance with therapies.  He has a son at home, who he is the caregiver for.  Given current functional status he will not likely be able to reach an independent level of functioning, much less provide support for his son.  At present, would recommend SNF after completion of medical workup. Patient has agreed to  participate in recommended program. Potentially Note that insurance prior authorization may be required for reimbursement for recommended care.  Comment: Rehab Admissions Coordinator to follow up.  Delice Lesch, MD 03/03/2016

## 2016-03-03 NOTE — Progress Notes (Signed)
Speech Language Pathology Evaluation Patient Details Name: Micheal Nicholson MRN: [id number] DOB: 01-17-1920 Today's Date: 03/03/2016 Time: 1443-1500 SLP Time Calculation (min) (ACUTE ONLY): 17 min  Problem List:  Patient Active Problem List   Diagnosis Date Noted  . Cerebral embolism with cerebral infarction 03/03/2016  . Acute blood loss anemia   . Thrombocytopenia ([place])   . Acute left hemiparesis ([place])   . Peripheral neuropathic pain ([place])   . Hemiplegia ([place]) 03/02/2016  . Left hemiparesis ([place]) 03/02/2016  . Adenopathy   . Long term current use of anticoagulant therapy 01/23/2016  . CAD (coronary artery disease) 01/23/2016  . Second degree Mobitz II AV block 01/13/2016  . Chronic systolic CHF (congestive heart failure) ([place])   . Persistent atrial fibrillation ([place])   . Atrial fibrillation with slow ventricular response ([place])   . Renal failure   . Bradycardia 12/29/2015  . Acute renal failure (ARF) ([place]) 12/29/2015  . Hyponatremia 12/29/2015  . Hypotension 12/29/2015  . Elevated troponin 12/29/2015  . Acute upper respiratory infection 12/23/2015  . Chronic combined systolic and diastolic CHF, NYHA class 2 ([place]) 04/17/2015  . Vitamin D deficiency 03/11/2015  . Cardiomyopathy -etiology uncertain [id number]  . Paroxysmal atrial fibrillation ([place]) 08/27/2014  . 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 H/O: stroke 08/27/2014  . GERD (gastroesophageal reflux disease) 02/07/2011  . BPH (benign prostatic hypertrophy) 07/12/2010  . Anxiety state 11/02/2009  . Essential hypertension 11/02/2009   Past Medical History:  Past Medical History  Diagnosis Date  . Hypertension   . Anxiety   . BPH (benign prostatic hyperplasia)   . Chronic combined systolic and diastolic CHF, NYHA class 2 ([place]) 04/17/2015  . CHF (congestive heart failure) ([place])   . Symptomatic sinus bradycardia     PPM Dr. Rayann Heman, 01/13/16, STJ device  . GERD (gastroesophageal reflux disease)   . Arthritis    Past  Surgical History:  Past Surgical History  Procedure Laterality Date  . Cardiac catheterization  2006    Negative / 4 years ago  . Ep implantable device N/A 01/13/2016    Procedure: Pacemaker Implant;  Surgeon: Thompson Grayer, MD;  Location: [place] [facility];  Service: Cardiovascular;  Laterality: N/A;   HPI:  [name] is a 82 y.o. male, [employer], clinical with hx of pafib with slow ventricular response and symptomatic bradycardia prompting placement of pacemaker on 01/13/2016, hx of htn, CHF, BPH, GERD and anxiety. Pt presents to ED today as code stroke. Pt states he awoke at 0400 feeling unsteady on his feet went back to bed and awake again at 0800 this am to use the restroom. States he was very unsteady on his feet prompting him to go sit on side of bed. Shortly thereafter, pt fell out of bed striking head. EMS reports dense left hemiplegia on their arrival to home and code stroke activated.  Pt has full recollection of event. Symptoms of left hemiplegia have improved since arrival to ED. CT head done on arrival with no acute findings, but did show old left occipital infarct.    Assessment / Plan / Recommendation Clinical Impression   Pt presents with mild cognitive deficits characterized by decreased functional problem solving, decreased emergent awareness of deficits, and decreased recall of new information which are likely exacerbated by underlying age related cognitive changes.  Pt's speech is fluent and free from dysarthria or word finding impairment.  Although pt lived with his son prior to admission, pt reported that he managed his own medications and drove.  No family present to verify baseline.  Recommend ST follow up while inpatient in order to maximize functional independence and reduce burden of care prior to discharge.      SLP Assessment  Patient needs continued Speech Lanaguage Pathology Services    Follow Up Recommendations  Home health SLP;24 hour supervision/assistance     Frequency and Duration min 1 x/week  1 week      SLP Evaluation Prior Functioning  Cognitive/Linguistic Baseline: Information not available Type of Home: House  Lives With: Family Available Help at Discharge: Family   Cognition  Overall Cognitive Status: Impaired/Different from baseline Orientation Level: Oriented X4 Attention: Selective Selective Attention: Appears intact Memory: Impaired Memory Impairment: Decreased recall of new information Awareness: Impaired Awareness Impairment: Emergent impairment Problem Solving: Impaired Problem Solving Impairment: Functional basic Safety/Judgment: Appears intact    Comprehension  Auditory Comprehension Overall Auditory Comprehension: Appears within functional limits for tasks assessed    Expression Verbal Expression Overall Verbal Expression: Appears within functional limits for tasks assessed Written Expression Dominant Hand: Right   Oral / Motor  Oral Motor/Sensory Function Overall Oral Motor/Sensory Function: Mild impairment Facial ROM: Reduced left Facial Symmetry: Within Functional Limits Facial Strength: Within Functional Limits Lingual ROM: Within Functional Limits Lingual Symmetry: Within Functional Limits Lingual Strength: Within Functional Limits Motor Speech Overall Motor Speech: Appears within functional limits for tasks assessed   GO                    PageSelinda Orion 03/03/2016, 3:05 PM

## 2016-03-03 NOTE — Progress Notes (Signed)
[facility] PROGRESS NOTE  Micheal Nicholson [id number] DOB: [birth year] DOA: 03/02/2016 PCP: [name], MD Brief narrative 82 year old male with history of paroxysmal A. fib with slow ventricular response and symptomatic bradycardia requiring pacemaker on 01/13/2016, on anticoagulation, hypertension, CHF with EF of 35 and 40%, BPH, anxiety,  brought to the ED on 03/02/2016 as a code stroke. Patient woke up on the morning feeling unsteady on his feet. Shortly he fell out of bed striking his head. As per EMS had dense left hemiplegia and code stroke was activated. When arrived to the ED is definite aphasia had been improving. TPA was not given as patient is on Eliquis. Patient denied any fever, headache, chest pain, palpitations, shortness of breath abdominal pain or bowel and bladder symptoms.  head CT done showed old left occipital infarct. Neurology was consulted and CT angiogram of the head and neck done (MRI contraindicated due to pacemaker) showed right MCA M3 lesions in the middle sylvian division. Patient admitted to hospitalist service.   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 Has underlying A. fib. CT of the head and neck shows a right MCA M3 lesion. Recent 2-D echo shows EF of 35 and 40%. Repeat echo results pending. LDL of 84. Pending A1c. Added low-dose statin. Continue Eliquis. PT/OT evaluation. Seen by speech and swallow and recommends regular diet with thin liquids. Stoke team evaluated patient and recommend that patient has high risk for further neurological worsening and recurrent stroke. Also that given his renal function is borderline, recommend to continue Eliquis. Recommend discussing with family regarding goals of care given his underlying CHF .  Pulmonary lymphadenopathy Incidental finding on CT of the neck. A chest CT with contrast done showed nonspecific lymphadenopathy measuring 2.5 cm (could do malignant versus  reactive). Also has an enlarged and enlarged lymph nodes and a complex cystic right kidney lesion. Son reports that patient was lifelong nonsmoker and never had exposure to asbestos. Recommend outpatient follow-up.  Acute confusion Possibly due to acute stroke. Patient also did not sleep overnight due to his neuropathy. Spoke with son who reports that patient has some short-term memory loss but never been notified of him having dementia.  Chronic Systolic CHF Euvolemic. Continue beta blocker. Added low-dose statin. Holding ACE inhibitor given AKI on admission. Monitor intake and output  Chronic A. fib Continue Eliquis and beta blocker. Allow permissive hypertension  Essential hypertension Continue beta blocker. Holding ACE inhibitor given mild AKI on presentation.  Peripheral neuropathy Added primipexole  Prophylaxis: On Eliquis Diet: Heart healthy  Code Status: Full code, discussed with son in detail and once aggressive care. Family Communication: Discussed with son on the phone Disposition Plan: PT recommends inpatient rehabilitation. Consulted.   Consultants:  Urology  Procedures:  CT angiogram of the head and neck  2-D echo  Antibiotics:  None  HPI/Subjective: Seen and examined. Complains of pain in his legs. Appears somewhat confused.  Objective: Filed Vitals:   03/03/16 1131 03/03/16 1315  BP: 120/68 119/75  Pulse: 62 60  Temp: 97.9 F (36.6 C) 98 F (36.7 C)  Resp: 20 18   No intake or output data in the 24 hours ending 03/03/16 1400 Filed Weights   03/02/16 1003  Weight: 79.096 kg (174 lb 6 oz)    Exam:   General: Elderly male lying in bed not in distress  HEENT: Was mucosa   chest: Clear bilaterally  CVS: S1 and S2 irregular, no murmurs rub or gallop  GI: Soft, nondistended, nontender,  bowel sounds present  Musculoskeletal: No edema  CNS: Left hemiparesis    Data Reviewed: Basic Metabolic Panel:  Recent Labs Lab 03/02/16 0906  03/02/16 0916 03/02/16 1719 03/03/16 0633  NA 139 138 138 137  K 4.4 4.3 4.7 4.2  CL 102 99* 101 101  CO2 24  --  24 26  GLUCOSE 109* 107* 116* 108*  BUN 15 19 15 16   CREATININE 1.29* 1.20 1.28* 1.22  CALCIUM 9.3  --  9.2 9.0  PHOS  --   --  4.5  --    Liver Function Tests:  Recent Labs Lab 03/02/16 0906 03/02/16 1719  AST 20  --   ALT 12*  --   ALKPHOS 54  --   BILITOT 1.2  --   PROT 6.2*  --   ALBUMIN 3.7 3.9   No results for input(s): LIPASE, AMYLASE in the last 168 hours. No results for input(s): AMMONIA in the last 168 hours. CBC:  Recent Labs Lab 03/02/16 0906 03/02/16 0916 03/03/16 0633  WBC 5.8  --  5.0  NEUTROABS 4.2  --   --   HGB 12.3* 12.9* 11.9*  HCT 35.8* 38.0* 36.4*  MCV 86.9  --  87.3  PLT 150  --  149*   Cardiac Enzymes:  Recent Labs Lab 03/02/16 1719 03/03/16 0005 03/03/16 0633 03/03/16 1050  TROPONINI <0.03 0.04* 0.04* 0.04*   BNP (last 3 results)  Recent Labs  03/14/15 1737  BNP 813.5*    ProBNP (last 3 results) No results for input(s): PROBNP in the last 8760 hours.  CBG:  Recent Labs Lab 03/02/16 0923  GLUCAP 107*    No results found for this or any previous visit (from the past 240 hour(s)).   Studies: Ct Angio Head W/cm &/or Wo Cm  03/02/2016  ADDENDUM REPORT: 03/02/2016 10:51 ADDENDUM: Study discussed by telephone with Dr. Wallie Char on 03/02/2016 at 1048 hours. Electronically Signed   By: Genevie Ann M.D.   On: 03/02/2016 10:51  03/02/2016  CLINICAL DATA:  82 year old male with left hemiplegia. Right gaze deviation. Code stroke. Initial encounter. EXAM: CT ANGIOGRAPHY HEAD AND NECK TECHNIQUE: Multidetector CT imaging of the head and neck was performed using the standard protocol during bolus administration of intravenous contrast. Multiplanar CT image reconstructions and MIPs were obtained to evaluate the vascular anatomy. Carotid stenosis measurements (when applicable) are obtained utilizing NASCET criteria, using  the distal internal carotid diameter as the denominator. CONTRAST:  57mL OMNIPAQUE IOHEXOL 350 MG/ML SOLN COMPARISON:  Head and cervical Spine CT without contrast 0914 hours today. FINDINGS: CTA NECK Skeleton: Partially visible healed appearing left lateral sixth rib fracture. Scoliosis and degenerative changes in the visible spine. Osteopenia. No acute osseous abnormality identified. Left greater than right paranasal sinus opacification. Other neck: Dependent ground-glass opacity in both lungs. Apical pulmonary septal thickening. Superimposed nodular superior segment right lower lobe subpleural opacity. Enlarged aortic 0 pulmonary window soft tissue most resembling malignant lymphadenopathy measuring 24 mm short axis (series 7, image 28). Nearby precarinal lymph nodes measuring up to 10 mm short axis. Negative thyroid, larynx, pharynx, parapharyngeal spaces, retropharyngeal space, sublingual space, submandibular glands, and parotid glands. Postoperative changes to the globes. Mild right side gaze deviation. No cervical lymphadenopathy. Aortic arch: 3 vessel arch configuration CT head ectatic thoracic aorta, 36 mm diameter in the proximal arch and 35 mm diameter proximal descending aorta. Right carotid system: Tortuous brachiocephalic artery and proximal right CCA without stenosis. Negative right CCA. Negative right carotid bifurcation,  widely patent right ICA origin. Soft plaque in the posterior right ICA bulb without stenosis. Otherwise negative cervical right ICA. Left carotid system: Calcified plaque at the left CCA origin without stenosis. Tortuous proximal left CCA. Mild calcified plaque at the left carotid bifurcation which is widely patent. Mildly tortuous but otherwise negative cervical left ICA. Vertebral arteries: Tortuous proximal right subclavian artery with a kinked appearance (series 602, image 129). An calcified plaque, but otherwise no stenosis. Normal right vertebral artery origin. Short segment  High-grade stenosis right V2 segment (series 7, image 85). At the C4 level. This does not resemble dissection, but rather atherosclerotic stenosis, or less likely related to adjacent cervical spine degeneration. Distally the cervical right vertebral artery remains patent and is without additional stenosis to the skullbase. Tortuous proximal left subclavian artery with a kinked appearance (sagittal series 603, image 120) and calcified plaque but otherwise no stenosis. Calcified plaque near the left vertebral artery origin, but no associated stenosis. Tortuous left V1 segment. Fairly codominant vertebral arteries. Tortuous left V3 segment without stenosis to the skullbase. CTA HEAD Posterior circulation: Codominant distal vertebral arteries. Normal right PICA origin. The right vertebral artery is tapered distal to the PICA, but without hemodynamically significant stenosis. No stenosis of the distal left vertebral artery. Patent vertebrobasilar junction. No basilar artery stenosis. Normal SCA and PCA origins. Posterior communicating arteries are diminutive or absent. Bilateral PCA branches are within normal limits. Anterior circulation: Both ICA siphons are patent. Mild for age bilateral siphon calcified plaque. No siphon stenosis. Ophthalmic artery origins are within normal limits. Normal carotid termini. ACA origins, anterior communicating artery, and bilateral ACA branches are within normal limits. Left MCA M1 segment, bifurcation, and left MCA branches are within normal limits. Right MCA M1 segment and bifurcation are patent. The major M2 branches appear patent and within normal limits. There is multifocal segment irregularity and high-grade stenosis in at a middle sylvian M3 branch best seen on series 606, image 12. Otherwise distal bilateral MCA branches appear symmetric. Venous sinuses: Patent. Anatomic variants: None. IMPRESSION: 1. Positive for right MCA M3 lesions in the middle sylvian division. Negative for  emergent large vessel occlusion. No more proximal anterior circulation stenosis. 2. High-grade stenosis right vertebral artery V2 segment, favor due to soft atherosclerotic plaque. Otherwise negative posterior circulation. 3. Abnormal 2 cm AP window lymphadenopathy most suspicious for malignant nodal disease. Recommend follow-up chest CT with IV contrast to evaluate further. 4. Ectatic thoracic aorta and tortuous proximal great vessels. Electronically Signed: By: Genevie Ann M.D. On: 03/02/2016 10:44   Dg Chest 2 View  03/02/2016  CLINICAL DATA:  CVA with inability to move left arm. History of hypertension, anxiety, CHF. EXAM: CHEST  2 VIEW COMPARISON:  Chest x-rays dated 01/14/2016, 12/29/2015 and 03/14/2015. FINDINGS: Cardiomegaly is stable. Overall cardiomediastinal silhouette is stable in size and configuration. The right upper mediastinal prominence is stable, most likely related to enlargement of the right thyroid (typically multinodular goiter). Left chest wall pacemaker/AICD stable in position. Again noted is central pulmonary vascular congestion and mild bilateral interstitial edema, slightly worsened in the interval. Degenerative changes are again seen within the slightly kyphotic thoracic spine. No acute- appearing osseous abnormality. IMPRESSION: Cardiomegaly with central pulmonary vascular congestion and mild bilateral interstitial edema suggesting volume overload/CHF. Suspect some degree of chronic CHF based on multiple prior exams, perhaps slightly worsened today. Electronically Signed   By: Franki Cabot M.D.   On: 03/02/2016 20:07   Ct Head Wo Contrast  03/02/2016  CLINICAL  DATA:  LEFT side weakness, RIGHT dominant gaze, history RIGHT-sided stroke, code stroke, essential hypertension, CHF, former smoker, atrial fibrillation, cardiomyopathy, renal failure EXAM: CT HEAD WITHOUT CONTRAST CT CERVICAL SPINE WITHOUT CONTRAST TECHNIQUE: Multidetector CT imaging of the head and cervical spine was performed  following the standard protocol without intravenous contrast. Multiplanar CT image reconstructions of the cervical spine were also generated. COMPARISON:  None FINDINGS: CT HEAD FINDINGS Generalized atrophy. Normal ventricular morphology. No midline shift or mass effect. Small old LEFT occipital infarct. Small vessel chronic ischemic changes of deep cerebral white matter. No intracranial hemorrhage, mass lesion, evidence acute infarction, or extra-axial fluid collections. Mucosal thickening BILATERAL maxillary sinuses. Bones demineralized. CT CERVICAL SPINE FINDINGS Diffuse osseous demineralization. Prevertebral soft tissues normal thickness. Multilevel disc space narrowing and minimal endplate spur formation. Multilevel facet degenerative changes. Minimal anterolisthesis C7-T1 likely degenerative. Incomplete posterior arch C1, normal variant. Head rotation to the RIGHT with minimal rotary subluxation at C1-C2. Vertebral body heights maintained without fracture or additional subluxation. Visualized skullbase intact. Lung apices clear. IMPRESSION: Atrophy with mild small vessel chronic ischemic changes of deep cerebral white matter. 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 No acute intracranial abnormalities. Osseous demineralization with degenerative disc and facet disease changes of the cervical spine as above. No acute abnormalities. Code Stroke results called to Dr. Nicole Kindred on 03/02/2016 at 0938 hours. Electronically Signed   By: Lavonia Dana M.D.   On: 03/02/2016 09:46   Ct Angio Neck W/cm &/or Wo/cm  03/02/2016  ADDENDUM REPORT: 03/02/2016 10:51 ADDENDUM: Study discussed by telephone with Dr. Wallie Char on 03/02/2016 at 1048 hours. Electronically Signed   By: Genevie Ann M.D.   On: 03/02/2016 10:51  03/02/2016  CLINICAL DATA:  82 year old male with left hemiplegia. Right gaze deviation. Code stroke. Initial encounter. EXAM: CT ANGIOGRAPHY HEAD AND NECK TECHNIQUE: Multidetector CT imaging of the head and neck  was performed using the standard protocol during bolus administration of intravenous contrast. Multiplanar CT image reconstructions and MIPs were obtained to evaluate the vascular anatomy. Carotid stenosis measurements (when applicable) are obtained utilizing NASCET criteria, using the distal internal carotid diameter as the denominator. CONTRAST:  45mL OMNIPAQUE IOHEXOL 350 MG/ML SOLN COMPARISON:  Head and cervical Spine CT without contrast 0914 hours today. FINDINGS: CTA NECK Skeleton: Partially visible healed appearing left lateral sixth rib fracture. Scoliosis and degenerative changes in the visible spine. Osteopenia. No acute osseous abnormality identified. Left greater than right paranasal sinus opacification. Other neck: Dependent ground-glass opacity in both lungs. Apical pulmonary septal thickening. Superimposed nodular superior segment right lower lobe subpleural opacity. Enlarged aortic 0 pulmonary window soft tissue most resembling malignant lymphadenopathy measuring 24 mm short axis (series 7, image 28). Nearby precarinal lymph nodes measuring up to 10 mm short axis. Negative thyroid, larynx, pharynx, parapharyngeal spaces, retropharyngeal space, sublingual space, submandibular glands, and parotid glands. Postoperative changes to the globes. Mild right side gaze deviation. No cervical lymphadenopathy. Aortic arch: 3 vessel arch configuration CT head ectatic thoracic aorta, 36 mm diameter in the proximal arch and 35 mm diameter proximal descending aorta. Right carotid system: Tortuous brachiocephalic artery and proximal right CCA without stenosis. Negative right CCA. Negative right carotid bifurcation, widely patent right ICA origin. Soft plaque in the posterior right ICA bulb without stenosis. Otherwise negative cervical right ICA. Left carotid system: Calcified plaque at the left CCA origin without stenosis. Tortuous proximal left CCA. Mild calcified plaque at the left carotid bifurcation which is  widely patent. Mildly tortuous but otherwise negative cervical left  ICA. Vertebral arteries: Tortuous proximal right subclavian artery with a kinked appearance (series 602, image 129). An calcified plaque, but otherwise no stenosis. Normal right vertebral artery origin. Short segment High-grade stenosis right V2 segment (series 7, image 85). At the C4 level. This does not resemble dissection, but rather atherosclerotic stenosis, or less likely related to adjacent cervical spine degeneration. Distally the cervical right vertebral artery remains patent and is without additional stenosis to the skullbase. Tortuous proximal left subclavian artery with a kinked appearance (sagittal series 603, image 120) and calcified plaque but otherwise no stenosis. Calcified plaque near the left vertebral artery origin, but no associated stenosis. Tortuous left V1 segment. Fairly codominant vertebral arteries. Tortuous left V3 segment without stenosis to the skullbase. CTA HEAD Posterior circulation: Codominant distal vertebral arteries. Normal right PICA origin. The right vertebral artery is tapered distal to the PICA, but without hemodynamically significant stenosis. No stenosis of the distal left vertebral artery. Patent vertebrobasilar junction. No basilar artery stenosis. Normal SCA and PCA origins. Posterior communicating arteries are diminutive or absent. Bilateral PCA branches are within normal limits. Anterior circulation: Both ICA siphons are patent. Mild for age bilateral siphon calcified plaque. No siphon stenosis. Ophthalmic artery origins are within normal limits. Normal carotid termini. ACA origins, anterior communicating artery, and bilateral ACA branches are within normal limits. Left MCA M1 segment, bifurcation, and left MCA branches are within normal limits. Right MCA M1 segment and bifurcation are patent. The major M2 branches appear patent and within normal limits. There is multifocal segment irregularity and  high-grade stenosis in at a middle sylvian M3 branch best seen on series 606, image 12. Otherwise distal bilateral MCA branches appear symmetric. Venous sinuses: Patent. Anatomic variants: None. IMPRESSION: 1. Positive for right MCA M3 lesions in the middle sylvian division. Negative for emergent large vessel occlusion. No more proximal anterior circulation stenosis. 2. High-grade stenosis right vertebral artery V2 segment, favor due to soft atherosclerotic plaque. Otherwise negative posterior circulation. 3. Abnormal 2 cm AP window lymphadenopathy most suspicious for malignant nodal disease. Recommend follow-up chest CT with IV contrast to evaluate further. 4. Ectatic thoracic aorta and tortuous proximal great vessels. Electronically Signed: By: Genevie Ann M.D. On: 03/02/2016 10:44   Ct Chest W Contrast  03/03/2016  CLINICAL DATA:  Lymphadenopathy in the neck and upper chest, suspicious for malignancy. EXAM: CT CHEST WITH CONTRAST TECHNIQUE: Multidetector CT imaging of the chest was performed during intravenous contrast administration. CONTRAST:  80 mL Omnipaque 300 COMPARISON:  CTA neck 03/02/2016 FINDINGS: Cardiac enlargement. Coronary artery calcification. Calcification of thoracic aorta. Normal caliber thoracic aorta without aneurysm or dissection. Great vessel origins are patent. Enlarged lymph node demonstrated in the left aortopulmonic window measuring about 2.5 cm diameter. Additional scattered pretracheal and right paratracheal lymph nodes are not pathologically enlarged. No significant lymphadenopathy in the axilla. Prominent pericardial recess. Esophagus is decompressed. Atelectasis in the lung bases. No focal parenchymal mass. Airways appear patent. No pleural effusions. No pneumothorax. Included portions of the upper abdominal organs demonstrate splenic enlargement. Cholelithiasis. Complex appearing lesion in the upper pole right kidney measuring 3.3 cm, likely a multi-septated cyst. Degenerative  changes throughout the spine. No destructive bone lesions. Old left rib fractures. IMPRESSION: Nonspecific lymphadenopathy demonstrated in the AP window with lymph nodes measuring up to 2.5 cm diameter. Malignant or reactive lymph nodes could have this appearance. Atelectasis in the lung bases. Splenic enlargement. Enlarged spleen with enlarged lymph nodes could indicate lymphoma. Complex cystic lesion in the right kidney. Cholelithiasis.  Electronically Signed   By: Lucienne Capers M.D.   On: 03/03/2016 03:31   Ct Cervical Spine Wo Contrast  03/02/2016  CLINICAL DATA:  LEFT side weakness, RIGHT dominant gaze, history RIGHT-sided stroke, code stroke, essential hypertension, CHF, former smoker, atrial fibrillation, cardiomyopathy, renal failure EXAM: CT HEAD WITHOUT CONTRAST CT CERVICAL SPINE WITHOUT CONTRAST TECHNIQUE: Multidetector CT imaging of the head and cervical spine was performed following the standard protocol without intravenous contrast. Multiplanar CT image reconstructions of the cervical spine were also generated. COMPARISON:  None FINDINGS: CT HEAD FINDINGS Generalized atrophy. Normal ventricular morphology. No midline shift or mass effect. Small old LEFT occipital infarct. Small vessel chronic ischemic changes of deep cerebral white matter. No intracranial hemorrhage, mass lesion, evidence acute infarction, or extra-axial fluid collections. Mucosal thickening BILATERAL maxillary sinuses. Bones demineralized. CT CERVICAL SPINE FINDINGS Diffuse osseous demineralization. Prevertebral soft tissues normal thickness. Multilevel disc space narrowing and minimal endplate spur formation. Multilevel facet degenerative changes. Minimal anterolisthesis C7-T1 likely degenerative. Incomplete posterior arch C1, normal variant. Head rotation to the RIGHT with minimal rotary subluxation at C1-C2. Vertebral body heights maintained without fracture or additional subluxation. Visualized skullbase intact. Lung apices  clear. IMPRESSION: Atrophy with mild small vessel chronic ischemic changes of deep cerebral white matter. 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 No acute intracranial abnormalities. Osseous demineralization with degenerative disc and facet disease changes of the cervical spine as above. No acute abnormalities. Code Stroke results called to Dr. Nicole Kindred on 03/02/2016 at 0938 hours. Electronically Signed   By: Lavonia Dana M.D.   On: 03/02/2016 09:46    Scheduled Meds: . sodium chloride   Intravenous STAT  . apixaban  5 mg Oral BID  . atorvastatin  5 mg Oral q1800  . bacitracin-polymyxin b   Both Eyes BID  . carvedilol  3.125 mg Oral BID WC  . dutasteride  0.5 mg Oral Daily   And  . tamsulosin  0.4 mg Oral Daily  . fluticasone  2 spray Each Nare BID  . guaiFENesin  600 mg Oral BID  . pantoprazole  40 mg Oral Daily  . pramipexole  0.25 mg Oral QHS  . sertraline  150 mg Oral Daily  . sodium chloride flush  3 mL Intravenous Q12H  . [START ON 03/08/2016] Vitamin D (Ergocalciferol)  50,000 Units Oral Q Wed   Continuous Infusions: . sodium chloride 75 mL/hr at 03/02/16 1815      Time spent: 25 minutes    Ashana Tullo, [place]  [facility] Pager [phone]. If 7PM-7AM, please contact night-coverage at [email or web address], password [facility] 03/03/2016, 2:00 PM  LOS: 1 day

## 2016-03-03 NOTE — Progress Notes (Signed)
I will follow up with pt and family to discuss overall goals and caregiver support at home which pt now will need. [employer] would have to approve any rehab venue. [id number]

## 2016-03-03 NOTE — Progress Notes (Signed)
OT Cancellation Note  Patient Details Name: Micheal Nicholson MRN: [id number] DOB: [birth year]   Cancelled Treatment:    Reason Eval/Treat Not Completed: Other (comment) (2nd attempt at OT eval today; currently pt with SLP). Will follow up for OT eval as time allows.  Binnie Kand M.S., OTR/L Pager: [phone]  03/03/2016, 2:51 PM

## 2016-03-03 NOTE — Progress Notes (Signed)
Rehab Admissions Coordinator Note:  Patient was screened by Cleatrice Burke for appropriateness for an Inpatient Acute Rehab Consult per PT recommendation.   At this time, we are recommending Inpatient Rehab consult. I will discuss with Dr. Clementeen Graham.  Cleatrice Burke 03/03/2016, 12:43 PM  I can be reached at [phone].

## 2016-03-03 NOTE — Progress Notes (Signed)
STROKE TEAM PROGRESS NOTE   HISTORY OF PRESENT ILLNESS Micheal Nicholson is an 82 y.o. male with a history of atrial fibrillation on Eliquis, hypertension and CHF brought to the emergency room and code stroke status following onset of left hemiplegia. Patient indicated that he felt weakness and unsteady gait at 4 AM. Onset of left hemiplegia was at 8 AM. He was last known well at bedtime at 10 PM on 03/01/2016. CT scan of his head showed no acute intracranial abnormality. CT angiogram showed no large vessel occlusion. A high-grade right M3 stenosis was seen, however. High-grade stenosis of right vertebral V2 segment was noted. Abnormal 2 cm AP window lymphadenopathy, suspicious for malignancy was noted and follow-up CT of the chest with contrast is recommended. Patient's deficits improved fairly rapidly. NIH stroke score at the time of this evaluation was 7. At the end of this evaluation he had residual primarily proximal left upper extremity weakness as his most outstanding deficit. Patient was not administered IV t-PA secondary to on anticoagulation with Eliquis; rapidly improving deficits. He was admitted for further evaluation and treatment.   SUBJECTIVE (INTERVAL HISTORY) No family is at the bedside.  Overall he feels his condition is stable. He complains of lower extremity pain, he thinks is nerve related, worse at night. New medication helps a little, but "doc, you gotta do something about this pain!"   OBJECTIVE Temp:  [97.5 F (36.4 C)-98.7 F (37.1 C)] 97.7 F (36.5 C) (03/10 0200) Pulse Rate:  [56-71] 66 (03/10 0200) Cardiac Rhythm:  [-] Ventricular paced (03/09 1900) Resp:  [16-24] 20 (03/10 0200) BP: (121-159)/(62-107) 123/62 mmHg (03/10 0200) SpO2:  [94 %-99 %] 94 % (03/10 0200) Weight:  [79.096 kg (174 lb 6 oz)] 79.096 kg (174 lb 6 oz) (03/09 1003)  CBC:  Recent Labs Lab 03/02/16 0906 03/02/16 0916 03/03/16 0633  WBC 5.8  --  5.0  NEUTROABS 4.2  --   --   HGB 12.3*  12.9* 11.9*  HCT 35.8* 38.0* 36.4*  MCV 86.9  --  87.3  PLT 150  --  149*    Basic Metabolic Panel:  Recent Labs Lab 03/02/16 1719 03/03/16 0633  NA 138 137  K 4.7 4.2  CL 101 101  CO2 24 26  GLUCOSE 116* 108*  BUN 15 16  CREATININE 1.28* 1.22  CALCIUM 9.2 9.0  PHOS 4.5  --     Lipid Panel:    Component Value Date/Time   CHOL 125 03/03/2016 0633   TRIG 59 03/03/2016 0633   HDL 29* 03/03/2016 0633   CHOLHDL 4.3 03/03/2016 0633   VLDL 12 03/03/2016 0633   LDLCALC 84 03/03/2016 0633   HgbA1c: No results found for: HGBA1C Urine Drug Screen:    Component Value Date/Time   LABOPIA NONE DETECTED 03/02/2016 1137   COCAINSCRNUR NONE DETECTED 03/02/2016 1137   LABBENZ POSITIVE* 03/02/2016 1137   AMPHETMU NONE DETECTED 03/02/2016 1137   THCU NONE DETECTED 03/02/2016 1137   LABBARB NONE DETECTED 03/02/2016 1137      IMAGING  Dg Chest 2 View 03/02/2016  Cardiomegaly with central pulmonary vascular congestion and mild bilateral interstitial edema suggesting volume overload/CHF. Suspect some degree of chronic CHF based on multiple prior exams, perhaps slightly worsened today.   Ct Angio Head & Neck W/cm &/or Wo/cm 03/02/2016  1. Positive for right MCA M3 lesions in the middle sylvian division. Negative for emergent large vessel occlusion. No more proximal anterior circulation stenosis. 2. High-grade stenosis right vertebral artery  V2 segment, favor due to soft atherosclerotic plaque. Otherwise negative posterior circulation. 3. Abnormal 2 cm AP window lymphadenopathy most suspicious for malignant nodal disease. Recommend follow-up chest CT with IV contrast to evaluate further. 4. Ectatic thoracic aorta and tortuous proximal great vessels.   Ct Chest W Contrast 03/03/2016   Nonspecific lymphadenopathy demonstrated in the AP window with lymph nodes measuring up to 2.5 cm diameter. Malignant or reactive lymph nodes could have this appearance. Atelectasis in the lung bases. Splenic  enlargement. Enlarged spleen with enlarged lymph nodes could indicate lymphoma. Complex cystic lesion in the right kidney. Cholelithiasis.   Ct head & Cervical Spine Wo Contrast 03/02/2016  Atrophy with mild small vessel chronic ischemic changes of deep cerebral white matter. 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 No acute intracranial abnormalities. Osseous demineralization with degenerative disc and facet disease changes of the cervical spine as above. No acute abnormalities.    PHYSICAL EXAM Frail elderly male not in distress. . Afebrile. Head is nontraumatic. Neck is supple without bruit.    Cardiac exam no murmur or gallop. Lungs are clear to auscultation. Distal pulses are well felt. Neurological Exam ;  Awake alert oriented 3 normal speech and language. Minimum dysarthria and sclerosis a few words. No gaze preference and extraocular movements are full range. Blinks to threat bilaterally. Mild left lower facial weakness. Tongue midline. Left hemiplegia with 2/3 left upper extremity strength and 3/5 left lower extremity strength. Normal strength on the right and tone. Deep tendon reflexes are depressed on the left and normal on the right. Left plantar upgoing right downgoing. Diminished left hemibody sensation compared to the right. Gait was not tested. ASSESSMENT/PLAN Micheal Nicholson is a 82 y.o. male with history of atrial fibrillation on Eliquis, hypertension and CHF presenting with left hemiplegia. He did not receive IV t-PA due to on anticoagulation with Eliquis; rapidly improving deficits.   Stroke:  Non-dominant right brain infarct embolic secondary to known atrial fibrillation  Resultant  Left hemiparesis  MRI  / MRA  Pacer  CT angio head & neck right MCA M3 lesion, no emergent large vessel occlusion. High-grade stenosis R V2. Abnormal lymph adenopathy suspicious for malignant nodal disease.   CT head and cervical spine white matter disease. Old left PCA infarct. No  acute abnormalities.  CT chest nonspecific lymphadenopathy measuring up to 2.5 cm. Malignant or reactive lymph nodes. Atelectasis and lung base. Splenic enlargement with enlarged lymph nodes could indicate lymphoma. Complex cystic lesion right kidney.  2D Echo  Done in January with EF 35-40%. No source of embolus. Repeat echo pending   Repeat CT of head to confirm stroke  LDL 84  HgbA1c pending  Eliquis for VTE prophylaxis  Diet Heart Room service appropriate?: Yes; Fluid consistency:: Thin  Eliquis (apixaban) daily prior to admission, now on Eliquis (apixaban) daily  Patient counseled to be compliant with his antithrombotic medications  Ongoing aggressive stroke risk factor management  Therapy recommendations:  pending  Disposition:  pending  Atrial Fibrillation  Home anticoagulation:  Eliquis (apixaban) daily continued in the hospital  Continue at discharge    Hypertension  Stable  Hyperlipidemia  Home meds:  No statin  LDL 84, goal < 70  Add low dose statin  Continue statin at discharge  Other Stroke Risk Factors  Advanced age  Former Cigarette smoker  L leg neuropathy/RLS  Increase mirapex to 0.25 mg daily  Other Active Problems  Chronic combined systolic and diastolic CHF, NYHA class 2  Symptomatic sinus  bradycardia, status post [employer] pacemaker placed 01/13/2016 by Dr. Rayann Heman  Lymphadenopathy, in chest and spleen  Mild anemia  GERD  BPH  Mild renal insufficiency  Hospital day # [place] for Pager information 03/03/2016 11:35 AM  I have personally examined this patient, reviewed notes, independently viewed imaging studies, participated in medical decision making and plan of care. I have made any additions or clarifications directly to the above note. Agree with note above. He presented with left hemiplegia secondary to embolic right MCA branch infarct secondary to atrial fibrillation despite  anticoagulation with eliquis. He remains at risk for neurological worsening, recurrent stroke, TIA, brain hemorrhage and needs ongoing close neurological monitoring and stroke workup. I also had a long discussion with the patient with regards to available treatment options for stroke prevention and atrial fibrillation and lack of any comparative data amongst the several new anticoagulants. Given his advanced age and borderline renal function eliquis would be the only choice and hence I would not recommend changing and the present time. He has lower extremity pain and paresthesias likely due to restless leg syndrome and we will increase Mirapex dose. If not effective may consider adding gabapentin  Antony Contras, MD Medical Director [facility] Pager: [phone] 03/03/2016 11:48 AM    To contact Stroke Continuity provider, please refer to [email or web address] After hours, contact General Neurology

## 2016-03-03 NOTE — Care Management Note (Signed)
Case Management Note  Patient Details  Name: Anjan Etcheverry MRN: [id number] Date of Birth: 1920-02-11  Subjective/Objective:                    Action/Plan: Patient presented with left hemiplegia. Lives at home with son. Will follow for discharge needs pending PT/OT evals and physician orders.  Expected Discharge Date:   (Pending)               Expected Discharge Plan:     In-House Referral:     Discharge planning Services     Post Acute Care Choice:    Choice offered to:     DME Arranged:    DME Agency:     HH Arranged:    HH Agency:     Status of Service:  In process, will continue to follow  Medicare Important Message Given:    Date Medicare IM Given:    Medicare IM give by:    Date Additional Medicare IM Given:    Additional Medicare Important Message give by:     If discussed at [place] of Stay Meetings, dates discussed:    Additional Comments:  Rolm Baptise, RN 03/03/2016, 10:58 AM [phone]

## 2016-03-03 NOTE — Evaluation (Signed)
Physical Therapy Evaluation Patient Details Name: Micheal Nicholson MRN: [id number] DOB: [birth year] Today's Date: 03/03/2016   History of Present Illness  82 y.o. male with a history of atrial fibrillation on Eliquis, hypertension and CHF brought to the emergency room and code stroke status following onset of left hemiplegia. Patient indicated that he felt weakness and unsteady gait at 4 AM. Onset of left hemiplegia was at 8 AM. He was last known well at bedtime at 10 PM on 03/01/2016. CT scan of his head showed no acute intracranial abnormality. CT angiogram showed no large vessel occlusion. A high-grade right M3 stenosis was seen, however. High-grade stenosis of right vertebral V2 segment was noted. Abnormal 2 cm AP window lymphadenopathy, suspicious for malignancy was noted and follow-up CT of the chest with contrast is recommended  Clinical Impression  Pt admitted with above diagnosis. Pt currently with functional limitations due to the deficits listed below (see PT Problem List). 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 Pt will benefit from skilled PT to increase their independence and safety with mobility to allow discharge to the venue listed below. Recommending intensive rehab to maximize independence to prepare for discharge home.      Follow Up Recommendations CIR;Supervision/Assistance - 24 hour    Equipment Recommendations  Wheelchair (measurements PT);Wheelchair cushion (measurements PT) (Per chart, pt has RW but did not use PTA)    Recommendations for Other Services Rehab consult     Precautions / Restrictions Precautions Precautions: Fall Precaution Comments: L hemiparesis Restrictions Weight Bearing Restrictions: No      Mobility  Bed Mobility Overal bed mobility: Needs Assistance Bed Mobility: Rolling;Supine to Sit Rolling: Mod assist   Supine to sit: Max assist;HOB elevated     General bed mobility comments: Cues for technique and required  assist for LLE and trunk elevation  Transfers Overall transfer level: Needs assistance Equipment used: 2 person hand held assist Transfers: Stand Pivot Transfers   Stand pivot transfers: +2 physical assistance;From elevated surface       General transfer comment: 3 attempted for sit <> stand from elevated EOB with total A but pt unable to get into full upright standing. Required 2 person assist for final sit to stand and pivot into recliner. Unable to advance LLE during transfer.  Ambulation/Gait             General Gait Details: unsafe to attempt at this time  Stairs            Wheelchair Mobility    Modified Rankin (Stroke Patients Only)       Balance Overall balance assessment: Needs assistance Sitting-balance support: Bilateral upper extremity supported;Feet supported Sitting balance-Leahy Scale: Poor Sitting balance - Comments: required min to max assist for balance sitting EOB due to L lateral lean Postural control: Left lateral lean   Standing balance-Leahy Scale: Zero Standing balance comment: +2 assist needed at this time                             Pertinent Vitals/Pain Pain Assessment: 0-10 Pain Score: 5  Pain Location: LE's but reports it is much better than before Pain Intervention(s): Monitored during session;Repositioned    Home Living Family/patient expects to be discharged to:: Private residence Living Arrangements: Children Available Help at Discharge: Family Type of Home: House Home Access: Stairs to enter Entrance Stairs-Rails:  (unable to recall which side but states there is a handrail) Entrance  Stairs-Number of Steps: 2 Home Layout: One level   Additional Comments: Pt lives with son whom is disabled and he is a caregiver for. Pt reports his other son can help as needed but not sure if he would be able to stay with pt 24/7 at this time    Prior Function Level of Independence: Independent         Comments: Pt  reports being very active PTA, still driving and taking care of himself. He reports that he works out and [employer] daily     Hand Dominance   Dominant Hand: Right    Extremity/Trunk Assessment   Upper Extremity Assessment: Defer to OT evaluation           Lower Extremity Assessment: LLE deficits/detail   LLE Deficits / Details: decreased strength functionally; able to initiate movement in the bed and during transfe  Cervical / Trunk Assessment: Kyphotic  Communication   Communication: HOH  Cognition Arousal/Alertness: Awake/alert Behavior During Therapy: WFL for tasks assessed/performed Overall Cognitive Status: Impaired/Different from baseline Area of Impairment: Memory     Memory: Decreased short-term memory         General Comments: Pt reports having a hard time remembering things day to day since admission. Overall able to follow all commands and generally aware of situation and impairments. He did need to be reminded that he likely will need further therapy before returning home which we discussed towards the beginning of the session.     General Comments General comments (skin integrity, edema, etc.): education on positioning of LUE     Exercises        Assessment/Plan    PT Assessment Patient needs continued PT services  PT Diagnosis Difficulty walking;Generalized weakness;Acute pain;Hemiplegia non-dominant side;Altered mental status   PT Problem List Decreased strength;Decreased range of motion;Decreased activity tolerance;Decreased balance;Decreased coordination;Decreased mobility;Decreased cognition;Decreased knowledge of use of DME;Cardiopulmonary status limiting activity;Impaired tone;Decreased skin integrity;Pain  PT Treatment Interventions DME instruction;Gait training;Stair training;Functional mobility training;Therapeutic activities;Therapeutic exercise;Balance training;Neuromuscular re-education;Cognitive remediation;Patient/family  education;Wheelchair mobility training;Modalities   PT Goals (Current goals can be found in the Care Plan section) Acute Rehab PT Goals Patient Stated Goal: sit up PT Goal Formulation: With patient Time For Goal Achievement: 03/17/16 Potential to Achieve Goals: Good    Frequency Min 4X/week   Barriers to discharge Decreased caregiver support;Inaccessible home environment 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 No family present at evaluation.    Co-evaluation               End of Session Equipment Utilized During Treatment: Oxygen Activity Tolerance: Patient limited by fatigue;Patient tolerated treatment well Patient left: in chair;with call bell/phone within reach Nurse Communication: Mobility status;Need for lift equipment         Time: [id number] PT Time Calculation (min) (ACUTE ONLY): 28 min   Charges:   PT Evaluation $PT Eval Moderate Complexity: 1 Procedure PT Treatments $Neuromuscular Re-education: 8-22 mins   PT G Codes:        Juanna Cao, PT, DPT  03/03/2016, 11:39 AM

## 2016-03-03 NOTE — Evaluation (Signed)
Clinical/Bedside Swallow Evaluation Patient Details  Name: Micheal Nicholson MRN: [id number] Date of Birth: [birth year]  Today's Date: 03/03/2016 Time: SLP Start Time (ACUTE ONLY): 1200 SLP Stop Time (ACUTE ONLY): 1213 SLP Time Calculation (min) (ACUTE ONLY): 13 min  Past Medical History:  Past Medical History  Diagnosis Date  . Hypertension   . Anxiety   . BPH (benign prostatic hyperplasia)   . Chronic combined systolic and diastolic CHF, NYHA class 2 ([place]) 04/17/2015  . CHF (congestive heart failure) ([place])   . Symptomatic sinus bradycardia     PPM Dr. Rayann Heman, 01/13/16, STJ device  . GERD (gastroesophageal reflux disease)   . Arthritis    Past Surgical History:  Past Surgical History  Procedure Laterality Date  . Cardiac catheterization  2006    Negative / 4 years ago  . 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 Pt presents to ED today as code stroke. Pt states he awoke at 0400 feeling unsteady on his feet went back to bed and awake again at 0800 this am to use the restroom. States he was very unsteady on his feet prompting him to go sit on side of bed. Shortly thereafter, pt fell out of bed striking head. EMS reports dense left hemiplegia on their arrival to home and code stroke activated.  Pt has full recollection of event. Symptoms of left hemiplegia have improved since arrival to ED. CT head done on arrival with no acute findings, but did show old left occipital infarct.    Assessment / Plan / Recommendation Clinical Impression   Bedside swallow evaluation ordered after RN reported coughing with liquids after passing stroke swallow screen.   Pt presents with mild left sided facial weakness which did not appear to have a significant impact in oral phase of swallow as pt was able to masticate solids and clear residuals from the oral cavity post swallow without difficulty.  No overt s/s of aspiration were evident with solids or liquids despite being challenged with large consecutive boluses of thins via straw and incorporating mixed consistencies.  Recommend that pt remain on regular diet with thin liquids.  No ST follow up indicated for dysphagia at this time.  Orders also received for cognitive evaluation which SLP will complete at earliest available appointment.       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 Pt presents to ED today as code stroke. Pt states he awoke at 0400 feeling unsteady on  his feet went back to bed and awake again at 0800 this am to use the restroom. States he was very unsteady on his feet prompting him to go sit on side of bed. Shortly thereafter, pt fell out of bed striking head. EMS reports dense left hemiplegia on their arrival to home and code stroke activated.  Pt has full recollection of event. Symptoms of left hemiplegia have improved since arrival to ED. CT head done on arrival with no acute findings, but  did show old left occipital infarct.  Type of Study: Bedside Swallow Evaluation Previous Swallow Assessment: none on record Diet Prior to this Study: Regular;Thin liquids Temperature Spikes Noted: No Respiratory Status: Nasal cannula History of Recent Intubation: No Behavior/Cognition: Alert;Cooperative;Distractible Oral Cavity Assessment: Within Functional Limits Oral Care Completed by SLP: No Oral Cavity - Dentition: Adequate natural dentition Vision: Functional for self-feeding Self-Feeding Abilities: Able to feed self Patient Positioning: Upright in chair Baseline Vocal Quality: Normal Volitional Cough: Strong Volitional Swallow: Able to elicit    Oral/Motor/Sensory Function Overall Oral Motor/Sensory Function: Mild impairment Facial ROM: Reduced left Facial Symmetry: Within Functional Limits Facial Strength: Within Functional Limits Lingual ROM: Within Functional Limits Lingual Symmetry: Within Functional Limits Lingual Strength: Within Functional Limits   Ice Chips     Thin Liquid Thin Liquid: Within functional limits Presentation: Cup;Straw    Nectar Thick     Honey Thick     Puree Puree: Within functional limits Presentation: Self Fed;Spoon   Solid   GO   Solid: Within functional limits Presentation: Self Fed        Sharena Dibenedetto, Elmyra Ricks L 03/03/2016,12:21 PM

## 2016-03-03 NOTE — Progress Notes (Signed)
Pt returned to unit from CT. Pt c/o 10/10 pain in BLE. Pt repositioned to left side. NS infusing into Pt LH resumed to 28mL/h. Call light placed within Pt reach. RN will continue to monitor.

## 2016-03-03 NOTE — Progress Notes (Signed)
  Echocardiogram 2D Echocardiogram has been performed.  Micheal Nicholson 03/03/2016, 12:06 PM

## 2016-03-04 DIAGNOSIS — I63411 Cerebral infarction due to embolism of right middle cerebral artery: Secondary | ICD-10-CM

## 2016-03-04 DIAGNOSIS — G629 Polyneuropathy, unspecified: Secondary | ICD-10-CM

## 2016-03-04 DIAGNOSIS — I255 Ischemic cardiomyopathy: Secondary | ICD-10-CM | POA: Insufficient documentation

## 2016-03-04 LAB — BASIC METABOLIC PANEL
Anion gap: 9 (ref 5–15)
BUN: 21 mg/dL — ABNORMAL HIGH (ref 6–20)
CO2: 25 mmol/L (ref 22–32)
Calcium: 9.1 mg/dL (ref 8.9–10.3)
Chloride: 104 mmol/L (ref 101–111)
Creatinine, Ser: 1.21 mg/dL (ref 0.61–1.24)
GFR calc Af Amer: 58 mL/min — ABNORMAL LOW (ref >60)
GFR calc non Af Amer: 50 mL/min — ABNORMAL LOW (ref >60)
Glucose, Bld: 107 mg/dL — ABNORMAL HIGH (ref 65–99)
Potassium: 4.2 mmol/L (ref 3.5–5.1)
Sodium: 138 mmol/L (ref 135–145)

## 2016-03-04 LAB — TROPONIN I
Troponin I: 0.03 ng/mL (ref <0.031)
Troponin I: 0.03 ng/mL (ref <0.031)
Troponin I: 0.04 ng/mL — ABNORMAL HIGH (ref <0.031)
Troponin I: 0.04 ng/mL — ABNORMAL HIGH (ref <0.031)
Troponin I: 0.04 ng/mL — ABNORMAL HIGH (ref <0.031)

## 2016-03-04 LAB — HEMOGLOBIN A1C
HEMOGLOBIN A1C: 5.7 % — AB (ref 4.8–5.6)
MEAN PLASMA GLUCOSE: 117 mg/dL

## 2016-03-04 MED ORDER — GABAPENTIN 100 MG PO CAPS
200.0000 mg | ORAL_CAPSULE | Freq: Three times a day (TID) | ORAL | Status: DC
  Administered 2016-03-04 – 2016-03-06 (×7): 200 mg via ORAL
  Filled 2016-03-04 (×8): qty 2

## 2016-03-04 MED ORDER — ALPRAZOLAM 0.5 MG PO TABS
0.5000 mg | ORAL_TABLET | Freq: Two times a day (BID) | ORAL | Status: DC | PRN
  Administered 2016-03-04 – 2016-03-06 (×5): 0.5 mg via ORAL
  Filled 2016-03-04 (×5): qty 1

## 2016-03-04 NOTE — Progress Notes (Signed)
[facility] PROGRESS NOTE  Micheal Nicholson [id number] DOB: 1920/11/17 DOA: 03/02/2016 PCP: [name], MD Brief narrative 82 year old male with history of paroxysmal A. fib with slow ventricular response and symptomatic bradycardia requiring pacemaker on 01/13/2016, on anticoagulation, hypertension, CHF with EF of 35 and 40%, BPH, anxiety,  brought to the ED on 03/02/2016 as a code stroke. Patient woke up on the morning feeling unsteady on his feet. Shortly he fell out of bed striking his head. As per EMS had dense left hemiplegia and code stroke was activated. When arrived to the ED is definite aphasia had been improving. TPA was not given as patient is on Eliquis. Patient denied any fever, headache, chest pain, palpitations, shortness of breath abdominal pain or bowel and bladder symptoms.  head CT done showed old left occipital infarct. Neurology was consulted and CT angiogram of the head and neck done (MRI contraindicated due to pacemaker) showed right MCA M3 lesions in the middle sylvian division. Patient admitted to hospitalist service.   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 Has underlying A. fib. CT of the head and neck shows a right MCA M3 lesion. Recent 2-D echo shows EF of 35 and 40%. Repeat echo results pending. LDL of 84. Pending A1c. Added low-dose statin. -CIR consulted. Seen by speech and swallow and recommends regular diet with thin liquids. -Appreciate stroke team recommendations. Patient has recurrent stroke while on anticoagulation. They discussed with patient's son and recommended switching to either low-dose aspirin and Plavix or different anticoagulation. Son wanted to switch patient to Coumadin. Plan is to start Coumadin with goal INR of 2.5-2.5. Will place with subcutaneous Lovenox until INR therapeutic.  Pulmonary lymphadenopathy Incidental finding on CT of the neck. A chest CT with contrast done showed nonspecific  lymphadenopathy measuring 2.5 cm (could do malignant versus reactive). Also has an enlarged and enlarged lymph nodes and a complex cystic right kidney lesion. Son reports that patient was lifelong nonsmoker and never had exposure to asbestos. Recommend outpatient follow-up.  Acute confusion Possibly due to acute stroke. Patient also did not sleep overnight due to his neuropathy. Appears at baseline today.  Chronic Systolic CHF Euvolemic. Continue beta blocker. Added low-dose statin. Held ACE inhibitor given AKI on admission. Monitor intake and output  Chronic A. fib Continue beta blocker. Allow permissive hypertension. Anticoagulation switch to Coumadin with target INR of 2.5-3.5. Bridging with subcutaneous Lovenox.  Essential hypertension Continue beta blocker. Holding ACE inhibitor given mild AKI on presentation.  Peripheral neuropathy Added primipexole and Neurontin. Will adjust Neurontin dose.  Anxiety Added Xanax.   DVT prophylaxis: Coumadin , bridging with Lovenox  Diet: Heart healthy  Code Status: Full code Family Communication: None at bedside Disposition Plan: Pending CIR approval   Consultants:  Neurology  Procedures:  CT angiogram of the head and neck  2-D echo  Antibiotics:  None  HPI/Subjective: Seen and examined. Complains of sharp shooting pain in his lower extremities.  Objective: Filed Vitals:   03/04/16 0519 03/04/16 1104  BP: 142/82 128/70  Pulse: 61 62  Temp:  98.2 F (36.8 C)  Resp: 18 18    Intake/Output Summary (Last 24 hours) at 03/04/16 1259 Last data filed at 03/03/16 2141  Gross per 24 hour  Intake      0 ml  Output    400 ml  Net   -400 ml   Filed Weights   03/02/16 1003 03/04/16 0623  Weight: 79.096 kg (174 lb 6 oz) 94.303 kg (207 lb  14.4 oz)    Exam:   General: Elderly male lying in bed not in distress  HEENT: Moist mucosa   chest: Clear bilaterally  CVS: S1 and S2 irregular, no murmurs rub or gallop  GI:  Soft, nondistended, nontender, bowel sounds present  Musculoskeletal: No edema  CNS: left UE monoplegia, 3+/5 power in LLE    Data Reviewed: Basic Metabolic Panel:  Recent Labs Lab 03/02/16 0906 03/02/16 0916 03/02/16 1719 03/03/16 0633 03/04/16 0534  NA 139 138 138 137 138  K 4.4 4.3 4.7 4.2 4.2  CL 102 99* 101 101 104  CO2 24  --  24 26 25   GLUCOSE 109* 107* 116* 108* 107*  BUN 15 19 15 16  21*  CREATININE 1.29* 1.20 1.28* 1.22 1.21  CALCIUM 9.3  --  9.2 9.0 9.1  PHOS  --   --  4.5  --   --    Liver Function Tests:  Recent Labs Lab 03/02/16 0906 03/02/16 1719  AST 20  --   ALT 12*  --   ALKPHOS 54  --   BILITOT 1.2  --   PROT 6.2*  --   ALBUMIN 3.7 3.9   No results for input(s): LIPASE, AMYLASE in the last 168 hours. No results for input(s): AMMONIA in the last 168 hours. CBC:  Recent Labs Lab 03/02/16 0906 03/02/16 0916 03/03/16 0633  WBC 5.8  --  5.0  NEUTROABS 4.2  --   --   HGB 12.3* 12.9* 11.9*  HCT 35.8* 38.0* 36.4*  MCV 86.9  --  87.3  PLT 150  --  149*   Cardiac Enzymes:  Recent Labs Lab 03/03/16 1050 03/03/16 1623 03/03/16 2357 03/04/16 0534 03/04/16 1050  TROPONINI 0.04* 0.03 0.03 0.03 0.04*   BNP (last 3 results)  Recent Labs  03/14/15 1737  BNP 813.5*    ProBNP (last 3 results) No results for input(s): PROBNP in the last 8760 hours.  CBG:  Recent Labs Lab 03/02/16 0923  GLUCAP 107*    No results found for this or any previous visit (from the past 240 hour(s)).   Studies: Dg Chest 2 View  03/02/2016  CLINICAL DATA:  CVA with inability to move left arm. History of hypertension, anxiety, CHF. EXAM: CHEST  2 VIEW COMPARISON:  Chest x-rays dated 01/14/2016, 12/29/2015 and 03/14/2015. FINDINGS: Cardiomegaly is stable. Overall cardiomediastinal silhouette is stable in size and configuration. The right upper mediastinal prominence is stable, most likely related to enlargement of the right thyroid (typically multinodular  goiter). Left chest wall pacemaker/AICD stable in position. Again noted is central pulmonary vascular congestion and mild bilateral interstitial edema, slightly worsened in the interval. Degenerative changes are again seen within the slightly kyphotic thoracic spine. No acute- appearing osseous abnormality. IMPRESSION: Cardiomegaly with central pulmonary vascular congestion and mild bilateral interstitial edema suggesting volume overload/CHF. Suspect some degree of chronic CHF based on multiple prior exams, perhaps slightly worsened today. Electronically Signed   By: Franki Cabot M.D.   On: 03/02/2016 20:07   Ct Head Wo Contrast  03/03/2016  CLINICAL DATA:  82 year old male with left side neuro deficits. Right M3 lesion suspected on CTA head and neck yesterday. Initial encounter. EXAM: CT HEAD WITHOUT CONTRAST TECHNIQUE: Contiguous axial images were obtained from the base of the skull through the vertex without intravenous contrast. COMPARISON:  Head CT without contrast 03/02/2016. FINDINGS: Continued bubbly opacity in the left sphenoid sinus. Other paranasal sinuses and mastoids are stable. No acute osseous abnormality identified.  Rightward gaze. Stable orbit and scalp soft tissues. No definite evolving cortically based infarct is identified. There is suggestion of increased right peri-Rolandic subcortical white matter hypodensity on series 201, image 24. More anterior pre motor area subcortical hypodensity on image 23 appears stable. There does appear to be a small area of cortical hypodensity more superiorly on image 27, but this may also be stable. No acute intracranial hemorrhage identified. No midline shift, mass effect, or evidence of intracranial mass lesion. Small chronic left PCA infarct re- demonstrated. No ventriculomegaly. IMPRESSION: 1. No definite evolving cortically based infarct. Suspicion of right peri-Rolandic white matter infarct (series 201, image 24). 2. No acute intracranial hemorrhage or  mass effect. Electronically Signed   By: Genevie Ann M.D.   On: 03/03/2016 14:01   Ct Chest W Contrast  03/03/2016  CLINICAL DATA:  Lymphadenopathy in the neck and upper chest, suspicious for malignancy. EXAM: CT CHEST WITH CONTRAST TECHNIQUE: Multidetector CT imaging of the chest was performed during intravenous contrast administration. CONTRAST:  80 mL Omnipaque 300 COMPARISON:  CTA neck 03/02/2016 FINDINGS: Cardiac enlargement. Coronary artery calcification. Calcification of thoracic aorta. Normal caliber thoracic aorta without aneurysm or dissection. Great vessel origins are patent. Enlarged lymph node demonstrated in the left aortopulmonic window measuring about 2.5 cm diameter. Additional scattered pretracheal and right paratracheal lymph nodes are not pathologically enlarged. No significant lymphadenopathy in the axilla. Prominent pericardial recess. Esophagus is decompressed. Atelectasis in the lung bases. No focal parenchymal mass. Airways appear patent. No pleural effusions. No pneumothorax. Included portions of the upper abdominal organs demonstrate splenic enlargement. Cholelithiasis. Complex appearing lesion in the upper pole right kidney measuring 3.3 cm, likely a multi-septated cyst. Degenerative changes throughout the spine. No destructive bone lesions. Old left rib fractures. IMPRESSION: Nonspecific lymphadenopathy demonstrated in the AP window with lymph nodes measuring up to 2.5 cm diameter. Malignant or reactive lymph nodes could have this appearance. Atelectasis in the lung bases. Splenic enlargement. Enlarged spleen with enlarged lymph nodes could indicate lymphoma. Complex cystic lesion in the right kidney. Cholelithiasis. Electronically Signed   By: Lucienne Capers M.D.   On: 03/03/2016 03:31    Scheduled Meds: . apixaban  5 mg Oral BID  . atorvastatin  5 mg Oral q1800  . bacitracin-polymyxin b   Both Eyes BID  . carvedilol  3.125 mg Oral BID WC  . dutasteride  0.5 mg Oral Daily    And  . tamsulosin  0.4 mg Oral Daily  . fluticasone  2 spray Each Nare BID  . gabapentin  100 mg Oral TID  . guaiFENesin  600 mg Oral BID  . pantoprazole  40 mg Oral Daily  . pramipexole  0.25 mg Oral QHS  . sertraline  150 mg Oral Daily  . sodium chloride flush  3 mL Intravenous Q12H  . [START ON 03/08/2016] Vitamin D (Ergocalciferol)  50,000 Units Oral Q Wed   Continuous Infusions: . sodium chloride 75 mL/hr at 03/02/16 1815      Time spent: 25 minutes    Micheal Nicholson, [place]  [facility] Pager [phone]. If 7PM-7AM, please contact night-coverage at [email or web address], password [facility] 03/04/2016, 12:59 PM  LOS: 2 days

## 2016-03-04 NOTE — Evaluation (Signed)
Occupational Therapy Evaluation Patient Details Name: Micheal Nicholson MRN: [id number] DOB: 1920-03-12 Today's Date: 03/04/2016    History of Present Illness 82 y.o. male with a history of atrial fibrillation on Eliquis, hypertension and CHF brought to the emergency room and code stroke status following onset of left hemiplegia. Patient indicated that he felt weakness and unsteady gait at 4 AM. Onset of left hemiplegia was at 8 AM. He was last known well at bedtime at 10 PM on 03/01/2016. CT scan of his head showed no acute intracranial abnormality. CT angiogram showed no large vessel occlusion. A high-grade right M3 stenosis was seen, however. High-grade stenosis of right vertebral V2 segment was noted. Abnormal 2 cm AP window lymphadenopathy, suspicious for malignancy was noted and follow-up CT of the chest with contrast is recommended   Clinical Impression   This 82 yo male admitted with above presents to acute OT with deficits below affecting his PLOF of independent with basic ADLs and some IADLs. He will benefit from acute OT with follow up OT on CIR to give him the best chance at increasing his independence to a min A level or better.    Follow Up Recommendations  CIR    Equipment Recommendations   (TBD next venue)       Precautions / Restrictions Precautions Precautions: Fall Precaution Comments: L hemiparesis Restrictions Weight Bearing Restrictions: No      Mobility Bed Mobility Overal bed mobility: Needs Assistance Bed Mobility: Rolling;Sidelying to Sit Rolling: Max assist (to right) Sidelying to sit: Max assist          Transfers Overall transfer level: Needs assistance Equipment used: 2 person hand held assist Transfers: Sit to/from [facility] Sit to Stand: Mod assist;+2 physical assistance Stand pivot transfers: Mod assist;+2 physical assistance       General transfer comment: took labored shuffle steps to recliner 1 foot away     Balance Overall balance assessment: Needs assistance Sitting-balance support: Single extremity supported;Feet supported Sitting balance-Leahy Scale: Poor Sitting balance - Comments: Worked on sitting EOB keeping midline static and dynamic balance       Standing balance comment: Varied while sitting EOB from zero to fair                            ADL Overall ADL's : Needs assistance/impaired Eating/Feeding: Supervision/ safety;Set up;Sitting   Grooming: Wash/dry face;Supervision/safety (with min A for balance at EOB)   Upper Body Bathing: Moderate assistance;Sitting   Lower Body Bathing: Total assistance (+2 mod A sit<>stand)   Upper Body Dressing : Maximal assistance;Sitting   Lower Body Dressing: Total assistance (+2 mod A sit<>stand)   Toilet Transfer: +2 for physical assistance;Moderate assistance;Stand-pivot (bed>recliner)   Toileting- Clothing Manipulation and Hygiene: Total assistance (+2 mod A sit<>stand)               Vision Vision Assessment?: Yes Eye Alignment: Within Functional Limits Ocular Range of Motion: Restricted on the left Tracking/Visual Pursuits: Decreased smoothness of horizontal tracking (especally to the left) Visual Fields: No apparent deficits (with having him reach in all directions to touch my hand and reach for my pen)          Pertinent Vitals/Pain Pain Assessment: No/denies pain     Hand Dominance Right   Extremity/Trunk Assessment Upper Extremity Assessment Upper Extremity Assessment: LUE deficits/detail LUE Deficits / Details: no AROM noted, PROM WNL to 90 degrees shoulder flexion, decreased elbow flexion (increased  tone/resistance); rest of arm PROM WNL  LUE Coordination: decreased fine motor;decreased gross motor           Communication Communication Communication: HOH   Cognition Arousal/Alertness: Awake/alert Behavior During Therapy: WFL for tasks assessed/performed Overall Cognitive Status:  Impaired/Different from baseline Area of Impairment: Safety/judgement         Safety/Judgement: Decreased awareness of safety;Decreased awareness of deficits (actually pt intermittently aware he is leaning to right right while seated EOB, but does not attempt to correct unless cued to do so)                    Home Living Family/patient expects to be discharged to:: Private residence Living Arrangements: Children Available Help at Discharge: Family Type of Home: House Home Access: Stairs to enter Technical brewer of Steps: 2   Home Layout: One level     Bathroom Shower/Tub: Occupational psychologist: [place]: Environmental consultant - 2 wheels;Cane - single point;Shower seat   Additional Comments: Pt lives with his older brother, who is not able to help pt. Per one of pt's sons there is no one to stay with pt 24/7 that can A him  Lives With: Family (older brother)    Prior Functioning/Environment Level of Independence: Independent             OT Diagnosis: Generalized weakness;Cognitive deficits;Disturbance of vision;Hemiplegia non-dominant side   OT Problem List: Decreased strength;Decreased range of motion;Impaired balance (sitting and/or standing);Impaired vision/perception;Decreased coordination;Decreased cognition;Decreased safety awareness;Decreased knowledge of use of DME or AE;Impaired tone;Impaired UE functional use   OT Treatment/Interventions: Self-care/ADL training;Patient/family education;Balance training;Therapeutic activities;Cognitive remediation/compensation;DME and/or AE instruction    OT Goals(Current goals can be found in the care plan section) Acute Rehab OT Goals Patient Stated Goal: to get up OOB; but then wanting to get back in bed not longer after he was up OT Goal Formulation: With patient Time For Goal Achievement: 03/18/16 Potential to Achieve Goals: Fair  OT Frequency: Min 3X/week   Barriers to D/C: Decreased  caregiver support             End of Session Equipment Utilized During Treatment: Gait belt Nurse Communication: Mobility status (+2 mod A stand pivot)  Activity Tolerance: Patient tolerated treatment well Patient left: in chair;with call bell/phone within reach;with chair alarm set   Time: [id number] OT Time Calculation (min): 55 min Charges:  OT General Charges $OT Visit: 1 Procedure OT Evaluation $OT Eval Moderate Complexity: 1 Procedure OT Treatments $Self Care/Home Management : 8-22 mins $Therapeutic Activity: 23-37 mins  Almon Register [id number] 03/04/2016, 2:43 PM

## 2016-03-04 NOTE — Progress Notes (Signed)
STROKE TEAM PROGRESS NOTE   HISTORY OF PRESENT ILLNESS Micheal Nicholson is an 82 y.o. male with a history of atrial fibrillation on Eliquis, hypertension and CHF brought to the emergency room and code stroke status following onset of left hemiplegia. Patient indicated that he felt weakness and unsteady gait at 4 AM. Onset of left hemiplegia was at 8 AM. He was last known well at bedtime at 10 PM on 03/01/2016. CT scan of his head showed no acute intracranial abnormality. CT angiogram showed no large vessel occlusion. A high-grade right M3 stenosis was seen, however. High-grade stenosis of right vertebral V2 segment was noted. Abnormal 2 cm AP window lymphadenopathy, suspicious for malignancy was noted and follow-up CT of the chest with contrast is recommended. Patient's deficits improved fairly rapidly. NIH stroke score at the time of this evaluation was 7. At the end of this evaluation he had residual primarily proximal left upper extremity weakness as his most outstanding deficit. Patient was not administered IV t-PA secondary to on anticoagulation with Eliquis; rapidly improving deficits. He was admitted for further evaluation and treatment.   SUBJECTIVE (INTERVAL HISTORY) The patient's son was at the bedside. The patient is requesting Xanax for anxiety and sleep. Apparently he been on this medication at home for quite some time. I discussed treatment options for atrial fibrillation on eliquis with new stroke. A decision was made to change the patient from Eliquis to Coumadin with INR 2.5-3.5 instead of add ASA to eliquis after discussion with pt and son.   OBJECTIVE Temp:  [97.8 F (36.6 C)-98.2 F (36.8 C)] 97.8 F (36.6 C) (03/11 1450) Pulse Rate:  [61-122] 70 (03/11 1450) Cardiac Rhythm:  [-] Ventricular paced (03/11 0700) Resp:  [16-19] 16 (03/11 1450) BP: (119-142)/(70-90) 131/74 mmHg (03/11 1450) SpO2:  [95 %-100 %] 96 % (03/11 1450) Weight:  [94.303 kg (207 lb 14.4 oz)] 94.303 kg  (207 lb 14.4 oz) (03/11 0623)  CBC:   Recent Labs Lab 03/02/16 0906 03/02/16 0916 03/03/16 0633  WBC 5.8  --  5.0  NEUTROABS 4.2  --   --   HGB 12.3* 12.9* 11.9*  HCT 35.8* 38.0* 36.4*  MCV 86.9  --  87.3  PLT 150  --  149*    Basic Metabolic Panel:   Recent Labs Lab 03/02/16 1719 03/03/16 0633 03/04/16 0534  NA 138 137 138  K 4.7 4.2 4.2  CL 101 101 104  CO2 24 26 25   GLUCOSE 116* 108* 107*  BUN 15 16 21*  CREATININE 1.28* 1.22 1.21  CALCIUM 9.2 9.0 9.1  PHOS 4.5  --   --     Lipid Panel:     Component Value Date/Time   CHOL 125 03/03/2016 0633   TRIG 59 03/03/2016 0633   HDL 29* 03/03/2016 0633   CHOLHDL 4.3 03/03/2016 0633   VLDL 12 03/03/2016 0633   LDLCALC 84 03/03/2016 0633   HgbA1c:  Lab Results  Component Value Date   HGBA1C 5.7* 03/03/2016   Urine Drug Screen:     Component Value Date/Time   LABOPIA NONE DETECTED 03/02/2016 1137   COCAINSCRNUR NONE DETECTED 03/02/2016 1137   LABBENZ POSITIVE* 03/02/2016 1137   AMPHETMU NONE DETECTED 03/02/2016 1137   THCU NONE DETECTED 03/02/2016 1137   LABBARB NONE DETECTED 03/02/2016 1137      IMAGING I have personally reviewed the radiological images below and agree with the radiology interpretations.  Dg Chest 2 View 03/02/2016  Cardiomegaly with central pulmonary vascular congestion  and mild bilateral interstitial edema suggesting volume overload/CHF. Suspect some degree of chronic CHF based on multiple prior exams, perhaps slightly worsened today.   Ct Angio Head & Neck W/cm &/or Wo/cm 03/02/2016   1. Positive for right MCA M3 lesions in the middle sylvian division. Negative for emergent large vessel occlusion. No more proximal anterior circulation stenosis.  2. High-grade stenosis right vertebral artery V2 segment, favor due to soft atherosclerotic plaque. Otherwise negative posterior circulation.  3. Abnormal 2 cm AP window lymphadenopathy most suspicious for malignant nodal disease. Recommend  follow-up chest CT with IV contrast to evaluate further.  4. Ectatic thoracic aorta and tortuous proximal great vessels.   Ct Chest W Contrast 03/03/2016    Nonspecific lymphadenopathy demonstrated in the AP window with lymph nodes measuring up to 2.5 cm diameter. Malignant or reactive lymph nodes could have this appearance. Atelectasis in the lung bases. Splenic enlargement. Enlarged spleen with enlarged lymph nodes could indicate lymphoma. Complex cystic lesion in the right kidney. Cholelithiasis.   Ct head & Cervical Spine Wo Contrast 03/02/2016  Atrophy with mild small vessel chronic ischemic changes of deep cerebral white matter. 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 No acute intracranial abnormalities. Osseous demineralization with degenerative disc and facet disease changes of the cervical spine as above. No acute abnormalities.   CT head 03/03/16 1. No definite evolving cortically based infarct. Suspicion of right peri-Rolandic white matter infarct (series 201, image 24). 2. No acute intracranial hemorrhage or mass effect.  2D echo - Left ventricle: The cavity size was normal. There was mild  concentric hypertrophy. Systolic function was severely reduced.  The estimated ejection fraction was in the range of 25% to 30%.  Diffuse hypokinesis. - Aortic valve: There was mild regurgitation. - Aortic root: The aortic root was normal in size. - Mitral valve: There was mild regurgitation. Valve area by  pressure half-time: 1.08 cm^2. - Left atrium: The atrium was mildly dilated. - Right ventricle: The cavity size was moderately dilated. Wall  thickness was normal. Systolic function was moderately reduced. - Right atrium: The atrium was moderately dilated. - Tricuspid valve: There was moderate-severe regurgitation. - Pulmonary arteries: Systolic pressure was severely increased. PA  peak pressure: 65 mm Hg (S). - Inferior vena cava: The vessel was normal in size. - Pericardium,  extracardiac: There was no pericardial effusion   PHYSICAL EXAM Frail elderly male not in distress. . Afebrile. Head is nontraumatic. Neck is supple without bruit.    Cardiac exam no murmur or gallop. Lungs are clear to auscultation. Distal pulses are well felt. Neurological Exam ;  Awake alert oriented 3 normal speech and language. Minimum dysarthria and sclerosis a few words. No gaze preference and extraocular movements are full range. Blinks to threat bilaterally. Mild left lower facial weakness. Tongue midline. Left hemiplegia with 2/5 left upper extremity strength and 4/5 left lower extremity strength. Normal strength on the right and tone. Deep tendon reflexes are depressed on the left and normal on the right. Left plantar upgoing right downgoing. Diminished left hemibody sensation compared to the right. Gait was not tested.   ASSESSMENT/PLAN [name] is a 82 y.o. male with history of atrial fibrillation on Eliquis, hypertension and CHF presenting with left hemiplegia. He did not receive IV t-PA due to on anticoagulation with Eliquis; rapidly improving deficits.   Stroke:  Non-dominant right brain infarct, embolic secondary to persistent atrial fibrillation vs. Cardiomyopathy low EF 25% vs. ? Malignancy. Pt has been compliant with eliquis  5mg  bid but still developed new embolic stroke. Discussed with pt and son about options for further treatment and they optioned coumadin with INR 2.5-3.5 over add ASA 81 to eliquis.  Resultant  Left hemiparesis  CT angio head & neck right MCA M3 lesion. High-grade stenosis R V2. Old left PCA infarct.   Repeat CT of head - suspicious for right MCA cortical infarct  CT chest nonspecific lymphadenopathy   2D Echo in 12/2015 EF 35-40%.   2D Echo on 03/03/2016 - EF 25-30%.   LDL 84  HgbA1c 5.7  Eliquis for VTE prophylaxis Diet Heart Room service appropriate?: Yes; Fluid consistency:: Thin  Eliquis (apixaban) daily prior to  admission, now on Eliquis (apixaban) daily Plan to switch to Coumadin with lovenox bridge, INR goal 2.5-3.5.  Patient counseled to be compliant with his antithrombotic medications  Ongoing aggressive stroke risk factor management  Therapy recommendations:  CIR recommended  Disposition:  pending  Atrial Fibrillation  Home anticoagulation:  Eliquis (apixaban) daily continued in the hospital.   On eliquis compliant but continues to developed new embolic stroke  After discussion with pt and son, will switch to Coumadin with lovenox bridge and INR goal 2.5-3.5.   Cardiomyopathy   12/2015 EF 35-40%  Repeat TTE 02/2016 EF 25-30%  Consider cardiology consultation  On eliquis now but will switch to coumadin with INR goal 2.5-3.5  Hypertension  Stable  Hyperlipidemia  Home meds:  No statin  LDL 84, goal < 70  On lipitor 5mg    Continue statin at discharge  Other Stroke Risk Factors  Advanced age  Former Cigarette smoker  L leg neuropathy/RLS  Increase mirapex to 0.25 mg daily  Other Active Problems  Chronic combined systolic and diastolic CHF, NYHA class 2  Symptomatic sinus bradycardia, status post [employer] pacemaker placed 01/13/2016 by Dr. Rayann Heman  Lymphadenopathy, in chest and spleen  Mild anemia  GERD  BPH  Mild renal insufficiency  Hospital day # 2  Neurology will sign off. Please call with questions. Pt will follow up with Dr. Erlinda Hong at [facility] in about 2 months. Thanks for the consult.  Rosalin Hawking, MD PhD Stroke Neurology 03/04/2016 5:45 PM   To contact Stroke Continuity provider, please refer to [email or web address] After hours, contact General Neurology

## 2016-03-05 LAB — TROPONIN I
Troponin I: 0.03 ng/mL (ref <0.031)
Troponin I: 0.03 ng/mL (ref <0.031)
Troponin I: 0.03 ng/mL (ref <0.031)

## 2016-03-05 MED ORDER — WARFARIN SODIUM 5 MG PO TABS
5.0000 mg | ORAL_TABLET | Freq: Once | ORAL | Status: AC
  Administered 2016-03-05: 5 mg via ORAL
  Filled 2016-03-05: qty 1

## 2016-03-05 MED ORDER — PHENOL 1.4 % MT LIQD
1.0000 | OROMUCOSAL | Status: DC | PRN
  Administered 2016-03-05 (×2): 1 via OROMUCOSAL
  Filled 2016-03-05: qty 177

## 2016-03-05 MED ORDER — ENOXAPARIN SODIUM 100 MG/ML ~~LOC~~ SOLN
95.0000 mg | Freq: Two times a day (BID) | SUBCUTANEOUS | Status: DC
  Administered 2016-03-05 – 2016-03-06 (×3): 95 mg via SUBCUTANEOUS
  Filled 2016-03-05 (×3): qty 1

## 2016-03-05 MED ORDER — WARFARIN - PHARMACIST DOSING INPATIENT: Freq: Every day | Status: DC

## 2016-03-05 NOTE — Progress Notes (Signed)
[facility] PROGRESS NOTE  Micheal Nicholson [id number] DOB: [birth year] DOA: 03/02/2016 PCP: [name], MD Brief narrative 82 year old male with history of paroxysmal A. fib with slow ventricular response and symptomatic bradycardia requiring pacemaker on 01/13/2016, on anticoagulation, hypertension, CHF with EF of 35 and 40%, BPH, anxiety,  brought to the ED on 03/02/2016 as a code stroke. Patient woke up on the morning feeling unsteady on his feet. Shortly he fell out of bed striking his head. As per EMS had dense left hemiplegia and code stroke was activated. When arrived to the ED is definite aphasia had been improving. TPA was not given as patient is on Eliquis. Patient denied any fever, headache, chest pain, palpitations, shortness of breath abdominal pain or bowel and bladder symptoms.  head CT done showed old left occipital infarct. Neurology was consulted and CT angiogram of the head and neck done (MRI contraindicated due to pacemaker) showed right MCA M3 lesions in the middle sylvian division. Patient admitted to hospitalist service.   Assessment/Plan: Acute embolic right-sided CVA Residual left UE paresis. ( slowly improving) Has underlying A. fib. CT of the head and neck shows a right MCA M3 lesion. Recent 2-D echo shows EF of 35 and 40%. Repeat echo results pending.  LDL of 84. A1c 5.7. Added low-dose statin. -CIR consulted. Seen by speech and swallow and recommends regular diet with thin liquids. -Appreciate stroke team recommendations. Patient has recurrent stroke while on anticoagulation. They discussed with patient's son and recommended switching to either low-dose aspirin and Plavix or different anticoagulation. Son wanted to switch patient to Coumadin.  Started on Coumadin with goal INR of 2.5-2.5. Will place with subcutaneous Lovenox until INR therapeutic.  Pulmonary lymphadenopathy Incidental finding on CT of the neck. A chest CT with contrast done showed  nonspecific lymphadenopathy measuring 2.5 cm (could do malignant versus reactive). Also has an enlarged and enlarged lymph nodes and a complex cystic right kidney lesion. Son reports that patient was lifelong nonsmoker and never had exposure to asbestos. Recommend outpatient follow-up.    Chronic Systolic CHF Euvolemic. Continue beta blocker. Added low-dose statin. Held ACE inhibitor given AKI on admission. Monitor intake and output  Chronic A. fib Continue beta blocker. Allow permissive hypertension. Anticoagulation switch to Coumadin with target INR of 2.5-3.5. Bridging with subcutaneous Lovenox.  Essential hypertension Continue beta blocker. Holding ACE inhibitor given mild AKI on presentation.  Peripheral neuropathy with lower extremity symptoms Added primipexole and Neurontin. Symptoms now well controlled.  Anxiety Added Xanax.   DVT prophylaxis: Coumadin , bridging with Lovenox  Diet: Heart healthy  Code Status: Full code Family Communication: None at bedside Disposition Plan: Pending CIR approval   Consultants:  Neurology  Procedures:  CT angiogram of the head and neck  2-D echo  Antibiotics:  None  HPI/Subjective: Seen and examined. Pain in his lower extremities resolved. Able to move his left hand today.  Objective: Filed Vitals:   03/05/16 0502 03/05/16 0956  BP: 129/75 120/67  Pulse: 61 62  Temp: 98.7 F (37.1 C) 97.7 F (36.5 C)  Resp: 18 18    Intake/Output Summary (Last 24 hours) at 03/05/16 1142 Last data filed at 03/05/16 0502  Gross per 24 hour  Intake      0 ml  Output   1000 ml  Net  -1000 ml   Filed Weights   03/02/16 1003 03/04/16 0623  Weight: 79.096 kg (174 lb 6 oz) 94.303 kg (207 lb 14.4 oz)    Exam:  General: Elderly male lying in bed not in distress  HEENT: Moist mucosa   chest: Clear bilaterally  CVS: S1 and S2 irregular, no murmurs rub or gallop  GI: Soft, nondistended, nontender, bowel sounds  present  Musculoskeletal: No edema  CNS: left UE 3/5 power , 4+/5 power in LLE    Data Reviewed: Basic Metabolic Panel:  Recent Labs Lab 03/02/16 0906 03/02/16 0916 03/02/16 1719 03/03/16 0633 03/04/16 0534  NA 139 138 138 137 138  K 4.4 4.3 4.7 4.2 4.2  CL 102 99* 101 101 104  CO2 24  --  24 26 25   GLUCOSE 109* 107* 116* 108* 107*  BUN 15 19 15 16  21*  CREATININE 1.29* 1.20 1.28* 1.22 1.21  CALCIUM 9.3  --  9.2 9.0 9.1  PHOS  --   --  4.5  --   --    Liver Function Tests:  Recent Labs Lab 03/02/16 0906 03/02/16 1719  AST 20  --   ALT 12*  --   ALKPHOS 54  --   BILITOT 1.2  --   PROT 6.2*  --   ALBUMIN 3.7 3.9   No results for input(s): LIPASE, AMYLASE in the last 168 hours. No results for input(s): AMMONIA in the last 168 hours. CBC:  Recent Labs Lab 03/02/16 0906 03/02/16 0916 03/03/16 0633  WBC 5.8  --  5.0  NEUTROABS 4.2  --   --   HGB 12.3* 12.9* 11.9*  HCT 35.8* 38.0* 36.4*  MCV 86.9  --  87.3  PLT 150  --  149*   Cardiac Enzymes:  Recent Labs Lab 03/04/16 0534 03/04/16 1050 03/04/16 1755 03/04/16 2236 03/05/16 0527  TROPONINI 0.03 0.04* 0.04* 0.04* 0.03   BNP (last 3 results)  Recent Labs  03/14/15 1737  BNP 813.5*    ProBNP (last 3 results) No results for input(s): PROBNP in the last 8760 hours.  CBG:  Recent Labs Lab 03/02/16 0923  GLUCAP 107*    No results found for this or any previous visit (from the past 240 hour(s)).   Studies: Ct Head Wo Contrast  03/03/2016  CLINICAL DATA:  81 year old male with left side neuro deficits. Right M3 lesion suspected on CTA head and neck yesterday. Initial encounter. EXAM: CT HEAD WITHOUT CONTRAST TECHNIQUE: Contiguous axial images were obtained from the base of the skull through the vertex without intravenous contrast. COMPARISON:  Head CT without contrast 03/02/2016. FINDINGS: Continued bubbly opacity in the left sphenoid sinus. Other paranasal sinuses and mastoids are stable. No  acute osseous abnormality identified. Rightward gaze. Stable orbit and scalp soft tissues. No definite evolving cortically based infarct is identified. There is suggestion of increased right peri-Rolandic subcortical white matter hypodensity on series 201, image 24. More anterior pre motor area subcortical hypodensity on image 23 appears stable. There does appear to be a small area of cortical hypodensity more superiorly on image 27, but this may also be stable. No acute intracranial hemorrhage identified. No midline shift, mass effect, or evidence of intracranial mass lesion. Small chronic left PCA infarct re- demonstrated. No ventriculomegaly. IMPRESSION: 1. No definite evolving cortically based infarct. Suspicion of right peri-Rolandic white matter infarct (series 201, image 24). 2. No acute intracranial hemorrhage or mass effect. Electronically Signed   By: Genevie Ann M.D.   On: 03/03/2016 14:01    Scheduled Meds: . atorvastatin  5 mg Oral q1800  . bacitracin-polymyxin b   Both Eyes BID  . carvedilol  3.125 mg Oral  BID WC  . dutasteride  0.5 mg Oral Daily   And  . tamsulosin  0.4 mg Oral Daily  . enoxaparin (LOVENOX) injection  95 mg Subcutaneous Q12H  . fluticasone  2 spray Each Nare BID  . gabapentin  200 mg Oral TID  . guaiFENesin  600 mg Oral BID  . pantoprazole  40 mg Oral Daily  . pramipexole  0.25 mg Oral QHS  . sertraline  150 mg Oral Daily  . sodium chloride flush  3 mL Intravenous Q12H  . [START ON 03/08/2016] Vitamin D (Ergocalciferol)  50,000 Units Oral Q Wed  . warfarin  5 mg Oral ONCE-1800  . Warfarin - Pharmacist Dosing Inpatient   Does not apply q1800   Continuous Infusions:      Time spent: 25 minutes    Dionna Wiedemann, [place]  [facility] Pager [phone]. If 7PM-7AM, please contact night-coverage at [email or web address], password [facility] 03/05/2016, 11:42 AM  LOS: 3 days

## 2016-03-05 NOTE — Progress Notes (Signed)
ANTICOAGULATION CONSULT NOTE - Initial Consult  Pharmacy Consult for Lovenox and Coumadin Indication: atrial fibrillation  Allergies  Allergen Reactions  . Chocolate Other (See Comments)    Acid reflux    Patient Measurements: Height: 5\' 10"  (177.8 cm) Weight: 207 lb 14.4 oz (94.303 kg) IBW/kg (Calculated) : 73  Vital Signs: Temp: 98.7 F (37.1 C) (03/12 0502) Temp Source: Oral (03/12 0502) BP: 129/75 mmHg (03/12 0502) Pulse Rate: 61 (03/12 0502)  Labs:  Recent Labs  03/02/16 0906 03/02/16 0916 03/02/16 1719  03/03/16 [id number]  03/04/16 0534  03/04/16 1755 03/04/16 2236 03/05/16 0527  HGB 12.3* 12.9*  --   --  11.9*  --   --   --   --   --   --   HCT 35.8* 38.0*  --   --  36.4*  --   --   --   --   --   --   PLT 150  --   --   --  149*  --   --   --   --   --   --   APTT 39*  --   --   --   --   --   --   --   --   --   --   LABPROT 18.4*  --   --   --   --   --   --   --   --   --   --   INR 1.52*  --   --   --   --   --   --   --   --   --   --   CREATININE 1.29* 1.20 1.28*  --  1.22  --  1.21  --   --   --   --   TROPONINI  --   --  <0.03  < > 0.04*  < > 0.03  < > 0.04* 0.04* 0.03  < > = values in this interval not displayed.  Estimated Creatinine Clearance: 44 mL/min (by C-G formula based on Cr of 1.21).   Medical History: Past Medical History  Diagnosis Date  . Hypertension   . Anxiety   . BPH (benign prostatic hyperplasia)   . Chronic combined systolic and diastolic CHF, NYHA class 2 ([place]) 04/17/2015  . CHF (congestive heart failure) ([place])   . Symptomatic sinus bradycardia     PPM Dr. Rayann Heman, 01/13/16, STJ device  . GERD (gastroesophageal reflux disease)   . Arthritis     Medications:  Prescriptions prior to admission  Medication Sig Dispense Refill Last Dose  . ALPRAZolam (XANAX) 0.5 MG tablet TAKE 1 TABLET TWICE DAILY AS NEEDED (Patient taking differently: TAKE 1 TABLET TWICE DAILY AS NEEDED FOR ANXIETY) 60 tablet 2 Past Week at Unknown time  .  apixaban (ELIQUIS) 5 MG TABS tablet Take 1 tablet (5 mg total) by mouth 2 (two) times daily. 60 tablet 5 03/02/2016 at Unknown time  . Camphor-Eucalyptus-Menthol (VICKS VAPORUB EX) Apply 1 application topically daily as needed (to help breathe).    Past Week at Unknown time  . carvedilol (COREG) 3.125 MG tablet Take 1 tablet (3.125 mg total) by mouth 2 (two) times daily with a meal. 60 tablet 3 03/02/2016 at 0800  . Dutasteride-Tamsulosin HCl 0.5-0.4 MG CAPS TAKE 1 CAPSULE BY MOUTH DAILY 90 capsule 3 03/01/2016 at Unknown time  . fluticasone (FLONASE) 50 MCG/ACT nasal spray Place 2 sprays into both nostrils 2 (  two) times daily.    03/01/2016 at Unknown time  . furosemide (LASIX) 40 MG tablet Take 1 tablet (40 mg total) by mouth daily. 60 tablet 11 03/01/2016 at Unknown time  . guaiFENesin (MUCINEX) 600 MG 12 hr tablet Take 1 tablet (600 mg total) by mouth 2 (two) times daily. 30 tablet 0 03/01/2016 at Unknown time  . KLOR-CON M10 10 MEQ tablet Take 1 tablet by mouth daily.   03/01/2016 at Unknown time  . lisinopril (PRINIVIL,ZESTRIL) 5 MG tablet TAKE 1 TABLET BY MOUTH EVERY DAY 30 tablet 11 03/01/2016 at Unknown time  . omeprazole (PRILOSEC) 20 MG capsule TAKE ONE CAPSULE BY MOUTH EVERY DAY 30 capsule 5 03/01/2016 at Unknown time  . pramipexole (MIRAPEX) 0.125 MG tablet TAKE 1 TABLET BY MOUTH AT BEDTIME 30 tablet 5 03/01/2016 at Unknown time  . sertraline (ZOLOFT) 100 MG tablet TAKE 1.5 TABLETS BY MOUTH DAILY (Patient taking differently: TAKE 1.5 TABLETS (150mg ) BY MOUTH DAILY) 45 tablet 3 03/01/2016 at Unknown time  . Vitamin D, Ergocalciferol, (DRISDOL) 50000 UNITS CAPS capsule Take 1 capsule (50,000 Units total) by mouth every 7 (seven) days. 30 capsule 0 03/01/2016 at Unknown time    Assessment: 82 y.o. M on apixaban PTA for afib. Presents this admission with R embolic brain infarct even though on apixaban. Plan to change to Lovenox bridge to Coumadin with goal increased goal INR 2.5-3.5 per neurology. Last dose of  apixaban 3/11 ~2100. CBC stable. 3/9 INR 1.52 - likely elevated at baseline due to apixaban. Estimated CrCl 44 ml/min.  Goal of Therapy:  INR goal 2.5-3.5 per neuro note Monitor platelets by anticoagulation protocol: Yes   Plan:  D/c apixaban Lovenox 95 mg SQ q12h Coumadin 5mg  po tonight Daily INR CBC q72h while on Lovenox  Sherlon Handing, PharmD, BCPS Clinical pharmacist, pager [phone] 03/05/2016,7:38 AM

## 2016-03-06 DIAGNOSIS — I69322 Dysarthria following cerebral infarction: Secondary | ICD-10-CM | POA: Diagnosis not present

## 2016-03-06 DIAGNOSIS — R1011 Right upper quadrant pain: Secondary | ICD-10-CM | POA: Diagnosis not present

## 2016-03-06 DIAGNOSIS — I255 Ischemic cardiomyopathy: Secondary | ICD-10-CM

## 2016-03-06 DIAGNOSIS — I480 Paroxysmal atrial fibrillation: Secondary | ICD-10-CM | POA: Diagnosis not present

## 2016-03-06 DIAGNOSIS — I10 Essential (primary) hypertension: Secondary | ICD-10-CM | POA: Diagnosis not present

## 2016-03-06 DIAGNOSIS — D696 Thrombocytopenia, unspecified: Secondary | ICD-10-CM | POA: Diagnosis not present

## 2016-03-06 DIAGNOSIS — I509 Heart failure, unspecified: Secondary | ICD-10-CM | POA: Diagnosis not present

## 2016-03-06 DIAGNOSIS — I5022 Chronic systolic (congestive) heart failure: Secondary | ICD-10-CM | POA: Diagnosis not present

## 2016-03-06 DIAGNOSIS — I69354 Hemiplegia and hemiparesis following cerebral infarction affecting left non-dominant side: Secondary | ICD-10-CM | POA: Diagnosis not present

## 2016-03-06 DIAGNOSIS — Z5181 Encounter for therapeutic drug level monitoring: Secondary | ICD-10-CM | POA: Diagnosis not present

## 2016-03-06 DIAGNOSIS — G8194 Hemiplegia, unspecified affecting left nondominant side: Secondary | ICD-10-CM | POA: Diagnosis not present

## 2016-03-06 DIAGNOSIS — K219 Gastro-esophageal reflux disease without esophagitis: Secondary | ICD-10-CM | POA: Diagnosis not present

## 2016-03-06 DIAGNOSIS — R590 Localized enlarged lymph nodes: Secondary | ICD-10-CM | POA: Diagnosis not present

## 2016-03-06 DIAGNOSIS — M6281 Muscle weakness (generalized): Secondary | ICD-10-CM | POA: Diagnosis not present

## 2016-03-06 DIAGNOSIS — I639 Cerebral infarction, unspecified: Secondary | ICD-10-CM | POA: Diagnosis not present

## 2016-03-06 DIAGNOSIS — I2510 Atherosclerotic heart disease of native coronary artery without angina pectoris: Secondary | ICD-10-CM | POA: Diagnosis not present

## 2016-03-06 DIAGNOSIS — M5416 Radiculopathy, lumbar region: Secondary | ICD-10-CM | POA: Diagnosis not present

## 2016-03-06 DIAGNOSIS — I4891 Unspecified atrial fibrillation: Secondary | ICD-10-CM | POA: Diagnosis not present

## 2016-03-06 DIAGNOSIS — I6789 Other cerebrovascular disease: Secondary | ICD-10-CM | POA: Diagnosis not present

## 2016-03-06 DIAGNOSIS — R1310 Dysphagia, unspecified: Secondary | ICD-10-CM | POA: Diagnosis not present

## 2016-03-06 DIAGNOSIS — G8190 Hemiplegia, unspecified affecting unspecified side: Secondary | ICD-10-CM | POA: Diagnosis not present

## 2016-03-06 DIAGNOSIS — R278 Other lack of coordination: Secondary | ICD-10-CM | POA: Diagnosis not present

## 2016-03-06 DIAGNOSIS — G629 Polyneuropathy, unspecified: Secondary | ICD-10-CM | POA: Diagnosis not present

## 2016-03-06 DIAGNOSIS — I5043 Acute on chronic combined systolic (congestive) and diastolic (congestive) heart failure: Secondary | ICD-10-CM | POA: Diagnosis not present

## 2016-03-06 DIAGNOSIS — I482 Chronic atrial fibrillation: Secondary | ICD-10-CM | POA: Diagnosis not present

## 2016-03-06 DIAGNOSIS — N400 Enlarged prostate without lower urinary tract symptoms: Secondary | ICD-10-CM | POA: Diagnosis not present

## 2016-03-06 DIAGNOSIS — I638 Other cerebral infarction: Secondary | ICD-10-CM | POA: Diagnosis not present

## 2016-03-06 LAB — CBC
HEMATOCRIT: 35.4 % — AB (ref 39.0–52.0)
HEMOGLOBIN: 11.9 g/dL — AB (ref 13.0–17.0)
MCH: 28.9 pg (ref 26.0–34.0)
MCHC: 33.6 g/dL (ref 30.0–36.0)
MCV: 85.9 fL (ref 78.0–100.0)
Platelets: 161 10*3/uL (ref 150–400)
RBC: 4.12 MIL/uL — AB (ref 4.22–5.81)
RDW: 12.7 % (ref 11.5–15.5)
WBC: 4.6 10*3/uL (ref 4.0–10.5)

## 2016-03-06 LAB — PROTIME-INR
INR: 1.4 (ref 0.00–1.49)
Prothrombin Time: 17.3 seconds — ABNORMAL HIGH (ref 11.6–15.2)

## 2016-03-06 LAB — TROPONIN I
Troponin I: 0.04 ng/mL — ABNORMAL HIGH (ref <0.031)
Troponin I: 0.03 ng/mL (ref <0.031)
Troponin I: 0.03 ng/mL (ref <0.031)

## 2016-03-06 MED ORDER — WARFARIN SODIUM 5 MG PO TABS: 5.0000 mg | ORAL_TABLET | Freq: Once | ORAL | Status: DC

## 2016-03-06 MED ORDER — WARFARIN SODIUM 5 MG PO TABS: 5.0000 mg | ORAL_TABLET | Freq: Every day | ORAL | Status: DC

## 2016-03-06 MED ORDER — GABAPENTIN 100 MG PO CAPS: 200.0000 mg | ORAL_CAPSULE | Freq: Three times a day (TID) | ORAL | Status: DC

## 2016-03-06 MED ORDER — PATIENT'S GUIDE TO USING COUMADIN BOOK
Freq: Once | Status: AC
  Administered 2016-03-06: 08:00:00
  Filled 2016-03-06: qty 1

## 2016-03-06 MED ORDER — SENNOSIDES-DOCUSATE SODIUM 8.6-50 MG PO TABS: 1.0000 | ORAL_TABLET | Freq: Every evening | ORAL | Status: DC | PRN

## 2016-03-06 MED ORDER — ENOXAPARIN SODIUM 60 MG/0.6ML ~~LOC~~ SOLN: 90.0000 mg | Freq: Two times a day (BID) | SUBCUTANEOUS | Status: DC

## 2016-03-06 MED ORDER — POLYETHYLENE GLYCOL 3350 17 G PO PACK: 17.0000 g | PACK | Freq: Every day | ORAL | Status: DC

## 2016-03-06 MED ORDER — ALPRAZOLAM 0.5 MG PO TABS: 0.5000 mg | ORAL_TABLET | Freq: Two times a day (BID) | ORAL | Status: DC | PRN

## 2016-03-06 MED ORDER — WARFARIN VIDEO: Freq: Once | Status: DC

## 2016-03-06 MED ORDER — POLYETHYLENE GLYCOL 3350 17 G PO PACK
17.0000 g | PACK | Freq: Every day | ORAL | Status: DC
  Administered 2016-03-06: 17 g via ORAL
  Filled 2016-03-06: qty 1

## 2016-03-06 MED ORDER — ATORVASTATIN CALCIUM 10 MG PO TABS: 5.0000 mg | ORAL_TABLET | Freq: Every day | ORAL | Status: DC

## 2016-03-06 NOTE — Care Management Note (Signed)
Case Management Note  Patient Details  Name: Micheal Nicholson MRN: [id number] Date of Birth: March 23, 1920  Subjective/Objective:                    Action/Plan: Patient discharging to [facility] today. No further needs per CM.   Expected Discharge Date:   (Pending)               Expected Discharge Plan:  [facility]  In-House Referral:     Discharge planning Services     Post Acute Care Choice:    Choice offered to:     DME Arranged:    DME Agency:     HH Arranged:    [place] Agency:     Status of Service:  Completed, signed off  Medicare Important Message Given:  Yes Date Medicare IM Given:    Medicare IM give by:    Date Additional Medicare IM Given:    Additional Medicare Important Message give by:     If discussed at [place] of Stay Meetings, dates discussed:    Additional Comments:  Pollie Friar, RN 03/06/2016, 2:54 PM

## 2016-03-06 NOTE — Progress Notes (Signed)
   03/06/16 1300  Output (mL)  Urine 700 mL  Urine Characteristics  Urinary Incontinence No  Urine Color Amber  Urine Appearance Clear  Urine Odor No odor  Bladder Scan Volume (mL) 344 mL    Patient verbalized that he needs to be relaxed to void this am. Med given, pt able to void. Please see chart above. MD aware. Will continue to monitor.  Miangel Flom, RN.

## 2016-03-06 NOTE — Progress Notes (Signed)
ANTICOAGULATION CONSULT NOTE - Initial Consult  Pharmacy Consult for Lovenox and Coumadin Indication: atrial fibrillation  Allergies  Allergen Reactions  . Chocolate Other (See Comments)    Acid reflux    Patient Measurements: Height: 5\' 10"  (177.8 cm) Weight: 207 lb 14.4 oz (94.303 kg) IBW/kg (Calculated) : 73  Vital Signs: Temp: 97.6 F (36.4 C) (03/13 0538) Temp Source: Oral (03/13 0538) BP: 122/72 mmHg (03/13 0538) Pulse Rate: 51 (03/13 0538)  Labs:  Recent Labs  03/04/16 0534  03/05/16 1843 03/05/16 2308 03/06/16 0732  HGB  --   --   --   --  11.9*  HCT  --   --   --   --  35.4*  PLT  --   --   --   --  161  LABPROT  --   --   --   --  17.3*  INR  --   --   --   --  1.40  CREATININE 1.21  --   --   --   --   TROPONINI 0.03  < > 0.03 0.04* 0.03  < > = values in this interval not displayed.  Estimated Creatinine Clearance: 44 mL/min (by C-G formula based on Cr of 1.21).   Medical History: Past Medical History  Diagnosis Date  . Hypertension   . Anxiety   . BPH (benign prostatic hyperplasia)   . Chronic combined systolic and diastolic CHF, NYHA class 2 ([place]) 04/17/2015  . CHF (congestive heart failure) ([place])   . Symptomatic sinus bradycardia     PPM Dr. Rayann Heman, 01/13/16, STJ device  . GERD (gastroesophageal reflux disease)   . Arthritis     Medications:  Prescriptions prior to admission  Medication Sig Dispense Refill Last Dose  . ALPRAZolam (XANAX) 0.5 MG tablet TAKE 1 TABLET TWICE DAILY AS NEEDED (Patient taking differently: TAKE 1 TABLET TWICE DAILY AS NEEDED FOR ANXIETY) 60 tablet 2 Past Week at Unknown time  . apixaban (ELIQUIS) 5 MG TABS tablet Take 1 tablet (5 mg total) by mouth 2 (two) times daily. 60 tablet 5 03/02/2016 at Unknown time  . Camphor-Eucalyptus-Menthol (VICKS VAPORUB EX) Apply 1 application topically daily as needed (to help breathe).    Past Week at Unknown time  . carvedilol (COREG) 3.125 MG tablet Take 1 tablet (3.125 mg total) by  mouth 2 (two) times daily with a meal. 60 tablet 3 03/02/2016 at 0800  . Dutasteride-Tamsulosin HCl 0.5-0.4 MG CAPS TAKE 1 CAPSULE BY MOUTH DAILY 90 capsule 3 03/01/2016 at Unknown time  . fluticasone (FLONASE) 50 MCG/ACT nasal spray Place 2 sprays into both nostrils 2 (two) times daily.    03/01/2016 at Unknown time  . furosemide (LASIX) 40 MG tablet Take 1 tablet (40 mg total) by mouth daily. 60 tablet 11 03/01/2016 at Unknown time  . guaiFENesin (MUCINEX) 600 MG 12 hr tablet Take 1 tablet (600 mg total) by mouth 2 (two) times daily. 30 tablet 0 03/01/2016 at Unknown time  . KLOR-CON M10 10 MEQ tablet Take 1 tablet by mouth daily.   03/01/2016 at Unknown time  . lisinopril (PRINIVIL,ZESTRIL) 5 MG tablet TAKE 1 TABLET BY MOUTH EVERY DAY 30 tablet 11 03/01/2016 at Unknown time  . omeprazole (PRILOSEC) 20 MG capsule TAKE ONE CAPSULE BY MOUTH EVERY DAY 30 capsule 5 03/01/2016 at Unknown time  . pramipexole (MIRAPEX) 0.125 MG tablet TAKE 1 TABLET BY MOUTH AT BEDTIME 30 tablet 5 03/01/2016 at Unknown time  . sertraline (ZOLOFT)  100 MG tablet TAKE 1.5 TABLETS BY MOUTH DAILY (Patient taking differently: TAKE 1.5 TABLETS (150mg ) BY MOUTH DAILY) 45 tablet 3 03/01/2016 at Unknown time  . Vitamin D, Ergocalciferol, (DRISDOL) 50000 UNITS CAPS capsule Take 1 capsule (50,000 Units total) by mouth every 7 (seven) days. 30 capsule 0 03/01/2016 at Unknown time    Assessment: 82 y.o. M on apixaban PTA for afib. Presents this admission with R embolic brain infarct even though on apixaban. Plan to change to Lovenox bridge to Coumadin with goal increased goal INR 2.5-3.5 per neurology. Last dose of apixaban 3/11 ~2100. CBC stable. 3/9 INR 1.52 - likely elevated at baseline due to apixaban. Estimated CrCl 44 ml/min.  Today's INR 1.4.  No bleeding or complications noted.  Goal of Therapy:  INR goal 2.5-3.5 per neuro note Monitor platelets by anticoagulation protocol: Yes   Plan:  Continue Lovenox 95 mg SQ q12h Coumadin 5mg  po  tonight Daily INR CBC q72h while on Lovenox  Uvaldo Rising, BCPS  Clinical Pharmacist Pager [phone]  03/06/2016 9:58 AM

## 2016-03-06 NOTE — Discharge Summary (Signed)
Physician Discharge Summary  Micheal Nicholson [id number] DOB: July 29, 1920 DOA: 03/02/2016  PCP: [name], MD  Admit date: 03/02/2016 Discharge date: 03/06/2016  Time spent: 35  minutes  Recommendations for Outpatient Follow-up:  #1 Discharged to skilled nursing facility. Follow-up with neurology (Dr.Xu)  in 2 months. #2 Patient will be switched to Coumadin for stroke with A. fib. He will be bridged with Lovenox until INR therapeutic. Goal INR would be 2.5-75. #3 Recommend follow-up chest CT in 6-12 months for pulmonary lymphadenopathy   Discharge Diagnoses:  Principal Problem:   Cerebral embolism with cerebral infarction   Active Problems:   Essential hypertension   BPH (benign prostatic hypertrophy)   GERD (gastroesophageal reflux disease)   Paroxysmal atrial fibrillation (HCC)   Chronic combined systolic and diastolic CHF, NYHA class 2 (HCC)   CAD (coronary artery disease)   Hemiplegia (HCC)   Thrombocytopenia (HCC)   Acute left hemiparesis (HCC)   Peripheral neuropathic pain (HCC)   Cardiomyopathy, ischemic   Discharge Condition: Fair  Diet recommendation: Heart healthy  CODE STATUS: Full code  Filed Weights   03/02/16 1003 03/04/16 0623  Weight: 79.096 kg (174 lb 6 oz) 94.303 kg (207 lb 14.4 oz)    History of present illness:  Please refer to admission H&P for details, in brief, 82 year old male with history of paroxysmal A. fib with slow ventricular response and symptomatic bradycardia requiring pacemaker on 01/13/2016, on anticoagulation, hypertension, CHF with EF of 35 and 40%, BPH, anxiety, brought to the ED on 03/02/2016 as a code stroke. Patient woke up on the morning feeling unsteady on his feet. Shortly he fell out of bed striking his head. As per EMS had dense left hemiplegia and code stroke was activated. When arrived to the ED is definite aphasia had been improving. TPA was not given as patient is on Eliquis. Patient denied any fever, headache,  chest pain, palpitations, shortness of breath abdominal pain or bowel and bladder symptoms. head CT done showed old left occipital infarct. Neurology was consulted and CT angiogram of the head and neck done (MRI contraindicated due to pacemaker) showed right MCA M3 lesions in the middle sylvian division. Patient admitted to hospitalist service.  Hospital Course:  Assessment/Plan: Acute embolic right-sided CVA Residual left UE paresis. ( improving) Has underlying A. fib on anticoagulation. CT of the head and neck shows a right MCA M3 lesion. Recent 2-D echo shows EF of 35 and 40%. Repeat echo result again shows EF of 25-30% with diffuse hypokinesis. No source of emboli. Elevated PA pressure of 65 mmHg. LDL of 84. A1c 5.7. Added low-dose statin. Seen by speech and swallow and recommends regular diet with thin liquids. -Appreciate stroke team recommendations. Patient has recurrent stroke while on anticoagulation. They discussed with patient's son and recommended switching to either low-dose aspirin and Plavix or different anticoagulation. Son wanted to switch patient to Coumadin.  Started on Coumadin with goal INR of 2.5-2.5. Bridging with subcutaneous Lovenox until INR therapeutic.  Pulmonary lymphadenopathy Incidental finding on CT of the neck. A chest CT with contrast done showed nonspecific lymphadenopathy measuring 2.5 cm (could do malignant versus reactive). Also has an enlarged and enlarged lymph nodes and a complex cystic right kidney lesion. Son reports that patient was lifelong nonsmoker and never had exposure to asbestos. Recommend outpatient follow-up.    Chronic Systolic CHF Euvolemic. Continue beta blocker. Added low-dose statin. Held ACE inhibitor given AKI on admission. Monitor intake and output  Chronic A. fib Continue beta blocker. Allow  permissive hypertension. Anticoagulation switch to Coumadin with target INR of 2.5-3.5. Bridging with subcutaneous Lovenox until  therapeutic.  Essential hypertension Continue beta blocker. Held ACE inhibitor given mild tracheitis on presentation. Will resume upon discharge.  Peripheral neuropathy with lower extremity symptoms Added Neurontin and adjusted dose. Symptoms much better now. Continue pramipexole.  Anxiety Adjusted Xanax dose.    Family Communication: None at bedside. Spoke with son while in the hospital Disposition Plan: Skilled nursing facility   Consultants:  Neurology  Procedures:  CT angiogram of the head and neck  2-D echo  CT chest  Antibiotics:  None  Discharge Exam: Filed Vitals:   03/06/16 0538 03/06/16 1014  BP: 122/72 97/73  Pulse: 51 88  Temp: 97.6 F (36.4 C) 98 F (36.7 C)  Resp: 20 16     General: Elderly male lying in bed not in distress  HEENT: Moist mucosa  chest: Clear bilaterally  CVS: S1 and S2 irregular, no murmurs rub or gallop  GI: Soft, nondistended, nontender, bowel sounds present  Musculoskeletal: No edema  CNS: left UE 3/5 power , 4+/5 power in LLE  Discharge Instructions   Discharge Instructions    Ambulatory referral to Neurology    Complete by:  As directed   Pt will follow up with Dr. Erlinda Hong at [facility] in about 2 months. Thanks.          Current Discharge Medication List    START taking these medications   Details  atorvastatin (LIPITOR) 10 MG tablet Take 0.5 tablets (5 mg total) by mouth daily at 6 PM. Qty: 30 tablet, Refills: 0    enoxaparin (LOVENOX) 60 MG/0.6ML injection Inject 0.9 mLs (90 mg total) into the skin every 12 (twelve) hours. Qty: 10 mL, Refills: 0    gabapentin (NEURONTIN) 100 MG capsule Take 2 capsules (200 mg total) by mouth 3 (three) times daily. Qty: 60 capsule, Refills: 0    polyethylene glycol (MIRALAX / GLYCOLAX) packet Take 17 g by mouth daily. Qty: 14 each, Refills: 0    senna-docusate (SENOKOT-S) 8.6-50 MG tablet Take 1 tablet by mouth at bedtime as needed for mild constipation. Qty: 10 tablet,  Refills: 0    warfarin (COUMADIN) 5 MG tablet Take 1 tablet (5 mg total) by mouth daily at 6 PM. Qty: 10 tablet, Refills: 0      CONTINUE these medications which have CHANGED   Details  ALPRAZolam (XANAX) 0.5 MG tablet Take 1 tablet (0.5 mg total) by mouth 2 (two) times daily as needed for anxiety. Qty: 30 tablet, Refills: 0      CONTINUE these medications which have NOT CHANGED   Details  Camphor-Eucalyptus-Menthol (VICKS VAPORUB EX) Apply 1 application topically daily as needed (to help breathe).     carvedilol (COREG) 3.125 MG tablet Take 1 tablet (3.125 mg total) by mouth 2 (two) times daily with a meal. Qty: 60 tablet, Refills: 3    Dutasteride-Tamsulosin HCl 0.5-0.4 MG CAPS TAKE 1 CAPSULE BY MOUTH DAILY Qty: 90 capsule, Refills: 3    fluticasone (FLONASE) 50 MCG/ACT nasal spray Place 2 sprays into both nostrils 2 (two) times daily.     furosemide (LASIX) 40 MG tablet Take 1 tablet (40 mg total) by mouth daily. Qty: 60 tablet, Refills: 11    guaiFENesin (MUCINEX) 600 MG 12 hr tablet Take 1 tablet (600 mg total) by mouth 2 (two) times daily. Qty: 30 tablet, Refills: 0   Associated Diagnoses: Acute upper respiratory infection    KLOR-CON M10 10  MEQ tablet Take 1 tablet by mouth daily.    lisinopril (PRINIVIL,ZESTRIL) 5 MG tablet TAKE 1 TABLET BY MOUTH EVERY DAY Qty: 30 tablet, Refills: 11    omeprazole (PRILOSEC) 20 MG capsule TAKE ONE CAPSULE BY MOUTH EVERY DAY Qty: 30 capsule, Refills: 5    pramipexole (MIRAPEX) 0.125 MG tablet TAKE 1 TABLET BY MOUTH AT BEDTIME Qty: 30 tablet, Refills: 5    sertraline (ZOLOFT) 100 MG tablet TAKE 1.5 TABLETS BY MOUTH DAILY Qty: 45 tablet, Refills: 3    Vitamin D, Ergocalciferol, (DRISDOL) 50000 UNITS CAPS capsule Take 1 capsule (50,000 Units total) by mouth every 7 (seven) days. Qty: 30 capsule, Refills: 0      STOP taking these medications     apixaban (ELIQUIS) 5 MG TABS tablet        Allergies  Allergen Reactions  .  Chocolate Other (See Comments)    Acid reflux   Follow-up Information    Follow up with Xu,Jindong, MD. Schedule an appointment as soon as possible for a visit in 2 months.   Specialty:  Neurology   Why:  stroke clinic   Contact information:   [address] [place] [address] [phone]       Please follow up.   Why:  MD at SNF       The results of significant diagnostics from this hospitalization (including imaging, microbiology, ancillary and laboratory) are listed below for reference.    Significant Diagnostic Studies: Ct Angio Head W/cm &/or Wo Cm  03/02/2016  ADDENDUM REPORT: 03/02/2016 10:51 ADDENDUM: Study discussed by telephone with Dr. Wallie Char on 03/02/2016 at 1048 hours. Electronically Signed   By: Genevie Ann M.D.   On: 03/02/2016 10:51  03/02/2016  CLINICAL DATA:  82 year old male with left hemiplegia. Right gaze deviation. Code stroke. Initial encounter. EXAM: CT ANGIOGRAPHY HEAD AND NECK TECHNIQUE: Multidetector CT imaging of the head and neck was performed using the standard protocol during bolus administration of intravenous contrast. Multiplanar CT image reconstructions and MIPs were obtained to evaluate the vascular anatomy. Carotid stenosis measurements (when applicable) are obtained utilizing NASCET criteria, using the distal internal carotid diameter as the denominator. CONTRAST:  48mL OMNIPAQUE IOHEXOL 350 MG/ML SOLN COMPARISON:  Head and cervical Spine CT without contrast 0914 hours today. FINDINGS: CTA NECK Skeleton: Partially visible healed appearing left lateral sixth rib fracture. Scoliosis and degenerative changes in the visible spine. Osteopenia. No acute osseous abnormality identified. Left greater than right paranasal sinus opacification. Other neck: Dependent ground-glass opacity in both lungs. Apical pulmonary septal thickening. Superimposed nodular superior segment right lower lobe subpleural opacity. Enlarged aortic 0 pulmonary window  soft tissue most resembling malignant lymphadenopathy measuring 24 mm short axis (series 7, image 28). Nearby precarinal lymph nodes measuring up to 10 mm short axis. Negative thyroid, larynx, pharynx, parapharyngeal spaces, retropharyngeal space, sublingual space, submandibular glands, and parotid glands. Postoperative changes to the globes. Mild right side gaze deviation. No cervical lymphadenopathy. Aortic arch: 3 vessel arch configuration CT head ectatic thoracic aorta, 36 mm diameter in the proximal arch and 35 mm diameter proximal descending aorta. Right carotid system: Tortuous brachiocephalic artery and proximal right CCA without stenosis. Negative right CCA. Negative right carotid bifurcation, widely patent right ICA origin. Soft plaque in the posterior right ICA bulb without stenosis. Otherwise negative cervical right ICA. Left carotid system: Calcified plaque at the left CCA origin without stenosis. Tortuous proximal left CCA. Mild calcified plaque at the left carotid bifurcation which is widely  patent. Mildly tortuous but otherwise negative cervical left ICA. Vertebral arteries: Tortuous proximal right subclavian artery with a kinked appearance (series 602, image 129). An calcified plaque, but otherwise no stenosis. Normal right vertebral artery origin. Short segment High-grade stenosis right V2 segment (series 7, image 85). At the C4 level. This does not resemble dissection, but rather atherosclerotic stenosis, or less likely related to adjacent cervical spine degeneration. Distally the cervical right vertebral artery remains patent and is without additional stenosis to the skullbase. Tortuous proximal left subclavian artery with a kinked appearance (sagittal series 603, image 120) and calcified plaque but otherwise no stenosis. Calcified plaque near the left vertebral artery origin, but no associated stenosis. Tortuous left V1 segment. Fairly codominant vertebral arteries. Tortuous left V3 segment  without stenosis to the skullbase. CTA HEAD Posterior circulation: Codominant distal vertebral arteries. Normal right PICA origin. The right vertebral artery is tapered distal to the PICA, but without hemodynamically significant stenosis. No stenosis of the distal left vertebral artery. Patent vertebrobasilar junction. No basilar artery stenosis. Normal SCA and PCA origins. Posterior communicating arteries are diminutive or absent. Bilateral PCA branches are within normal limits. Anterior circulation: Both ICA siphons are patent. Mild for age bilateral siphon calcified plaque. No siphon stenosis. Ophthalmic artery origins are within normal limits. Normal carotid termini. ACA origins, anterior communicating artery, and bilateral ACA branches are within normal limits. Left MCA M1 segment, bifurcation, and left MCA branches are within normal limits. Right MCA M1 segment and bifurcation are patent. The major M2 branches appear patent and within normal limits. There is multifocal segment irregularity and high-grade stenosis in at a middle sylvian M3 branch best seen on series 606, image 12. Otherwise distal bilateral MCA branches appear symmetric. Venous sinuses: Patent. Anatomic variants: None. IMPRESSION: 1. Positive for right MCA M3 lesions in the middle sylvian division. Negative for emergent large vessel occlusion. No more proximal anterior circulation stenosis. 2. High-grade stenosis right vertebral artery V2 segment, favor due to soft atherosclerotic plaque. Otherwise negative posterior circulation. 3. Abnormal 2 cm AP window lymphadenopathy most suspicious for malignant nodal disease. Recommend follow-up chest CT with IV contrast to evaluate further. 4. Ectatic thoracic aorta and tortuous proximal great vessels. Electronically Signed: By: Genevie Ann M.D. On: 03/02/2016 10:44   Dg Chest 2 View  03/02/2016  CLINICAL DATA:  CVA with inability to move left arm. History of hypertension, anxiety, CHF. EXAM: CHEST  2  VIEW COMPARISON:  Chest x-rays dated 01/14/2016, 12/29/2015 and 03/14/2015. FINDINGS: Cardiomegaly is stable. Overall cardiomediastinal silhouette is stable in size and configuration. The right upper mediastinal prominence is stable, most likely related to enlargement of the right thyroid (typically multinodular goiter). Left chest wall pacemaker/AICD stable in position. Again noted is central pulmonary vascular congestion and mild bilateral interstitial edema, slightly worsened in the interval. Degenerative changes are again seen within the slightly kyphotic thoracic spine. No acute- appearing osseous abnormality. IMPRESSION: Cardiomegaly with central pulmonary vascular congestion and mild bilateral interstitial edema suggesting volume overload/CHF. Suspect some degree of chronic CHF based on multiple prior exams, perhaps slightly worsened today. Electronically Signed   By: Franki Cabot M.D.   On: 03/02/2016 20:07   Ct Head Wo Contrast  03/03/2016  CLINICAL DATA:  82 year old male with left side neuro deficits. Right M3 lesion suspected on CTA head and neck yesterday. Initial encounter. EXAM: CT HEAD WITHOUT CONTRAST TECHNIQUE: Contiguous axial images were obtained from the base of the skull through the vertex without intravenous contrast. COMPARISON:  Head  CT without contrast 03/02/2016. FINDINGS: Continued bubbly opacity in the left sphenoid sinus. Other paranasal sinuses and mastoids are stable. No acute osseous abnormality identified. Rightward gaze. Stable orbit and scalp soft tissues. No definite evolving cortically based infarct is identified. There is suggestion of increased right peri-Rolandic subcortical white matter hypodensity on series 201, image 24. More anterior pre motor area subcortical hypodensity on image 23 appears stable. There does appear to be a small area of cortical hypodensity more superiorly on image 27, but this may also be stable. No acute intracranial hemorrhage identified. No  midline shift, mass effect, or evidence of intracranial mass lesion. Small chronic left PCA infarct re- demonstrated. No ventriculomegaly. IMPRESSION: 1. No definite evolving cortically based infarct. Suspicion of right peri-Rolandic white matter infarct (series 201, image 24). 2. No acute intracranial hemorrhage or mass effect. Electronically Signed   By: Genevie Ann M.D.   On: 03/03/2016 14:01   Ct Head Wo Contrast  03/02/2016  CLINICAL DATA:  LEFT side weakness, RIGHT dominant gaze, history RIGHT-sided stroke, code stroke, essential hypertension, CHF, former smoker, atrial fibrillation, cardiomyopathy, renal failure EXAM: CT HEAD WITHOUT CONTRAST CT CERVICAL SPINE WITHOUT CONTRAST TECHNIQUE: Multidetector CT imaging of the head and cervical spine was performed following the standard protocol without intravenous contrast. Multiplanar CT image reconstructions of the cervical spine were also generated. COMPARISON:  None FINDINGS: CT HEAD FINDINGS Generalized atrophy. Normal ventricular morphology. No midline shift or mass effect. Small old LEFT occipital infarct. Small vessel chronic ischemic changes of deep cerebral white matter. No intracranial hemorrhage, mass lesion, evidence acute infarction, or extra-axial fluid collections. Mucosal thickening BILATERAL maxillary sinuses. Bones demineralized. CT CERVICAL SPINE FINDINGS Diffuse osseous demineralization. Prevertebral soft tissues normal thickness. Multilevel disc space narrowing and minimal endplate spur formation. Multilevel facet degenerative changes. Minimal anterolisthesis C7-T1 likely degenerative. Incomplete posterior arch C1, normal variant. Head rotation to the RIGHT with minimal rotary subluxation at C1-C2. Vertebral body heights maintained without fracture or additional subluxation. Visualized skullbase intact. Lung apices clear. IMPRESSION: Atrophy with mild small vessel chronic ischemic changes of deep cerebral white matter. Old LEFT occipital/PCA  territory infarct. No acute intracranial abnormalities. Osseous demineralization with degenerative disc and facet disease changes of the cervical spine as above. No acute abnormalities. Code Stroke results called to Dr. Nicole Kindred on 03/02/2016 at 0938 hours. Electronically Signed   By: Lavonia Dana M.D.   On: 03/02/2016 09:46   Ct Angio Neck W/cm &/or Wo/cm  03/02/2016  ADDENDUM REPORT: 03/02/2016 10:51 ADDENDUM: Study discussed by telephone with Dr. Wallie Char on 03/02/2016 at 1048 hours. Electronically Signed   By: Genevie Ann M.D.   On: 03/02/2016 10:51  03/02/2016  CLINICAL DATA:  82 year old male with left hemiplegia. Right gaze deviation. Code stroke. Initial encounter. EXAM: CT ANGIOGRAPHY HEAD AND NECK TECHNIQUE: Multidetector CT imaging of the head and neck was performed using the standard protocol during bolus administration of intravenous contrast. Multiplanar CT image reconstructions and MIPs were obtained to evaluate the vascular anatomy. Carotid stenosis measurements (when applicable) are obtained utilizing NASCET criteria, using the distal internal carotid diameter as the denominator. CONTRAST:  55mL OMNIPAQUE IOHEXOL 350 MG/ML SOLN COMPARISON:  Head and cervical Spine CT without contrast 0914 hours today. FINDINGS: CTA NECK Skeleton: Partially visible healed appearing left lateral sixth rib fracture. Scoliosis and degenerative changes in the visible spine. Osteopenia. No acute osseous abnormality identified. Left greater than right paranasal sinus opacification. Other neck: Dependent ground-glass opacity in both lungs. Apical pulmonary septal thickening. Superimposed  nodular superior segment right lower lobe subpleural opacity. Enlarged aortic 0 pulmonary window soft tissue most resembling malignant lymphadenopathy measuring 24 mm short axis (series 7, image 28). Nearby precarinal lymph nodes measuring up to 10 mm short axis. Negative thyroid, larynx, pharynx, parapharyngeal spaces, retropharyngeal  space, sublingual space, submandibular glands, and parotid glands. Postoperative changes to the globes. Mild right side gaze deviation. No cervical lymphadenopathy. Aortic arch: 3 vessel arch configuration CT head ectatic thoracic aorta, 36 mm diameter in the proximal arch and 35 mm diameter proximal descending aorta. Right carotid system: Tortuous brachiocephalic artery and proximal right CCA without stenosis. Negative right CCA. Negative right carotid bifurcation, widely patent right ICA origin. Soft plaque in the posterior right ICA bulb without stenosis. Otherwise negative cervical right ICA. Left carotid system: Calcified plaque at the left CCA origin without stenosis. Tortuous proximal left CCA. Mild calcified plaque at the left carotid bifurcation which is widely patent. Mildly tortuous but otherwise negative cervical left ICA. Vertebral arteries: Tortuous proximal right subclavian artery with a kinked appearance (series 602, image 129). An calcified plaque, but otherwise no stenosis. Normal right vertebral artery origin. Short segment High-grade stenosis right V2 segment (series 7, image 85). At the C4 level. This does not resemble dissection, but rather atherosclerotic stenosis, or less likely related to adjacent cervical spine degeneration. Distally the cervical right vertebral artery remains patent and is without additional stenosis to the skullbase. Tortuous proximal left subclavian artery with a kinked appearance (sagittal series 603, image 120) and calcified plaque but otherwise no stenosis. Calcified plaque near the left vertebral artery origin, but no associated stenosis. Tortuous left V1 segment. Fairly codominant vertebral arteries. Tortuous left V3 segment without stenosis to the skullbase. CTA HEAD Posterior circulation: Codominant distal vertebral arteries. Normal right PICA origin. The right vertebral artery is tapered distal to the PICA, but without hemodynamically significant stenosis. No  stenosis of the distal left vertebral artery. Patent vertebrobasilar junction. No basilar artery stenosis. Normal SCA and PCA origins. Posterior communicating arteries are diminutive or absent. Bilateral PCA branches are within normal limits. Anterior circulation: Both ICA siphons are patent. Mild for age bilateral siphon calcified plaque. No siphon stenosis. Ophthalmic artery origins are within normal limits. Normal carotid termini. ACA origins, anterior communicating artery, and bilateral ACA branches are within normal limits. Left MCA M1 segment, bifurcation, and left MCA branches are within normal limits. Right MCA M1 segment and bifurcation are patent. The major M2 branches appear patent and within normal limits. There is multifocal segment irregularity and high-grade stenosis in at a middle sylvian M3 branch best seen on series 606, image 12. Otherwise distal bilateral MCA branches appear symmetric. Venous sinuses: Patent. Anatomic variants: None. IMPRESSION: 1. Positive for right MCA M3 lesions in the middle sylvian division. Negative for emergent large vessel occlusion. No more proximal anterior circulation stenosis. 2. High-grade stenosis right vertebral artery V2 segment, favor due to soft atherosclerotic plaque. Otherwise negative posterior circulation. 3. Abnormal 2 cm AP window lymphadenopathy most suspicious for malignant nodal disease. Recommend follow-up chest CT with IV contrast to evaluate further. 4. Ectatic thoracic aorta and tortuous proximal great vessels. Electronically Signed: By: Genevie Ann M.D. On: 03/02/2016 10:44   Ct Chest W Contrast  03/03/2016  CLINICAL DATA:  Lymphadenopathy in the neck and upper chest, suspicious for malignancy. EXAM: CT CHEST WITH CONTRAST TECHNIQUE: Multidetector CT imaging of the chest was performed during intravenous contrast administration. CONTRAST:  80 mL Omnipaque 300 COMPARISON:  CTA neck 03/02/2016 FINDINGS: Cardiac  enlargement. Coronary artery  calcification. Calcification of thoracic aorta. Normal caliber thoracic aorta without aneurysm or dissection. Great vessel origins are patent. Enlarged lymph node demonstrated in the left aortopulmonic window measuring about 2.5 cm diameter. Additional scattered pretracheal and right paratracheal lymph nodes are not pathologically enlarged. No significant lymphadenopathy in the axilla. Prominent pericardial recess. Esophagus is decompressed. Atelectasis in the lung bases. No focal parenchymal mass. Airways appear patent. No pleural effusions. No pneumothorax. Included portions of the upper abdominal organs demonstrate splenic enlargement. Cholelithiasis. Complex appearing lesion in the upper pole right kidney measuring 3.3 cm, likely a multi-septated cyst. Degenerative changes throughout the spine. No destructive bone lesions. Old left rib fractures. IMPRESSION: Nonspecific lymphadenopathy demonstrated in the AP window with lymph nodes measuring up to 2.5 cm diameter. Malignant or reactive lymph nodes could have this appearance. Atelectasis in the lung bases. Splenic enlargement. Enlarged spleen with enlarged lymph nodes could indicate lymphoma. Complex cystic lesion in the right kidney. Cholelithiasis. Electronically Signed   By: Lucienne Capers M.D.   On: 03/03/2016 03:31   Ct Cervical Spine Wo Contrast  03/02/2016  CLINICAL DATA:  LEFT side weakness, RIGHT dominant gaze, history RIGHT-sided stroke, code stroke, essential hypertension, CHF, former smoker, atrial fibrillation, cardiomyopathy, renal failure EXAM: CT HEAD WITHOUT CONTRAST CT CERVICAL SPINE WITHOUT CONTRAST TECHNIQUE: Multidetector CT imaging of the head and cervical spine was performed following the standard protocol without intravenous contrast. Multiplanar CT image reconstructions of the cervical spine were also generated. COMPARISON:  None FINDINGS: CT HEAD FINDINGS Generalized atrophy. Normal ventricular morphology. No midline shift or mass  effect. Small old LEFT occipital infarct. Small vessel chronic ischemic changes of deep cerebral white matter. No intracranial hemorrhage, mass lesion, evidence acute infarction, or extra-axial fluid collections. Mucosal thickening BILATERAL maxillary sinuses. Bones demineralized. CT CERVICAL SPINE FINDINGS Diffuse osseous demineralization. Prevertebral soft tissues normal thickness. Multilevel disc space narrowing and minimal endplate spur formation. Multilevel facet degenerative changes. Minimal anterolisthesis C7-T1 likely degenerative. Incomplete posterior arch C1, normal variant. Head rotation to the RIGHT with minimal rotary subluxation at C1-C2. Vertebral body heights maintained without fracture or additional subluxation. Visualized skullbase intact. Lung apices clear. IMPRESSION: Atrophy with mild small vessel chronic ischemic changes of deep cerebral white matter. 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 No acute intracranial abnormalities. Osseous demineralization with degenerative disc and facet disease changes of the cervical spine as above. No acute abnormalities. Code Stroke results called to Dr. Nicole Kindred on 03/02/2016 at 0938 hours. Electronically Signed   By: Lavonia Dana M.D.   On: 03/02/2016 09:46    Microbiology: No results found for this or any previous visit (from the past 240 hour(s)).   Labs: Basic Metabolic Panel:  Recent Labs Lab 03/02/16 0906 03/02/16 0916 03/02/16 1719 03/03/16 0633 03/04/16 0534  NA 139 138 138 137 138  K 4.4 4.3 4.7 4.2 4.2  CL 102 99* 101 101 104  CO2 24  --  24 26 25   GLUCOSE 109* 107* 116* 108* 107*  BUN 15 19 15 16  21*  CREATININE 1.29* 1.20 1.28* 1.22 1.21  CALCIUM 9.3  --  9.2 9.0 9.1  PHOS  --   --  4.5  --   --    Liver Function Tests:  Recent Labs Lab 03/02/16 0906 03/02/16 1719  AST 20  --   ALT 12*  --   ALKPHOS 54  --   BILITOT 1.2  --   PROT 6.2*  --   ALBUMIN 3.7 3.9   No  results for input(s): LIPASE, AMYLASE in the  last 168 hours. No results for input(s): AMMONIA in the last 168 hours. CBC:  Recent Labs Lab 03/02/16 0906 03/02/16 0916 03/03/16 0633 03/06/16 0732  WBC 5.8  --  5.0 4.6  NEUTROABS 4.2  --   --   --   HGB 12.3* 12.9* 11.9* 11.9*  HCT 35.8* 38.0* 36.4* 35.4*  MCV 86.9  --  87.3 85.9  PLT 150  --  149* 161   Cardiac Enzymes:  Recent Labs Lab 03/05/16 1059 03/05/16 1843 03/05/16 2308 03/06/16 0732 03/06/16 1127  TROPONINI 0.03 0.03 0.04* 0.03 0.03   BNP: BNP (last 3 results)  Recent Labs  03/14/15 1737  BNP 813.5*    ProBNP (last 3 results) No results for input(s): PROBNP in the last 8760 hours.  CBG:  Recent Labs Lab 03/02/16 0923  GLUCAP 107*       Signed:  Louellen Molder MD.  [facility] 03/06/2016, 2:42 PM

## 2016-03-06 NOTE — Care Management Important Message (Signed)
Important Message  Patient Details  Name: Micheal Nicholson MRN: [id number] Date of Birth: 09/12/1920   Medicare Important Message Given:  Yes    Micheal Nicholson Micheal Nicholson 03/06/2016, 12:26 PM

## 2016-03-06 NOTE — Clinical Social Work Placement (Signed)
   CLINICAL SOCIAL WORK PLACEMENT  NOTE  Date:  03/06/2016  Patient Details  Name: Micheal Nicholson MRN: [id number] Date of Birth: 05/05/1920  Clinical Social Work is seeking post-discharge placement for this patient at the [place] level of care (*CSW will initial, date and re-position this form in  chart as items are completed):  Yes   Patient/family provided with [place] Work Department's list of facilities offering this level of care within the geographic area requested by the patient (or if unable, by the patient's family).  Yes   Patient/family informed of their freedom to choose among providers that offer the needed level of care, that participate in Medicare, Medicaid or managed care program needed by the patient, have an available bed and are willing to accept the patient.  Yes   Patient/family informed of [place]'s ownership interest in [facility] and [facility], as well as of the fact that they are under no obligation to receive care at these facilities.  PASRR submitted to EDS on 03/06/16     PASRR number received on 03/06/16     Existing PASRR number confirmed on       FL2 transmitted to all facilities in geographic area requested by pt/family on 03/06/16     FL2 transmitted to all facilities within larger geographic area on       Patient informed that his/her managed care company has contracts with or will negotiate with certain facilities, including the following:        Yes   Patient/family informed of bed offers received.  Patient chooses bed at  [facility] )     Physician recommends and patient chooses bed at      Patient to be transferred to  [facility] ) on 03/06/16.  Patient to be transferred to facility by  [facility] )     Patient family notified on 03/06/16 of transfer.  Name of family member notified:   (Pt's son and dtr-in-law, Gene and Denice [place])      PHYSICIAN Please sign FL2     Additional Comment:    _______________________________________________ Rozell Searing, LCSW 03/06/2016, 4:16 PM

## 2016-03-06 NOTE — Clinical Social Work Note (Signed)
Clinical Social Work Assessment  Patient Details  Name: Micheal Nicholson MRN: [id number] Date of Birth: March 22, 1920  Date of referral:  03/06/16               Reason for consult:  Facility Placement, Discharge Planning                Permission sought to share information with:  Family Supports, Customer service manager, Case Optician, dispensing granted to share information::  Yes, Verbal Permission Granted  Name::      (Micheal and Micheal Nicholson)  Agency::   (SNF's )  Relationship::   (Son and Dtr-in-law )  Sport and exercise psychologist Information:   [phone])  Housing/Transportation Living arrangements for the past 2 months:  [place] of Information:  Patient, Adult Children Patient Interpreter Needed:  None Criminal Activity/Legal Involvement Pertinent to Current Situation/Hospitalization:  No - Comment as needed Significant Relationships:  Adult Children Lives with:  Self Do you feel safe going back to the place where you live?  No Need for family participation in patient care:  Yes (Comment)  Care giving concerns:  Patient requiring short-term rehab placement at skilled level.    Social Worker assessment / plan:  [place] representative contacted pt's son and dtr-in-law in regards to SNF placement. CSW introduced CSW role and SNF process. Patient and family familiar with SNF process and prefers placement at [facility] and [facility]. CSW completed FL-2 and faxed clinicals to SNF in [place] area. Patient and family accepts bed offer from [facility]. Pt's dtr-in-law in complete admissions paperwork. No further concerns reported at this time.   Employment status:  Retired Nurse, adult PT Recommendations:  [place] / Referral to community resources:  [place]  Patient/Family's Response to care: Pt a/o x4. Pt and family pleasant and agreeable to SNF, [facility]. Pt's son and  dtr-in-law appreciated social work intervention.   Patient/Family's Understanding of and Emotional Response to Diagnosis, Current Treatment, and Prognosis: Pt's family knowledgeable of medical interventions.   Emotional Assessment Appearance:  Appears stated age Attitude/Demeanor/Rapport:   (Pleasant ) Affect (typically observed):  Accepting, Appropriate, Pleasant Orientation:  Oriented to Situation, Oriented to  Time, Oriented to Place, Oriented to Self Alcohol / Substance use:  Not Applicable Psych involvement (Current and /or in the community):  No (Comment)  Discharge Needs  Concerns to be addressed:  Care Coordination Readmission within the last 30 days:  No Current discharge risk:  Dependent with Mobility Barriers to Discharge:  Barriers Resolved   Glendon Axe, MSW, LCSWA [phone] 03/06/2016 4:14 PM

## 2016-03-06 NOTE — NC FL2 (Signed)
MEDICAID FL2 LEVEL OF CARE SCREENING TOOL     IDENTIFICATION  Patient Name: Micheal Nicholson Birthdate: 1920-04-04 Sex: male Admission Date (Current Location): 03/02/2016  [facility] [place] Number:  Herbalist and Address:  [facility] [place], [place] [address], [place], [place] [address]      Provider Number: [id number]  Attending Physician Name and Address:  Louellen Molder, MD  Relative Name and Phone Number:       Current Level of Care: Hospital Recommended Level of Care: [place] Prior Approval Number:    Date Approved/Denied:   PASRR Number: [id number]  Discharge Plan: SNF    Current Diagnoses: Patient Active Problem List   Diagnosis Date Noted  . CVA (cerebral infarction)   . Cardiomyopathy, ischemic   . Cerebral embolism with cerebral infarction 03/03/2016  . Acute blood loss anemia   . Thrombocytopenia ([place])   . Acute left hemiparesis ([place])   . Peripheral neuropathic pain ([place])   . Hemiplegia ([place]) 03/02/2016  . Left hemiparesis ([place]) 03/02/2016  . Adenopathy   . Long term current use of anticoagulant therapy 01/23/2016  . CAD (coronary artery disease) 01/23/2016  . Second degree Mobitz II AV block 01/13/2016  . Chronic systolic CHF (congestive heart failure) ([place])   . Persistent atrial fibrillation ([place])   . Atrial fibrillation with slow ventricular response ([place])   . Renal failure   . Bradycardia 12/29/2015  . Acute renal failure (ARF) ([place]) 12/29/2015  . Hyponatremia 12/29/2015  . Hypotension 12/29/2015  . Elevated troponin 12/29/2015  . Acute upper respiratory infection 12/23/2015  . Chronic combined systolic and diastolic CHF, NYHA class 2 ([place]) 04/17/2015  . Vitamin D deficiency 03/11/2015  . Cardiomyopathy -etiology uncertain [id number]  . Paroxysmal atrial fibrillation ([place]) 08/27/2014  . 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 H/O: stroke 08/27/2014  . GERD (gastroesophageal reflux  disease) 02/07/2011  . BPH (benign prostatic hypertrophy) 07/12/2010  . Anxiety state 11/02/2009  . Essential hypertension 11/02/2009    Orientation RESPIRATION BLADDER Height & Weight     Self, Time, Situation, Place  O2 (at 2L ) External catheter, Incontinent Weight: 207 lb 14.4 oz (94.303 kg) Height:  5\' 10"  (177.8 cm)  BEHAVIORAL SYMPTOMS/MOOD NEUROLOGICAL BOWEL NUTRITION STATUS   (NONE )  (NONE ) Continent Diet (Heart Healthy )  AMBULATORY STATUS COMMUNICATION OF NEEDS Skin   Extensive Assist Verbally Normal                       Personal Care Assistance Level of Assistance  Bathing, Feeding, Dressing Bathing Assistance: Maximum assistance Feeding assistance: Independent Dressing Assistance: Maximum assistance     Functional Limitations Info  Sight, Hearing Sight Info: Adequate Hearing Info: Adequate Speech Info: Adequate    SPECIAL CARE FACTORS FREQUENCY  PT (By licensed PT), OT (By licensed OT)     PT Frequency: 4 OT Frequency: 3            Contractures      Additional Factors Info  Code Status, Allergies Code Status Info: FULL CODE  Allergies Info: Chocolate            Current Medications (03/06/2016):  This is the current hospital active medication list Current Facility-Administered Medications  Medication Dose Route Frequency Provider Last Rate Last Dose  . 0.9 %  sodium chloride infusion  250 mL Intravenous PRN Dionne Milo, NP      . acetaminophen (TYLENOL) tablet 650 mg  650 mg Oral Q4H PRN Dionne Milo, NP   650 mg at 03/05/16 1437   Or  . acetaminophen (TYLENOL) suppository 650 mg  650 mg Rectal Q4H PRN Dionne Milo, NP      . ALPRAZolam Duanne Moron) tablet 0.5 mg  0.5 mg Oral BID PRN Rosalin Hawking, MD   0.5 mg at 03/06/16 0811  . atorvastatin (LIPITOR) tablet 5 mg  5 mg Oral q1800 Donzetta Starch, NP   5 mg at 03/05/16 1637  . bacitracin-polymyxin b (POLYSPORIN) ophthalmic ointment   Both Eyes BID Dionne Milo, NP   1  application at 0000000 2148  . carvedilol (COREG) tablet 3.125 mg  3.125 mg Oral BID WC Dionne Milo, NP   3.125 mg at 03/06/16 0811  . dutasteride (AVODART) capsule 0.5 mg  0.5 mg Oral Daily Albertine Patricia, MD   0.5 mg at 03/06/16 [id number]   And  . tamsulosin (FLOMAX) capsule 0.4 mg  0.4 mg Oral Daily Albertine Patricia, MD   0.4 mg at 03/06/16 0811  . enoxaparin (LOVENOX) injection 95 mg  95 mg Subcutaneous Q12H Franky Macho, RPH   95 mg at 03/06/16 0810  . fluticasone (FLONASE) 50 MCG/ACT nasal spray 2 spray  2 spray Each Nare BID Dionne Milo, NP   2 spray at 03/06/16 0813  . gabapentin (NEURONTIN) capsule 200 mg  200 mg Oral TID Nishant Dhungel, MD   200 mg at 03/06/16 0811  . guaiFENesin (MUCINEX) 12 hr tablet 600 mg  600 mg Oral BID Dionne Milo, NP   600 mg at 03/06/16 [id number]  . iohexol (OMNIPAQUE) 300 MG/ML solution 80 mL  80 mL Intravenous Once PRN Albertine Patricia, MD      . pantoprazole (PROTONIX) EC tablet 40 mg  40 mg Oral Daily Dionne Milo, NP   40 mg at 03/06/16 0811  . phenol (CHLORASEPTIC) mouth spray 1 spray  1 spray Mouth/Throat PRN Nishant Dhungel, MD   1 spray at 03/05/16 1637  . polyethylene glycol (MIRALAX / GLYCOLAX) packet 17 g  17 g Oral Daily Nishant Dhungel, MD   17 g at 03/06/16 1003  . pramipexole (MIRAPEX) tablet 0.25 mg  0.25 mg Oral QHS Donzetta Starch, NP   0.25 mg at 03/05/16 2143  . senna-docusate (Senokot-S) tablet 1 tablet  1 tablet Oral QHS PRN Dionne Milo, NP      . sertraline (ZOLOFT) tablet 150 mg  150 mg Oral Daily Dionne Milo, NP   150 mg at 03/06/16 0810  . sodium chloride flush (NS) 0.9 % injection 3 mL  3 mL Intravenous Q12H Dionne Milo, NP   3 mL at 03/05/16 2146  . sodium chloride flush (NS) 0.9 % injection 3 mL  3 mL Intravenous PRN Dionne Milo, NP      . Derrill Memo ON 03/08/2016] Vitamin D (Ergocalciferol) (DRISDOL) capsule 50,000 Units  50,000 Units Oral Q Wed Dionne Milo, NP      . warfarin  (COUMADIN) tablet 5 mg  5 mg Oral ONCE-1800 Jessica C Carney, RPH      . warfarin (COUMADIN) video   Does not apply Once Pat Patrick, RPH      . Warfarin - Pharmacist Dosing Inpatient   Does not apply q1800 Franky Macho, RPH      . zolpidem (AMBIEN) tablet 5 mg  5 mg Oral QHS PRN Dianne Dun, NP   5  mg at 03/03/16 2218     Discharge Medications: Please see discharge summary for a list of discharge medications.  Relevant Imaging Results:  Relevant Lab Results:   Additional Information SSN [id number]  Glendon Axe, MSW, LCSWA [phone] 03/06/2016 10:58 AM

## 2016-03-06 NOTE — Progress Notes (Signed)
Physical Therapy Treatment Patient Details Name: Micheal Nicholson MRN: [id number] DOB: [birth year] Today's Date: 03/06/2016    History of Present Illness 82 y.o. male with a history of atrial fibrillation on Eliquis, hypertension and CHF brought to the emergency room and code stroke status following onset of left hemiplegia. Patient indicated that he felt weakness and unsteady gait at 4 AM. Onset of left hemiplegia was at 8 AM. He was last known well at bedtime at 10 PM on 03/01/2016. CT scan of his head showed no acute intracranial abnormality. Unable to do MRI due to pacemaker. CT angiogram showed no large vessel occlusion. A high-grade right M3 stenosis was seen, however. High-grade stenosis of right vertebral V2 segment was noted. Abnormal 2 cm AP window lymphadenopathy, suspicious for malignancy was noted and follow-up CT of the chest with contrast is recommended    PT Comments    Pt progressing towards physical therapy goals. Was able to perform transfers to EOB and to standing with +2 assist however was able to achieve a better posture with HHA vs RW. Will continue to follow.   Follow Up Recommendations  CIR;Supervision/Assistance - 24 hour     Equipment Recommendations  Wheelchair (measurements PT);Wheelchair cushion (measurements PT)    Recommendations for Other Services Rehab consult     Precautions / Restrictions Precautions Precautions: Fall Precaution Comments: L hemiparesis Restrictions Weight Bearing Restrictions: No    Mobility  Bed Mobility Overal bed mobility: Needs Assistance Bed Mobility: Rolling;Sidelying to Sit;Sit to Supine Rolling: Mod assist Sidelying to sit: Mod assist;+2 for physical assistance   Sit to supine: Max assist;+2 for physical assistance   General bed mobility comments: Cues for technique and required assist for LLE and trunk elevation  Transfers Overall transfer level: Needs assistance Equipment used: 2 person hand held  assist Transfers: Sit to/from Stand Sit to Stand: Mod assist;+2 physical assistance         General transfer comment: Pt first stood with the RW for support. Increased assist required for upright posture and balance. [place] walker, pt was able to achieve a more erect standing position and tolerated static standing activity for ~1 minute.   Ambulation/Gait             General Gait Details: unsafe to attempt at this time. Pt was able to tolerate weight shifts in standing with +2 assist for balance support and blocking of L knee.    Stairs            Wheelchair Mobility    Modified Rankin (Stroke Patients Only)       Balance Overall balance assessment: Needs assistance Sitting-balance support: Single extremity supported Sitting balance-Leahy Scale: Poor Sitting balance - Comments: Worked on sitting EOB keeping midline static and dynamic balance Postural control: Posterior lean;Left lateral lean   Standing balance-Leahy Scale: Zero                      Cognition Arousal/Alertness: Awake/alert Behavior During Therapy: WFL for tasks assessed/performed Overall Cognitive Status: Impaired/Different from baseline Area of Impairment: Safety/judgement     Memory: Decreased short-term memory   Safety/Judgement: Decreased awareness of safety;Decreased awareness of deficits          Exercises      General Comments        Pertinent Vitals/Pain Pain Assessment: Faces Faces Pain Scale: Hurts little more Pain Location: back and neck Pain Descriptors / Indicators: Aching Pain Intervention(s): Limited activity within patient's tolerance;Monitored during session;Repositioned  Home Living                      Prior Function            PT Goals (current goals can now be found in the care plan section) Acute Rehab PT Goals Patient Stated Goal: Decrease back/neck pain PT Goal Formulation: With patient Time For Goal Achievement:  03/17/16 Potential to Achieve Goals: Good Progress towards PT goals: Progressing toward goals    Frequency  Min 4X/week    PT Plan Current plan remains appropriate    Co-evaluation             End of Session Equipment Utilized During Treatment: Gait belt Activity Tolerance: Patient limited by fatigue Patient left: in chair;with call bell/phone within reach     Time: 1326-1345 PT Time Calculation (min) (ACUTE ONLY): 19 min  Charges:  $Neuromuscular Re-education: 8-22 mins                    G Codes:      Rolinda Roan 03/20/2016, 3:36 PM   Rolinda Roan, PT, DPT Acute Rehabilitation Services Pager: [phone]

## 2016-03-06 NOTE — Progress Notes (Signed)
I met with pt at bedside and then contacted his son, Micheal Nicholson, by phone per his request to discuss options for his rehab. Micheal Nicholson states his older brother in the home can only provide supervision, no physical assist. No other caregiver support is available continuosly, only prn. Pt will need physical assist after a short rehab stay therefore SNF rehab is recommended to give longer recovery period before returning home. Their first preference is [facility] then [facility]. I have alerted RNCM, Kelli and SW, Sultan. We will sign off at this time. [phone]

## 2016-03-06 NOTE — Discharge Instructions (Signed)

## 2016-03-06 NOTE — Progress Notes (Signed)
Report given to nurse Ruthlin (sp?)at [facility]. All belongings sent with [facility]. Family called and updated. [facility] to transport to disposition.   Ave Filter, RN

## 2016-03-06 NOTE — Clinical Social Work Note (Signed)
Clinical Social Worker facilitated patient discharge including contacting patient family and facility to confirm patient discharge plans.  Clinical information faxed to facility and family agreeable with plan.  CSW arranged ambulance transport via [facility] to [facility] and [facility].  RN to call report prior to discharge.  Clinical Social Worker will sign off for now as social work intervention is no longer needed. Please consult [place] again if new need arises.  Glendon Axe, MSW, LCSWA [phone] 03/06/2016 4:16 PM

## 2016-03-07 DIAGNOSIS — I5022 Chronic systolic (congestive) heart failure: Secondary | ICD-10-CM | POA: Diagnosis not present

## 2016-03-07 DIAGNOSIS — I638 Other cerebral infarction: Secondary | ICD-10-CM | POA: Diagnosis not present

## 2016-03-07 DIAGNOSIS — I482 Chronic atrial fibrillation: Secondary | ICD-10-CM | POA: Diagnosis not present

## 2016-03-07 DIAGNOSIS — R590 Localized enlarged lymph nodes: Secondary | ICD-10-CM | POA: Diagnosis not present

## 2016-03-07 MED ORDER — IOHEXOL 300 MG/ML  SOLN
80.0000 mL | Freq: Once | INTRAMUSCULAR | Status: AC | PRN
  Administered 2016-03-03: 80 mL via INTRAVENOUS

## 2016-03-08 DIAGNOSIS — R1310 Dysphagia, unspecified: Secondary | ICD-10-CM | POA: Diagnosis not present

## 2016-03-08 DIAGNOSIS — I509 Heart failure, unspecified: Secondary | ICD-10-CM | POA: Diagnosis not present

## 2016-03-08 DIAGNOSIS — D696 Thrombocytopenia, unspecified: Secondary | ICD-10-CM | POA: Diagnosis not present

## 2016-03-08 DIAGNOSIS — K219 Gastro-esophageal reflux disease without esophagitis: Secondary | ICD-10-CM | POA: Diagnosis not present

## 2016-03-08 DIAGNOSIS — I639 Cerebral infarction, unspecified: Secondary | ICD-10-CM | POA: Diagnosis not present

## 2016-03-08 DIAGNOSIS — N400 Enlarged prostate without lower urinary tract symptoms: Secondary | ICD-10-CM | POA: Diagnosis not present

## 2016-03-08 DIAGNOSIS — I4891 Unspecified atrial fibrillation: Secondary | ICD-10-CM | POA: Diagnosis not present

## 2016-03-10 DIAGNOSIS — I5022 Chronic systolic (congestive) heart failure: Secondary | ICD-10-CM | POA: Diagnosis not present

## 2016-03-10 DIAGNOSIS — I10 Essential (primary) hypertension: Secondary | ICD-10-CM | POA: Diagnosis not present

## 2016-03-10 DIAGNOSIS — I482 Chronic atrial fibrillation: Secondary | ICD-10-CM | POA: Diagnosis not present

## 2016-03-10 DIAGNOSIS — I638 Other cerebral infarction: Secondary | ICD-10-CM | POA: Diagnosis not present

## 2016-03-13 DIAGNOSIS — I638 Other cerebral infarction: Secondary | ICD-10-CM | POA: Diagnosis not present

## 2016-03-13 DIAGNOSIS — I10 Essential (primary) hypertension: Secondary | ICD-10-CM | POA: Diagnosis not present

## 2016-03-13 DIAGNOSIS — I482 Chronic atrial fibrillation: Secondary | ICD-10-CM | POA: Diagnosis not present

## 2016-03-13 DIAGNOSIS — I5022 Chronic systolic (congestive) heart failure: Secondary | ICD-10-CM | POA: Diagnosis not present

## 2016-03-24 DIAGNOSIS — I638 Other cerebral infarction: Secondary | ICD-10-CM | POA: Diagnosis not present

## 2016-03-24 DIAGNOSIS — I5022 Chronic systolic (congestive) heart failure: Secondary | ICD-10-CM | POA: Diagnosis not present

## 2016-03-24 DIAGNOSIS — I482 Chronic atrial fibrillation: Secondary | ICD-10-CM | POA: Diagnosis not present

## 2016-03-24 DIAGNOSIS — R1011 Right upper quadrant pain: Secondary | ICD-10-CM | POA: Diagnosis not present

## 2016-03-28 DIAGNOSIS — M5416 Radiculopathy, lumbar region: Secondary | ICD-10-CM | POA: Diagnosis not present

## 2016-03-30 DIAGNOSIS — I5022 Chronic systolic (congestive) heart failure: Secondary | ICD-10-CM | POA: Diagnosis not present

## 2016-03-30 DIAGNOSIS — I638 Other cerebral infarction: Secondary | ICD-10-CM | POA: Diagnosis not present

## 2016-03-30 DIAGNOSIS — I10 Essential (primary) hypertension: Secondary | ICD-10-CM | POA: Diagnosis not present

## 2016-03-30 DIAGNOSIS — I482 Chronic atrial fibrillation: Secondary | ICD-10-CM | POA: Diagnosis not present

## 2016-03-31 DIAGNOSIS — I480 Paroxysmal atrial fibrillation: Secondary | ICD-10-CM | POA: Diagnosis not present

## 2016-03-31 DIAGNOSIS — I69354 Hemiplegia and hemiparesis following cerebral infarction affecting left non-dominant side: Secondary | ICD-10-CM | POA: Diagnosis not present

## 2016-03-31 DIAGNOSIS — G8190 Hemiplegia, unspecified affecting unspecified side: Secondary | ICD-10-CM | POA: Diagnosis not present

## 2016-03-31 DIAGNOSIS — I2510 Atherosclerotic heart disease of native coronary artery without angina pectoris: Secondary | ICD-10-CM | POA: Diagnosis not present

## 2016-03-31 DIAGNOSIS — M6281 Muscle weakness (generalized): Secondary | ICD-10-CM | POA: Diagnosis not present

## 2016-03-31 DIAGNOSIS — R278 Other lack of coordination: Secondary | ICD-10-CM | POA: Diagnosis not present

## 2016-03-31 DIAGNOSIS — I5043 Acute on chronic combined systolic (congestive) and diastolic (congestive) heart failure: Secondary | ICD-10-CM | POA: Diagnosis not present

## 2016-03-31 DIAGNOSIS — Z5181 Encounter for therapeutic drug level monitoring: Secondary | ICD-10-CM | POA: Diagnosis not present

## 2016-03-31 DIAGNOSIS — I69322 Dysarthria following cerebral infarction: Secondary | ICD-10-CM | POA: Diagnosis not present

## 2016-04-05 DIAGNOSIS — I2510 Atherosclerotic heart disease of native coronary artery without angina pectoris: Secondary | ICD-10-CM | POA: Diagnosis not present

## 2016-04-05 DIAGNOSIS — I69322 Dysarthria following cerebral infarction: Secondary | ICD-10-CM | POA: Diagnosis not present

## 2016-04-05 DIAGNOSIS — I69354 Hemiplegia and hemiparesis following cerebral infarction affecting left non-dominant side: Secondary | ICD-10-CM | POA: Diagnosis not present

## 2016-04-05 DIAGNOSIS — I110 Hypertensive heart disease with heart failure: Secondary | ICD-10-CM | POA: Diagnosis not present

## 2016-04-05 DIAGNOSIS — I480 Paroxysmal atrial fibrillation: Secondary | ICD-10-CM | POA: Diagnosis not present

## 2016-04-06 ENCOUNTER — Telehealth: Payer: Self-pay | Admitting: Family Medicine

## 2016-04-06 DIAGNOSIS — I6349 Cerebral infarction due to embolism of other cerebral artery: Secondary | ICD-10-CM

## 2016-04-06 NOTE — Telephone Encounter (Signed)
Physical therapy 1 x 1 week 4 x's 2 weeks.   Need order for social worker and RN and OT is evaluating today.

## 2016-04-06 NOTE — Telephone Encounter (Signed)
OK 

## 2016-04-07 DIAGNOSIS — I110 Hypertensive heart disease with heart failure: Secondary | ICD-10-CM | POA: Diagnosis not present

## 2016-04-07 DIAGNOSIS — I480 Paroxysmal atrial fibrillation: Secondary | ICD-10-CM | POA: Diagnosis not present

## 2016-04-07 DIAGNOSIS — I69354 Hemiplegia and hemiparesis following cerebral infarction affecting left non-dominant side: Secondary | ICD-10-CM | POA: Diagnosis not present

## 2016-04-07 DIAGNOSIS — I69322 Dysarthria following cerebral infarction: Secondary | ICD-10-CM | POA: Diagnosis not present

## 2016-04-07 DIAGNOSIS — I2510 Atherosclerotic heart disease of native coronary artery without angina pectoris: Secondary | ICD-10-CM | POA: Diagnosis not present

## 2016-04-10 ENCOUNTER — Ambulatory Visit (INDEPENDENT_AMBULATORY_CARE_PROVIDER_SITE_OTHER): Payer: Commercial Managed Care - HMO | Admitting: Family Medicine

## 2016-04-10 ENCOUNTER — Telehealth: Payer: Self-pay | Admitting: Family Medicine

## 2016-04-10 ENCOUNTER — Encounter: Payer: Self-pay | Admitting: Family Medicine

## 2016-04-10 VITALS — BP 120/82 | HR 63 | Ht 70.0 in | Wt 164.6 lb

## 2016-04-10 DIAGNOSIS — Z8673 Personal history of transient ischemic attack (TIA), and cerebral infarction without residual deficits: Secondary | ICD-10-CM

## 2016-04-10 DIAGNOSIS — I69322 Dysarthria following cerebral infarction: Secondary | ICD-10-CM | POA: Diagnosis not present

## 2016-04-10 DIAGNOSIS — I10 Essential (primary) hypertension: Secondary | ICD-10-CM

## 2016-04-10 DIAGNOSIS — I481 Persistent atrial fibrillation: Secondary | ICD-10-CM | POA: Diagnosis not present

## 2016-04-10 DIAGNOSIS — Z5181 Encounter for therapeutic drug level monitoring: Secondary | ICD-10-CM | POA: Diagnosis not present

## 2016-04-10 DIAGNOSIS — Z7901 Long term (current) use of anticoagulants: Secondary | ICD-10-CM

## 2016-04-10 DIAGNOSIS — I4819 Other persistent atrial fibrillation: Secondary | ICD-10-CM

## 2016-04-10 DIAGNOSIS — I110 Hypertensive heart disease with heart failure: Secondary | ICD-10-CM | POA: Diagnosis not present

## 2016-04-10 DIAGNOSIS — I480 Paroxysmal atrial fibrillation: Secondary | ICD-10-CM | POA: Diagnosis not present

## 2016-04-10 DIAGNOSIS — I69354 Hemiplegia and hemiparesis following cerebral infarction affecting left non-dominant side: Secondary | ICD-10-CM | POA: Diagnosis not present

## 2016-04-10 DIAGNOSIS — I5022 Chronic systolic (congestive) heart failure: Secondary | ICD-10-CM

## 2016-04-10 DIAGNOSIS — I2510 Atherosclerotic heart disease of native coronary artery without angina pectoris: Secondary | ICD-10-CM | POA: Diagnosis not present

## 2016-04-10 LAB — PROTIME-INR
INR: 1.5 ratio — AB (ref 0.8–1.0)
PROTHROMBIN TIME: 16.1 s — AB (ref 9.6–13.1)

## 2016-04-10 NOTE — Telephone Encounter (Signed)
Micheal Nicholson is aware of verbal orders. He is also requesting an order be sent to [facility] for a Hospital Bed. Okay?

## 2016-04-10 NOTE — Progress Notes (Signed)
Subjective:    Patient ID: Micheal Nicholson, male    DOB: 1920/09/25, 82 y.o.   MRN: [id number]  HPI Medical Follow-up  Patient's younger son has accompanied him at this visit.  Patient seen today for medical follow-up post hospitalization for acute right embolic stroke [id number].  Patient was discharged to inpatient rehabilitation facility for 21 days and has since been discharged home for about two weeks.  Patient is home with an older son who has health issues. Patient reports multiple falls within the last few days. Patient reports falling on left side during one fall and has since experienced left sided rib pain. He denies hitting head.  He denies pain or soreness to any other area. He reports fatigued and slowed motor responses when attempting tasks. Patient and son feel that home health would be beneficial in providing activities of daily living.  Currently has home PT and OT in place.  #2 Lymphadenopathy   During 03/02/16 admission a CT scan of chest revealed nonspecific lymph nodes present chest and right kidney. Son and patient were advised of these findings prior to hospital discharge and are more concerned with his current rehabilitation from stroke versus obtaining a further work-up regarding these findings.  Past Medical History  Diagnosis Date  . Hypertension   . Anxiety   . BPH (benign prostatic hyperplasia)   . Chronic combined systolic and diastolic CHF, NYHA class 2 ([place]) 04/17/2015  . CHF (congestive heart failure) ([place])   . Symptomatic sinus bradycardia     PPM Dr. Rayann Heman, 01/13/16, STJ device  . GERD (gastroesophageal reflux disease)   . Arthritis    Past Surgical History  Procedure Laterality Date  . Cardiac catheterization  2006    Negative / 4 years ago  . Ep implantable device N/A 01/13/2016    Procedure: Pacemaker Implant;  Surgeon: Thompson Grayer, MD;  Location: [facility];  Service: Cardiovascular;  Laterality: N/A;    reports that he has quit  smoking. He has never used smokeless tobacco. He reports that he drinks alcohol. He reports that he does not use illicit drugs. family history includes Healthy in his son; Heart disease in his brother; Heart failure in his son; Lung cancer in his brother; Pancreatic cancer in his son; Pneumonia (age of onset: 85) in his father. Allergies  Allergen Reactions  . Chocolate Other (See Comments)    Acid reflux      Review of Systems  Constitutional: Positive for fatigue.  HENT: Negative.   Eyes: Negative.   Respiratory: Negative.   Cardiovascular: Negative.   Neurological: Positive for weakness and numbness.       Generalized weakness with gait instability. Increased left sided weakness arm and leg.   Psychiatric/Behavioral: Negative.       Objective:   Physical Exam  Constitutional: He is oriented to person, place, and time. He appears well-developed and well-nourished.  HENT:  Head: Normocephalic and atraumatic.  Right Ear: External ear normal.  Left Ear: External ear normal.  Mouth/Throat: Oropharynx is clear and moist.  Cardiovascular: Normal rate, regular rhythm and normal heart sounds.   Pacemaker placed February 2017. Rate and rhythm today regular.  Pulmonary/Chest: Effort normal and breath sounds normal.  Musculoskeletal: He exhibits edema.  Gait unsteady. Uses walker for ambulation.   Neurological: He is alert and oriented to person, place, and time.  Hand grips moderate right and weakness left.   Skin: Skin is warm and dry.  Psychiatric: He has a normal  mood and affect. His behavior is normal. Thought content normal.          Assessment & Plan:  #1. Stroke History: Patient seen today for medical follow-up post hospitalization for right sided embolic stroke [id number]. He has some generalized weakness, unstable gait, ambulates with walker, and left sided weakness. He is followed by cardiology, neurology, and is currently receiving occupational and physical therapy at  home.  He is currently taking Coumadin for anticoagulation. Last INR checked 03/06/16 and 1.40. Goal INR 2.5-3.  BP and lipids controlled.  No hx of diabetes.   Plan: Check Pro time INR today. Referral for home health services to assist with activities of daily living.  #2. Lymphadenopathy chest and right kidney:  Incidental finding from chest CT obtained during 03/02/16 hospital stay, revealed non-specific lymphadenopathy chest, enlarged spleen, and lesion on right kidney. Patient is currently recovering from recent stroke and is presently not stable enough to undergo invasive procedure to identify etiology of lymphadenopathy.  Plan:  Consider CT in 6-12 months to evaluate chest, spleen, and right kidney-IF medically improved/stable at that time. Patient and son agree that he is not strong enough to undergo any type of invasive evaluation or further aggressive treatment at this time- even if he did have Lymphoma.

## 2016-04-10 NOTE — Telephone Encounter (Signed)
OK 

## 2016-04-10 NOTE — Telephone Encounter (Signed)
OK for speech therapy.

## 2016-04-10 NOTE — Telephone Encounter (Signed)
Nira Conn / Micheal Nicholson called to request verbal orders for pt to have speech therapy.  Also want Dr Elease Hashimoto to know pt had another fall. Denies any injury, except hurts when he takes a deep breath.

## 2016-04-10 NOTE — Telephone Encounter (Signed)
Order entered for Hospital Bed.

## 2016-04-10 NOTE — Progress Notes (Signed)
Pre visit review using our clinic review tool, if applicable. No additional management support is needed unless otherwise documented below in the visit note. 

## 2016-04-11 ENCOUNTER — Telehealth: Payer: Self-pay | Admitting: Family Medicine

## 2016-04-11 NOTE — Telephone Encounter (Signed)
Nira Conn is aware that it is fine for speech therapy orders.

## 2016-04-11 NOTE — Telephone Encounter (Signed)
Pt need refill on Rx Coreg 6.350mg  (tablets), Neurontin 200 mg x2 a day (caps), Dutasteride-tamsulosin 0.5-0.4 mg (caps) and Coumadin .5mg .  Pharm:  [facility]

## 2016-04-12 ENCOUNTER — Telehealth: Payer: Self-pay | Admitting: Family Medicine

## 2016-04-12 DIAGNOSIS — I69322 Dysarthria following cerebral infarction: Secondary | ICD-10-CM | POA: Diagnosis not present

## 2016-04-12 DIAGNOSIS — I69354 Hemiplegia and hemiparesis following cerebral infarction affecting left non-dominant side: Secondary | ICD-10-CM | POA: Diagnosis not present

## 2016-04-12 DIAGNOSIS — I480 Paroxysmal atrial fibrillation: Secondary | ICD-10-CM | POA: Diagnosis not present

## 2016-04-12 DIAGNOSIS — I110 Hypertensive heart disease with heart failure: Secondary | ICD-10-CM | POA: Diagnosis not present

## 2016-04-12 DIAGNOSIS — I2510 Atherosclerotic heart disease of native coronary artery without angina pectoris: Secondary | ICD-10-CM | POA: Diagnosis not present

## 2016-04-12 MED ORDER — GABAPENTIN 100 MG PO CAPS: 200.0000 mg | ORAL_CAPSULE | Freq: Three times a day (TID) | ORAL | Status: DC

## 2016-04-12 MED ORDER — CARVEDILOL 3.125 MG PO TABS: 3.1250 mg | ORAL_TABLET | Freq: Two times a day (BID) | ORAL | Status: DC

## 2016-04-12 MED ORDER — DUTASTERIDE-TAMSULOSIN HCL 0.5-0.4 MG PO CAPS: 1.0000 | ORAL_CAPSULE | Freq: Every day | ORAL | Status: DC

## 2016-04-12 NOTE — Telephone Encounter (Signed)
Wound care as per note OK.  Social worker OK.  Would probably be best if they can check INR- as long as there is good/reliable communication and oversight of results.

## 2016-04-12 NOTE — Telephone Encounter (Signed)
[facility] See other message about coumadin.

## 2016-04-12 NOTE — Telephone Encounter (Signed)
Heather with [facility] would like to know if we will be checking pt's coum, or do you want them to do?  [facility] would like an order for a Education officer, museum from their office to connect with community resources.   Also, pt has a small wound on his left elbow. She is thinking wash, neosporin and then cover. Is that OK?

## 2016-04-12 NOTE — Telephone Encounter (Signed)
LM with Heather to call back.

## 2016-04-12 NOTE — Telephone Encounter (Signed)
Verbal orders given to [facility]. Looks like INR was checked on 04/10/16, review and advise the next time it needs to be checked so I can give this date to [facility]. Thank you.

## 2016-04-12 NOTE — Telephone Encounter (Signed)
Please advise on orders

## 2016-04-12 NOTE — Telephone Encounter (Signed)
Nira Conn would like to know if we will be checking coum, ot=r do you want them to do?  [place] wou

## 2016-04-13 ENCOUNTER — Other Ambulatory Visit: Payer: Self-pay | Admitting: Family Medicine

## 2016-04-13 DIAGNOSIS — I69354 Hemiplegia and hemiparesis following cerebral infarction affecting left non-dominant side: Secondary | ICD-10-CM | POA: Diagnosis not present

## 2016-04-13 DIAGNOSIS — I480 Paroxysmal atrial fibrillation: Secondary | ICD-10-CM | POA: Diagnosis not present

## 2016-04-13 DIAGNOSIS — I2510 Atherosclerotic heart disease of native coronary artery without angina pectoris: Secondary | ICD-10-CM | POA: Diagnosis not present

## 2016-04-13 DIAGNOSIS — I110 Hypertensive heart disease with heart failure: Secondary | ICD-10-CM | POA: Diagnosis not present

## 2016-04-13 DIAGNOSIS — I69322 Dysarthria following cerebral infarction: Secondary | ICD-10-CM | POA: Diagnosis not present

## 2016-04-13 NOTE — Telephone Encounter (Signed)
I have entered the order, [facility], is there anything else I need to do? Was that the correct thing to do? If you are not sure let me know and I will ask Apolonio Schneiders. Thanks!

## 2016-04-13 NOTE — Telephone Encounter (Signed)
Pt son is to ask if the order for the hospital bed has been placed per his conversation with Dr Elease Hashimoto.

## 2016-04-14 DIAGNOSIS — I480 Paroxysmal atrial fibrillation: Secondary | ICD-10-CM | POA: Diagnosis not present

## 2016-04-14 DIAGNOSIS — I2510 Atherosclerotic heart disease of native coronary artery without angina pectoris: Secondary | ICD-10-CM | POA: Diagnosis not present

## 2016-04-14 DIAGNOSIS — I69322 Dysarthria following cerebral infarction: Secondary | ICD-10-CM | POA: Diagnosis not present

## 2016-04-14 DIAGNOSIS — I110 Hypertensive heart disease with heart failure: Secondary | ICD-10-CM | POA: Diagnosis not present

## 2016-04-14 DIAGNOSIS — I69354 Hemiplegia and hemiparesis following cerebral infarction affecting left non-dominant side: Secondary | ICD-10-CM | POA: Diagnosis not present

## 2016-04-15 ENCOUNTER — Other Ambulatory Visit: Payer: Self-pay | Admitting: Family Medicine

## 2016-04-16 ENCOUNTER — Other Ambulatory Visit: Payer: Self-pay | Admitting: Cardiovascular Disease

## 2016-04-17 DIAGNOSIS — I2510 Atherosclerotic heart disease of native coronary artery without angina pectoris: Secondary | ICD-10-CM | POA: Diagnosis not present

## 2016-04-17 DIAGNOSIS — I480 Paroxysmal atrial fibrillation: Secondary | ICD-10-CM | POA: Diagnosis not present

## 2016-04-17 DIAGNOSIS — I110 Hypertensive heart disease with heart failure: Secondary | ICD-10-CM | POA: Diagnosis not present

## 2016-04-17 DIAGNOSIS — I69354 Hemiplegia and hemiparesis following cerebral infarction affecting left non-dominant side: Secondary | ICD-10-CM | POA: Diagnosis not present

## 2016-04-17 DIAGNOSIS — I69322 Dysarthria following cerebral infarction: Secondary | ICD-10-CM | POA: Diagnosis not present

## 2016-04-17 NOTE — Telephone Encounter (Signed)
#  1  Send in rx for Coumadin at current dose as per med list #2  Repeat INR per home health within next 2 days and get those results to [place].

## 2016-04-17 NOTE — Telephone Encounter (Signed)
Pts INR was checked on 04/10/16, new rx was not sent in. Please advise on RX and if pts coumadin needs to be rechecked soon. I need to let home health know either way.

## 2016-04-17 NOTE — Telephone Encounter (Signed)
Rx(s) sent to pharmacy electronically.  

## 2016-04-17 NOTE — Telephone Encounter (Addendum)
Daughter called to ask if pt should have another rx for his coum?  It was checked in our office 4/17, but no new rx sent in. Micheal Nicholson is going to be checking per Dr Elease Hashimoto notes. But pt is almost out and does not know what to do about the current results. Please advise

## 2016-04-18 DIAGNOSIS — I110 Hypertensive heart disease with heart failure: Secondary | ICD-10-CM | POA: Diagnosis not present

## 2016-04-18 DIAGNOSIS — I480 Paroxysmal atrial fibrillation: Secondary | ICD-10-CM | POA: Diagnosis not present

## 2016-04-18 DIAGNOSIS — I69354 Hemiplegia and hemiparesis following cerebral infarction affecting left non-dominant side: Secondary | ICD-10-CM | POA: Diagnosis not present

## 2016-04-18 DIAGNOSIS — I2510 Atherosclerotic heart disease of native coronary artery without angina pectoris: Secondary | ICD-10-CM | POA: Diagnosis not present

## 2016-04-18 DIAGNOSIS — I69322 Dysarthria following cerebral infarction: Secondary | ICD-10-CM | POA: Diagnosis not present

## 2016-04-18 NOTE — Telephone Encounter (Signed)
Daughter is aware that medication has been sent to the pharmacy. Will try to call Heather back today to have her recheck INR.

## 2016-04-18 NOTE — Telephone Encounter (Signed)
Nira Conn is aware to check INR on 04/20/16. She will report results back.

## 2016-04-19 ENCOUNTER — Ambulatory Visit (INDEPENDENT_AMBULATORY_CARE_PROVIDER_SITE_OTHER): Payer: Commercial Managed Care - HMO | Admitting: General Practice

## 2016-04-19 ENCOUNTER — Other Ambulatory Visit: Payer: Self-pay | Admitting: General Practice

## 2016-04-19 ENCOUNTER — Telehealth: Payer: Self-pay | Admitting: Family Medicine

## 2016-04-19 DIAGNOSIS — I69354 Hemiplegia and hemiparesis following cerebral infarction affecting left non-dominant side: Secondary | ICD-10-CM | POA: Diagnosis not present

## 2016-04-19 DIAGNOSIS — Z7901 Long term (current) use of anticoagulants: Secondary | ICD-10-CM | POA: Diagnosis not present

## 2016-04-19 DIAGNOSIS — I480 Paroxysmal atrial fibrillation: Secondary | ICD-10-CM

## 2016-04-19 DIAGNOSIS — I2510 Atherosclerotic heart disease of native coronary artery without angina pectoris: Secondary | ICD-10-CM | POA: Diagnosis not present

## 2016-04-19 DIAGNOSIS — I69322 Dysarthria following cerebral infarction: Secondary | ICD-10-CM | POA: Diagnosis not present

## 2016-04-19 DIAGNOSIS — Z5181 Encounter for therapeutic drug level monitoring: Secondary | ICD-10-CM | POA: Insufficient documentation

## 2016-04-19 DIAGNOSIS — I110 Hypertensive heart disease with heart failure: Secondary | ICD-10-CM | POA: Diagnosis not present

## 2016-04-19 LAB — PROTIME-INR: INR: 2.4 — AB (ref <=1.1)

## 2016-04-19 MED ORDER — WARFARIN SODIUM 5 MG PO TABS: ORAL_TABLET | ORAL | Status: DC

## 2016-04-19 NOTE — Progress Notes (Signed)
Pre visit review using our clinic review tool, if applicable. No additional management support is needed unless otherwise documented below in the visit note. 

## 2016-04-19 NOTE — Telephone Encounter (Signed)
Val with [facility] would like verbal to extend pt's OT for  2 wk X 2  Please call with verbal

## 2016-04-19 NOTE — Telephone Encounter (Signed)
Verbal orders given to OT.

## 2016-04-19 NOTE — Telephone Encounter (Signed)
OK 

## 2016-04-21 ENCOUNTER — Ambulatory Visit (INDEPENDENT_AMBULATORY_CARE_PROVIDER_SITE_OTHER): Payer: Commercial Managed Care - HMO | Admitting: Family Medicine

## 2016-04-21 VITALS — BP 110/80 | HR 60 | Ht 70.0 in | Wt 172.9 lb

## 2016-04-21 DIAGNOSIS — I6340 Cerebral infarction due to embolism of unspecified cerebral artery: Secondary | ICD-10-CM

## 2016-04-21 DIAGNOSIS — I69322 Dysarthria following cerebral infarction: Secondary | ICD-10-CM | POA: Diagnosis not present

## 2016-04-21 DIAGNOSIS — F5104 Psychophysiologic insomnia: Secondary | ICD-10-CM

## 2016-04-21 DIAGNOSIS — I10 Essential (primary) hypertension: Secondary | ICD-10-CM | POA: Diagnosis not present

## 2016-04-21 DIAGNOSIS — G4700 Insomnia, unspecified: Secondary | ICD-10-CM | POA: Diagnosis not present

## 2016-04-21 DIAGNOSIS — F419 Anxiety disorder, unspecified: Secondary | ICD-10-CM

## 2016-04-21 DIAGNOSIS — I480 Paroxysmal atrial fibrillation: Secondary | ICD-10-CM | POA: Diagnosis not present

## 2016-04-21 DIAGNOSIS — I2510 Atherosclerotic heart disease of native coronary artery without angina pectoris: Secondary | ICD-10-CM | POA: Diagnosis not present

## 2016-04-21 DIAGNOSIS — I69354 Hemiplegia and hemiparesis following cerebral infarction affecting left non-dominant side: Secondary | ICD-10-CM | POA: Diagnosis not present

## 2016-04-21 DIAGNOSIS — I110 Hypertensive heart disease with heart failure: Secondary | ICD-10-CM | POA: Diagnosis not present

## 2016-04-21 NOTE — Progress Notes (Signed)
Pre visit review using our clinic review tool, if applicable. No additional management support is needed unless otherwise documented below in the visit note. 

## 2016-04-21 NOTE — Telephone Encounter (Signed)
Cindy received INR results on patient and managed.

## 2016-04-21 NOTE — Progress Notes (Signed)
Subjective:    Patient ID: Micheal Nicholson, male    DOB: 06/14/1920, 82 y.o.   MRN: [id number]  HPI Patient here accompanied by son with insomnia and anxiety issues. He's had anxiety and intermittent insomnia for much of his life. Currently takes sertraline 150 mg daily and Xanax 0.5 mg twice a day. He is having significant difficulty sleeping at night and also frequent daytime anxiety symptoms. We tried diligently in the past not increasing his benzodiazepine because of fall risk. He is also on Coumadin with recent probable embolic stroke. Ambulates with a walker at all times. No falls since last visit Does take daytime naps occasionally. No caffeine use and no alcohol use  He is on Coumadin with INR 2 days ago 2.4. Home health in place for physical therapy. Denies any new neurologic symptoms. No chest pains.  All medications reviewed. Blood pressure stable.  Past Medical History  Diagnosis Date  . Hypertension   . Anxiety   . BPH (benign prostatic hyperplasia)   . Chronic combined systolic and diastolic CHF, NYHA class 2 ([place]) 04/17/2015  . CHF (congestive heart failure) ([place])   . Symptomatic sinus bradycardia     PPM Dr. Rayann Heman, 01/13/16, STJ device  . GERD (gastroesophageal reflux disease)   . Arthritis    Past Surgical History  Procedure Laterality Date  . Cardiac catheterization  2006    Negative / 4 years ago  . Ep implantable device N/A 01/13/2016    Procedure: Pacemaker Implant;  Surgeon: Thompson Grayer, MD;  Location: [facility];  Service: Cardiovascular;  Laterality: N/A;    reports that he has quit smoking. He has never used smokeless tobacco. He reports that he drinks alcohol. He reports that he does not use illicit drugs. family history includes Healthy in his son; Heart disease in his brother; Heart failure in his son; Lung cancer in his brother; Pancreatic cancer in his son; Pneumonia (age of onset: 54) in his father. Allergies  Allergen Reactions    . Chocolate Other (See Comments)    Acid reflux      Review of Systems  Constitutional: Positive for fatigue.  Eyes: Negative for visual disturbance.  Respiratory: Negative for cough, chest tightness and shortness of breath.   Cardiovascular: Negative for chest pain, palpitations and leg swelling.  Genitourinary: Negative for dysuria.  Neurological: Negative for dizziness, syncope, light-headedness and headaches.  Psychiatric/Behavioral: Positive for sleep disturbance. Negative for dysphoric mood and agitation. The patient is nervous/anxious.        Objective:   Physical Exam  Constitutional: He is oriented to person, place, and time. He appears well-developed and well-nourished.  Neck: Neck supple.  Cardiovascular: Normal rate.   Pulmonary/Chest: Effort normal and breath sounds normal. No respiratory distress. He has no wheezes. He has no rales.  Musculoskeletal: He exhibits no edema.  Neurological: He is alert and oriented to person, place, and time. No cranial nerve deficit.  Psychiatric: He has a normal mood and affect. His behavior is normal.          Assessment & Plan:  #1 chronic insomnia. Sleep hygiene discussed. Avoid daytime napping. Avoid late day use of caffeine. Handout given.  #2 chronic anxiety. Had a very long discussion with patient and his son regarding benzodiazepine risk. He remains on sertraline. They're aware of increased risk of falls with benzodiazepine use at his age and especially with risk on Coumadin. However, his anxieties been very difficult to manage with several other medications  the past. Continue alprazolam 0.5 mg twice a day and may supplement with extra tablet at night as needed for severe insomnia.  #3 hypertension stable and at goal  #4 recent stroke. Patient has chronic atrial fibrillation on Coumadin. Recent INR therapeutic  [name] MD [facility] at [facility]

## 2016-04-21 NOTE — Patient Instructions (Signed)
Insomnia Insomnia is a sleep disorder that makes it difficult to fall asleep or to stay asleep. Insomnia can cause tiredness (fatigue), low energy, difficulty concentrating, mood swings, and poor performance at work or school.  There are three different ways to classify insomnia:  Difficulty falling asleep.  Difficulty staying asleep.  Waking up too early in the morning. Any type of insomnia can be long-term (chronic) or short-term (acute). Both are common. Short-term insomnia usually lasts for three months or less. Chronic insomnia occurs at least three times a week for longer than three months. CAUSES  Insomnia may be caused by another condition, situation, or substance, such as:  Anxiety.  Certain medicines.  Gastroesophageal reflux disease (GERD) or other gastrointestinal conditions.  Asthma or other breathing conditions.  Restless legs syndrome, sleep apnea, or other sleep disorders.  Chronic pain.  Menopause. This may include hot flashes.  Stroke.  Abuse of alcohol, tobacco, or illegal drugs.  Depression.  Caffeine.   Neurological disorders, such as Alzheimer disease.  An overactive thyroid (hyperthyroidism). The cause of insomnia may not be known. RISK FACTORS Risk factors for insomnia include:  Gender. Women are more commonly affected than men.  Age. Insomnia is more common as you get older.  Stress. This may involve your professional or personal life.  Income. Insomnia is more common in people with lower income.  Lack of exercise.   Irregular work schedule or night shifts.  Traveling between different time zones. SIGNS AND SYMPTOMS If you have insomnia, trouble falling asleep or trouble staying asleep is the main symptom. This may lead to other symptoms, such as:  Feeling fatigued.  Feeling nervous about going to sleep.  Not feeling rested in the morning.  Having trouble concentrating.  Feeling irritable, anxious, or depressed. TREATMENT   Treatment for insomnia depends on the cause. If your insomnia is caused by an underlying condition, treatment will focus on addressing the condition. Treatment may also include:   Medicines to help you sleep.  Counseling or therapy.  Lifestyle adjustments. HOME CARE INSTRUCTIONS   Take medicines only as directed by your health care provider.  Keep regular sleeping and waking hours. Avoid naps.  Keep a sleep diary to help you and your health care provider figure out what could be causing your insomnia. Include:   When you sleep.  When you wake up during the night.  How well you sleep.   How rested you feel the next day.  Any side effects of medicines you are taking.  What you eat and drink.   Make your bedroom a comfortable place where it is easy to fall asleep:  Put up shades or special blackout curtains to block light from outside.  Use a white noise machine to block noise.  Keep the temperature cool.   Exercise regularly as directed by your health care provider. Avoid exercising right before bedtime.  Use relaxation techniques to manage stress. Ask your health care provider to suggest some techniques that may work well for you. These may include:  Breathing exercises.  Routines to release muscle tension.  Visualizing peaceful scenes.  Cut back on alcohol, caffeinated beverages, and cigarettes, especially close to bedtime. These can disrupt your sleep.  Do not overeat or eat spicy foods right before bedtime. This can lead to digestive discomfort that can make it hard for you to sleep.  Limit screen use before bedtime. This includes:  Watching TV.  Using your smartphone, tablet, and computer.  Stick to a routine. This   can help you fall asleep faster. Try to do a quiet activity, brush your teeth, and go to bed at the same time each night.  Get out of bed if you are still awake after 15 minutes of trying to sleep. Keep the lights down, but try reading or  doing a quiet activity. When you feel sleepy, go back to bed.  Make sure that you drive carefully. Avoid driving if you feel very sleepy.  Keep all follow-up appointments as directed by your health care provider. This is important. SEEK MEDICAL CARE IF:   You are tired throughout the day or have trouble in your daily routine due to sleepiness.  You continue to have sleep problems or your sleep problems get worse. SEEK IMMEDIATE MEDICAL CARE IF:   You have serious thoughts about hurting yourself or someone else.   This information is not intended to replace advice given to you by your health care provider. Make sure you discuss any questions you have with your health care provider.   Document Released: 12/08/2000 Document Revised: 09/01/2015 Document Reviewed: 09/11/2014 Elsevier Interactive Patient Education 2016 [employer]  

## 2016-04-24 ENCOUNTER — Telehealth: Payer: Self-pay

## 2016-04-24 NOTE — Addendum Note (Signed)
Addended by: Elio Forget on: 04/24/2016 02:59 PM   Modules accepted: Orders

## 2016-04-24 NOTE — Telephone Encounter (Signed)
Pt is still waiting [facility] bed [facility]

## 2016-04-24 NOTE — Telephone Encounter (Signed)
Pt's son is aware that that Micheal Nicholson will be faxing over form to complete.

## 2016-04-25 ENCOUNTER — Encounter: Payer: Self-pay | Admitting: Cardiovascular Disease

## 2016-04-25 ENCOUNTER — Ambulatory Visit (INDEPENDENT_AMBULATORY_CARE_PROVIDER_SITE_OTHER): Payer: Commercial Managed Care - HMO | Admitting: Cardiovascular Disease

## 2016-04-25 VITALS — BP 134/82 | HR 60 | Ht 70.0 in | Wt 170.2 lb

## 2016-04-25 DIAGNOSIS — I69322 Dysarthria following cerebral infarction: Secondary | ICD-10-CM | POA: Diagnosis not present

## 2016-04-25 DIAGNOSIS — I10 Essential (primary) hypertension: Secondary | ICD-10-CM | POA: Diagnosis not present

## 2016-04-25 DIAGNOSIS — I480 Paroxysmal atrial fibrillation: Secondary | ICD-10-CM | POA: Diagnosis not present

## 2016-04-25 DIAGNOSIS — I4891 Unspecified atrial fibrillation: Secondary | ICD-10-CM

## 2016-04-25 DIAGNOSIS — I110 Hypertensive heart disease with heart failure: Secondary | ICD-10-CM | POA: Diagnosis not present

## 2016-04-25 DIAGNOSIS — I441 Atrioventricular block, second degree: Secondary | ICD-10-CM | POA: Diagnosis not present

## 2016-04-25 DIAGNOSIS — Z7901 Long term (current) use of anticoagulants: Secondary | ICD-10-CM

## 2016-04-25 DIAGNOSIS — I69354 Hemiplegia and hemiparesis following cerebral infarction affecting left non-dominant side: Secondary | ICD-10-CM | POA: Diagnosis not present

## 2016-04-25 DIAGNOSIS — Z79899 Other long term (current) drug therapy: Secondary | ICD-10-CM

## 2016-04-25 DIAGNOSIS — I2510 Atherosclerotic heart disease of native coronary artery without angina pectoris: Secondary | ICD-10-CM | POA: Diagnosis not present

## 2016-04-25 DIAGNOSIS — I5022 Chronic systolic (congestive) heart failure: Secondary | ICD-10-CM

## 2016-04-25 DIAGNOSIS — G8194 Hemiplegia, unspecified affecting left nondominant side: Secondary | ICD-10-CM | POA: Insufficient documentation

## 2016-04-25 MED ORDER — LISINOPRIL 10 MG PO TABS: 10.0000 mg | ORAL_TABLET | Freq: Every day | ORAL | Status: DC

## 2016-04-25 NOTE — Patient Instructions (Signed)
Medication Instructions:  Your physician has recommended you make the following change in your medication:  1- INCREASE lisinopril to 10 mg by mouth daily.   Labwork: Your physician recommends that you return for lab work in: 1 week, on or around May 02, 2016. You may go to Dr Burchette's office to have blood drawn. You will need to call their office to be put on lab schedule before you come.   Testing/Procedures: NONE  Follow-Up: Your physician recommends that you schedule a follow-up appointment in: 6 weeks with Dr [name].   Any Other Special Instructions Will Be Listed Below (If Applicable).     If you need a refill on your cardiac medications before your next appointment, please call your pharmacy.

## 2016-04-25 NOTE — Telephone Encounter (Signed)
I have faxed form back to [facility].

## 2016-04-25 NOTE — Progress Notes (Signed)
Patient ID: Micheal Nicholson, male   DOB: July 15, 1920, 82 y.o.   MRN: [id number]    Cardiology Office Note    Date:  04/25/2016   ID:  Micheal Nicholson, Micheal Nicholson 1920-08-19, MRN ID:[id number]  PCP:  [name], MD  Cardiologist:   Sanda Klein, MD   Chief Complaint  Patient presents with  . Annual Exam    patient has a "light stroke" about 6 weeka GO.    History of Present Illness:  Micheal Nicholson is a 82 y.o. male who presents for follow-up not long after hospitalization for acute stroke. He presented on March 9 with left-sided hemiparesis. He continues to have left upper extremity paresis and seems to have receptive aphasia. He was taking Eliquis at the time of his hospitalization for stroke. The decision was made to switch to treatment with warfarin (I think this was partially justified by his renal function, in part may be due to some concerns about compliance with the direct oral anticoagulant).  Micheal Nicholson was on anticoagulant therapy for long-term persistent atrial fibrillation. This was diagnosis and asymptomatic arrhythmia 2015. At that time there was evidence of an old asymptomatic stroke by brain MRI.  He has a long-standing history of conduction abnormalities including trifascicular block and symptomatic sinus bradycardia for which she received a [employer] single-chamber permanent pacemaker in January 2017 (Dr. Rayann Heman).  Prior to that he had cardiomyopathy of unknown etiology. He had an ejection fraction of around 45% in 2006 and only had some minor stenoses in the proximal to mid LAD artery. In 2013 his ejection fraction was described also at 45% with inferior wall hypokinesis and mild right ventricular dilatation. In 2015, when atrial fibrillation was diagnosed, his echo showed an ejection fraction of 30-35%. During this recent hospitalization for stroke his ejection fraction was estimated to be 25-30 %. His most recent nuclear stress test showed a small apical  inferolateral fixed defect without ischemia, much too small to explain his cardiomyopathy.  Pacemaker interrogation shows 88% ventricular pacing with a generator that is still at beginning of life and excellent lead parameters.  He is trying to become more active. He is participating in physical therapy. He uses a walker, but over the weekend actually enjoyed riding on his lawnmower (he did require assistance to get back down). He has not had any falls and has not had any bleeding problems. He denies exertional dyspnea and has never complained of angina pectoralis. He does have bilateral ankle swelling, worse than at previous visits. However, his weight is essentially unchanged from January 2017.  Past Medical History  Diagnosis Date  . Hypertension   . Anxiety   . BPH (benign prostatic hyperplasia)   . Chronic combined systolic and diastolic CHF, NYHA class 2 ([place]) 04/17/2015  . CHF (congestive heart failure) ([place])   . Symptomatic sinus bradycardia     PPM Dr. Rayann Heman, 01/13/16, STJ device  . GERD (gastroesophageal reflux disease)   . Arthritis     Past Surgical History  Procedure Laterality Date  . Cardiac catheterization  2006    Negative / 4 years ago  . Ep implantable device N/A 01/13/2016    Procedure: Pacemaker Implant;  Surgeon: Thompson Grayer, MD;  Location: [facility];  Service: Cardiovascular;  Laterality: N/A;    Current Medications: Outpatient Prescriptions Prior to Visit  Medication Sig Dispense Refill  . ALPRAZolam (XANAX) 0.5 MG tablet Take 0.5 mg by mouth 3 (three) times daily as needed for anxiety.    Marland Kitchen  atorvastatin (LIPITOR) 10 MG tablet Take 0.5 tablets (5 mg total) by mouth daily at 6 PM. 30 tablet 0  . Camphor-Eucalyptus-Menthol (VICKS VAPORUB EX) Apply 1 application topically daily as needed (to help breathe).     . carvedilol (COREG) 3.125 MG tablet Take 1 tablet (3.125 mg total) by mouth 2 (two) times daily with a meal. 180 tablet 2  .  Dutasteride-Tamsulosin HCl 0.5-0.4 MG CAPS Take 1 capsule by mouth daily. 90 capsule 3  . fluticasone (FLONASE) 50 MCG/ACT nasal spray Place 2 sprays into both nostrils 2 (two) times daily.     . furosemide (LASIX) 20 MG tablet TAKE 1 TALBET BY MOUTH DAILY ON MONDAYS, WEDNESDAYS, AND FRIDAYS 90 tablet 2  . gabapentin (NEURONTIN) 100 MG capsule Take 2 capsules (200 mg total) by mouth 3 (three) times daily. 270 capsule 1  . guaiFENesin (MUCINEX) 600 MG 12 hr tablet Take 1 tablet (600 mg total) by mouth 2 (two) times daily. 30 tablet 0  . KLOR-CON M10 10 MEQ tablet Take 1 tablet by mouth daily.    Marland Kitchen omeprazole (PRILOSEC) 20 MG capsule TAKE ONE CAPSULE BY MOUTH EVERY DAY 30 capsule 5  . polyethylene glycol (MIRALAX / GLYCOLAX) packet Take 17 g by mouth daily. 14 each 0  . pramipexole (MIRAPEX) 0.125 MG tablet TAKE 1 TABLET BY MOUTH AT BEDTIME 30 tablet 5  . senna-docusate (SENOKOT-S) 8.6-50 MG tablet Take 1 tablet by mouth at bedtime as needed for mild constipation. 10 tablet 0  . sertraline (ZOLOFT) 100 MG tablet TAKE 1.5 TABLETS BY MOUTH DAILY (Patient taking differently: TAKE 1.5 TABLETS (150mg ) BY MOUTH DAILY) 45 tablet 3  . Vitamin D, Ergocalciferol, (DRISDOL) 50000 UNITS CAPS capsule Take 1 capsule (50,000 Units total) by mouth every 7 (seven) days. 30 capsule 0  . warfarin (COUMADIN) 5 MG tablet Take as directed by anticoagulation clinic. 35 tablet 3  . lisinopril (PRINIVIL,ZESTRIL) 5 MG tablet TAKE 1 TABLET BY MOUTH EVERY DAY 30 tablet 11  . enoxaparin (LOVENOX) 60 MG/0.6ML injection Inject 0.9 mLs (90 mg total) into the skin every 12 (twelve) hours. 10 mL 0   No facility-administered medications prior to visit.     Allergies:   Chocolate   Social History   Social History  . Marital Status: Widowed    Spouse Name: N/A  . Number of Children: N/A  . Years of Education: N/A   Social History Main Topics  . Smoking status: Former Smoker -- 0.50 packs/day for 5 years  . Smokeless  tobacco: Never Used  . Alcohol Use: 0.0 oz/week    0 Standard drinks or equivalent per week     Comment: occ  . Drug Use: No  . Sexual Activity: Not Asked   Other Topics Concern  . None   Social History Narrative     Family History:  The patient's family history includes Healthy in his son; Heart disease in his brother; Heart failure in his son; Lung cancer in his brother; Pancreatic cancer in his son; Pneumonia (age of onset: 66) in his father.   ROS:   Please see the history of present illness.    ROS All other systems reviewed and are negative.   PHYSICAL EXAM:   VS:  BP 134/82 mmHg  Pulse 60  Ht 5\' 10"  (1.778 m)  Wt 77.202 kg (170 lb 3.2 oz)  BMI 24.42 kg/m2   GEN: Well nourished, well developed, in no acute distress HEENT: normal Neck: no JVD, carotid bruits, or  masses Cardiac: Paradoxically split second heart sound, RRR; no murmurs, rubs, or gallops,no edema  Respiratory:  clear to auscultation bilaterally, normal work of breathing GI: soft, nontender, nondistended, + BS MS: no deformity or atrophy Skin: warm and dry, no rash Neuro:  Alert and Oriented x 3, 4/5 strength in left upper extremity, no discernible difference in strength in the lower extremities. He seems to have a receptive aphasia, but claims he can understand "90% of what I'm saying" Psych: euthymic mood, full affect  Wt Readings from Last 3 Encounters:  04/25/16 77.202 kg (170 lb 3.2 oz)  04/21/16 78.427 kg (172 lb 14.4 oz)  04/10/16 74.662 kg (164 lb 9.6 oz)      Studies/Labs Reviewed:   EKG:  EKG is ordered today.  The ekg ordered today demonstrates atrial fibrillation, ventricular pacing  Recent Labs: 12/29/2015: Magnesium 2.5*; TSH 0.542 03/02/2016: ALT 12* 03/04/2016: BUN 21*; Creatinine, Ser 1.21; Potassium 4.2; Sodium 138 03/06/2016: Hemoglobin 11.9*; Platelets 161   Lipid Panel    Component Value Date/Time   CHOL 125 03/03/2016 0633   TRIG 59 03/03/2016 0633   HDL 29* 03/03/2016 0633     CHOLHDL 4.3 03/03/2016 0633   VLDL 12 03/03/2016 0633   LDLCALC 84 03/03/2016 [id number]    Additional studies/ records that were reviewed today include:  Multiple notes and imaging studies and tests performed during recent hospitalization for stroke    ASSESSMENT:    1. Atrial fibrillation with slow ventricular response ([place])   2. Second degree Mobitz II AV block   3. Essential hypertension   4. Coronary artery disease involving native coronary artery of native heart without angina pectoris   5. Chronic systolic CHF (congestive heart failure) ([place])   6. Long term current use of anticoagulant therapy   7. Left hemiparesis ([place])   8. Medication dose changed      PLAN:  In order of problems listed above:  1. AFib: Despite the virtual absence of AV blocking agents he requires 88% ventricular pacing (1 minimum dose of carvedilol). He is now on warfarin anticoagulation which is being followed via Dr. Erick Blinks office, with labs being drawn by home health. Personally, I'm not sure that warfarin will provide more protection than apixaban, but at least we can confirm compliance. CHADSVasc 7 (age 40, CVA 2, CAD, HTN, CHF) 2. AV block: Requires frequent pacing. Prior to permanent atrial fibrillation he had evidence of trifascicular block. Unfortunately, ventricular pacing may be quite deleterious in the setting of depressed left ventricular systolic function. Electrophysiology evaluation was performed and the decision was made to proceed with single chamber pacing January. 3. HTN: Excellent blood pressure control 4. CAD: He has always had minor abnormalities on/nuclear studies and has never complained of angina pectoris. His cardiac myopathy appears to be nonischemic 5. CHF: He has swollen ankles, but otherwise no clear evidence for hypervolemia. His weight is unchanged from January. He denies shortness of breath (albeit with very sedentary lifestyle). I would avoid increasing his beta blocker.  Will try to increase ACE inhibitor in the setting of severely depressed left ventricular systolic function. Need to monitor renal function when we do this. 6. Warfarin anticoagulation followed via Dr. Erick Blinks office 7. Left hemiparesis, on my evaluation also seems to have considerable receptive aphasia. His son was here for the appointment and he clearly understood all of our discussion and recommendations. 8. Check labs on higher dose of lisinopril    Medication Adjustments/Labs and Tests Ordered: Current medicines are reviewed  at length with the patient today.  Concerns regarding medicines are outlined above.  Medication changes, Labs and Tests ordered today are listed in the Patient Instructions below. Patient Instructions  Medication Instructions:  Your physician has recommended you make the following change in your medication:  1- INCREASE lisinopril to 10 mg by mouth daily.   Labwork: Your physician recommends that you return for lab work in: 1 week, on or around May 02, 2016. You may go to Dr Burchette's office to have blood drawn. You will need to call their office to be put on lab schedule before you come.   Testing/Procedures: NONE  Follow-Up: Your physician recommends that you schedule a follow-up appointment in: 6 weeks with Dr [name].   Any Other Special Instructions Will Be Listed Below (If Applicable).     If you need a refill on your cardiac medications before your next appointment, please call your pharmacy.      Mikael Spray, MD  04/25/2016 3:59 PM    [facility] [address], [place], [place]  [address] Phone: [phone]; Fax: [phone]

## 2016-04-25 NOTE — Telephone Encounter (Signed)
Information faxed to home health for hospital bed. Pts son is aware.

## 2016-04-26 ENCOUNTER — Telehealth: Payer: Self-pay | Admitting: Family Medicine

## 2016-04-26 ENCOUNTER — Ambulatory Visit (INDEPENDENT_AMBULATORY_CARE_PROVIDER_SITE_OTHER): Payer: Commercial Managed Care - HMO | Admitting: General Practice

## 2016-04-26 DIAGNOSIS — I69322 Dysarthria following cerebral infarction: Secondary | ICD-10-CM | POA: Diagnosis not present

## 2016-04-26 DIAGNOSIS — I110 Hypertensive heart disease with heart failure: Secondary | ICD-10-CM | POA: Diagnosis not present

## 2016-04-26 DIAGNOSIS — I69354 Hemiplegia and hemiparesis following cerebral infarction affecting left non-dominant side: Secondary | ICD-10-CM | POA: Diagnosis not present

## 2016-04-26 DIAGNOSIS — I2510 Atherosclerotic heart disease of native coronary artery without angina pectoris: Secondary | ICD-10-CM | POA: Diagnosis not present

## 2016-04-26 DIAGNOSIS — I480 Paroxysmal atrial fibrillation: Secondary | ICD-10-CM | POA: Diagnosis not present

## 2016-04-26 LAB — POCT INR: INR: 1.9

## 2016-04-26 NOTE — Progress Notes (Signed)
Pre visit review using our clinic review tool, if applicable. No additional management support is needed unless otherwise documented below in the visit note. 

## 2016-04-26 NOTE — Telephone Encounter (Signed)
Pt daughter in law call to say pt can not afford the following the med Dutasteride-Tamsulosin HCl 0.5-0.4 MG CAPS. They contacted the pt insurance company and was told pt could take Dutasteride by itself it  only cost 13.00  She said pt cardiologist increased his  lisinopril (PRINIVIL,ZESTRIL) 10 MG tablet

## 2016-04-27 ENCOUNTER — Encounter: Payer: Self-pay | Admitting: Neurology

## 2016-04-27 ENCOUNTER — Ambulatory Visit (INDEPENDENT_AMBULATORY_CARE_PROVIDER_SITE_OTHER): Payer: Commercial Managed Care - HMO | Admitting: Neurology

## 2016-04-27 VITALS — BP 121/66 | HR 59 | Ht 70.0 in | Wt 170.0 lb

## 2016-04-27 DIAGNOSIS — I2510 Atherosclerotic heart disease of native coronary artery without angina pectoris: Secondary | ICD-10-CM | POA: Diagnosis not present

## 2016-04-27 DIAGNOSIS — I69354 Hemiplegia and hemiparesis following cerebral infarction affecting left non-dominant side: Secondary | ICD-10-CM | POA: Diagnosis not present

## 2016-04-27 DIAGNOSIS — Z7901 Long term (current) use of anticoagulants: Secondary | ICD-10-CM

## 2016-04-27 DIAGNOSIS — I69322 Dysarthria following cerebral infarction: Secondary | ICD-10-CM | POA: Diagnosis not present

## 2016-04-27 DIAGNOSIS — I6340 Cerebral infarction due to embolism of unspecified cerebral artery: Secondary | ICD-10-CM | POA: Diagnosis not present

## 2016-04-27 DIAGNOSIS — I5022 Chronic systolic (congestive) heart failure: Secondary | ICD-10-CM

## 2016-04-27 DIAGNOSIS — Z950 Presence of cardiac pacemaker: Secondary | ICD-10-CM

## 2016-04-27 DIAGNOSIS — I10 Essential (primary) hypertension: Secondary | ICD-10-CM

## 2016-04-27 DIAGNOSIS — I481 Persistent atrial fibrillation: Secondary | ICD-10-CM | POA: Diagnosis not present

## 2016-04-27 DIAGNOSIS — I110 Hypertensive heart disease with heart failure: Secondary | ICD-10-CM | POA: Diagnosis not present

## 2016-04-27 DIAGNOSIS — I480 Paroxysmal atrial fibrillation: Secondary | ICD-10-CM | POA: Diagnosis not present

## 2016-04-27 DIAGNOSIS — I4819 Other persistent atrial fibrillation: Secondary | ICD-10-CM

## 2016-04-27 MED ORDER — DUTASTERIDE 0.5 MG PO CAPS: 0.5000 mg | ORAL_CAPSULE | Freq: Every day | ORAL | Status: DC

## 2016-04-27 MED ORDER — TAMSULOSIN HCL 0.4 MG PO CAPS: 0.4000 mg | ORAL_CAPSULE | Freq: Every day | ORAL | Status: DC

## 2016-04-27 MED ORDER — APIXABAN 5 MG PO TABS: 5.0000 mg | ORAL_TABLET | Freq: Two times a day (BID) | ORAL | Status: DC

## 2016-04-27 NOTE — Progress Notes (Signed)
STROKE NEUROLOGY FOLLOW UP NOTE  NAME: Micheal Nicholson DOB: January 12, 1920  REASON FOR VISIT: stroke follow up HISTORY FROM: son and chart  Today we had the pleasure of seeing Micheal Nicholson in follow-up at our Neurology Clinic. Pt was accompanied by son.   History Summary [name] is a 82 y.o. male with history of atrial fibrillation on Eliquis, hypertension, SSS s/p pacemaker and CHF presenting on 03/02/16 with left hemiplegia. CTA head and neck suspicious for right MCA cortical infarcts, and high grade stenosis right V2, and old left PCA infarct. Pt stated compliance with eliquis. TTE with EF 25-30% down from 35-40% in 12/2015. CT chest showed nonspecific lymphademopthy, malignant vs. Reactive lympha nodes. LDL 84 and A1C 5.7. His eliquis changed to coumadin with INR goal 2.5-3.5. He was also put on lipitor 5mg  on discharge to CIR.   Interval History During the interval time, the patient has been doing well. He stayed in CIR for 3 weeks and then discharged home. He takes coumadin at home and compliant as per son, however, his INR ranging from 1.9 to 2.4. He followed with Dr. [name] and concerned about coumadin with subtherapeutic INR. Also followed with PCP and will repeat pan CT in 3-4 weeks. Pt has no specific complains. BP 121/64  REVIEW OF SYSTEMS: Full 14 system review of systems performed and notable only for those listed below and in HPI above, all others are negative:  Constitutional:   Cardiovascular:  Ear/Nose/Throat:   Skin:  Eyes:   Respiratory:   Gastroitestinal:   Genitourinary:  Hematology/Lymphatic:   Endocrine:  Musculoskeletal:   Allergy/Immunology:   Neurological:   Psychiatric: anxiety, decreased energy Sleep:   The following represents the patient's updated allergies and side effects list: Allergies  Allergen Reactions  . Chocolate Other (See Comments)    Acid reflux    The neurologically relevant items on the patient's  problem list were reviewed on today's visit.  Neurologic Examination  A problem focused neurological exam (12 or more points of the single system neurologic examination, vital signs counts as 1 point, cranial nerves count for 8 points) was performed.  Blood pressure 121/66, pulse 59, height 5\' 10"  (1.778 m), weight 170 lb (77.111 kg).  General - Well nourished, well developed, in no apparent distress.  Ophthalmologic - Fundi not visualized due to noncooperation.  Cardiovascular - irregularly irregular heart rate and rhythm.  Mental Status -  Level of arousal and orientation to time, place, and person were intact. Language including expression, naming, repetition, comprehension was assessed and found intact. Fund of Knowledge was assessed and was impaired.  Cranial Nerves II - XII - II - Visual field intact OU. III, IV, VI - Extraocular movements intact. V - Facial sensation intact bilaterally. VII - Facial movement intact bilaterally. VIII - Hearing & vestibular intact bilaterally. X - Palate elevates symmetrically. XI - Chin turning & shoulder shrug intact bilaterally. XII - Tongue protrusion intact.  Motor Strength - The patient's strength was normal in all extremities except left UE 5-/5 and pronator drift was absent.  Bulk was normal and fasciculations were absent.   Motor Tone - Muscle tone was assessed at the neck and appendages and was normal.  Reflexes - The patient's reflexes were 1+ in all extremities and he had no pathological reflexes.  Sensory - Light touch, temperature/pinprick were assessed and were normal.    Coordination - The patient had normal movements in the hands with no ataxia or dysmetria.  Tremor was absent.  Gait and Station - walk with cane, steady, slightly slow gait and stooped posturing.   Functional score  mRS = 2   0 - No symptoms.   1 - No significant disability. Able to carry out all usual activities, despite some symptoms.   2 -  Slight disability. Able to look after own affairs without assistance, but unable to carry out all previous activities.   3 - Moderate disability. Requires some help, but able to walk unassisted.   4 - Moderately severe disability. Unable to attend to own bodily needs without assistance, and unable to walk unassisted.   5 - Severe disability. Requires constant nursing care and attention, bedridden, incontinent.   6 - Dead.   NIH Stroke Scale   Level Of Consciousness 0=Alert; keenly responsive 1=Not alert, but arousable by minor stimulation 2=Not alert, requires repeated stimulation 3=Responds only with reflex movements 0  LOC Questions to Month and Age 29=Answers both questions correctly 1=Answers one question correctly 2=Answers neither question correctly 0  LOC Commands      -Open/Close eyes     -Open/close grip 0=Performs both tasks correctly 1=Performs one task correctly 2=Performs neighter task correctly 0  Best Gaze 0=Normal 1=Partial gaze palsy 2=Forced deviation, or total gaze paresis 0  Visual 0=No visual loss 1=Partial hemianopia 2=Complete hemianopia 3=Bilateral hemianopia (blind including cortical blindness) 0  Facial Palsy 0=Normal symmetrical movement 1=Minor paralysis (asymmetry) 2=Partial paralysis (lower face) 3=Complete paralysis (upper and lower face) 0  Motor  0=No drift, limb holds posture for full 10 seconds 1=Drift, limb holds posture, no drift to bed 2=Some antigravity effort, cannot maintain posture, drifts to bed 3=No effort against gravity, limb falls 4=No movement Right Arm 0     Leg 0    Left Arm 1     Leg 0  Limb Ataxia 0=Absent 1=Present in one limb 2=Present in two limbs 0  Sensory 0=Normal 1=Mild to moderate sensory loss 2=Severe to total sensory loss 0  Best Language 0=No aphasia, normal 1=Mild to moderate aphasia 2=Mute, global aphasia 3=Mute, global aphasia 0  Dysarthria 0=Normal 1=Mild to moderate 2=Severe, unintelligible or  mute/anarthric 0  Extinction/Neglect 0=No abnormality 1=Extinction to bilateral simultaneous stimulation 2=Profound neglect 0  Total   1     Data reviewed: I personally reviewed the images and agree with the radiology interpretations.  Dg Chest 2 View 03/02/2016 Cardiomegaly with central pulmonary vascular congestion and mild bilateral interstitial edema suggesting volume overload/CHF. Suspect some degree of chronic CHF based on multiple prior exams, perhaps slightly worsened today.   Ct Angio Head & Neck W/cm &/or Wo/cm 03/02/2016  1. Positive for right MCA M3 lesions in the middle sylvian division. Negative for emergent large vessel occlusion. No more proximal anterior circulation stenosis.  2. High-grade stenosis right vertebral artery V2 segment, favor due to soft atherosclerotic plaque. Otherwise negative posterior circulation.  3. Abnormal 2 cm AP window lymphadenopathy most suspicious for malignant nodal disease. Recommend follow-up chest CT with IV contrast to evaluate further.  4. Ectatic thoracic aorta and tortuous proximal great vessels.   Ct Chest W Contrast 03/03/2016  Nonspecific lymphadenopathy demonstrated in the AP window with lymph nodes measuring up to 2.5 cm diameter. Malignant or reactive lymph nodes could have this appearance. Atelectasis in the lung bases. Splenic enlargement. Enlarged spleen with enlarged lymph nodes could indicate lymphoma. Complex cystic lesion in the right kidney. Cholelithiasis.   Ct head & Cervical Spine Wo Contrast 03/02/2016 Atrophy with mild small vessel  chronic ischemic changes of deep cerebral white matter. 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 No acute intracranial abnormalities. Osseous demineralization with degenerative disc and facet disease changes of the cervical spine as above. No acute abnormalities.   CT head 03/03/16 1. No definite evolving cortically based infarct. Suspicion of right peri-Rolandic white matter infarct  (series 201, image 24). 2. No acute intracranial hemorrhage or mass effect.  2D echo - Left ventricle: The cavity size was normal. There was mild  concentric hypertrophy. Systolic function was severely reduced.  The estimated ejection fraction was in the range of 25% to 30%.  Diffuse hypokinesis. - Aortic valve: There was mild regurgitation. - Aortic root: The aortic root was normal in size. - Mitral valve: There was mild regurgitation. Valve area by  pressure half-time: 1.08 cm^2. - Left atrium: The atrium was mildly dilated. - Right ventricle: The cavity size was moderately dilated. Wall  thickness was normal. Systolic function was moderately reduced. - Right atrium: The atrium was moderately dilated. - Tricuspid valve: There was moderate-severe regurgitation. - Pulmonary arteries: Systolic pressure was severely increased. PA  peak pressure: 65 mm Hg (S). - Inferior vena cava: The vessel was normal in size. - Pericardium, extracardiac: There was no pericardial effusion  Component     Latest Ref Rng 03/03/2016  Cholesterol     0 - 200 mg/dL 125  Triglycerides     <150 mg/dL 59  HDL Cholesterol     >40 mg/dL 29 (L)  Total CHOL/HDL Ratio      4.3  VLDL     0 - 40 mg/dL 12  LDL (calc)     0 - 99 mg/dL 84  Hemoglobin A1C     4.8 - 5.6 % 5.7 (H)  Mean Plasma Glucose      117    Assessment: As you may recall, he is a 82 y.o. Caucasian male with PMH of afib on Eliquis, HTN, SSS s/p pacemaker and CHF presenting on 03/02/16 with left hemiplegia. CTA head and neck suspicious for right MCA cortical infarcts, and high grade stenosis right V2, and old left PCA infarct. Pt stated compliance with eliquis. TTE with EF 25-30% down from 35-40% in 12/2015. CT chest showed nonspecific lymphademopthy, malignant vs. Reactive lympha nodes. LDL 84 and A1C 5.7. His eliquis changed to coumadin with INR goal 2.5-3.5. He was also put on lipitor 5mg  on discharge to CIR. During the interval time, the  patient has been doing well. However, his INR ranging from 1.9 to 2.4. Due to difficulty INR management, will change back to eliquis.  Plan:  - stop coumadin and check INR today - if INR < 2.0, will stat eliquis 5mg  twice a day. If INR > 2.0, will have to wait until INR less than 2.0 to start eliquis - compliant with eliquis, no missing doses - continue lipitor for stroke prevention - follow up with cardiology for CHF and afib and pacemaker - follow up with PCP for repeat CT chest, abdomen and pelvis - Follow up with your primary care physician for stroke risk factor modification. Recommend maintain blood pressure goal <130/80, diabetes with hemoglobin A1c goal below 6.5% and lipids with LDL cholesterol goal below 70 mg/dL.  - check BP at home and record - follow up in 3 months.  I spent more than 25 minutes of face to face time with the patient. Greater than 50% of time was spent in counseling and coordination of care. We discussed changing anticoagulation regimen, follow up  with cardiology and PCP, BP monitoring at home and compliance with medication.    Orders Placed This Encounter  Procedures  . Basic metabolic panel  . CBC  . Protime-INR    Meds ordered this encounter  Medications  . apixaban (ELIQUIS) 5 MG TABS tablet    Sig: Take 1 tablet (5 mg total) by mouth 2 (two) times daily.    Dispense:  60 tablet    Refill:  5    Patient Instructions  - stop coumadin from today - will check INR today and I will let you know the result - if INR < 2.0, you can stat eliquis 5mg  twice a day. If INR > 2.0, you have to wait until INR less than 2.0 to start eliquis - compliant with eliquis, no missing doses - continue lipitor for stroke prevention - follow up with Dr. Loletha Grayer for CHF and afib and pacemaker - follow up with PCP for repeat CT chest, abdomen and pelvis - Follow up with your primary care physician for stroke risk factor modification. Recommend maintain blood pressure goal  <130/80, diabetes with hemoglobin A1c goal below 6.5% and lipids with LDL cholesterol goal below 70 mg/dL.  - check BP at home and record - follow up in 3 months.    Rosalin Hawking, MD PhD [facility] [address] [place], [place] [address] [phone]

## 2016-04-27 NOTE — Patient Instructions (Addendum)
-   stop coumadin from today - will check INR today and I will let you know the result - if INR < 2.0, you can stat eliquis 5mg  twice a day. If INR > 2.0, you have to wait until INR less than 2.0 to start eliquis - compliant with eliquis, no missing doses - continue lipitor for stroke prevention - follow up with Dr. Loletha Grayer for CHF and afib and pacemaker - follow up with PCP for repeat CT chest, abdomen and pelvis - Follow up with your primary care physician for stroke risk factor modification. Recommend maintain blood pressure goal <130/80, diabetes with hemoglobin A1c goal below 6.5% and lipids with LDL cholesterol goal below 70 mg/dL.  - check BP at home and record - follow up in 3 months.

## 2016-04-27 NOTE — Telephone Encounter (Signed)
What we can do is: #1) D/c Jalyn (Dutasteride-Tamsulosin) and #2) prescribe Tamsulosin 0.4 mg once daily and Dutasteride 0.5 mg one daily.

## 2016-04-27 NOTE — Telephone Encounter (Signed)
Spoke with son. He is aware that new Rxs have been sent into the pharmacy.

## 2016-04-28 ENCOUNTER — Telehealth: Payer: Self-pay | Admitting: Family Medicine

## 2016-04-28 ENCOUNTER — Telehealth: Payer: Self-pay | Admitting: Neurology

## 2016-04-28 DIAGNOSIS — Z950 Presence of cardiac pacemaker: Secondary | ICD-10-CM | POA: Insufficient documentation

## 2016-04-28 DIAGNOSIS — Z7901 Long term (current) use of anticoagulants: Secondary | ICD-10-CM | POA: Insufficient documentation

## 2016-04-28 DIAGNOSIS — I5022 Chronic systolic (congestive) heart failure: Secondary | ICD-10-CM | POA: Insufficient documentation

## 2016-04-28 LAB — CBC
HEMOGLOBIN: 11.2 g/dL — AB (ref 12.6–17.7)
Hematocrit: 32.2 % — ABNORMAL LOW (ref 37.5–51.0)
MCH: 29.3 pg (ref 26.6–33.0)
MCHC: 34.8 g/dL (ref 31.5–35.7)
MCV: 84 fL (ref 79–97)
PLATELETS: 162 10*3/uL (ref 150–379)
RBC: 3.82 x10E6/uL — AB (ref 4.14–5.80)
RDW: 15.8 % — ABNORMAL HIGH (ref 12.3–15.4)
WBC: 3.9 10*3/uL (ref 3.4–10.8)

## 2016-04-28 LAB — BASIC METABOLIC PANEL
BUN/Creatinine Ratio: 16 (ref 10–24)
BUN: 20 mg/dL (ref 10–36)
CO2: 24 mmol/L (ref 18–29)
CREATININE: 1.29 mg/dL — AB (ref 0.76–1.27)
Calcium: 8.8 mg/dL (ref 8.6–10.2)
Chloride: 100 mmol/L (ref 96–106)
GFR, EST AFRICAN AMERICAN: 54 mL/min/{1.73_m2} — AB (ref >59)
GFR, EST NON AFRICAN AMERICAN: 47 mL/min/{1.73_m2} — AB (ref >59)
Glucose: 107 mg/dL — ABNORMAL HIGH (ref 65–99)
Potassium: 4.8 mmol/L (ref 3.5–5.2)
SODIUM: 140 mmol/L (ref 134–144)

## 2016-04-28 LAB — PROTIME-INR
INR: 2.3 — AB (ref 0.8–1.2)
PROTHROMBIN TIME: 23.9 s — AB (ref 9.1–12.0)

## 2016-04-28 NOTE — Telephone Encounter (Signed)
Pt scheduled  

## 2016-04-28 NOTE — Telephone Encounter (Signed)
Called pt son via the number on file. Pt INR yesterday was 2.3 and son stated that he did not take coumadin last night, and coumadin has been taken out from his medication boxes. I recommend son to take pt to Dr. Erick Blinks office on Monday to check INR, if less than 2.0, he can start eliquis at that time.   Pt will continue to follow up with Dr. Elease Hashimoto for renal function monitoring. Yesterday Cre 1.29. If Cre more than 1.5, his eliquis dose needs to be decreased to 2.5mg  bid. Son expressed understanding and appreciation.   Rosalin Hawking, MD PhD Stroke Neurology 04/28/2016 12:59 PM

## 2016-04-28 NOTE — Telephone Encounter (Signed)
yes

## 2016-04-28 NOTE — Telephone Encounter (Signed)
Pt would like to have his Basic Metabolic Panel that Dr. Dani Gobble Croitoru has placed an order for.  Is it okay to schedule same time of his INR on Monday?

## 2016-05-01 ENCOUNTER — Ambulatory Visit (INDEPENDENT_AMBULATORY_CARE_PROVIDER_SITE_OTHER): Payer: Commercial Managed Care - HMO | Admitting: General Practice

## 2016-05-01 ENCOUNTER — Other Ambulatory Visit (INDEPENDENT_AMBULATORY_CARE_PROVIDER_SITE_OTHER): Payer: Commercial Managed Care - HMO

## 2016-05-01 DIAGNOSIS — Z7901 Long term (current) use of anticoagulants: Secondary | ICD-10-CM | POA: Diagnosis not present

## 2016-05-01 DIAGNOSIS — Z79899 Other long term (current) drug therapy: Secondary | ICD-10-CM

## 2016-05-01 DIAGNOSIS — Z5181 Encounter for therapeutic drug level monitoring: Secondary | ICD-10-CM | POA: Diagnosis not present

## 2016-05-01 DIAGNOSIS — I480 Paroxysmal atrial fibrillation: Secondary | ICD-10-CM

## 2016-05-01 LAB — POCT INR: INR: 1.4

## 2016-05-01 NOTE — Progress Notes (Signed)
Pre visit review using our clinic review tool, if applicable. No additional management support is needed unless otherwise documented below in the visit note. 

## 2016-05-02 LAB — BASIC METABOLIC PANEL
BUN: 23 mg/dL (ref 7–25)
CHLORIDE: 105 mmol/L (ref 98–110)
CO2: 26 mmol/L (ref 20–31)
Calcium: 9 mg/dL (ref 8.6–10.3)
Creat: 1.2 mg/dL — ABNORMAL HIGH (ref 0.70–1.11)
Glucose, Bld: 96 mg/dL (ref 65–99)
POTASSIUM: 4.3 mmol/L (ref 3.5–5.3)
SODIUM: 137 mmol/L (ref 135–146)

## 2016-05-05 ENCOUNTER — Ambulatory Visit (INDEPENDENT_AMBULATORY_CARE_PROVIDER_SITE_OTHER): Payer: Commercial Managed Care - HMO | Admitting: Family Medicine

## 2016-05-05 ENCOUNTER — Emergency Department (HOSPITAL_COMMUNITY): Payer: Commercial Managed Care - HMO

## 2016-05-05 ENCOUNTER — Encounter (HOSPITAL_COMMUNITY): Payer: Self-pay | Admitting: Emergency Medicine

## 2016-05-05 ENCOUNTER — Encounter: Payer: Self-pay | Admitting: Cardiovascular Disease

## 2016-05-05 ENCOUNTER — Emergency Department (HOSPITAL_COMMUNITY)
Admission: EM | Admit: 2016-05-05 | Discharge: 2016-05-05 | Disposition: A | Discharge status: Home / Self Care | Payer: Commercial Managed Care - HMO | Attending: Emergency Medicine | Admitting: Emergency Medicine

## 2016-05-05 DIAGNOSIS — Z7901 Long term (current) use of anticoagulants: Secondary | ICD-10-CM | POA: Diagnosis not present

## 2016-05-05 DIAGNOSIS — Z9889 Other specified postprocedural states: Secondary | ICD-10-CM | POA: Insufficient documentation

## 2016-05-05 DIAGNOSIS — Z79899 Other long term (current) drug therapy: Secondary | ICD-10-CM | POA: Diagnosis not present

## 2016-05-05 DIAGNOSIS — R531 Weakness: Secondary | ICD-10-CM

## 2016-05-05 DIAGNOSIS — Z87891 Personal history of nicotine dependence: Secondary | ICD-10-CM | POA: Insufficient documentation

## 2016-05-05 DIAGNOSIS — I5042 Chronic combined systolic (congestive) and diastolic (congestive) heart failure: Secondary | ICD-10-CM | POA: Insufficient documentation

## 2016-05-05 DIAGNOSIS — M1990 Unspecified osteoarthritis, unspecified site: Secondary | ICD-10-CM | POA: Diagnosis not present

## 2016-05-05 DIAGNOSIS — R05 Cough: Secondary | ICD-10-CM | POA: Diagnosis not present

## 2016-05-05 DIAGNOSIS — I959 Hypotension, unspecified: Secondary | ICD-10-CM

## 2016-05-05 DIAGNOSIS — I6782 Cerebral ischemia: Secondary | ICD-10-CM | POA: Diagnosis not present

## 2016-05-05 DIAGNOSIS — F419 Anxiety disorder, unspecified: Secondary | ICD-10-CM | POA: Diagnosis not present

## 2016-05-05 DIAGNOSIS — K219 Gastro-esophageal reflux disease without esophagitis: Secondary | ICD-10-CM | POA: Insufficient documentation

## 2016-05-05 DIAGNOSIS — I10 Essential (primary) hypertension: Secondary | ICD-10-CM | POA: Insufficient documentation

## 2016-05-05 DIAGNOSIS — R5383 Other fatigue: Secondary | ICD-10-CM | POA: Insufficient documentation

## 2016-05-05 DIAGNOSIS — Z7951 Long term (current) use of inhaled steroids: Secondary | ICD-10-CM | POA: Diagnosis not present

## 2016-05-05 DIAGNOSIS — N400 Enlarged prostate without lower urinary tract symptoms: Secondary | ICD-10-CM | POA: Diagnosis not present

## 2016-05-05 DIAGNOSIS — Z8673 Personal history of transient ischemic attack (TIA), and cerebral infarction without residual deficits: Secondary | ICD-10-CM | POA: Diagnosis not present

## 2016-05-05 DIAGNOSIS — R404 Transient alteration of awareness: Secondary | ICD-10-CM | POA: Diagnosis not present

## 2016-05-05 DIAGNOSIS — I38 Endocarditis, valve unspecified: Secondary | ICD-10-CM | POA: Diagnosis not present

## 2016-05-05 LAB — CBC
HCT: 33.9 % — ABNORMAL LOW (ref 39.0–52.0)
Hemoglobin: 11.4 g/dL — ABNORMAL LOW (ref 13.0–17.0)
MCH: 28.8 pg (ref 26.0–34.0)
MCHC: 33.6 g/dL (ref 30.0–36.0)
MCV: 85.6 fL (ref 78.0–100.0)
PLATELETS: 151 10*3/uL (ref 150–400)
RBC: 3.96 MIL/uL — AB (ref 4.22–5.81)
RDW: 15 % (ref 11.5–15.5)
WBC: 5 10*3/uL (ref 4.0–10.5)

## 2016-05-05 LAB — BASIC METABOLIC PANEL
Anion gap: 12 (ref 5–15)
BUN: 26 mg/dL — ABNORMAL HIGH (ref 6–20)
CALCIUM: 9 mg/dL (ref 8.9–10.3)
CO2: 25 mmol/L (ref 22–32)
CREATININE: 1.3 mg/dL — AB (ref 0.61–1.24)
Chloride: 101 mmol/L (ref 101–111)
GFR, EST AFRICAN AMERICAN: 52 mL/min — AB (ref >60)
GFR, EST NON AFRICAN AMERICAN: 45 mL/min — AB (ref >60)
GLUCOSE: 103 mg/dL — AB (ref 65–99)
Potassium: 4.3 mmol/L (ref 3.5–5.1)
Sodium: 138 mmol/L (ref 135–145)

## 2016-05-05 LAB — URINALYSIS, ROUTINE W REFLEX MICROSCOPIC
BILIRUBIN URINE: NEGATIVE
Glucose, UA: NEGATIVE mg/dL
HGB URINE DIPSTICK: NEGATIVE
Ketones, ur: NEGATIVE mg/dL
Leukocytes, UA: NEGATIVE
NITRITE: NEGATIVE
PROTEIN: NEGATIVE mg/dL
Specific Gravity, Urine: 1.015 (ref 1.005–1.030)
pH: 6 (ref 5.0–8.0)

## 2016-05-05 LAB — BRAIN NATRIURETIC PEPTIDE: B Natriuretic Peptide: 542.1 pg/mL — ABNORMAL HIGH (ref 0.0–100.0)

## 2016-05-05 LAB — TROPONIN I: Troponin I: 0.03 ng/mL (ref <0.031)

## 2016-05-05 LAB — CBG MONITORING, ED: GLUCOSE-CAPILLARY: 91 mg/dL (ref 65–99)

## 2016-05-05 MED ORDER — SODIUM CHLORIDE 0.9 % IV BOLUS (SEPSIS)
500.0000 mL | Freq: Once | INTRAVENOUS | Status: AC
  Administered 2016-05-05: 500 mL via INTRAVENOUS

## 2016-05-05 NOTE — ED Notes (Signed)
Per EMS pt sent for further evaluation per PCP for weakness; per son pt strong and dark urine. Pt current complaint of chronic mid back pain.

## 2016-05-05 NOTE — ED Notes (Signed)
Patient does not know what pacemaker he has. Patient's family member is calling a son to see if they can find the type of pacemaker.

## 2016-05-05 NOTE — ED Notes (Signed)
A representative from [facility] and interrogated pt.'s pacemaker. Reported to be functioning well.

## 2016-05-05 NOTE — ED Notes (Signed)
Pt attempted to use the restroom and was unable to. Urinal at bedside

## 2016-05-05 NOTE — Care Management Note (Signed)
Case Management Note  Patient Details  Name: Micheal Nicholson MRN: [id number] Date of Birth: May 31, 1920  Subjective/Objective: Patient presents to Ed with weakness.                   Action/Plan:EDCM spoke to patient's son at bedside per patient request.  Patient's son is agreeable to resume home health services with [facility] again as patient has weakness.  EDCM discussed patient with EDP who placed orders for home health RN, PT OT aide and social worker.  Patient's son reports patient does nto require any further dme.  Patient has a walker at home and is expecting a hospital bed from [facility].  Patient's son reports the patient has been doing very well at home up until today when he started to feel very weak.  [facility] faxed home health orders to [facility] with confirmation of receipt.  No further EDCM needs at this time.   Expected Discharge Date:                  Expected Discharge Plan:  [place]  In-House Referral:     Discharge planning Services  CM Consult  Post Acute Care Choice:  Home Health Choice offered to:  Patient, Adult Children  DME Arranged:    DME Agency:     HH Arranged:  RN, PT, OT, Nurse's Aide, Social Work [employer] Agency:  [place]  Status of Service:  Completed, signed off  Medicare Important Message Given:    Date Medicare IM Given:    Medicare IM give by:    Date Additional Medicare IM Given:    Additional Medicare Important Message give by:     If discussed at [place] of Stay Meetings, dates discussed:    Additional CommentsLivia Snellen, RN 05/05/2016, 11:12 PM

## 2016-05-05 NOTE — ED Notes (Signed)
Patient transported to X-ray 

## 2016-05-05 NOTE — ED Notes (Signed)
Sats charted at 88%. Pulse ox repositioned and SATS-96% on room air.

## 2016-05-05 NOTE — Discharge Instructions (Signed)
Near-Syncope Near-syncope (commonly known as near fainting) is sudden weakness, dizziness, or feeling like you might pass out. During an episode of near-syncope, you may also develop pale skin, have tunnel vision, or feel sick to your stomach (nauseous). Near-syncope may occur when getting up after sitting or while standing for a long time. It is caused by a sudden decrease in blood flow to the brain. This decrease can result from various causes or triggers, most of which are not serious. However, because near-syncope can sometimes be a sign of something serious, a medical evaluation is required. The specific cause is often not determined. HOME CARE INSTRUCTIONS  Monitor your condition for any changes. The following actions may help to alleviate any discomfort you are experiencing:  Have someone stay with you until you feel stable.  Lie down right away and prop your feet up if you start feeling like you might faint. Breathe deeply and steadily. Wait until all the symptoms have passed. Most of these episodes last only a few minutes. You may feel tired for several hours.   Drink enough fluids to keep your urine clear or pale yellow.   If you are taking blood pressure or heart medicine, get up slowly when seated or lying down. Take several minutes to sit and then stand. This can reduce dizziness.  Follow up with your health care provider as directed. SEEK IMMEDIATE MEDICAL CARE IF:   You have a severe headache.   You have unusual pain in the chest, abdomen, or back.   You are bleeding from the mouth or rectum, or you have black or tarry stool.   You have an irregular or very fast heartbeat.   You have repeated fainting or have seizure-like jerking during an episode.   You faint when sitting or lying down.   You have confusion.   You have difficulty walking.   You have severe weakness.   You have vision problems.  MAKE SURE YOU:   Understand these instructions.  Will  watch your condition.  Will get help right away if you are not doing well or get worse.   This information is not intended to replace advice given to you by your health care provider. Make sure you discuss any questions you have with your health care provider.   Document Released: 12/11/2005 Document Revised: 12/16/2013 Document Reviewed: 05/16/2013 Elsevier Interactive Patient Education 2016 [employer].  Fatigue Fatigue is feeling tired all of the time, a lack of energy, or a lack of motivation. Occasional or mild fatigue is often a normal response to activity or life in general. However, long-lasting (chronic) or extreme fatigue may indicate an underlying medical condition. HOME CARE INSTRUCTIONS  Watch your fatigue for any changes. The following actions may help to lessen any discomfort you are feeling:  Talk to your health care provider about how much sleep you need each night. Try to get the required amount every night.  Take medicines only as directed by your health care provider.  Eat a healthy and nutritious diet. Ask your health care provider if you need help changing your diet.  Drink enough fluid to keep your urine clear or pale yellow.  Practice ways of relaxing, such as yoga, meditation, massage therapy, or acupuncture.  Exercise regularly.   Change situations that cause you stress. Try to keep your work and personal routine reasonable.  Do not abuse illegal drugs.  Limit alcohol intake to no more than 1 drink per day for nonpregnant women and 2  drinks per day for men. One drink equals 12 ounces of beer, 5 ounces of wine, or 1 ounces of hard liquor.  Take a multivitamin, if directed by your health care provider. SEEK MEDICAL CARE IF:   Your fatigue does not get better.  You have a fever.   You have unintentional weight loss or gain.  You have headaches.   You have difficulty:   Falling asleep.  Sleeping throughout the night.  You feel angry,  guilty, anxious, or sad.   You are unable to have a bowel movement (constipation).   You skin is dry.   Your legs or another part of your body is swollen.  SEEK IMMEDIATE MEDICAL CARE IF:   You feel confused.   Your vision is blurry.  You feel faint or pass out.   You have a severe headache.   You have severe abdominal, pelvic, or back pain.   You have chest pain, shortness of breath, or an irregular or fast heartbeat.   You are unable to urinate or you urinate less than normal.   You develop abnormal bleeding, such as bleeding from the rectum, vagina, nose, lungs, or nipples.  You vomit blood.   You have thoughts about harming yourself or committing suicide.   You are worried that you might harm someone else.    This information is not intended to replace advice given to you by your health care provider. Make sure you discuss any questions you have with your health care provider.   Document Released: 10/08/2007 Document Revised: 01/01/2015 Document Reviewed: 04/14/2014 Elsevier Interactive Patient Education [employer].

## 2016-05-05 NOTE — ED Notes (Signed)
Bed: WA03 Expected date:  Expected time:  Means of arrival:  Comments: EMS-weak

## 2016-05-05 NOTE — Progress Notes (Signed)
Subjective:    Patient ID: Micheal Nicholson, male    DOB: [birth year], 82 y.o.   MRN: [id number]  HPI Patient is seen as an acute work in with nonspecific symptoms of generalized weakness for the past couple days  He has history of multiple chronic medical problems including history of atrial fibrillation (on chronic anticoagulation), history of CVA, hypertension, combined systolic and diastolic heart failure, CAD, BPH, and chronic anxiety.  Couple days ago developed generalized weakness. No reported fever. No focal weakness. Denied any chest pain. No dysuria. Very weak with ambulation. Occasional cough Had basic metabolic panel on [id number] per cardiology which was stable. Recent CBC on 04/27/16 hemoglobin stable 11.2 He had recent fall with flap laceration right forearm but denies any head injury.  No hip pain.  Has some bilateral lower lumbar back pain. Back pain not worse with ambulation. Denies headache.  Past Medical History  Diagnosis Date  . Hypertension   . Anxiety   . BPH (benign prostatic hyperplasia)   . Chronic combined systolic and diastolic CHF, NYHA class 2 ([place]) 04/17/2015  . CHF (congestive heart failure) ([place])   . Symptomatic sinus bradycardia     PPM Dr. Rayann Heman, 01/13/16, STJ device  . GERD (gastroesophageal reflux disease)   . Arthritis   . Stroke [facility])    Past Surgical History  Procedure Laterality Date  . Cardiac catheterization  2006    Negative / 4 years ago  . Ep implantable device N/A 01/13/2016    Procedure: Pacemaker Implant;  Surgeon: Thompson Grayer, MD;  Location: [place] CV LAB;  Service: Cardiovascular;  Laterality: N/A;    reports that he has quit smoking. He has never used smokeless tobacco. He reports that he drinks about 0.6 oz of alcohol per week. He reports that he does not use illicit drugs. family history includes Healthy in his son; Heart disease in his brother; Heart failure in his son; Lung cancer in his brother; Pancreatic cancer in  his son; Pneumonia (age of onset: 72) in his father. Allergies  Allergen Reactions  . Chocolate Other (See Comments)    Acid reflux      Review of Systems  Constitutional: Positive for fatigue. Negative for fever and chills.  Respiratory: Negative for cough, shortness of breath and wheezing.   Cardiovascular: Positive for leg swelling. Negative for chest pain and palpitations.  Gastrointestinal: Negative for nausea, vomiting, abdominal pain, diarrhea, constipation and blood in stool.  Genitourinary: Negative for dysuria.  Neurological: Positive for weakness. Negative for syncope and headaches.       Objective:   Physical Exam  Constitutional:  Patient somewhat pale in appearance. He is sitting up unassisted but appears very weak overall  HENT:  Mouth/Throat: Oropharynx is clear and moist.  Cardiovascular: Normal rate.   Pulmonary/Chest: Effort normal and breath sounds normal. No respiratory distress. He has no wheezes. He has no rales.  Abdominal: Soft. There is no tenderness.  Musculoskeletal: He exhibits edema.  Neurological: No cranial nerve deficit.  No focal weakness Somewhat sedate but answering questions.          Assessment & Plan   82 year old male who presents with 2 day history of generalized weakness with hypotension and hypoxemia. Is having difficulties with ambulating. No reported fever. Complex past medical history as above.  We recommended ER evaluation as he will need labs and other evaluation we cannot do here in the outpatient setting today. He had difficulty ambulating from the waiting area to exam  room with walker.  He is DNR.  Suspect he will need higher level of care than family can give soon.  Consider Palliative Care consult.   [name] MD [facility] at [facility]

## 2016-05-05 NOTE — ED Notes (Signed)
[facility] rep paged and is to return call for pacer interoogation.

## 2016-05-05 NOTE — ED Provider Notes (Signed)
CSN: [id number]     Arrival date & time 05/05/16  1511 History   First MD Initiated Contact with Patient 05/05/16 1530     Chief Complaint  Patient presents with  . 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  Patient was seen at about her primary care, where reportedly his blood pressures were in the 90s, and he was hypoxic, however there are no recorded vital signs in the chart. On arrival to the emergency department, patient has normal vital signs, normal blood pressure.  No recent changes in medications. Has chronic cough which has not changed. Urine darker however no dysuria/hematuria/abd pain/n/v/diarrhea. No numbness/weakness of one side or other.   Past Medical History  Diagnosis Date  . Hypertension   . Anxiety   . BPH (benign prostatic hyperplasia)   . Chronic combined systolic and diastolic CHF, NYHA class 2 ([place]) 04/17/2015  . CHF (congestive heart failure) ([place])   . Symptomatic sinus bradycardia     PPM Dr. Rayann Heman, 01/13/16, STJ device  . GERD (gastroesophageal reflux disease)   . Arthritis   . Stroke [facility])    Past Surgical History  Procedure Laterality Date  . Cardiac catheterization  2006    Negative / 4 years ago  . Ep implantable device N/A 01/13/2016    Procedure: Pacemaker Implant;  Surgeon: Thompson Grayer, MD;  Location: [place] [facility];  Service: Cardiovascular;  Laterality: N/A;   Family History  Problem Relation Age of Onset  . Pneumonia Father 56    died  . Lung cancer Brother   . Heart disease Brother   . Pancreatic cancer Son   . Healthy Son   . Heart failure Son    Social History  Substance Use Topics  . Smoking status: Former Smoker --  0.50 packs/day for 5 years  . Smokeless tobacco: Never Used  . Alcohol Use: 0.6 oz/week    0 Standard drinks or equivalent, 1 Shots of liquor per week     Comment: occ    Review of Systems  Constitutional: Positive for fatigue. Negative for fever.  HENT: Negative for sore throat.   Eyes: Negative for visual disturbance.  Respiratory: Positive for cough (chronic, unchanged). Negative for shortness of breath.   Cardiovascular: Negative for chest pain.  Gastrointestinal: Negative for nausea, vomiting, abdominal pain and diarrhea.  Genitourinary: Negative for dysuria and difficulty urinating.  Musculoskeletal: Negative for back pain and neck stiffness.  Skin: Negative for rash.  Neurological: Positive for weakness (generalized). Negative for tremors, syncope, facial asymmetry, speech difficulty, numbness and headaches.      Allergies  Chocolate  Home Medications   Prior to Admission medications   Medication Sig Start Date End Date Taking? Authorizing Provider  ALPRAZolam Duanne Moron) 0.5 MG tablet Take 0.5 mg by mouth 3 (three) times daily as needed for anxiety.   Yes Historical Provider, MD  apixaban (ELIQUIS) 5 MG TABS tablet Take 1 tablet (5 mg total) by mouth 2 (two) times daily. 04/27/16  Yes Rosalin Hawking, MD  atorvastatin (LIPITOR) 10 MG tablet Take 0.5 tablets (5 mg total) by mouth daily at 6 PM. 03/06/16  Yes Nishant Dhungel, MD  Camphor-Eucalyptus-Menthol (VICKS VAPORUB EX) Apply 1 application topically daily as needed (to help breathe).    Yes Historical Provider,  MD  carvedilol (COREG) 3.125 MG tablet Take 1 tablet (3.125 mg total) by mouth 2 (two) times daily with a meal. 04/12/16  Yes [name], MD  dutasteride (AVODART) 0.5 MG capsule Take 1 capsule (0.5 mg total) by mouth daily. 04/27/16  Yes [name], MD  fluticasone (FLONASE) 50 MCG/ACT nasal spray Place 2 sprays into both nostrils 2 (two) times daily.    Yes Historical Provider, MD  furosemide (LASIX) 20 MG  tablet TAKE 1 TALBET BY MOUTH DAILY ON MONDAYS, WEDNESDAYS, AND FRIDAYS Patient taking differently: TAKE 20 MG BY MOUTH DAILY ON MONDAYS, WEDNESDAYS, AND FRIDAYS 04/17/16  Yes Mihai Croitoru, MD  gabapentin (NEURONTIN) 100 MG capsule Take 2 capsules (200 mg total) by mouth 3 (three) times daily. 04/12/16  Yes [name], MD  guaiFENesin (MUCINEX) 600 MG 12 hr tablet Take 1 tablet (600 mg total) by mouth 2 (two) times daily. 12/23/15  Yes Renee A Kuneff, DO  KLOR-CON M10 10 MEQ tablet Take 10 mEq by mouth daily.  07/23/15  Yes Historical Provider, MD  lisinopril (PRINIVIL,ZESTRIL) 5 MG tablet Take 5 mg by mouth daily. 04/15/16  Yes Historical Provider, MD  omeprazole (PRILOSEC) 20 MG capsule TAKE ONE CAPSULE BY MOUTH EVERY DAY Patient taking differently: TAKE 20 MG BY MOUTH EVERY DAY 04/17/16  Yes [name], MD  polyethylene glycol (MIRALAX / GLYCOLAX) packet Take 17 g by mouth daily. 03/06/16  Yes Nishant Dhungel, MD  pramipexole (MIRAPEX) 0.125 MG tablet TAKE 1 TABLET BY MOUTH AT BEDTIME Patient taking differently: TAKE 0.125 MG BY MOUTH AT BEDTIME 02/03/16  Yes [name], MD  senna-docusate (SENOKOT-S) 8.6-50 MG tablet Take 1 tablet by mouth at bedtime as needed for mild constipation. 03/06/16  Yes Nishant Dhungel, MD  sertraline (ZOLOFT) 100 MG tablet TAKE 1.5 TABLETS BY MOUTH DAILY Patient taking differently: TAKE 1.5 TABLETS (150mg ) BY MOUTH DAILY 10/25/15  Yes [name], MD  tamsulosin (FLOMAX) 0.4 MG CAPS capsule Take 1 capsule (0.4 mg total) by mouth daily. 04/27/16  Yes [name], MD  Vitamin D, Ergocalciferol, (DRISDOL) 50000 UNITS CAPS capsule Take 1 capsule (50,000 Units total) by mouth every 7 (seven) days. 03/08/15  Yes [name], MD  lisinopril (PRINIVIL,ZESTRIL) 10 MG tablet Take 1 tablet (10 mg total) by mouth daily. Patient not taking: Reported on 05/05/2016 04/25/16   Mihai Croitoru, MD   BP 122/89 mmHg  Pulse 63  Temp(Src) 97.5 F (36.4 C)  (Oral)  Resp 17  SpO2 96% Physical Exam  Constitutional: He is oriented to person, place, and time. He appears well-developed and well-nourished. No distress.  HENT:  Head: Normocephalic and atraumatic.  Mouth/Throat: No oropharyngeal exudate.  Eyes: Conjunctivae and EOM are normal. Pupils are equal, round, and reactive to light.  Neck: Normal range of motion.  Cardiovascular: Normal rate, regular rhythm, normal heart sounds and intact distal pulses.  Exam reveals no gallop and no friction rub.   No murmur heard. Pulmonary/Chest: Effort normal and breath sounds normal. No respiratory distress. He has no wheezes. He has no rales.  Abdominal: Soft. He exhibits no distension. There is no tenderness. There is no guarding.  Musculoskeletal: He exhibits no edema.  Neurological: He is alert and oriented to person, place, and time. He displays no tremor. No cranial nerve deficit or sensory deficit. Coordination (normal finger to nose, no truncal ataxia) normal. GCS eye subscore is 4. GCS verbal subscore is 5. GCS motor subscore is 6.  Weakness LUE chronic per pt from prior CVA  Skin: Skin is warm and dry. He is not diaphoretic.  Nursing note and vitals reviewed.   ED Course  Procedures (including critical care time) Labs Review Labs Reviewed  BASIC METABOLIC PANEL - Abnormal; Notable for the following:    Glucose, Bld 103 (*)    BUN 26 (*)    Creatinine, Ser 1.30 (*)    GFR calc non Af Amer 45 (*)    GFR calc Af Amer 52 (*)    All other components within normal limits  CBC - Abnormal; Notable for the following:    RBC 3.96 (*)    Hemoglobin 11.4 (*)    HCT 33.9 (*)    All other components within normal limits  BRAIN NATRIURETIC PEPTIDE - Abnormal; Notable for the following:    B Natriuretic Peptide 542.1 (*)    All other components within normal limits  URINALYSIS, ROUTINE W REFLEX MICROSCOPIC (NOT AT [facility])  TROPONIN I  CBG MONITORING, ED    Imaging Review Dg Chest 2  View  05/05/2016  CLINICAL DATA:  Generalized weakness for 4 days. Congestive heart failure. EXAM: CHEST  2 VIEW COMPARISON:  03/02/2016 FINDINGS: Mild to moderate cardiomegaly remains stable. Single lead transvenous pacemaker remains in appropriate position. Chronic pulmonary venous hypertension is unchanged. No evidence of acute infiltrate or consolidation. No evidence of pneumothorax or pleural effusion. Several old left rib fracture deformities again noted. IMPRESSION: Stable cardiomegaly and pulmonary venous hypertension. No acute findings. Electronically Signed   By: Earle Gell M.D.   On: 05/05/2016 19:24   Ct Head Wo Contrast  05/05/2016  CLINICAL DATA:  Per EMS pt sent for further evaluation per PCP for weakness; per son pt strong and dark urine. Pt current complaint of chronic mid back pain. Reported falls EXAM: CT HEAD WITHOUT CONTRAST TECHNIQUE: Contiguous axial images were obtained from the base of the skull through the vertex without intravenous contrast. COMPARISON:  03/03/2016 FINDINGS: There is central and cortical atrophy. Periventricular white matter changes are consistent with small vessel disease. Owens Shark been interval progression of white matter ischemia in the right posterior frontal lobe. There is no intra or extra-axial fluid collection or mass lesion. The basilar cisterns and ventricles have a normal appearance. There is no CT evidence for acute infarction or hemorrhage. There is mucoperiosteal thickening within the paranasal sinuses. IMPRESSION: 1. Atrophy and small vessel disease. 2. Progression of white matter ischemic changes in the right frontal lobe. 3.  No evidence for acute intracranial abnormality. Electronically Signed   By: Nolon Nations M.D.   On: 05/05/2016 19:50   I have personally reviewed and evaluated these images and lab results as part of my medical decision-making.   EKG Interpretation   Date/Time:  Friday May 05 2016 15:44:02 EDT Ventricular Rate:  73 PR  Interval:    QRS Duration: 99 QT Interval:  522 QTC Calculation: 575 R Axis:   19 Text Interpretation:   mostly V-paced complexes occasional native complex  No further analysis attempted due to paced rhythm Confirmed by Wilson Singer  MD,  [place] ([id number]) on 05/05/2016 3:57:56 PM Also confirmed by Wilson Singer  MD,  STEPHEN ([id number]), editor Gilford Rile, CCT, [place] ([id number])  on 05/05/2016 4:06:48  PM      MDM   Final diagnoses:  Generalized weakness    82 year old male with a history of hypertension, BPH, chronic combined systolic and diastolic heart failure, 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  Patient was seen at about her primary care, where reportedly his blood pressures were in the 90s, and he was hypoxic, however there are no recorded vital signs in the chart. On arrival to the emergency department, patient has normal vital signs, normal blood pressure.  Labs show patient is not anemic, no significant electrolyte abnormalities, no sign of urinary tract infection.  CT head without acute abnormalities. No focal neurologic symptoms and exam WNL and doubt CVA.  Troponin negative, no chest pain. Interrogated pacemaker which showed no abnormalities.  Chest XR without pneumonia, no CHF, BNP decreased from prior, no clinical signs on exam.  Unclear etiology for new generalized weakness increasing over last 3 days.  Patient with normal vital signs with hours of observation in the ED.  Feel he is stable for outpatient follow up and evaluation.  Case manager discussed and will obtain home health, PT, OT, given advanced age with decreasing mobility. Patient discharged in stable condition with understanding of reasons to return.     Gareth Morgan, MD 05/06/16 1150

## 2016-05-05 NOTE — Progress Notes (Signed)
EDCM went to speak to patient at bedside.  Patient reports it would be better for [facility] to speak to patient's son who has left the bedside.  Per chart review, patient has home health services with [facility].

## 2016-05-05 NOTE — Progress Notes (Signed)
Pre visit review using our clinic review tool, if applicable. No additional management support is needed unless otherwise documented below in the visit note. 

## 2016-05-05 NOTE — ED Notes (Signed)
Pacer interrogated and transmitted. [place] to call back.

## 2016-05-09 DIAGNOSIS — I110 Hypertensive heart disease with heart failure: Secondary | ICD-10-CM | POA: Diagnosis not present

## 2016-05-09 DIAGNOSIS — I69354 Hemiplegia and hemiparesis following cerebral infarction affecting left non-dominant side: Secondary | ICD-10-CM | POA: Diagnosis not present

## 2016-05-09 DIAGNOSIS — I69322 Dysarthria following cerebral infarction: Secondary | ICD-10-CM | POA: Diagnosis not present

## 2016-05-09 DIAGNOSIS — I480 Paroxysmal atrial fibrillation: Secondary | ICD-10-CM | POA: Diagnosis not present

## 2016-05-09 DIAGNOSIS — I2510 Atherosclerotic heart disease of native coronary artery without angina pectoris: Secondary | ICD-10-CM | POA: Diagnosis not present

## 2016-05-11 ENCOUNTER — Other Ambulatory Visit: Payer: Self-pay | Admitting: Family Medicine

## 2016-05-12 DIAGNOSIS — I2510 Atherosclerotic heart disease of native coronary artery without angina pectoris: Secondary | ICD-10-CM | POA: Diagnosis not present

## 2016-05-12 DIAGNOSIS — I110 Hypertensive heart disease with heart failure: Secondary | ICD-10-CM | POA: Diagnosis not present

## 2016-05-12 DIAGNOSIS — I69354 Hemiplegia and hemiparesis following cerebral infarction affecting left non-dominant side: Secondary | ICD-10-CM | POA: Diagnosis not present

## 2016-05-12 DIAGNOSIS — I480 Paroxysmal atrial fibrillation: Secondary | ICD-10-CM | POA: Diagnosis not present

## 2016-05-12 DIAGNOSIS — I69322 Dysarthria following cerebral infarction: Secondary | ICD-10-CM | POA: Diagnosis not present

## 2016-05-16 DIAGNOSIS — I110 Hypertensive heart disease with heart failure: Secondary | ICD-10-CM | POA: Diagnosis not present

## 2016-05-16 DIAGNOSIS — I69354 Hemiplegia and hemiparesis following cerebral infarction affecting left non-dominant side: Secondary | ICD-10-CM | POA: Diagnosis not present

## 2016-05-16 DIAGNOSIS — I480 Paroxysmal atrial fibrillation: Secondary | ICD-10-CM | POA: Diagnosis not present

## 2016-05-16 DIAGNOSIS — I2510 Atherosclerotic heart disease of native coronary artery without angina pectoris: Secondary | ICD-10-CM | POA: Diagnosis not present

## 2016-05-16 DIAGNOSIS — I69322 Dysarthria following cerebral infarction: Secondary | ICD-10-CM | POA: Diagnosis not present

## 2016-05-16 LAB — CUP PACEART INCLINIC DEVICE CHECK
Battery Voltage: 3.01 V
Date Time Interrogation Session: 20170502145807
Implantable Lead Implant Date: 20170119
Lead Channel Pacing Threshold Amplitude: 0.5 V
Lead Channel Pacing Threshold Amplitude: 0.5 V
Lead Channel Pacing Threshold Pulse Width: 0.4 ms
Lead Channel Setting Pacing Pulse Width: 0.4 ms
Lead Channel Setting Sensing Sensitivity: 2 mV
MDC IDC LEAD LOCATION: 753860
MDC IDC MSMT BATTERY REMAINING LONGEVITY: 124.8
MDC IDC MSMT LEADCHNL RV IMPEDANCE VALUE: 462.5 Ohm
MDC IDC MSMT LEADCHNL RV PACING THRESHOLD PULSEWIDTH: 0.4 ms
MDC IDC MSMT LEADCHNL RV SENSING INTR AMPL: 12 mV
MDC IDC SET LEADCHNL RV PACING AMPLITUDE: 0.75 V
MDC IDC STAT BRADY RV PERCENT PACED: 88 %
Pulse Gen Model: 1240
Pulse Gen Serial Number: 7822433

## 2016-05-18 DIAGNOSIS — I6340 Cerebral infarction due to embolism of unspecified cerebral artery: Secondary | ICD-10-CM | POA: Diagnosis not present

## 2016-05-19 DIAGNOSIS — I110 Hypertensive heart disease with heart failure: Secondary | ICD-10-CM | POA: Diagnosis not present

## 2016-05-19 DIAGNOSIS — I69354 Hemiplegia and hemiparesis following cerebral infarction affecting left non-dominant side: Secondary | ICD-10-CM | POA: Diagnosis not present

## 2016-05-19 DIAGNOSIS — I480 Paroxysmal atrial fibrillation: Secondary | ICD-10-CM | POA: Diagnosis not present

## 2016-05-19 DIAGNOSIS — I2510 Atherosclerotic heart disease of native coronary artery without angina pectoris: Secondary | ICD-10-CM | POA: Diagnosis not present

## 2016-05-19 DIAGNOSIS — I69322 Dysarthria following cerebral infarction: Secondary | ICD-10-CM | POA: Diagnosis not present

## 2016-05-24 ENCOUNTER — Encounter: Payer: Self-pay | Admitting: Cardiovascular Disease

## 2016-06-06 ENCOUNTER — Telehealth: Payer: Self-pay | Admitting: Cardiovascular Disease

## 2016-06-08 ENCOUNTER — Encounter: Payer: Commercial Managed Care - HMO | Admitting: Cardiovascular Disease

## 2016-06-08 NOTE — Telephone Encounter (Signed)
Closed encounter °

## 2016-06-09 ENCOUNTER — Telehealth: Payer: Self-pay | Admitting: *Deleted

## 2016-06-09 MED ORDER — CARVEDILOL 3.125 MG PO TABS: 6.2500 mg | ORAL_TABLET | Freq: Two times a day (BID) | ORAL | Status: DC

## 2016-06-09 NOTE — Telephone Encounter (Signed)
Patients daughter in law, Inoke Vangorden called and requested a ref[facility] [place]. She stated that the patient has been taking 6.25 mg bid per Dr [name]. His chart has 3.25 mg bid listed. Denice Paradise can be reached at [phone]. Thanks, MI

## 2016-06-09 NOTE — Telephone Encounter (Signed)
Returned call to DIL with MD med instructions Rx was sent in for coreg 3.125mg  - take 2 tabs PO BID

## 2016-06-09 NOTE — Telephone Encounter (Signed)
Ok to stay on the 6.25 dose. Please send Rx for updated dose.

## 2016-06-09 NOTE — Telephone Encounter (Signed)
Returned call to patient's DIL Denice Paradise.  Patient has been taking coreg 6.25mg  BID for a few months - since he came home from [facility] Informed her that our records indicate he should be taking coreg 3.125mg  twice daily per last MD visit note and last hospital discharge summary  She said she has been doubling up on his 3.125mg  tablets and he is about out Informed her i would send in an Rx for 3.125mg  tablets with enough to take 2 twice daily if MD would like him to remain on this higher dose and I would send the message to Dr. Loletha Grayer for clarification and she would be notified accordingly.

## 2016-06-18 DIAGNOSIS — I6340 Cerebral infarction due to embolism of unspecified cerebral artery: Secondary | ICD-10-CM | POA: Diagnosis not present

## 2016-06-24 ENCOUNTER — Other Ambulatory Visit: Payer: Self-pay | Admitting: Cardiovascular Disease

## 2016-07-03 ENCOUNTER — Other Ambulatory Visit: Payer: Self-pay | Admitting: Cardiovascular Disease

## 2016-07-03 MED ORDER — FUROSEMIDE 20 MG PO TABS: ORAL_TABLET | ORAL | Status: DC

## 2016-07-03 NOTE — Telephone Encounter (Signed)
Rx request sent to pharmacy.  

## 2016-07-07 ENCOUNTER — Encounter: Payer: Self-pay | Admitting: Adult Health

## 2016-07-07 ENCOUNTER — Ambulatory Visit (INDEPENDENT_AMBULATORY_CARE_PROVIDER_SITE_OTHER): Payer: Commercial Managed Care - HMO | Admitting: Adult Health

## 2016-07-07 VITALS — BP 104/72 | Temp 98.1°F | Wt 162.4 lb

## 2016-07-07 DIAGNOSIS — L259 Unspecified contact dermatitis, unspecified cause: Secondary | ICD-10-CM | POA: Diagnosis not present

## 2016-07-07 DIAGNOSIS — T300 Burn of unspecified body region, unspecified degree: Secondary | ICD-10-CM

## 2016-07-07 DIAGNOSIS — IMO0002 Reserved for concepts with insufficient information to code with codable children: Secondary | ICD-10-CM

## 2016-07-07 MED ORDER — METHYLPREDNISOLONE ACETATE 80 MG/ML IJ SUSP
80.0000 mg | Freq: Once | INTRAMUSCULAR | Status: AC
  Administered 2016-07-07: 80 mg via INTRAMUSCULAR

## 2016-07-07 MED ORDER — CEPHALEXIN 500 MG PO CAPS: 500.0000 mg | ORAL_CAPSULE | Freq: Three times a day (TID) | ORAL | Status: DC
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
## 2016-07-14 ENCOUNTER — Encounter: Payer: Self-pay | Admitting: Family Medicine

## 2016-07-14 ENCOUNTER — Ambulatory Visit (INDEPENDENT_AMBULATORY_CARE_PROVIDER_SITE_OTHER): Payer: Commercial Managed Care - HMO | Admitting: Family Medicine

## 2016-07-14 VITALS — BP 100/70 | HR 77 | Ht 70.0 in | Wt 160.5 lb

## 2016-07-14 DIAGNOSIS — R21 Rash and other nonspecific skin eruption: Secondary | ICD-10-CM | POA: Diagnosis not present

## 2016-07-14 DIAGNOSIS — T24212D Burn of second degree of left thigh, subsequent encounter: Secondary | ICD-10-CM | POA: Diagnosis not present

## 2016-07-14 MED ORDER — TRIAMCINOLONE ACETONIDE 0.1 % EX CREA: TOPICAL_CREAM | CUTANEOUS | Status: DC
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
## 2016-07-14 NOTE — Progress Notes (Signed)
Pre visit review using our clinic review tool, if applicable. No additional management support is needed unless otherwise documented below in the visit note. 

## 2016-07-14 NOTE — Patient Instructions (Signed)
Follow up for any signs of infection such as fever, increased redness, or increased warmth.

## 2016-07-18 ENCOUNTER — Telehealth: Payer: Self-pay | Admitting: Emergency Medicine

## 2016-07-18 ENCOUNTER — Telehealth: Payer: Self-pay | Admitting: Family Medicine

## 2016-07-18 DIAGNOSIS — I6340 Cerebral infarction due to embolism of unspecified cerebral artery: Secondary | ICD-10-CM | POA: Diagnosis not present

## 2016-07-18 MED ORDER — SERTRALINE HCL 100 MG PO TABS: ORAL_TABLET | ORAL | 3 refills | Status: DC

## 2016-07-18 MED ORDER — ALPRAZOLAM 0.5 MG PO TABS: 0.5000 mg | ORAL_TABLET | Freq: Two times a day (BID) | ORAL | 1 refills | Status: DC | PRN

## 2016-07-18 NOTE — Telephone Encounter (Signed)
Medication was verbally called in for pt. Last refill was 06/16/16. It was recorded on medication list incorrectly.

## 2016-07-18 NOTE — Telephone Encounter (Signed)
Error

## 2016-07-18 NOTE — Telephone Encounter (Signed)
Pt request refill  ALPRAZolam (XANAX) 0.5 MG tablet sertraline (ZOLOFT) 100 MG tablet  [facility]

## 2016-07-18 NOTE — Telephone Encounter (Signed)
Due for Alprazolam refill on 07/21/16.

## 2016-07-21 ENCOUNTER — Telehealth: Payer: Self-pay | Admitting: Family Medicine

## 2016-07-21 NOTE — Telephone Encounter (Signed)
[id number] [place], [place] - [phone] (Phone) [phone] (Fax)   Patient is needing triamcinolone cream (KENALOG) 0.1 % [facility]

## 2016-07-21 NOTE — Telephone Encounter (Signed)
Medication sent in for patient. 

## 2016-07-27 ENCOUNTER — Telehealth: Payer: Self-pay | Admitting: Family Medicine

## 2016-07-27 MED ORDER — ALPRAZOLAM 0.5 MG PO TABS: 0.5000 mg | ORAL_TABLET | Freq: Two times a day (BID) | ORAL | 3 refills | Status: DC | PRN

## 2016-07-27 NOTE — Telephone Encounter (Signed)
Please refill.

## 2016-07-27 NOTE — Telephone Encounter (Signed)
Please advise.  Patient usually gets 60 tabs and takes BID.  Prescription was only for 30.

## 2016-07-27 NOTE — Telephone Encounter (Signed)
rx should be for #60 and can give 3 refills.

## 2016-07-27 NOTE — Telephone Encounter (Signed)
I called the pharmacy and spoke with Nira Conn and she stated since the pt has picked up the Rx from 7/25, we can call in the Rx for #60 and they will hold this on file and the pt can get this when he is due. Rx refill was given verbally with Nira Conn and I called the pts son Gene and left a detailed message with this information.

## 2016-07-27 NOTE — Telephone Encounter (Signed)
Patient Name: AUGUSTINO LEARD  DOB: 05/13/1920    Initial Comment Caller states father in law takes Xanax .5mg  bid, asking if rx can be changed   Nurse Assessment  Nurse: Mallie Mussel, RN, Alveta Heimlich Date/Time Eilene Ghazi Time): 07/27/2016 9:19:22 AM  Confirm and document reason for call. If symptomatic, describe symptoms. You must click the next button to save text entered. ---Caller states that she sets up her father-in-law's medications. Dr. Elease Hashimoto had been prescribing him Xanax 0.5mg  BID for years. He usually orders 60 at a time, but this last bottle was only for 30. The instructions are still BID PRN. He does take it twice a day. Wants to know if the quantity ordered can go back to 60 like before. Advised her that I will forward this information to the office and someone will be calling her back. She verbalized understanding. She is not with her father-in-law at present.  Has the patient traveled out of the country within the last 30 days? ---Not Applicable  Does the patient have any new or worsening symptoms? ---No     Guidelines    Guideline Title Affirmed Question Affirmed Notes       Final Disposition User

## 2016-07-30 ENCOUNTER — Other Ambulatory Visit: Payer: Self-pay | Admitting: Family Medicine

## 2016-08-02 ENCOUNTER — Ambulatory Visit (INDEPENDENT_AMBULATORY_CARE_PROVIDER_SITE_OTHER): Payer: Commercial Managed Care - HMO | Admitting: Neurology

## 2016-08-02 ENCOUNTER — Other Ambulatory Visit: Payer: Self-pay

## 2016-08-02 ENCOUNTER — Encounter: Payer: Self-pay | Admitting: Neurology

## 2016-08-02 VITALS — BP 125/75 | HR 81 | Ht 70.0 in | Wt 160.8 lb

## 2016-08-02 DIAGNOSIS — Z7901 Long term (current) use of anticoagulants: Secondary | ICD-10-CM

## 2016-08-02 DIAGNOSIS — Z950 Presence of cardiac pacemaker: Secondary | ICD-10-CM

## 2016-08-02 DIAGNOSIS — I480 Paroxysmal atrial fibrillation: Secondary | ICD-10-CM | POA: Diagnosis not present

## 2016-08-02 DIAGNOSIS — Z8673 Personal history of transient ischemic attack (TIA), and cerebral infarction without residual deficits: Secondary | ICD-10-CM | POA: Diagnosis not present

## 2016-08-02 DIAGNOSIS — I63411 Cerebral infarction due to embolism of right middle cerebral artery: Secondary | ICD-10-CM

## 2016-08-02 NOTE — Progress Notes (Signed)
STROKE NEUROLOGY FOLLOW UP NOTE  NAME: Micheal Nicholson DOB: 1920/08/05  REASON FOR VISIT: stroke follow up HISTORY FROM: son and chart  Today we had the pleasure of seeing Micheal Nicholson in follow-up at our [facility]rology Clinic. Pt was accompanied by son.   History Summary Micheal Nicholson is a 82 y.o. male with history of atrial fibrillation on Eliquis, hypertension, SSS s/p pacemaker and CHF presenting on 03/02/16 with left hemiplegia. CTA head and neck suspicious for right MCA cortical infarcts, and high grade stenosis right V2, and old left PCA infarct. Pt stated compliance with eliquis. TTE with EF 25-30% down from 35-40% in 12/2015. CT chest showed nonspecific lymphademopthy, malignant vs. Reactive lympha nodes. LDL 84 and A1C 5.7. His eliquis changed to coumadin with INR goal 2.5-3.5. He was also put on lipitor 5mg  on discharge to CIR.   04/27/16 follow up - the patient has been doing well. He stayed in CIR for 3 weeks and then discharged home. He takes coumadin at home and compliant as per son, however, his INR ranging from 1.9 to 2.4. He followed with Dr. [name] and concerned about coumadin with subtherapeutic INR. Also followed with PCP and will repeat pan CT in 3-4 weeks. Pt has no specific complains. BP 121/64  Interval History During the interval time, pt has been doing well. Has been following with PCP and will follow up with cardiology later this month. He is back on eliquis now and no bleeding side effect. He was diagnosed with RLS and put on mirapex and was effective. He complains of sometimes both leg and knee give out and he fell, but not frequent. He walks with cane but has walker at home. I asked him to use walker more. BP today 125/75. Son would like to hold off further work up with lymphadenopathy as even if find to have malignancy, will not want aggressive treatment. I agree. Pt complains of recent itchiness, will need to rule out medication related.    REVIEW OF SYSTEMS: Full 14 system review of systems performed and notable only for those listed below and in HPI above, all others are negative:  Constitutional:   Cardiovascular:  Ear/Nose/Throat:   Skin:  Eyes:   Respiratory:   Gastroitestinal:   Genitourinary:  Hematology/Lymphatic:   Endocrine:  Musculoskeletal:   Allergy/Immunology:   Neurological:   Psychiatric:  Sleep:   The following represents the patient's updated allergies and side effects list: Allergies  Allergen Reactions  . Chocolate Other (See Comments)    Acid reflux    The neurologically relevant items on the patient's problem list were reviewed on today's visit.  Neurologic Examination  A problem focused neurological exam (12 or more points of the single system neurologic examination, vital signs counts as 1 point, cranial nerves count for 8 points) was performed.  Blood pressure 125/75, pulse 81, height 5\' 10"  (1.778 m), weight 160 lb 12.8 oz (72.9 kg).  General - Well nourished, well developed, in no apparent distress.  Ophthalmologic - Fundi not visualized due to noncooperation.  Cardiovascular - irregularly irregular heart rate and rhythm.  Mental Status -  Level of arousal and orientation to time, place, and person were intact. Language including expression, naming, repetition, comprehension was assessed and found intact. Fund of Knowledge was assessed and was impaired.  Cranial Nerves II - XII - II - Visual field intact OU. III, IV, VI - Extraocular movements intact. V - Facial sensation intact bilaterally. VII - Facial movement  intact bilaterally. VIII - Hearing & vestibular intact bilaterally. X - Palate elevates symmetrically. XI - Chin turning & shoulder shrug intact bilaterally. XII - Tongue protrusion intact.  Motor Strength - The patient's strength was 4+/5 in all extremities and pronator drift was absent.  Bulk was normal and fasciculations were absent.   Motor Tone - Muscle  tone was assessed at the neck and appendages and was normal.  Reflexes - The patient's reflexes were 1+ in all extremities and he had no pathological reflexes.  Sensory - Light touch, temperature/pinprick were assessed and were normal.    Coordination - The patient had normal movements in the hands with no ataxia or dysmetria.  Tremor was absent.  Gait and Station - walk with cane, steady, slightly slow gait and stooped posturing.     Data reviewed: I personally reviewed the images and agree with the radiology interpretations.  Dg Chest 2 View 03/02/2016 Cardiomegaly with central pulmonary vascular congestion and mild bilateral interstitial edema suggesting volume overload/CHF. Suspect some degree of chronic CHF based on multiple prior exams, perhaps slightly worsened today.   Ct Angio Head & Neck W/cm &/or Wo/cm 03/02/2016  1. Positive for right MCA M3 lesions in the middle sylvian division. Negative for emergent large vessel occlusion. No more proximal anterior circulation stenosis.  2. High-grade stenosis right vertebral artery V2 segment, favor due to soft atherosclerotic plaque. Otherwise negative posterior circulation.  3. Abnormal 2 cm AP window lymphadenopathy most suspicious for malignant nodal disease. Recommend follow-up chest CT with IV contrast to evaluate further.  4. Ectatic thoracic aorta and tortuous proximal great vessels.   Ct Chest W Contrast 03/03/2016  Nonspecific lymphadenopathy demonstrated in the AP window with lymph nodes measuring up to 2.5 cm diameter. Malignant or reactive lymph nodes could have this appearance. Atelectasis in the lung bases. Splenic enlargement. Enlarged spleen with enlarged lymph nodes could indicate lymphoma. Complex cystic lesion in the right kidney. Cholelithiasis.   Ct head & Cervical Spine Wo Contrast 03/02/2016 Atrophy with mild small vessel chronic ischemic changes of deep cerebral white matter. Old LEFT occipital/PCA territory  infarct. No acute intracranial abnormalities. Osseous demineralization with degenerative disc and facet disease changes of the cervical spine as above. No acute abnormalities.   CT head 03/03/16 1. No definite evolving cortically based infarct. Suspicion of right peri-Rolandic white matter infarct (series 201, image 24). 2. No acute intracranial hemorrhage or mass effect.  2D echo - Left ventricle: The cavity size was normal. There was mild  concentric hypertrophy. Systolic function was severely reduced.  The estimated ejection fraction was in the range of 25% to 30%.  Diffuse hypokinesis. - Aortic valve: There was mild regurgitation. - Aortic root: The aortic root was normal in size. - Mitral valve: There was mild regurgitation. Valve area by  pressure half-time: 1.08 cm^2. - Left atrium: The atrium was mildly dilated. - Right ventricle: The cavity size was moderately dilated. Wall  thickness was normal. Systolic function was moderately reduced. - Right atrium: The atrium was moderately dilated. - Tricuspid valve: There was moderate-severe regurgitation. - Pulmonary arteries: Systolic pressure was severely increased. PA  peak pressure: 65 mm Hg (S). - Inferior vena cava: The vessel was normal in size. - Pericardium, extracardiac: There was no pericardial effusion  Component     Latest Ref Rng 03/03/2016  Cholesterol     0 - 200 mg/dL 125  Triglycerides     <150 mg/dL 59  HDL Cholesterol     >40  mg/dL 29 (L)  Total CHOL/HDL Ratio      4.3  VLDL     0 - 40 mg/dL 12  LDL (calc)     0 - 99 mg/dL 84  Hemoglobin A1C     4.8 - 5.6 % 5.7 (H)  Mean Plasma Glucose      117    Assessment: As you may recall, he is a 82 y.o. Caucasian male with PMH of afib on Eliquis, HTN, SSS s/p pacemaker and CHF presenting on 03/02/16 with left hemiplegia. CTA head and neck suspicious for right MCA cortical infarcts, and high grade stenosis right V2, and old left PCA infarct. Pt stated  compliance with eliquis. TTE with EF 25-30% down from 35-40% in 12/2015. CT chest showed nonspecific lymphademopthy, malignant vs. Reactive lympha nodes. LDL 84 and A1C 5.7. His eliquis changed to coumadin with INR goal 2.5-3.5. He was also put on lipitor 5mg  on discharge to CIR. During the interval time, the patient has been doing well. However, his INR ranging from 1.9 to 2.4. Due to difficulty INR management, changed back to eliquis and tolerating well. Was diagnosed with RLS and on mirapex. Son would like to hold off further work up with lymphadenopathy as even if find to have malignancy, will not want aggressive treatment. I agree. Pt complains of recent itchiness, will need to rule out medication related.   Plan:  - continue eliquis and lipitor for stroke prevention - follow up with cardiology for CHF and afib and pacemaker - Follow up with your primary care physician for stroke risk factor modification. Recommend maintain blood pressure goal <130/80, diabetes with hemoglobin A1c goal below 6.5% and lipids with LDL cholesterol goal below 70 mg/dL.  - check BP at home and record - avoid fall, may use walker more - check medication starting date and try to correlate with rash and discuss with PCP - follow up in 6 months.  I spent more than 25 minutes of face to face time with the patient. Greater than 50% of time was spent in counseling and coordination of care. We discussed follow up with cardiology and PCP, hold off further CT chest, abd/pelvis and work with PCP for itchiness.    No orders of the defined types were placed in this encounter.   No orders of the defined types were placed in this encounter.   Patient Instructions  - continue eliquis and lipitor for stroke prevention - follow up with cardiology for CHF and afib and pacemaker - hold off repeat CT chest, abdomen and pelvis for now - Follow up with your primary care physician for stroke risk factor modification. Recommend  maintain blood pressure goal <130/80, diabetes with hemoglobin A1c goal below 6.5% and lipids with LDL cholesterol goal below 70 mg/dL.  - check BP at home and record - avoid fall, may use walker more - check medication starting date and try to correlate with rash and discuss with PCP - follow up in 6 months.   Rosalin Hawking, MD PhD [facility] [address] [place], [place] [address] [phone]

## 2016-08-02 NOTE — Telephone Encounter (Signed)
New Message  Refill-Lasix-Pt refill is incorrect and he takes the 40mg  once per day and not the 20mg  Monday, Wednesday, & Friday. [facility], [place], [place]

## 2016-08-02 NOTE — Patient Instructions (Addendum)
-   continue eliquis and lipitor for stroke prevention - follow up with cardiology for CHF and afib and pacemaker - hold off repeat CT chest, abdomen and pelvis for now - Follow up with your primary care physician for stroke risk factor modification. Recommend maintain blood pressure goal <130/80, diabetes with hemoglobin A1c goal below 6.5% and lipids with LDL cholesterol goal below 70 mg/dL.  - check BP at home and record - avoid fall, may use walker more - check medication starting date and try to correlate with rash and discuss with PCP - follow up in 6 months.

## 2016-08-03 MED ORDER — FUROSEMIDE 20 MG PO TABS: ORAL_TABLET | ORAL | 3 refills | Status: DC

## 2016-08-03 NOTE — Telephone Encounter (Signed)
Rx request sent to pharmacy.  

## 2016-08-08 ENCOUNTER — Encounter: Payer: Self-pay | Admitting: Cardiovascular Disease

## 2016-08-08 ENCOUNTER — Ambulatory Visit (INDEPENDENT_AMBULATORY_CARE_PROVIDER_SITE_OTHER): Payer: Commercial Managed Care - HMO | Admitting: Cardiovascular Disease

## 2016-08-08 VITALS — BP 104/56 | HR 60 | Ht 70.0 in | Wt 167.4 lb

## 2016-08-08 DIAGNOSIS — G8194 Hemiplegia, unspecified affecting left nondominant side: Secondary | ICD-10-CM

## 2016-08-08 DIAGNOSIS — I10 Essential (primary) hypertension: Secondary | ICD-10-CM | POA: Diagnosis not present

## 2016-08-08 DIAGNOSIS — Z7901 Long term (current) use of anticoagulants: Secondary | ICD-10-CM

## 2016-08-08 DIAGNOSIS — Z950 Presence of cardiac pacemaker: Secondary | ICD-10-CM

## 2016-08-08 DIAGNOSIS — I4891 Unspecified atrial fibrillation: Secondary | ICD-10-CM

## 2016-08-08 DIAGNOSIS — L299 Pruritus, unspecified: Secondary | ICD-10-CM

## 2016-08-08 DIAGNOSIS — I5042 Chronic combined systolic (congestive) and diastolic (congestive) heart failure: Secondary | ICD-10-CM

## 2016-08-08 DIAGNOSIS — I441 Atrioventricular block, second degree: Secondary | ICD-10-CM

## 2016-08-08 DIAGNOSIS — I2510 Atherosclerotic heart disease of native coronary artery without angina pectoris: Secondary | ICD-10-CM | POA: Diagnosis not present

## 2016-08-08 LAB — CUP PACEART INCLINIC DEVICE CHECK
Battery Remaining Longevity: 129 mo
Battery Remaining Percentage: 95 %
Battery Voltage: 3.01 V
Brady Statistic RV Percent Paced: 90 %
Date Time Interrogation Session: 20170815101046
Implantable Lead Implant Date: 20170119
MDC IDC LEAD LOCATION: 753860
MDC IDC MSMT LEADCHNL RV IMPEDANCE VALUE: 480 Ohm
MDC IDC MSMT LEADCHNL RV PACING THRESHOLD AMPLITUDE: 0.5 V
MDC IDC MSMT LEADCHNL RV PACING THRESHOLD PULSEWIDTH: 0.4 ms
MDC IDC MSMT LEADCHNL RV SENSING INTR AMPL: 12 mV
MDC IDC PG SERIAL: 7822433

## 2016-08-08 NOTE — Patient Instructions (Signed)
Dr [name] has recommended making the following medication changes: 1. STOP Lisinopril  Dr [name] recommends that you schedule a follow-up appointment in 3 months with a pacemaker check.  If you need a refill on your cardiac medications before your next appointment, please call your pharmacy.

## 2016-08-08 NOTE — Progress Notes (Signed)
Patient ID: Micheal Nicholson, male   DOB: August 21, 1920, 82 y.o.   MRN: [id number]    Cardiology Office Note    Date:  08/09/2016   ID:  Zameer, Awwad 03-22-1920, MRN ID:[id number]  PCP:  [name], MD  Cardiologist:   Sanda Klein, MD   Chief Complaint  Patient presents with  . Follow-up    pacer check, no chest pain, has pain in legs and hips, occassional dizziness when bending over, has a itch that flames up at night    History of Present Illness:  Micheal Nicholson is a 82 y.o. male with persistent atrial fibrillation and residual left hemiparesis due to embolic stroke, currently on treatment with Eliquis. He has a long-standing history of conduction abnormalities including trifascicular block and symptomatic sinus bradycardia for which she received a [employer] single-chamber permanent pacemaker in January 2017 (Dr. Rayann Heman).  Prior to atrial fibrillation pacemaker implantation he had cardiomyopathy of unknown etiology. He had an ejection fraction of around 45% in 2006 and only had some minor stenoses in the proximal to mid LAD artery. In 2013 his ejection fraction was described also at 45% with inferior wall hypokinesis and mild right ventricular dilatation. In 2015, when atrial fibrillation was diagnosed, his echo showed an ejection fraction of 30-35%. During this recent hospitalization for stroke his ejection fraction was estimated to be 25-30 %. His most recent nuclear stress test showed a small apical inferolateral fixed defect without ischemia, much too small to explain his cardiomyopathy.  Pacemaker interrogation shows 90% ventricular pacing with a generator that is still at beginning of life and excellent lead parameters.  He has complaints of generalized itching/stinging that often wakes him from sleep. Is not clear whether he actually had urticaria. He has been on an ACE inhibitor for several years. Unclear if the itching/stinging started when he began  treatment with Eliquis.  He complains of generalized weakness and especially weak legs. Sometimes his legs just "give out" and he falls. He usually is able to lower himself down to his backside and has not fallen on his head. On the other hand a couple weeks ago he was leaning over towards his nightstand and fell and hit his forehead. Thankfully he only had a minor scalp injury. He still enjoys riding on his lawnmower He has not had any serious bleeding problems. He denies exertional dyspnea and has never complained of angina pectoralis. He does have bilateral ankle swelling, but it is less prominent than at previous visits.   CT of the chest in March raised concern for nonspecific lymphadenopathy and splenomegaly in the aortopulmonary window. Workup deferred in view of his advanced age.  Past Medical History:  Diagnosis Date  . Anxiety   . Arthritis   . BPH (benign prostatic hyperplasia)   . CHF (congestive heart failure) ([place])   . Chronic combined systolic and diastolic CHF, NYHA class 2 ([place]) 04/17/2015  . GERD (gastroesophageal reflux disease)   . Hypertension   . Stroke ([place])   . Symptomatic sinus bradycardia    PPM Dr. Rayann Heman, 01/13/16, STJ device    Past Surgical History:  Procedure Laterality Date  . CARDIAC CATHETERIZATION  2006   Negative / 4 years ago  . EP IMPLANTABLE DEVICE N/A 01/13/2016   Procedure: Pacemaker Implant;  Surgeon: Thompson Grayer, MD;  Location: [facility];  Service: Cardiovascular;  Laterality: N/A;    Current Medications: Outpatient Medications Prior to Visit  Medication Sig Dispense Refill  .  ALPRAZolam (XANAX) 0.5 MG tablet Take 1 tablet (0.5 mg total) by mouth 2 (two) times daily as needed for anxiety. 60 tablet 3  . apixaban (ELIQUIS) 5 MG TABS tablet Take 1 tablet (5 mg total) by mouth 2 (two) times daily. 60 tablet 5  . atorvastatin (LIPITOR) 10 MG tablet Take 0.5 tablets (5 mg total) by mouth daily at 6 PM. 30 tablet 0  .  Camphor-Eucalyptus-Menthol (VICKS VAPORUB EX) Apply 1 application topically daily as needed (to help breathe).     . carvedilol (COREG) 3.125 MG tablet Take 2 tablets (6.25 mg total) by mouth 2 (two) times daily with a meal. 120 tablet 5  . cephALEXin (KEFLEX) 500 MG capsule Take 1 capsule (500 mg total) by mouth 3 (three) times daily. 21 capsule 0  . dutasteride (AVODART) 0.5 MG capsule Take 1 capsule (0.5 mg total) by mouth daily. 90 capsule 1  . fluticasone (FLONASE) 50 MCG/ACT nasal spray Place 2 sprays into both nostrils 2 (two) times daily.     . furosemide (LASIX) 20 MG tablet TAKE 1 TALBET BY MOUTH DAILY ON MONDAYS, WEDNESDAYS, AND FRIDAYS 90 tablet 3  . gabapentin (NEURONTIN) 100 MG capsule TAKE 2 CAPSULES 3 TIMES DAILY 180 capsule 5  . guaiFENesin (MUCINEX) 600 MG 12 hr tablet Take 1 tablet (600 mg total) by mouth 2 (two) times daily. 30 tablet 0  . KLOR-CON M10 10 MEQ tablet Take 10 mEq by mouth daily.     Marland Kitchen omeprazole (PRILOSEC) 20 MG capsule TAKE ONE CAPSULE BY MOUTH EVERY DAY (Patient taking differently: TAKE 20 MG BY MOUTH EVERY DAY) 30 capsule 5  . polyethylene glycol (MIRALAX / GLYCOLAX) packet Take 17 g by mouth daily. 14 each 0  . pramipexole (MIRAPEX) 0.125 MG tablet TAKE 1 TABLET BY MOUTH AT BEDTIME (Patient taking differently: TAKE 0.125 MG BY MOUTH AT BEDTIME) 30 tablet 5  . senna-docusate (SENOKOT-S) 8.6-50 MG tablet Take 1 tablet by mouth at bedtime as needed for mild constipation. 10 tablet 0  . sertraline (ZOLOFT) 100 MG tablet TAKE 1.5 TABLETS BY MOUTH DAILY 45 tablet 3  . tamsulosin (FLOMAX) 0.4 MG CAPS capsule Take 1 capsule (0.4 mg total) by mouth daily. 90 capsule 1  . lisinopril (PRINIVIL,ZESTRIL) 10 MG tablet Take 1 tablet (10 mg total) by mouth daily. 90 tablet 3  . triamcinolone cream (KENALOG) 0.1 % Compound with Eucerin 1:1 and apply to affected rash bid prn. 454 g 1  . Vitamin D, Ergocalciferol, (DRISDOL) 50000 UNITS CAPS capsule Take 1 capsule (50,000 Units  total) by mouth every 7 (seven) days. 30 capsule 0   No facility-administered medications prior to visit.      Allergies:   Chocolate   Social History   Social History  . Marital status: Widowed    Spouse name: N/A  . Number of children: N/A  . Years of education: N/A   Social History Main Topics  . Smoking status: Former Smoker    Packs/day: 0.50    Years: 5.00  . Smokeless tobacco: Never Used  . Alcohol use 0.6 oz/week    1 Shots of liquor per week     Comment: occ  . Drug use: No  . Sexual activity: Not Asked   Other Topics Concern  . None   Social History Narrative  . None     Family History:  The patient's family history includes Healthy in his son; Heart disease in his brother; Heart failure in his son; Lung cancer  in his brother; Pancreatic cancer in his son; Pneumonia (age of onset: 43) in his father.   ROS:   Please see the history of present illness.    ROS All other systems reviewed and are negative.   PHYSICAL EXAM:   VS:  BP (!) 104/56 (BP Location: Left Arm, Patient Position: Sitting, Cuff Size: Normal)   Pulse 60   Ht 5\' 10"  (1.778 m)   Wt 167 lb 6 oz (75.9 kg)   BMI 24.02 kg/m    GEN: Well nourished, well developed, in no acute distress  HEENT: normal  Neck: no JVD, carotid bruits, or masses Cardiac: Paradoxically split second heart sound, RRR; no murmurs, rubs, or gallops,no edema  Respiratory:  clear to auscultation bilaterally, normal work of breathing GI: soft, nontender, nondistended, + BS MS: no deformity or atrophy  Skin: warm and dry, no rash Neuro:  Alert and Oriented x 3, 4/5 strength in left upper extremity, no discernible difference in strength in the lower extremities. He seems to have a receptive aphasia, but claims he can understand "90% of what I'm saying" Psych: euthymic mood, full affect  Wt Readings from Last 3 Encounters:  08/08/16 167 lb 6 oz (75.9 kg)  08/02/16 160 lb 12.8 oz (72.9 kg)  07/14/16 160 lb 8 oz (72.8 kg)        Studies/Labs Reviewed:   EKG:  EKG is not ordered today.  The most recent ecg demonstrates atrial fibrillation, ventricular pacing  Recent Labs: 12/29/2015: Magnesium 2.5; TSH 0.542 03/02/2016: ALT 12 05/05/2016: B Natriuretic Peptide 542.1; BUN 26; Creatinine, Ser 1.30; Hemoglobin 11.4; Platelets 151; Potassium 4.3; Sodium 138   Lipid Panel    Component Value Date/Time   CHOL 125 03/03/2016 0633   TRIG 59 03/03/2016 0633   HDL 29 (L) 03/03/2016 0633   CHOLHDL 4.3 03/03/2016 0633   VLDL 12 03/03/2016 0633   LDLCALC 84 03/03/2016 0633    ASSESSMENT:    1. Atrial fibrillation with slow ventricular response ([place])   2. Second degree Mobitz II AV block   3. Cardiac pacemaker in situ   4. Essential hypertension   5. Coronary artery disease involving native coronary artery of native heart without angina pectoris   6. Chronic combined systolic and diastolic CHF, NYHA class 2 ([place])   7. Pruritus   8. Left hemiparesis ([place])   9. Long term current use of anticoagulant therapy      PLAN:  In order of problems listed above:  1. AFib: Despite the virtual absence of AV blocking agents he requires 90% ventricular pacing (he is on a minimum dose of carvedilol). On Eliquis. CHADSVasc 7 (age 78, CVA 2, CAD, HTN, CHF) 2. AV block: Requires frequent pacing. Prior to permanent atrial fibrillation he had evidence of trifascicular block. Unfortunately, ventricular pacing may be quite deleterious in the setting of depressed left ventricular systolic function. Electrophysiology evaluation was performed and the decision was made to proceed with single chamber pacing January. 3. PPM: Normal function of single lead pacemaker. Remote download 3 months, at least yearly office visit. 4. HTN: Excellent blood pressure control. We'll take off his ACE inhibitor to see if there is improvement in his pruritus/rash 5. CAD: He has always had minor abnormalities on/nuclear studies and has never complained of  angina pectoris. His cardiac myopathy appears to be nonischemic 6. CHF: He has swollen ankles, but otherwise no clear evidence for hypervolemia. His weight is a little higher compared to January. He denies shortness of  breath (albeit with very sedentary lifestyle). I would avoid increasing his beta blocker.  7. Unclear whether he may have ACE inhibitor related urticaria. Will stop the ACE inhibitor and see if there is reduction in his pruritus and rash. If this doesn't lead to improvement may need to stop his other medications one by one. He has been on and off and on again on the anticoagulant. Hard to correlate the timeline with his rash. Might even have B symptoms associated with lymphoma. 8. Left hemiparesis, but able to walk with a cane 9. He reports compliance with his anticoagulant. Has not had serious bleeding problems. Has had 1 fall with head injury but according to his son is usually pretty steady and balanced. Need to constantly reassess the risk of embolic stroke (which is very high) versus the risk of serious bleeding complications (which will only increase as he gets older and more frail.    Medication Adjustments/Labs and Tests Ordered: Current medicines are reviewed at length with the patient today.  Concerns regarding medicines are outlined above.  Medication changes, Labs and Tests ordered today are listed in the Patient Instructions below. Patient Instructions  Dr [name] has recommended making the following medication changes: 1. STOP Lisinopril  Dr [name] recommends that you schedule a follow-up appointment in 3 months with a pacemaker check.  If you need a refill on your cardiac medications before your next appointment, please call your pharmacy.    Signed, Sanda Klein, MD  08/09/2016 1:18 PM    [facility] [place], [place], [place]  [address] Phone: [phone]; Fax: [phone]

## 2016-08-09 DIAGNOSIS — L299 Pruritus, unspecified: Secondary | ICD-10-CM | POA: Insufficient documentation

## 2016-08-11 ENCOUNTER — Encounter: Payer: Self-pay | Admitting: Cardiovascular Disease

## 2016-08-18 ENCOUNTER — Telehealth: Payer: Self-pay

## 2016-08-18 DIAGNOSIS — I6340 Cerebral infarction due to embolism of unspecified cerebral artery: Secondary | ICD-10-CM | POA: Diagnosis not present

## 2016-08-18 MED ORDER — FUROSEMIDE 40 MG PO TABS: 40.0000 mg | ORAL_TABLET | Freq: Every day | ORAL | 3 refills | Status: DC

## 2016-08-18 NOTE — Telephone Encounter (Signed)
Reiceved fax from pharmacy for lasix 40mg  QD; spoke with patients son Gene and his wife confirmed dose was correct. Informed caregiver that chart had something different. Spoke with Dr [name] and he authorize me to change furosemide back to 40mg  qd. Caregiver informed-left message on voicemail, rx sent to pharmacy.

## 2016-08-23 ENCOUNTER — Encounter (HOSPITAL_COMMUNITY): Payer: Self-pay | Admitting: Emergency Medicine

## 2016-08-23 ENCOUNTER — Emergency Department (HOSPITAL_COMMUNITY): Payer: Commercial Managed Care - HMO

## 2016-08-23 ENCOUNTER — Inpatient Hospital Stay (HOSPITAL_COMMUNITY): Payer: Commercial Managed Care - HMO

## 2016-08-23 ENCOUNTER — Ambulatory Visit: Payer: Commercial Managed Care - HMO | Admitting: Family Medicine

## 2016-08-23 ENCOUNTER — Inpatient Hospital Stay (HOSPITAL_COMMUNITY)
Admission: EM | Admit: 2016-08-23 | Discharge: 2016-08-28 | DRG: 193 | Disposition: A | Discharge status: Skilled Nursing Facility | Payer: Commercial Managed Care - HMO | Attending: Family Medicine | Admitting: Family Medicine

## 2016-08-23 ENCOUNTER — Telehealth: Payer: Self-pay | Admitting: Family Medicine

## 2016-08-23 DIAGNOSIS — Z8249 Family history of ischemic heart disease and other diseases of the circulatory system: Secondary | ICD-10-CM

## 2016-08-23 DIAGNOSIS — E876 Hypokalemia: Secondary | ICD-10-CM | POA: Diagnosis not present

## 2016-08-23 DIAGNOSIS — Z950 Presence of cardiac pacemaker: Secondary | ICD-10-CM | POA: Diagnosis not present

## 2016-08-23 DIAGNOSIS — D696 Thrombocytopenia, unspecified: Secondary | ICD-10-CM | POA: Diagnosis not present

## 2016-08-23 DIAGNOSIS — Z79899 Other long term (current) drug therapy: Secondary | ICD-10-CM

## 2016-08-23 DIAGNOSIS — F419 Anxiety disorder, unspecified: Secondary | ICD-10-CM | POA: Diagnosis present

## 2016-08-23 DIAGNOSIS — G8190 Hemiplegia, unspecified affecting unspecified side: Secondary | ICD-10-CM

## 2016-08-23 DIAGNOSIS — I2510 Atherosclerotic heart disease of native coronary artery without angina pectoris: Secondary | ICD-10-CM | POA: Diagnosis present

## 2016-08-23 DIAGNOSIS — I69351 Hemiplegia and hemiparesis following cerebral infarction affecting right dominant side: Secondary | ICD-10-CM

## 2016-08-23 DIAGNOSIS — I5043 Acute on chronic combined systolic (congestive) and diastolic (congestive) heart failure: Secondary | ICD-10-CM | POA: Diagnosis not present

## 2016-08-23 DIAGNOSIS — R4189 Other symptoms and signs involving cognitive functions and awareness: Secondary | ICD-10-CM | POA: Diagnosis present

## 2016-08-23 DIAGNOSIS — N400 Enlarged prostate without lower urinary tract symptoms: Secondary | ICD-10-CM | POA: Diagnosis not present

## 2016-08-23 DIAGNOSIS — I110 Hypertensive heart disease with heart failure: Secondary | ICD-10-CM | POA: Diagnosis present

## 2016-08-23 DIAGNOSIS — R001 Bradycardia, unspecified: Secondary | ICD-10-CM | POA: Diagnosis present

## 2016-08-23 DIAGNOSIS — R404 Transient alteration of awareness: Secondary | ICD-10-CM | POA: Diagnosis not present

## 2016-08-23 DIAGNOSIS — I481 Persistent atrial fibrillation: Secondary | ICD-10-CM | POA: Diagnosis present

## 2016-08-23 DIAGNOSIS — J189 Pneumonia, unspecified organism: Principal | ICD-10-CM | POA: Diagnosis present

## 2016-08-23 DIAGNOSIS — I10 Essential (primary) hypertension: Secondary | ICD-10-CM | POA: Diagnosis present

## 2016-08-23 DIAGNOSIS — I5023 Acute on chronic systolic (congestive) heart failure: Secondary | ICD-10-CM | POA: Diagnosis not present

## 2016-08-23 DIAGNOSIS — R591 Generalized enlarged lymph nodes: Secondary | ICD-10-CM | POA: Diagnosis present

## 2016-08-23 DIAGNOSIS — F0390 Unspecified dementia without behavioral disturbance: Secondary | ICD-10-CM | POA: Diagnosis not present

## 2016-08-23 DIAGNOSIS — R042 Hemoptysis: Secondary | ICD-10-CM

## 2016-08-23 DIAGNOSIS — Z7901 Long term (current) use of anticoagulants: Secondary | ICD-10-CM

## 2016-08-23 DIAGNOSIS — K219 Gastro-esophageal reflux disease without esophagitis: Secondary | ICD-10-CM | POA: Diagnosis present

## 2016-08-23 DIAGNOSIS — I509 Heart failure, unspecified: Secondary | ICD-10-CM | POA: Diagnosis not present

## 2016-08-23 DIAGNOSIS — Z87891 Personal history of nicotine dependence: Secondary | ICD-10-CM

## 2016-08-23 DIAGNOSIS — D649 Anemia, unspecified: Secondary | ICD-10-CM | POA: Diagnosis present

## 2016-08-23 DIAGNOSIS — R0602 Shortness of breath: Secondary | ICD-10-CM | POA: Diagnosis not present

## 2016-08-23 DIAGNOSIS — F331 Major depressive disorder, recurrent, moderate: Secondary | ICD-10-CM | POA: Diagnosis not present

## 2016-08-23 DIAGNOSIS — N179 Acute kidney failure, unspecified: Secondary | ICD-10-CM | POA: Diagnosis present

## 2016-08-23 DIAGNOSIS — I5042 Chronic combined systolic (congestive) and diastolic (congestive) heart failure: Secondary | ICD-10-CM | POA: Diagnosis not present

## 2016-08-23 DIAGNOSIS — J810 Acute pulmonary edema: Secondary | ICD-10-CM

## 2016-08-23 DIAGNOSIS — R05 Cough: Secondary | ICD-10-CM | POA: Diagnosis not present

## 2016-08-23 DIAGNOSIS — I4819 Other persistent atrial fibrillation: Secondary | ICD-10-CM | POA: Diagnosis present

## 2016-08-23 DIAGNOSIS — K920 Hematemesis: Secondary | ICD-10-CM | POA: Diagnosis present

## 2016-08-23 DIAGNOSIS — R41841 Cognitive communication deficit: Secondary | ICD-10-CM | POA: Diagnosis not present

## 2016-08-23 DIAGNOSIS — Z5181 Encounter for therapeutic drug level monitoring: Secondary | ICD-10-CM | POA: Diagnosis not present

## 2016-08-23 DIAGNOSIS — I255 Ischemic cardiomyopathy: Secondary | ICD-10-CM | POA: Diagnosis present

## 2016-08-23 DIAGNOSIS — M6281 Muscle weakness (generalized): Secondary | ICD-10-CM | POA: Diagnosis not present

## 2016-08-23 DIAGNOSIS — R531 Weakness: Secondary | ICD-10-CM | POA: Diagnosis not present

## 2016-08-23 LAB — CBC WITH DIFFERENTIAL/PLATELET
BASOS PCT: 0 %
Basophils Absolute: 0 10*3/uL (ref 0.0–0.1)
EOS ABS: 0 10*3/uL (ref 0.0–0.7)
Eosinophils Relative: 0 %
HEMATOCRIT: 29.9 % — AB (ref 39.0–52.0)
HEMOGLOBIN: 10.3 g/dL — AB (ref 13.0–17.0)
LYMPHS ABS: 0.4 10*3/uL — AB (ref 0.7–4.0)
Lymphocytes Relative: 6 %
MCH: 29.2 pg (ref 26.0–34.0)
MCHC: 34.4 g/dL (ref 30.0–36.0)
MCV: 84.7 fL (ref 78.0–100.0)
MONO ABS: 0.4 10*3/uL (ref 0.1–1.0)
MONOS PCT: 6 %
NEUTROS ABS: 6 10*3/uL (ref 1.7–7.7)
NEUTROS PCT: 88 %
Platelets: 133 10*3/uL — ABNORMAL LOW (ref 150–400)
RBC: 3.53 MIL/uL — ABNORMAL LOW (ref 4.22–5.81)
RDW: 15.2 % (ref 11.5–15.5)
WBC: 6.8 10*3/uL (ref 4.0–10.5)

## 2016-08-23 LAB — I-STAT CHEM 8, ED
BUN: 40 mg/dL — AB (ref 6–20)
CALCIUM ION: 1.12 mmol/L — AB (ref 1.15–1.40)
CREATININE: 1.6 mg/dL — AB (ref 0.61–1.24)
Chloride: 107 mmol/L (ref 101–111)
Glucose, Bld: 145 mg/dL — ABNORMAL HIGH (ref 65–99)
HEMATOCRIT: 31 % — AB (ref 39.0–52.0)
HEMOGLOBIN: 10.5 g/dL — AB (ref 13.0–17.0)
Potassium: 3.8 mmol/L (ref 3.5–5.1)
Sodium: 142 mmol/L (ref 135–145)
TCO2: 21 mmol/L (ref 0–100)

## 2016-08-23 LAB — COMPREHENSIVE METABOLIC PANEL
ALT: 37 U/L (ref 17–63)
AST: 37 U/L (ref 15–41)
Albumin: 4 g/dL (ref 3.5–5.0)
Alkaline Phosphatase: 59 U/L (ref 38–126)
Anion gap: 9 (ref 5–15)
BILIRUBIN TOTAL: 2.7 mg/dL — AB (ref 0.3–1.2)
BUN: 48 mg/dL — ABNORMAL HIGH (ref 6–20)
CHLORIDE: 110 mmol/L (ref 101–111)
CO2: 22 mmol/L (ref 22–32)
CREATININE: 1.67 mg/dL — AB (ref 0.61–1.24)
Calcium: 9.2 mg/dL (ref 8.9–10.3)
GFR, EST AFRICAN AMERICAN: 39 mL/min — AB (ref >60)
GFR, EST NON AFRICAN AMERICAN: 33 mL/min — AB (ref >60)
Glucose, Bld: 150 mg/dL — ABNORMAL HIGH (ref 65–99)
POTASSIUM: 3.8 mmol/L (ref 3.5–5.1)
Sodium: 141 mmol/L (ref 135–145)
TOTAL PROTEIN: 6.9 g/dL (ref 6.5–8.1)

## 2016-08-23 LAB — ECHOCARDIOGRAM COMPLETE
Height: 70 in
Weight: 2599.66 oz

## 2016-08-23 LAB — CBC
HEMATOCRIT: 30 % — AB (ref 39.0–52.0)
HEMOGLOBIN: 10.1 g/dL — AB (ref 13.0–17.0)
MCH: 28.6 pg (ref 26.0–34.0)
MCHC: 33.7 g/dL (ref 30.0–36.0)
MCV: 85 fL (ref 78.0–100.0)
Platelets: 140 10*3/uL — ABNORMAL LOW (ref 150–400)
RBC: 3.53 MIL/uL — AB (ref 4.22–5.81)
RDW: 15.3 % (ref 11.5–15.5)
WBC: 8.3 10*3/uL (ref 4.0–10.5)

## 2016-08-23 LAB — STREP PNEUMONIAE URINARY ANTIGEN: STREP PNEUMO URINARY ANTIGEN: NEGATIVE

## 2016-08-23 LAB — BRAIN NATRIURETIC PEPTIDE: B NATRIURETIC PEPTIDE 5: 1296.9 pg/mL — AB (ref 0.0–100.0)

## 2016-08-23 LAB — PROTIME-INR
INR: 1.57
Prothrombin Time: 18.9 seconds — ABNORMAL HIGH (ref 11.4–15.2)

## 2016-08-23 LAB — PROCALCITONIN: PROCALCITONIN: 0.4 ng/mL

## 2016-08-23 MED ORDER — PRAMIPEXOLE DIHYDROCHLORIDE 0.125 MG PO TABS
0.1250 mg | ORAL_TABLET | Freq: Every day | ORAL | Status: DC
Start: 2016-08-23 — End: 2016-08-28
  Administered 2016-08-23 – 2016-08-27 (×5): 0.125 mg via ORAL
  Filled 2016-08-23 (×6): qty 1

## 2016-08-23 MED ORDER — ACETAMINOPHEN 650 MG RE SUPP: 650.0000 mg | Freq: Four times a day (QID) | RECTAL | Status: DC | PRN

## 2016-08-23 MED ORDER — ALBUTEROL SULFATE (2.5 MG/3ML) 0.083% IN NEBU: 2.5000 mg | INHALATION_SOLUTION | RESPIRATORY_TRACT | Status: DC | PRN

## 2016-08-23 MED ORDER — GUAIFENESIN ER 600 MG PO TB12
600.0000 mg | ORAL_TABLET | Freq: Two times a day (BID) | ORAL | Status: DC
  Administered 2016-08-23 – 2016-08-28 (×11): 600 mg via ORAL
  Filled 2016-08-23 (×11): qty 1

## 2016-08-23 MED ORDER — AZITHROMYCIN 250 MG PO TABS
250.0000 mg | ORAL_TABLET | ORAL | Status: DC
  Administered 2016-08-24 – 2016-08-28 (×5): 250 mg via ORAL
  Filled 2016-08-23 (×7): qty 1

## 2016-08-23 MED ORDER — PANTOPRAZOLE SODIUM 40 MG PO TBEC
40.0000 mg | DELAYED_RELEASE_TABLET | Freq: Every day | ORAL | Status: DC
Start: 2016-08-23 — End: 2016-08-28
  Administered 2016-08-23 – 2016-08-28 (×6): 40 mg via ORAL
  Filled 2016-08-23 (×6): qty 1

## 2016-08-23 MED ORDER — DEXTROSE 5 % IV SOLN
1.0000 g | INTRAVENOUS | Status: DC
  Administered 2016-08-24 – 2016-08-28 (×5): 1 g via INTRAVENOUS
  Filled 2016-08-23 (×5): qty 10

## 2016-08-23 MED ORDER — DEXTROSE 5 % IV SOLN
1.0000 g | Freq: Once | INTRAVENOUS | Status: AC
  Administered 2016-08-23: 1 g via INTRAVENOUS
  Filled 2016-08-23: qty 10

## 2016-08-23 MED ORDER — ACETAMINOPHEN 325 MG PO TABS: 650.0000 mg | ORAL_TABLET | Freq: Four times a day (QID) | ORAL | Status: DC | PRN

## 2016-08-23 MED ORDER — CHLORHEXIDINE GLUCONATE 0.12 % MT SOLN
15.0000 mL | Freq: Two times a day (BID) | OROMUCOSAL | Status: DC
  Administered 2016-08-23 – 2016-08-28 (×11): 15 mL via OROMUCOSAL
  Filled 2016-08-23 (×10): qty 15

## 2016-08-23 MED ORDER — CARVEDILOL 6.25 MG PO TABS
6.2500 mg | ORAL_TABLET | Freq: Two times a day (BID) | ORAL | Status: DC
  Administered 2016-08-23 – 2016-08-28 (×11): 6.25 mg via ORAL
  Filled 2016-08-23 (×11): qty 1

## 2016-08-23 MED ORDER — TAMSULOSIN HCL 0.4 MG PO CAPS
0.4000 mg | ORAL_CAPSULE | Freq: Every day | ORAL | Status: DC
  Administered 2016-08-23 – 2016-08-28 (×6): 0.4 mg via ORAL
  Filled 2016-08-23 (×6): qty 1

## 2016-08-23 MED ORDER — POLYETHYLENE GLYCOL 3350 17 G PO PACK: 17.0000 g | PACK | Freq: Every day | ORAL | Status: DC | PRN

## 2016-08-23 MED ORDER — POTASSIUM CHLORIDE CRYS ER 10 MEQ PO TBCR
10.0000 meq | EXTENDED_RELEASE_TABLET | Freq: Every day | ORAL | Status: DC
  Administered 2016-08-23 – 2016-08-28 (×6): 10 meq via ORAL
  Filled 2016-08-23 (×6): qty 1

## 2016-08-23 MED ORDER — FUROSEMIDE 10 MG/ML IJ SOLN
40.0000 mg | Freq: Once | INTRAMUSCULAR | Status: AC
  Administered 2016-08-23: 40 mg via INTRAVENOUS
  Filled 2016-08-23: qty 4

## 2016-08-23 MED ORDER — GABAPENTIN 100 MG PO CAPS
200.0000 mg | ORAL_CAPSULE | Freq: Three times a day (TID) | ORAL | Status: DC
  Administered 2016-08-23 – 2016-08-28 (×16): 200 mg via ORAL
  Filled 2016-08-23 (×16): qty 2

## 2016-08-23 MED ORDER — ALPRAZOLAM 0.5 MG PO TABS
0.5000 mg | ORAL_TABLET | Freq: Two times a day (BID) | ORAL | Status: DC | PRN
  Administered 2016-08-24 – 2016-08-28 (×6): 0.5 mg via ORAL
  Filled 2016-08-23 (×6): qty 1

## 2016-08-23 MED ORDER — DIPHENHYDRAMINE HCL 25 MG PO CAPS
25.0000 mg | ORAL_CAPSULE | ORAL | Status: DC | PRN
  Administered 2016-08-23 – 2016-08-28 (×7): 25 mg via ORAL
  Filled 2016-08-23 (×10): qty 1

## 2016-08-23 MED ORDER — SENNOSIDES-DOCUSATE SODIUM 8.6-50 MG PO TABS: 1.0000 | ORAL_TABLET | Freq: Every evening | ORAL | Status: DC | PRN

## 2016-08-23 MED ORDER — DEXTROSE 5 % IV SOLN
500.0000 mg | Freq: Once | INTRAVENOUS | Status: AC
  Administered 2016-08-23: 500 mg via INTRAVENOUS
  Filled 2016-08-23: qty 500

## 2016-08-23 MED ORDER — ATORVASTATIN CALCIUM 10 MG PO TABS
5.0000 mg | ORAL_TABLET | Freq: Every day | ORAL | Status: DC
  Administered 2016-08-23 – 2016-08-27 (×5): 5 mg via ORAL
  Filled 2016-08-23 (×6): qty 1

## 2016-08-23 MED ORDER — FUROSEMIDE 10 MG/ML IJ SOLN
40.0000 mg | Freq: Every day | INTRAMUSCULAR | Status: DC
  Administered 2016-08-23 – 2016-08-28 (×6): 40 mg via INTRAVENOUS
  Filled 2016-08-23 (×6): qty 4

## 2016-08-23 MED ORDER — HYDROCERIN EX CREA
TOPICAL_CREAM | Freq: Two times a day (BID) | CUTANEOUS | Status: DC
  Administered 2016-08-23 – 2016-08-28 (×10): via TOPICAL
  Filled 2016-08-23 (×2): qty 113

## 2016-08-23 MED ORDER — DUTASTERIDE 0.5 MG PO CAPS
0.5000 mg | ORAL_CAPSULE | Freq: Every day | ORAL | Status: DC
  Administered 2016-08-23 – 2016-08-28 (×6): 0.5 mg via ORAL
  Filled 2016-08-23 (×6): qty 1

## 2016-08-23 MED ORDER — SERTRALINE HCL 50 MG PO TABS
150.0000 mg | ORAL_TABLET | Freq: Every day | ORAL | Status: DC
  Administered 2016-08-23 – 2016-08-28 (×6): 150 mg via ORAL
  Filled 2016-08-23 (×6): qty 3

## 2016-08-23 MED ORDER — APIXABAN 2.5 MG PO TABS
2.5000 mg | ORAL_TABLET | Freq: Two times a day (BID) | ORAL | Status: DC
  Administered 2016-08-23 – 2016-08-28 (×11): 2.5 mg via ORAL
  Filled 2016-08-23 (×11): qty 1

## 2016-08-23 NOTE — ED Triage Notes (Signed)
Patient BIB [facility] from home for spitting up blood. Patient reported to EMS symptoms have been off and on x6 months and has never been evaluated for it. Patient reported that symptoms stopped for a couple days and returned this am, around [place]. Patient denies pain, and SOB. EMS also reports patient has a small slow healing wound to LT inner thigh from a lawnmower. Patient has HX of stroke and is on eliquis.

## 2016-08-23 NOTE — ED Notes (Signed)
Patient transported to X-ray 

## 2016-08-23 NOTE — Care Management Obs Status (Signed)
[facility] NOTIFICATION   Patient Details  Name: Micheal Nicholson MRN: [id number] Date of Birth: 05-Apr-1920   Medicare Observation Status Notification Given:  Yes    Leeroy Cha, RN 08/23/2016, 10:31 AM

## 2016-08-23 NOTE — ED Notes (Signed)
Bed: [id number] Expected date:  Expected time:  Means of arrival:  Comments: 82 yr old, hemopytsis

## 2016-08-23 NOTE — ED Provider Notes (Signed)
[facility] DEPT Provider Note   CSN: [id number] Arrival date & time: 08/23/16  0225  By signing my name below, I, Micheal Nicholson, attest that this documentation has been prepared under the direction and in the presence of Hether Anselmo, MD . Electronically Signed: Higinio Nicholson, Scribe. 08/23/2016. 2:36 AM.  History   Chief Complaint Chief Complaint  Patient presents with  . Hematemesis   The history is provided by the patient. No language interpreter was used.  Cough  This is a recurrent problem. The current episode started more than 1 week ago. Episode frequency: intermittently. The problem has not changed since onset.The cough is productive of bloody sputum. There has been no fever. Pertinent negatives include no chest pain. He has tried nothing for the symptoms. The treatment provided no relief. His past medical history does not include asthma.   HPI Comments: Micheal Nicholson is a 82 y.o. male with PMHx of BPH, CHF, HTN and stroke, who presents to the Emergency Department complaining of gradually worsening, intermittent, coughing up blood that began 6 months ago and worsened today. Pt reports he has experienced 2-3 episodes of coughing up blood a day for the past 6 months. Per EMS, his episodes stopped for a few days but then returned today which is why he decided to visit the ED.   Past Medical History:  Diagnosis Date  . Anxiety   . Arthritis   . BPH (benign prostatic hyperplasia)   . CHF (congestive heart failure) ([place])   . Chronic combined systolic and diastolic CHF, NYHA class 2 ([place]) 04/17/2015  . GERD (gastroesophageal reflux disease)   . Hypertension   . Stroke ([place])   . Symptomatic sinus bradycardia    PPM Dr. Rayann Heman, 01/13/16, STJ device    Patient Active Problem List   Diagnosis Date Noted  . Pruritus 08/09/2016  . Chronic anticoagulation 04/28/2016  . Cardiac pacemaker in situ 04/28/2016  . Chronic systolic congestive heart failure ([place]) 04/28/2016  . Left  hemiparesis ([place]) 04/25/2016  . Encounter for therapeutic drug monitoring 04/19/2016  . Cardiomyopathy, ischemic   . Cerebral embolism with cerebral infarction 03/03/2016  . Acute blood loss anemia   . Thrombocytopenia ([place])   . Acute left hemiparesis ([place])   . Peripheral neuropathic pain ([place])   . Hemiplegia ([place]) 03/02/2016  . Adenopathy   . Long term current use of anticoagulant therapy 01/23/2016  . CAD (coronary artery disease) 01/23/2016  . Second degree Mobitz II AV block 01/13/2016  . Chronic systolic CHF (congestive heart failure) ([place])   . Persistent atrial fibrillation ([place])   . Atrial fibrillation with slow ventricular response ([place])   . Renal failure   . Bradycardia 12/29/2015  . Acute renal failure (ARF) ([place]) 12/29/2015  . Hyponatremia 12/29/2015  . Hypotension 12/29/2015  . Elevated troponin 12/29/2015  . Acute upper respiratory infection 12/23/2015  . Chronic combined systolic and diastolic CHF, NYHA class 2 ([place]) 04/17/2015  . Vitamin D deficiency 03/11/2015  . Cardiomyopathy -etiology uncertain [id number]  . Paroxysmal atrial fibrillation ([place]) 08/27/2014  . 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 H/O: stroke 08/27/2014  . GERD (gastroesophageal reflux disease) 02/07/2011  . BPH (benign prostatic hypertrophy) 07/12/2010  . Anxiety state 11/02/2009  . Essential hypertension 11/02/2009    Past Surgical History:  Procedure Laterality Date  . CARDIAC CATHETERIZATION  2006   Negative / 4 years ago  . EP IMPLANTABLE DEVICE N/A 01/13/2016   Procedure: Pacemaker Implant;  Surgeon: Thompson Grayer, MD;  Location: [facility]  CV LAB;  Service: Cardiovascular;  Laterality: N/A;       Home Medications    Prior to Admission medications   Medication Sig Start Date End Date Taking? Authorizing Provider  ALPRAZolam Duanne Moron) 0.5 MG tablet Take 1 tablet (0.5 mg total) by mouth 2 (two) times daily as needed for anxiety. 07/27/16   [name], MD  apixaban (ELIQUIS) 5 MG  TABS tablet Take 1 tablet (5 mg total) by mouth 2 (two) times daily. 04/27/16   Rosalin Hawking, MD  atorvastatin (LIPITOR) 10 MG tablet Take 0.5 tablets (5 mg total) by mouth daily at 6 PM. 03/06/16   Nishant Dhungel, MD  Camphor-Eucalyptus-Menthol (VICKS VAPORUB EX) Apply 1 application topically daily as needed (to help breathe).     Historical Provider, MD  carvedilol (COREG) 3.125 MG tablet Take 2 tablets (6.25 mg total) by mouth 2 (two) times daily with a meal. 06/09/16   Mihai Croitoru, MD  cephALEXin (KEFLEX) 500 MG capsule Take 1 capsule (500 mg total) by mouth 3 (three) times daily. 07/07/16   Dorothyann Peng, NP  dutasteride (AVODART) 0.5 MG capsule Take 1 capsule (0.5 mg total) by mouth daily. 04/27/16   [name], MD  fluticasone (FLONASE) 50 MCG/ACT nasal spray Place 2 sprays into both nostrils 2 (two) times daily.     Historical Provider, MD  furosemide (LASIX) 40 MG tablet Take 1 tablet (40 mg total) by mouth daily. 08/18/16 11/16/16  Mihai Croitoru, MD  gabapentin (NEURONTIN) 100 MG capsule TAKE 2 CAPSULES 3 TIMES DAILY 07/31/16   [name], MD  guaiFENesin (MUCINEX) 600 MG 12 hr tablet Take 1 tablet (600 mg total) by mouth 2 (two) times daily. 12/23/15   Renee A Kuneff, DO  KLOR-CON M10 10 MEQ tablet Take 10 mEq by mouth daily.  07/23/15   Historical Provider, MD  omeprazole (PRILOSEC) 20 MG capsule TAKE ONE CAPSULE BY MOUTH EVERY DAY Patient taking differently: TAKE 20 MG BY MOUTH EVERY DAY 04/17/16   [name], MD  polyethylene glycol (MIRALAX / GLYCOLAX) packet Take 17 g by mouth daily. 03/06/16   Nishant Dhungel, MD  pramipexole (MIRAPEX) 0.125 MG tablet TAKE 1 TABLET BY MOUTH AT BEDTIME Patient taking differently: TAKE 0.125 MG BY MOUTH AT BEDTIME 02/03/16   [name], MD  senna-docusate (SENOKOT-S) 8.6-50 MG tablet Take 1 tablet by mouth at bedtime as needed for mild constipation. 03/06/16   Nishant Dhungel, MD  sertraline (ZOLOFT) 100 MG tablet TAKE 1.5 TABLETS BY  MOUTH DAILY 07/18/16   [name], MD  tamsulosin (FLOMAX) 0.4 MG CAPS capsule Take 1 capsule (0.4 mg total) by mouth daily. 04/27/16   [name], MD  triamcinolone cream (KENALOG) 0.1 % Compound with Eucerin 1:1 and apply to affected rash bid prn. 07/14/16   [name], MD  Vitamin D, Ergocalciferol, (DRISDOL) 50000 UNITS CAPS capsule Take 1 capsule (50,000 Units total) by mouth every 7 (seven) days. 03/08/15   [name], MD    Family History Family History  Problem Relation Age of Onset  . Pneumonia Father 77    died  . Lung cancer Brother   . Heart disease Brother   . Pancreatic cancer Son   . Healthy Son   . Heart failure Son     Social History Social History  Substance Use Topics  . Smoking status: Former Smoker    Packs/day: 0.50    Years: 5.00  . Smokeless tobacco: Never Used  .  Alcohol use 0.6 oz/week    1 Shots of liquor per week     Comment: occ     Allergies   Chocolate   Review of Systems Review of Systems  Constitutional: Negative for fever.  Respiratory: Positive for cough.   Cardiovascular: Negative for chest pain.  All other systems reviewed and are negative.  Physical Exam Updated Vital Signs SpO2 97% Comment: 2L o2  Physical Exam  Constitutional: He appears well-developed and well-nourished. No distress.  HENT:  Head: Normocephalic.  Mouth/Throat: Oropharynx is clear and moist. No oropharyngeal exudate.  Bleeding in oral cavity  Eyes: Conjunctivae and EOM are normal. Pupils are equal, round, and reactive to light. Right eye exhibits no discharge. Left eye exhibits no discharge. No scleral icterus.  Neck: Normal range of motion. Neck supple. No JVD present. No tracheal deviation present.  Trachea is midline. No stridor or carotid bruits.  Cardiovascular: Normal rate, regular rhythm, normal heart sounds and intact distal pulses.   No murmur heard. Pulmonary/Chest: Effort normal. No stridor. No respiratory distress. He  has decreased breath sounds. He has no wheezes. He has no rales.  Abdominal: Soft. Bowel sounds are normal. He exhibits no distension. There is no tenderness. There is no rebound and no guarding.  Musculoskeletal: Normal range of motion. He exhibits no edema or tenderness.  Lymphadenopathy:    He has no cervical adenopathy.  Neurological: He is alert. He has normal reflexes.  Skin: Skin is warm and dry. Capillary refill takes less than 2 seconds.  Psychiatric: He has a normal mood and affect. His behavior is normal.  Nursing note and vitals reviewed.  ED Treatments / Results  Labs (all labs ordered are listed, but only abnormal results are displayed) Labs Reviewed - No data to display  EKG  EKG Interpretation None       Radiology Dg Chest 2 View  Result Date: 08/23/2016 CLINICAL DATA:  Coughing up blood for 6 months. Worsening over 24 hours. Short of breath. EXAM: CHEST  2 VIEW COMPARISON:  05/05/2016 FINDINGS: Cardiac pacemaker. Shallow inspiration with atelectasis in the lung bases. Borderline heart size with increased pulmonary vascularity. Mild perihilar infiltrative pattern likely represents edema. Infiltration in the left lung base behind the heart. Can't exclude superimposed pneumonia although this may be asymmetric edema. No blunting of costophrenic angles. No pneumothorax. Calcification of the aorta. IMPRESSION: Cardiac enlargement with pulmonary vascular congestion and perihilar edema. Atelectasis in the lung bases. Possible superimposed consolidation in the left lung base. Electronically Signed   By: Lucienne Capers M.D.   On: 08/23/2016 03:20    Procedures Procedures  DIAGNOSTIC STUDIES:  Oxygen Saturation is 97% on RA, normal by my interpretation.    COORDINATION OF CARE:  2:34 AM Discussed treatment Nicholson with pt at bedside and pt agreed to Nicholson.  3:47 AM No further blood in pt's mouth after being cleaned out.  Medications Ordered in ED Medications - No data to  display  Initial Impression / Assessment and Nicholson / ED Course  I have reviewed the triage vital signs and the nursing notes.  Pertinent labs & imaging results that were available during my care of the patient were reviewed by me and considered in my medical decision making (see chart for details).  Clinical Course    Vitals:   08/23/16 0245  BP: 119/68  Pulse: (!) 58  Resp: 23  Temp: 99.4 F (37.4 C)    Results for orders placed or performed during the hospital encounter  of 08/23/16  CBC with Differential/Platelet  Result Value Ref Range   WBC 6.8 4.0 - 10.5 K/uL   RBC 3.53 (L) 4.22 - 5.81 MIL/uL   Hemoglobin 10.3 (L) 13.0 - 17.0 g/dL   HCT 29.9 (L) 39.0 - 52.0 %   MCV 84.7 78.0 - 100.0 fL   MCH 29.2 26.0 - 34.0 pg   MCHC 34.4 30.0 - 36.0 g/dL   RDW 15.2 11.5 - 15.5 %   Platelets 133 (L) 150 - 400 K/uL   Neutrophils Relative % 88 %   Neutro Abs 6.0 1.7 - 7.7 K/uL   Lymphocytes Relative 6 %   Lymphs Abs 0.4 (L) 0.7 - 4.0 K/uL   Monocytes Relative 6 %   Monocytes Absolute 0.4 0.1 - 1.0 K/uL   Eosinophils Relative 0 %   Eosinophils Absolute 0.0 0.0 - 0.7 K/uL   Basophils Relative 0 %   Basophils Absolute 0.0 0.0 - 0.1 K/uL  Protime-INR  Result Value Ref Range   Prothrombin Time 18.9 (H) 11.4 - 15.2 seconds   INR 1.57   Comprehensive metabolic panel  Result Value Ref Range   Sodium 141 135 - 145 mmol/L   Potassium 3.8 3.5 - 5.1 mmol/L   Chloride 110 101 - 111 mmol/L   CO2 22 22 - 32 mmol/L   Glucose, Bld 150 (H) 65 - 99 mg/dL   BUN 48 (H) 6 - 20 mg/dL   Creatinine, Ser 1.67 (H) 0.61 - 1.24 mg/dL   Calcium 9.2 8.9 - 10.3 mg/dL   Total Protein 6.9 6.5 - 8.1 g/dL   Albumin 4.0 3.5 - 5.0 g/dL   AST 37 15 - 41 U/L   ALT 37 17 - 63 U/L   Alkaline Phosphatase 59 38 - 126 U/L   Total Bilirubin 2.7 (H) 0.3 - 1.2 mg/dL   GFR calc non Af Amer 33 (L) >60 mL/min   GFR calc Af Amer 39 (L) >60 mL/min   Anion gap 9 5 - 15  I-stat chem 8, ed  Result Value Ref Range    Sodium 142 135 - 145 mmol/L   Potassium 3.8 3.5 - 5.1 mmol/L   Chloride 107 101 - 111 mmol/L   BUN 40 (H) 6 - 20 mg/dL   Creatinine, Ser 1.60 (H) 0.61 - 1.24 mg/dL   Glucose, Bld 145 (H) 65 - 99 mg/dL   Calcium, Ion 1.12 (L) 1.15 - 1.40 mmol/L   TCO2 21 0 - 100 mmol/L   Hemoglobin 10.5 (L) 13.0 - 17.0 g/dL   HCT 31.0 (L) 39.0 - 52.0 %   Dg Chest 2 View  Result Date: 08/23/2016 CLINICAL DATA:  Coughing up blood for 6 months. Worsening over 24 hours. Short of breath. EXAM: CHEST  2 VIEW COMPARISON:  05/05/2016 FINDINGS: Cardiac pacemaker. Shallow inspiration with atelectasis in the lung bases. Borderline heart size with increased pulmonary vascularity. Mild perihilar infiltrative pattern likely represents edema. Infiltration in the left lung base behind the heart. Can't exclude superimposed pneumonia although this may be asymmetric edema. No blunting of costophrenic angles. No pneumothorax. Calcification of the aorta. IMPRESSION: Cardiac enlargement with pulmonary vascular congestion and perihilar edema. Atelectasis in the lung bases. Possible superimposed consolidation in the left lung base. Electronically Signed   By: Lucienne Capers M.D.   On: 08/23/2016 03:20   Medications  chlorhexidine (PERIDEX) 0.12 % solution 15 mL (15 mLs Mouth/Throat Given 08/23/16 0323)  cefTRIAXone (ROCEPHIN) 1 g in dextrose 5 % 50 mL IVPB (  not administered)  azithromycin (ZITHROMAX) 500 mg in dextrose 5 % 250 mL IVPB (not administered)  furosemide (LASIX) injection 40 mg (not administered)   ED ECG REPORT   Date: 08/23/2016  Rate: 79  Rhythm: ventricular paced  QRS Axis: left  Intervals: QT prolonged  ST/T Wave abnormalities: nonspecific ST changes  Conduction Disutrbances:none  Narrative Interpretation:   Old EKG Reviewed: none available  I have personally reviewed the EKG tracing and agree with the computerized printout as noted.  I personally performed the services described in this documentation,  which was scribed in my presence. The recorded information has been reviewed and is accurate.   Final Clinical Impressions(s) / ED Diagnoses   Final diagnoses:  None   Will admit to obs.   New Prescriptions New Prescriptions   No medications on file     Krystin Keeven, MD 08/23/16 [phone]

## 2016-08-23 NOTE — Progress Notes (Signed)
Patient seen and evaluated earlier this am by my associate. Please refer to H and P for details regarding assessment and plan.   Gen: Pt in nad, alert and awake CV: no cyanosis Pulm: equal chest rise, no wheezes  Will reassess next am.  Velvet Bathe   For now continue current antibiotic regimen

## 2016-08-23 NOTE — H&P (Signed)
History and Physical  Patient Name: Micheal Nicholson     [id number]    DOB: 1920/09/12    DOA: 08/23/2016 PCP: [name], MD   Patient coming from: Home  Chief Complaint: Hemoptysis  HPI: Micheal Nicholson is a 82 y.o. 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  History is collected from the EDP and chart, as the patient is an unreliable historian, and appears either disoriented or demented.  Per report, the patient presented alone complaining of six months of intermittent hemoptysis, like coughing up "small globs" of red blood.  To me he endorses chronic hemoptysis and also left sided rib pain. He is really unable to provide any further history.  ED course: -Temp oral 99.11F, heart rate 50s, respirations 20s, requiring small amounts supplemental oxygen -Na 141, K 3.8, Cr 1.67 (baseline 1.2-1.3), WBC 6.8 K, Hgb 10.3 (baseline 11-12) -INR 1.6, platelets 133, BNP 1200 pg/mL -CXR showed a questionable left base opacity -She was given ceftriaxone and azithromycin and TRH were asked to evaluate for admission for CABG and CHF     ROS: Review of Systems  Unable to perform ROS: Dementia          Past Medical History:  Diagnosis Date  . Anxiety   . Arthritis   . BPH (benign prostatic hyperplasia)   . CHF (congestive heart failure) ([place])   . Chronic combined systolic and diastolic CHF, NYHA class 2 ([place]) 04/17/2015  . GERD (gastroesophageal reflux disease)   . Hypertension   . Stroke ([place])   . Symptomatic sinus bradycardia    PPM Dr. Rayann Heman, 01/13/16, STJ device    Past Surgical History:  Procedure Laterality Date  . CARDIAC CATHETERIZATION  2006   Negative / 4 years ago  . EP IMPLANTABLE DEVICE N/A 01/13/2016   Procedure: Pacemaker Implant;  Surgeon: Thompson Grayer, MD;   Location: [facility];  Service: Cardiovascular;  Laterality: N/A;    Social History: Patient lives with his son "who is sicker than I am" in [place].  The patient walks with a cane.  Minimal smoking history.  States that "I can't remember anything since my strokes".  Allergies  Allergen Reactions  . Chocolate Other (See Comments)    Reaction:  Acid reflux     Family history: family history includes Healthy in his son; Heart disease in his brother; Heart failure in his son; Lung cancer in his brother; Pancreatic cancer in his son; Pneumonia (age of onset: 81) in his father.  Prior to Admission medications   Medication Sig Start Date End Date Taking? Authorizing Provider  ALPRAZolam Duanne Moron) 0.5 MG tablet Take 1 tablet (0.5 mg total) by mouth 2 (two) times daily as needed for anxiety. 07/27/16  Yes [name], MD  apixaban (ELIQUIS) 5 MG TABS tablet Take 1 tablet (5 mg total) by mouth 2 (two) times daily. 04/27/16  Yes Rosalin Hawking, MD  atorvastatin (LIPITOR) 10 MG tablet Take 0.5 tablets (5 mg total) by mouth daily at 6 PM. 03/06/16  Yes Nishant Dhungel, MD  carvedilol (COREG) 3.125 MG tablet Take 2 tablets (6.25 mg total) by mouth 2 (two) times daily with a meal. 06/09/16  Yes Mihai Croitoru, MD  dutasteride (AVODART) 0.5 MG capsule Take 1 capsule (0.5 mg total) by mouth daily. 04/27/16  Yes Bruce Dan Europe,  MD  fluticasone (FLONASE) 50 MCG/ACT nasal spray Place 2 sprays into both nostrils 2 (two) times daily as needed for rhinitis.    Yes Historical Provider, MD  furosemide (LASIX) 20 MG tablet Take 20 mg by mouth every Monday, Wednesday, and Friday.   Yes Historical Provider, MD  furosemide (LASIX) 40 MG tablet Take 1 tablet (40 mg total) by mouth daily. 08/18/16 11/16/16 Yes Mihai Croitoru, MD  gabapentin (NEURONTIN) 100 MG capsule Take 200 mg by mouth 3 (three) times daily.   Yes Historical Provider, MD  lisinopril (PRINIVIL,ZESTRIL) 10 MG tablet Take 10 mg by mouth daily.   Yes  Historical Provider, MD  omeprazole (PRILOSEC) 20 MG capsule Take 20 mg by mouth daily.   Yes Historical Provider, MD  polyethylene glycol (MIRALAX / GLYCOLAX) packet Take 17 g by mouth daily as needed for mild constipation.   Yes Historical Provider, MD  potassium chloride (K-DUR,KLOR-CON) 10 MEQ tablet Take 10 mEq by mouth daily.   Yes Historical Provider, MD  pramipexole (MIRAPEX) 0.125 MG tablet Take 0.125 mg by mouth at bedtime.   Yes Historical Provider, MD  senna-docusate (SENOKOT-S) 8.6-50 MG tablet Take 1 tablet by mouth at bedtime as needed for mild constipation. 03/06/16  Yes Nishant Dhungel, MD  sertraline (ZOLOFT) 100 MG tablet Take 150 mg by mouth daily.   Yes Historical Provider, MD  tamsulosin (FLOMAX) 0.4 MG CAPS capsule Take 1 capsule (0.4 mg total) by mouth daily. 04/27/16  Yes [name], MD  Triamcinolone Acetonide (TRIAMCINOLONE 0.1 % CREAM : EUCERIN) CREA Apply 1 application topically 2 (two) times daily as needed for rash, itching or irritation.   Yes Historical Provider, MD  cephALEXin (KEFLEX) 500 MG capsule Take 1 capsule (500 mg total) by mouth 3 (three) times daily. Patient not taking: Reported on 08/23/2016 07/07/16   Dorothyann Peng, NP       Physical Exam: BP 119/68 (BP Location: Left Arm)   Pulse (!) 58   Temp 99.4 F (37.4 C) (Oral)   Resp 23   Ht 5\' 10"  (1.778 m)   Wt 72.6 kg (160 lb)   SpO2 95%   BMI 22.96 kg/m  General appearance: Frail appearing elderly adult male, awake but confused appearing, in no obvious distress.   Eyes: Anicteric, conjunctiva pink, lids and lashes normal.     ENT: No nasal deformity, discharge, or epistaxis.  OP dry, with dried caked blood on posterior OP.  No gum bleeding.   Lymph: No cervical or supraclavicular lymphadenopathy. Skin: Hot and moist.  No jaundice.  No suspicious rashes or lesions other than well-healing inner left thigh burn. Cardiac: RRR, nl S1-S2, no murmurs appreciated.  Capillary refill is brisk.  JVP  does not appear elevated.  Marked bilateral pitting LE edema.  Radial and DP pulses 2+ and symmetric. Respiratory: Increased respiratory rate iwthout labored breathing.  Rales in left base.  No wheezes. GI: Abdomen soft without rigidity.  No TTP. No ascites, distension, hepatosplenomegaly.   MSK: No deformities or effusions.  No clubbing/cyanosis. Neuro: Cranial nerves normal.  Right leg 4/5, all other MSK strength 5/5.  Awake and answering questions, but memory poor, HOH, and responses slowed.  Unable to remember the name of where he is, what month it is, what year.    Psych: Affect pleasant.  Judgment and insight appear impaired.       Labs on Admission:  I have personally reviewed following labs and imaging studies: CBC:  Recent Labs Lab 08/23/16 0244  08/23/16 0258  WBC 6.8  --   NEUTROABS 6.0  --   HGB 10.3* 10.5*  HCT 29.9* 31.0*  MCV 84.7  --   PLT 133*  --    Basic Metabolic Panel:  Recent Labs Lab 08/23/16 0244 08/23/16 0258  NA 141 142  K 3.8 3.8  CL 110 107  CO2 22  --   GLUCOSE 150* 145*  BUN 48* 40*  CREATININE 1.67* 1.60*  CALCIUM 9.2  --    GFR: Estimated Creatinine Clearance: 29 mL/min (by C-G formula based on SCr of 1.6 mg/dL).  Liver Function Tests:  Recent Labs Lab 08/23/16 0244  AST 37  ALT 37  ALKPHOS 59  BILITOT 2.7*  PROT 6.9  ALBUMIN 4.0   Coagulation Profile:  Recent Labs Lab 08/23/16 0244  INR 1.57        Radiological Exams on Admission: Personally reviewed: Dg Chest 2 View  Result Date: 08/23/2016 CLINICAL DATA:  Coughing up blood for 6 months. Worsening over 24 hours. Short of breath. EXAM: CHEST  2 VIEW COMPARISON:  05/05/2016 FINDINGS: Cardiac pacemaker. Shallow inspiration with atelectasis in the lung bases. Borderline heart size with increased pulmonary vascularity. Mild perihilar infiltrative pattern likely represents edema. Infiltration in the left lung base behind the heart. Can't exclude superimposed pneumonia  although this may be asymmetric edema. No blunting of costophrenic angles. No pneumothorax. Calcification of the aorta. IMPRESSION: Cardiac enlargement with pulmonary vascular congestion and perihilar edema. Atelectasis in the lung bases. Possible superimposed consolidation in the left lung base. Electronically Signed   By: Lucienne Capers M.D.   On: 08/23/2016 03:20    Echocardiogram Jan 2017: EF 25% Severe pHTN Mod to severe TR RHF    Assessment/Plan 1. Hemoptysis/CAP:  -Ceftriaxone and azithromycin -Urine strep and legionella antigens -Check procalcitonin per protocol -Sputum culture ordered -Supplemental O2 as needed -Albuterol PRN for dyspnea or wheezing -Cough syrup   2. Acute on chronic systolic and diastolic CHF and right heart failure:  -Furosmide 40 mg IV daily -Daily BMP, close monitoring of renal function -Strict I/Os, daily weights -Continue K supplement  3. Afib:  CHADS2Vasc 7. -Continue Eliquis -Continue carvedilol  4. AKI:  Suspect this is from renal congestion given RHF. -Diuresis as above -Close monitoring of renal function, and cease furosemide if worsening -Hold ACEi  5. Anemia:  Slightly lower than previous. -Trend CBC  6. Thrombocytopenia:  Trending slowly down.  7. Lymphadenopathy:  Had CT scan of chest in March that showed nonspecific lymphadenopathy.  Is due for repeat CT chest in 1-6 months now.  Defer for now due to renal function.  8. HTN -Continue carvedilol -Hold ACEi  9. Other medications: -Continue PPI -Continue sertraline -Continue mirapex -Continue dutasteride -Continue alprazolam -Continue statin      DVT prophylaxis: On full dose anticoagulation  Code Status: FULL  Family Communication: None present  Disposition Plan: Anticipate diuresis and treatment of presumed CAP.   Consults called: None Admission status: INPATIENT med surg       Medical decision making: Patient seen at 4:16 AM on 08/23/2016.  The  patient was discussed with Dr. Randal Buba. What exists of the patient's chart was reviewed in depth.  Clinical condition: stable.        Edwin Dada [facility] Pager [phone]

## 2016-08-23 NOTE — Progress Notes (Signed)
  Echocardiogram 2D Echocardiogram has been performed.  Micheal Nicholson 08/23/2016, 12:12 PM

## 2016-08-23 NOTE — Telephone Encounter (Signed)
FYI Pt in [place] [facility]
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
## 2016-08-24 DIAGNOSIS — E876 Hypokalemia: Secondary | ICD-10-CM

## 2016-08-24 LAB — BASIC METABOLIC PANEL
ANION GAP: 8 (ref 5–15)
BUN: 45 mg/dL — ABNORMAL HIGH (ref 6–20)
CALCIUM: 8.5 mg/dL — AB (ref 8.9–10.3)
CO2: 26 mmol/L (ref 22–32)
Chloride: 106 mmol/L (ref 101–111)
Creatinine, Ser: 1.49 mg/dL — ABNORMAL HIGH (ref 0.61–1.24)
GFR, EST AFRICAN AMERICAN: 44 mL/min — AB (ref >60)
GFR, EST NON AFRICAN AMERICAN: 38 mL/min — AB (ref >60)
GLUCOSE: 94 mg/dL (ref 65–99)
POTASSIUM: 3.1 mmol/L — AB (ref 3.5–5.1)
SODIUM: 140 mmol/L (ref 135–145)

## 2016-08-24 LAB — LEGIONELLA PNEUMOPHILA SEROGP 1 UR AG: L. PNEUMOPHILA SEROGP 1 UR AG: NEGATIVE

## 2016-08-24 MED ORDER — POTASSIUM CHLORIDE CRYS ER 20 MEQ PO TBCR
40.0000 meq | EXTENDED_RELEASE_TABLET | Freq: Once | ORAL | Status: AC
  Administered 2016-08-24: 40 meq via ORAL
  Filled 2016-08-24: qty 2

## 2016-08-24 NOTE — Progress Notes (Signed)
Trial off oxygen, patient's oxygen saturation decreased to 87%. 2L [place] resumed, oxygen increased to 95%. Will maintain 2L [place] at this time and continue to monitor.

## 2016-08-24 NOTE — Consult Note (Signed)
   [facility] CM Inpatient Consult   08/24/2016  Micheal Nicholson 1920-04-04 [id number]    Patient screened for [place] Management services. Went to bedside to offer and explain [facility] Care Management program with patient. Patient declined [place] Management follow up. [name] states his daughter-in- law is a Marine scientist and she fills his pill box and the like. States he has good support with family and follows closely with his Physician.  Accepted [facility] Care Management brochure with contact information to call in future if [place] Management services are needed. Appreciative of visit. Will make inpatient RNCM aware that [name] pleasantly declined [place] Management program services.  Marthenia Rolling, MSN-Ed, RN,BSN [facility] Liaison [phone]

## 2016-08-24 NOTE — Progress Notes (Signed)
PROGRESS NOTE    Micheal Nicholson  [id number] DOB: [birth year] DOA: 08/23/2016 PCP: [name], MD    Brief Narrative:     Assessment & Plan:   Principal Problem:   Community acquired pneumonia - Continue current antibiotic regimen. Hemoptysis resolving on this regimen. - continue supporting therapy.  Hypokalemia - Replace orally - Reassess next am.  Active Problems:   Essential hypertension -  Pt on carvedilol, stable    Chronic combined systolic and diastolic CHF, NYHA class 2 (HCC) - stable continue current regimen    AKI (acute kidney injury) ([place]) - serum creatinine trending down. - stable    Persistent atrial fibrillation (HCC) - continue B blocker - anticoagulated with Eliquis    Hemiplegia (HCC) - stable no new focal neurological deficits     Normocytic anemia   Thrombocytopenia (HCC)   Cardiac pacemaker in situ    DVT prophylaxis: Eliquis Code Status:Full Family Communication: d/c son Disposition Plan: with resolution of hemoptysis and improvement in respiratory condition3   Consultants:   none   Procedures: (None   Antimicrobials: Azithromycin, Rocephin   Subjective: Pt has no new complaints, states he feels a little better  Objective: Vitals:   08/24/16 0300 08/24/16 0301 08/24/16 0455 08/24/16 0600  BP:   97/63   Pulse:   66   Resp:   (!) 22   Temp:   97.5 F (36.4 C)   TempSrc:   Oral   SpO2: (!) 87% 95% 93%   Weight:    75.2 kg (165 lb 12.6 oz)  Height:    5\' 10"  (1.778 m)    Intake/Output Summary (Last 24 hours) at 08/24/16 1132 Last data filed at 08/24/16 0931  Gross per 24 hour  Intake              600 ml  Output              475 ml  Net              125 ml   Filed Weights   08/23/16 0244 08/23/16 0531 08/24/16 0600  Weight: 72.6 kg (160 lb) 73.7 kg (162 lb 7.7 oz) 75.2 kg (165 lb 12.6 oz)    Examination:  General exam: Appears calm and comfortable, in nad.  Respiratory system: equal chest  rise, no wheezes, rhales mild Cardiovascular system: S1 & S2 heard, IRRR. No JVD, murmurs, rubs,  Gastrointestinal system: Abdomen is nondistended, soft and nontender. No organomegaly or masses felt. Normal bowel sounds heard. Central nervous system: Alert and oriented. Answers questions appropriately Extremities: no rashes Skin: No rashes, lesions or ulcers Psychiatry: Judgement and insight appear normal. Mood & affect appropriate.   Data Reviewed: I have personally reviewed following labs and imaging studies  CBC:  Recent Labs Lab 08/23/16 0244 08/23/16 0258 08/23/16 0553  WBC 6.8  --  8.3  NEUTROABS 6.0  --   --   HGB 10.3* 10.5* 10.1*  HCT 29.9* 31.0* 30.0*  MCV 84.7  --  85.0  PLT 133*  --  [id number]*   Basic Metabolic Panel:  Recent Labs Lab 08/23/16 0244 08/23/16 0258 08/24/16 0550  NA 141 142 140  K 3.8 3.8 3.1*  CL 110 107 106  CO2 22  --  26  GLUCOSE 150* 145* 94  BUN 48* 40* 45*  CREATININE 1.67* 1.60* 1.49*  CALCIUM 9.2  --  8.5*   GFR: Estimated Creatinine Clearance: 31.3 mL/min (by C-G formula based on SCr of  1.49 mg/dL). Liver Function Tests:  Recent Labs Lab 08/23/16 0244  AST 37  ALT 37  ALKPHOS 59  BILITOT 2.7*  PROT 6.9  ALBUMIN 4.0   No results for input(s): LIPASE, AMYLASE in the last 168 hours. No results for input(s): AMMONIA in the last 168 hours. Coagulation Profile:  Recent Labs Lab 08/23/16 0244  INR 1.57   Cardiac Enzymes: No results for input(s): CKTOTAL, CKMB, CKMBINDEX, TROPONINI in the last 168 hours. BNP (last 3 results) No results for input(s): PROBNP in the last 8760 hours. HbA1C: No results for input(s): HGBA1C in the last 72 hours. CBG: No results for input(s): GLUCAP in the last 168 hours. Lipid Profile: No results for input(s): CHOL, HDL, LDLCALC, TRIG, CHOLHDL, LDLDIRECT in the last 72 hours. Thyroid Function Tests: No results for input(s): TSH, T4TOTAL, FREET4, T3FREE, THYROIDAB in the last 72 hours. Anemia  Panel: No results for input(s): VITAMINB12, FOLATE, FERRITIN, TIBC, IRON, RETICCTPCT in the last 72 hours. Sepsis Labs:  Recent Labs Lab 08/23/16 0553  PROCALCITON 0.40    No results found for this or any previous visit (from the past 240 hour(s)).       Radiology Studies: Dg Chest 2 View  Result Date: 08/23/2016 CLINICAL DATA:  Coughing up blood for 6 months. Worsening over 24 hours. Short of breath. EXAM: CHEST  2 VIEW COMPARISON:  05/05/2016 FINDINGS: Cardiac pacemaker. Shallow inspiration with atelectasis in the lung bases. Borderline heart size with increased pulmonary vascularity. Mild perihilar infiltrative pattern likely represents edema. Infiltration in the left lung base behind the heart. Can't exclude superimposed pneumonia although this may be asymmetric edema. No blunting of costophrenic angles. No pneumothorax. Calcification of the aorta. IMPRESSION: Cardiac enlargement with pulmonary vascular congestion and perihilar edema. Atelectasis in the lung bases. Possible superimposed consolidation in the left lung base. Electronically Signed   By: Lucienne Capers M.D.   On: 08/23/2016 03:20        Scheduled Meds: . apixaban  2.5 mg Oral BID  . atorvastatin  5 mg Oral q1800  . azithromycin  250 mg Oral Q24H  . carvedilol  6.25 mg Oral BID WC  . cefTRIAXone (ROCEPHIN)  IV  1 g Intravenous Q24H  . chlorhexidine  15 mL Mouth/Throat BID  . dutasteride  0.5 mg Oral Daily  . furosemide  40 mg Intravenous Daily  . gabapentin  200 mg Oral TID  . guaiFENesin  600 mg Oral BID  . hydrocerin   Topical BID  . pantoprazole  40 mg Oral Daily  . potassium chloride  10 mEq Oral Daily  . pramipexole  0.125 mg Oral QHS  . sertraline  150 mg Oral Daily  . tamsulosin  0.4 mg Oral Daily   Continuous Infusions:    LOS: 1 day    Time spent: > 35 minutes    Velvet Bathe, MD [facility] Pager [phone]  If 7PM-7AM, please contact  night-coverage [email or web address] Password [facility] 08/24/2016, 11:32 AM

## 2016-08-25 LAB — BASIC METABOLIC PANEL
ANION GAP: 8 (ref 5–15)
BUN: 40 mg/dL — ABNORMAL HIGH (ref 6–20)
CALCIUM: 8.5 mg/dL — AB (ref 8.9–10.3)
CO2: 25 mmol/L (ref 22–32)
CREATININE: 1.35 mg/dL — AB (ref 0.61–1.24)
Chloride: 104 mmol/L (ref 101–111)
GFR, EST AFRICAN AMERICAN: 50 mL/min — AB (ref >60)
GFR, EST NON AFRICAN AMERICAN: 43 mL/min — AB (ref >60)
Glucose, Bld: 104 mg/dL — ABNORMAL HIGH (ref 65–99)
Potassium: 3.6 mmol/L (ref 3.5–5.1)
Sodium: 137 mmol/L (ref 135–145)

## 2016-08-25 LAB — PROCALCITONIN: PROCALCITONIN: 0.22 ng/mL

## 2016-08-25 NOTE — Progress Notes (Signed)
PROGRESS NOTE    Micheal Nicholson  [id number] DOB: [birth year] DOA: 08/23/2016 PCP: [name], MD    Brief Narrative:  82 y.o. 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  Assessment & Plan:   Principal Problem:   Community acquired pneumonia - Continue current antibiotic regimen. Hemoptysis resolving on this regimen. - continue supporting therapy. - Will work on weaning off supplemental oxygen.   Hypokalemia - Replace orally - Reassess next am.  Active Problems:   Essential hypertension -  Pt on carvedilol, stable    Chronic combined systolic and diastolic CHF, NYHA class 2 (HCC) - stable continue current regimen    AKI (acute kidney injury) ([place]) - serum creatinine trending down. - stable    Persistent atrial fibrillation (HCC) - continue B blocker - anticoagulated with Eliquis    Hemiplegia (HCC) - stable no new focal neurological deficits     Normocytic anemia   Thrombocytopenia (HCC)   Cardiac pacemaker in situ    DVT prophylaxis: Eliquis Code Status:Full Family Communication: d/c son Disposition Plan: with resolution of hemoptysis and improvement in respiratory condition   Consultants:   none   Procedures: (None   Antimicrobials: Azithromycin, Rocephin   Subjective: States he feels about the same but the coughing up blood is resolving.   Objective: Vitals:   08/24/16 0600 08/24/16 1400 08/24/16 2123 08/25/16 0541  BP:  105/65 117/77 110/62  Pulse:  62 63 (!) 58  Resp:  (!) 22 (!) 24 20  Temp:  97.5 F (36.4 C) 97.8 F (36.6 C) 97.7 F (36.5 C)  TempSrc:  Axillary Oral Oral  SpO2:  93% 94% (!) 86%  Weight: 75.2 kg (165 lb 12.6 oz)   74.7 kg (164 lb 10.9 oz)  Height: 5\' 10"  (1.778 m)       Intake/Output Summary (Last 24 hours)  at 08/25/16 1206 Last data filed at 08/25/16 1118  Gross per 24 hour  Intake              360 ml  Output             1450 ml  Net            -1090 ml   Filed Weights   08/23/16 0531 08/24/16 0600 08/25/16 0541  Weight: 73.7 kg (162 lb 7.7 oz) 75.2 kg (165 lb 12.6 oz) 74.7 kg (164 lb 10.9 oz)    Examination:  General exam: Appears calm and comfortable, in nad.  Respiratory system: equal chest rise, no wheezes, rhales mild Cardiovascular system: S1 & S2 heard, IRRR. No JVD, murmurs, rubs,  Gastrointestinal system: Abdomen is nondistended, soft and nontender. No organomegaly or masses felt. Normal bowel sounds heard. Central nervous system: Alert and oriented. Answers questions appropriately Extremities: no rashes Skin: No rashes, lesions or ulcers Psychiatry: Judgement and insight appear normal. Mood & affect appropriate.   Data Reviewed: I have personally reviewed following labs and imaging studies  CBC:  Recent Labs Lab 08/23/16 0244 08/23/16 0258 08/23/16 0553  WBC 6.8  --  8.3  NEUTROABS 6.0  --   --   HGB 10.3* 10.5* 10.1*  HCT 29.9* 31.0* 30.0*  MCV 84.7  --  85.0  PLT 133*  --  [id number]*   Basic Metabolic Panel:  Recent Labs Lab 08/23/16 0244 08/23/16 0258 08/24/16  0550 08/25/16 0517  NA 141 142 140 137  K 3.8 3.8 3.1* 3.6  CL 110 107 106 104  CO2 22  --  26 25  GLUCOSE 150* 145* 94 104*  BUN 48* 40* 45* 40*  CREATININE 1.67* 1.60* 1.49* 1.35*  CALCIUM 9.2  --  8.5* 8.5*   GFR: Estimated Creatinine Clearance: 34.5 mL/min (by C-G formula based on SCr of 1.35 mg/dL). Liver Function Tests:  Recent Labs Lab 08/23/16 0244  AST 37  ALT 37  ALKPHOS 59  BILITOT 2.7*  PROT 6.9  ALBUMIN 4.0   No results for input(s): LIPASE, AMYLASE in the last 168 hours. No results for input(s): AMMONIA in the last 168 hours. Coagulation Profile:  Recent Labs Lab 08/23/16 0244  INR 1.57   Cardiac Enzymes: No results for input(s): CKTOTAL, CKMB, CKMBINDEX,  TROPONINI in the last 168 hours. BNP (last 3 results) No results for input(s): PROBNP in the last 8760 hours. HbA1C: No results for input(s): HGBA1C in the last 72 hours. CBG: No results for input(s): GLUCAP in the last 168 hours. Lipid Profile: No results for input(s): CHOL, HDL, LDLCALC, TRIG, CHOLHDL, LDLDIRECT in the last 72 hours. Thyroid Function Tests: No results for input(s): TSH, T4TOTAL, FREET4, T3FREE, THYROIDAB in the last 72 hours. Anemia Panel: No results for input(s): VITAMINB12, FOLATE, FERRITIN, TIBC, IRON, RETICCTPCT in the last 72 hours. Sepsis Labs:  Recent Labs Lab 08/23/16 0553 08/25/16 0517  PROCALCITON 0.40 0.22    No results found for this or any previous visit (from the past 240 hour(s)).       Radiology Studies: No results found.      Scheduled Meds: . apixaban  2.5 mg Oral BID  . atorvastatin  5 mg Oral q1800  . azithromycin  250 mg Oral Q24H  . carvedilol  6.25 mg Oral BID WC  . cefTRIAXone (ROCEPHIN)  IV  1 g Intravenous Q24H  . chlorhexidine  15 mL Mouth/Throat BID  . dutasteride  0.5 mg Oral Daily  . furosemide  40 mg Intravenous Daily  . gabapentin  200 mg Oral TID  . guaiFENesin  600 mg Oral BID  . hydrocerin   Topical BID  . pantoprazole  40 mg Oral Daily  . potassium chloride  10 mEq Oral Daily  . pramipexole  0.125 mg Oral QHS  . sertraline  150 mg Oral Daily  . tamsulosin  0.4 mg Oral Daily   Continuous Infusions:    LOS: 2 days   Time spent: > 35 minutes  Velvet Bathe, MD [facility] Pager [phone]  If 7PM-7AM, please contact night-coverage [email or web address] Password [email or web address] 08/25/2016, 12:06 PM

## 2016-08-26 LAB — BASIC METABOLIC PANEL
ANION GAP: 10 (ref 5–15)
BUN: 35 mg/dL — ABNORMAL HIGH (ref 6–20)
CHLORIDE: 104 mmol/L (ref 101–111)
CO2: 24 mmol/L (ref 22–32)
Calcium: 8.6 mg/dL — ABNORMAL LOW (ref 8.9–10.3)
Creatinine, Ser: 1.34 mg/dL — ABNORMAL HIGH (ref 0.61–1.24)
GFR calc non Af Amer: 44 mL/min — ABNORMAL LOW (ref >60)
GFR, EST AFRICAN AMERICAN: 51 mL/min — AB (ref >60)
GLUCOSE: 99 mg/dL (ref 65–99)
Potassium: 4 mmol/L (ref 3.5–5.1)
Sodium: 138 mmol/L (ref 135–145)

## 2016-08-26 NOTE — Evaluation (Signed)
Clinical/Bedside Swallow Evaluation Patient Details  Name: Micheal Nicholson MRN: [id number] Date of Birth: [birth year]  Today's Date: 08/26/2016 Time: SLP Start Time (ACUTE ONLY): 1638 SLP Stop Time (ACUTE ONLY): 1653 SLP Time Calculation (min) (ACUTE ONLY): 15 min  Past Medical History:  Past Medical History:  Diagnosis Date  . Anxiety   . Arthritis   . BPH (benign prostatic hyperplasia)   . CHF (congestive heart failure) ([place])   . Chronic combined systolic and diastolic CHF, NYHA class 2 ([place]) 04/17/2015  . GERD (gastroesophageal reflux disease)   . Hypertension   . Stroke ([place])   . Symptomatic sinus bradycardia    PPM Dr. Rayann Heman, 01/13/16, STJ device   Past Surgical History:  Past Surgical History:  Procedure Laterality Date  . CARDIAC CATHETERIZATION  2006   Negative / 4 years ago  . EP IMPLANTABLE DEVICE N/A 01/13/2016   Procedure: Pacemaker Implant;  Surgeon: Thompson Grayer, MD;  Location: [facility];  Service: Cardiovascular;  Laterality: N/A;   HPI:  82 y.o.malewith a past medical history significant for Afib, bradycardia with pacer, GERD, chronic systolic and diastolic CHF EF 123XX123, HTN, hx CVA with residual right sided weakness and cognitive deficitswho presents with hemoptysis.CXR mild perihilar infiltrative pattern likely represents edema. Pt coughing with eggs this am and water later this afternoon per RN.   Assessment / Plan / Recommendation Clinical Impression  Pt exhibited a suspected delayed swallow initiation and intermittent airway compromise indicated by wet vocal quality (inconsistent). No cough or throat clear present. Given history of CVA, GERD, clinical observations and age pt may have episodic penetration/aspiration. Thicker liquids are more more toxic to the lungs versus thin liquids and recommend pt continue small sips thin with behavioral management and diligence re: upright position, no straws, small sips and remain upright following meals (min  30 min). Continue regular texture diet. No further ST at this time.      Aspiration Risk  Severe aspiration risk    Diet Recommendation Regular;Thin liquid   Liquid Administration via: Cup;No straw Medication Administration: Crushed with puree Supervision: Patient able to self feed;Full supervision/cueing for compensatory strategies Compensations: Small sips/bites;Slow rate Postural Changes: Seated upright at 90 degrees;Remain upright for at least 30 minutes after po intake    Other  Recommendations Oral Care Recommendations: Oral care BID   Follow up Recommendations  None    Frequency and Duration            Prognosis        Swallow Study   General HPI: 82 y.o.malewith a past medical history significant for Afib, bradycardia with pacer, GERD, chronic systolic and diastolic CHF EF 123XX123, HTN, hx CVA with residual right sided weakness and cognitive deficitswho presents with hemoptysis.CXR mild perihilar infiltrative pattern likely represents edema. Pt coughing with eggs this am and water later this afternoon per RN. Type of Study: Bedside Swallow Evaluation Previous Swallow Assessment: see HPI Diet Prior to this Study: Regular;Thin liquids Temperature Spikes Noted: No Respiratory Status: Nasal cannula History of Recent Intubation: No Behavior/Cognition: Alert;Cooperative;Pleasant mood;Requires cueing Oral Cavity Assessment: Within Functional Limits Oral Care Completed by SLP: No Oral Cavity - Dentition:  (missing several lower bilateral) Vision: Functional for self-feeding Self-Feeding Abilities: Able to feed self;Needs assist;Needs set up Patient Positioning: Upright in bed Baseline Vocal Quality: Normal Volitional Cough: Strong Volitional Swallow: Able to elicit    Oral/Motor/Sensory Function Overall Oral Motor/Sensory Function: Within functional limits   Ice Chips Ice chips: Not tested  Thin Liquid Thin Liquid: Impaired Presentation: Cup Pharyngeal  Phase  Impairments: Suspected delayed Swallow;Wet Vocal Quality    Nectar Thick Nectar Thick Liquid: Not tested   Honey Thick Honey Thick Liquid: Not tested   Puree Puree: Within functional limits   Solid   GO   Solid: Within functional limits        Houston Siren 08/26/2016,5:12 PM  Orbie Pyo Colvin Caroli.Ed [employer] [phone]

## 2016-08-26 NOTE — Progress Notes (Signed)
PROGRESS NOTE    Micheal Nicholson  [id number] DOB: 1920/05/05 DOA: 08/23/2016 PCP: [name], MD    Brief Narrative:  82 y.o. 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  Assessment & Plan:   Principal Problem:   Community acquired pneumonia - Continue current antibiotic regimen. Hemoptysis resolving on this regimen. - continue supporting therapy. - Will work on weaning off supplemental oxygen.  - Start mobilizing today. Place order for PT evaluation and treatment  Hypokalemia - Replace orally - Reassess next am.  Active Problems:   Essential hypertension -  Pt on carvedilol, stable    Chronic combined systolic and diastolic CHF, NYHA class 2 (HCC) - stable continue current regimen    AKI (acute kidney injury) ([place]) - serum creatinine trending down. - stable    Persistent atrial fibrillation (HCC) - continue B blocker - anticoagulated with Eliquis    Hemiplegia (HCC) - stable no new focal neurological deficits     Normocytic anemia   Thrombocytopenia (HCC)   Cardiac pacemaker in situ   DVT prophylaxis: Eliquis Code Status:Full Family Communication: d/c son Disposition Plan: with resolution of hemoptysis and improvement in respiratory condition   Consultants:   none   Procedures: (None   Antimicrobials: Azithromycin, Rocephin   Subjective: Pt has no new complaints. No acute issues overnight.   Objective: Vitals:   08/25/16 1120 08/25/16 1411 08/25/16 2000 08/26/16 0500  BP:  107/63 (!) 100/56 122/64  Pulse: 62 60 63 63  Resp:  18 19 (!) 26  Temp:  97.6 F (36.4 C) 97.5 F (36.4 C) 98 F (36.7 C)  TempSrc:  Oral Oral Oral  SpO2: 93% 94% 94% 95%  Weight:    71.2 kg (156 lb 15.5 oz)  Height:        Intake/Output Summary (Last 24  hours) at 08/26/16 1101 Last data filed at 08/26/16 0752  Gross per 24 hour  Intake              440 ml  Output             1525 ml  Net            -1085 ml   Filed Weights   08/24/16 0600 08/25/16 0541 08/26/16 0500  Weight: 75.2 kg (165 lb 12.6 oz) 74.7 kg (164 lb 10.9 oz) 71.2 kg (156 lb 15.5 oz)    Examination:  General exam: Appears calm and comfortable, in nad.  Respiratory system: equal chest rise, no wheezes, rhales mild Cardiovascular system: S1 & S2 heard, IRRR. No JVD, murmurs, rubs,  Gastrointestinal system: Abdomen is nondistended, soft and nontender. No organomegaly or masses felt. Normal bowel sounds heard. Central nervous system: Alert and oriented. Answers questions appropriately Extremities: no rashes Skin: No rashes, lesions or ulcers Psychiatry: Judgement and insight appear normal. Mood & affect appropriate.   Data Reviewed: I have personally reviewed following labs and imaging studies  CBC:  Recent Labs Lab 08/23/16 0244 08/23/16 0258 08/23/16 0553  WBC 6.8  --  8.3  NEUTROABS 6.0  --   --   HGB 10.3* 10.5* 10.1*  HCT 29.9* 31.0* 30.0*  MCV 84.7  --  85.0  PLT 133*  --  [id number]*   Basic Metabolic Panel:  Recent Labs Lab 08/23/16 0244 08/23/16 0258 08/24/16 0550 08/25/16 0517 08/26/16  0541  NA 141 142 140 137 138  K 3.8 3.8 3.1* 3.6 4.0  CL 110 107 106 104 104  CO2 22  --  26 25 24   GLUCOSE 150* 145* 94 104* 99  BUN 48* 40* 45* 40* 35*  CREATININE 1.67* 1.60* 1.49* 1.35* 1.34*  CALCIUM 9.2  --  8.5* 8.5* 8.6*   GFR: Estimated Creatinine Clearance: 33.9 mL/min (by C-G formula based on SCr of 1.34 mg/dL). Liver Function Tests:  Recent Labs Lab 08/23/16 0244  AST 37  ALT 37  ALKPHOS 59  BILITOT 2.7*  PROT 6.9  ALBUMIN 4.0   No results for input(s): LIPASE, AMYLASE in the last 168 hours. No results for input(s): AMMONIA in the last 168 hours. Coagulation Profile:  Recent Labs Lab 08/23/16 0244  INR 1.57   Cardiac Enzymes: No  results for input(s): CKTOTAL, CKMB, CKMBINDEX, TROPONINI in the last 168 hours. BNP (last 3 results) No results for input(s): PROBNP in the last 8760 hours. HbA1C: No results for input(s): HGBA1C in the last 72 hours. CBG: No results for input(s): GLUCAP in the last 168 hours. Lipid Profile: No results for input(s): CHOL, HDL, LDLCALC, TRIG, CHOLHDL, LDLDIRECT in the last 72 hours. Thyroid Function Tests: No results for input(s): TSH, T4TOTAL, FREET4, T3FREE, THYROIDAB in the last 72 hours. Anemia Panel: No results for input(s): VITAMINB12, FOLATE, FERRITIN, TIBC, IRON, RETICCTPCT in the last 72 hours. Sepsis Labs:  Recent Labs Lab 08/23/16 0553 08/25/16 0517  PROCALCITON 0.40 0.22    No results found for this or any previous visit (from the past 240 hour(s)).       Radiology Studies: No results found.      Scheduled Meds: . apixaban  2.5 mg Oral BID  . atorvastatin  5 mg Oral q1800  . azithromycin  250 mg Oral Q24H  . carvedilol  6.25 mg Oral BID WC  . cefTRIAXone (ROCEPHIN)  IV  1 g Intravenous Q24H  . chlorhexidine  15 mL Mouth/Throat BID  . dutasteride  0.5 mg Oral Daily  . furosemide  40 mg Intravenous Daily  . gabapentin  200 mg Oral TID  . guaiFENesin  600 mg Oral BID  . hydrocerin   Topical BID  . pantoprazole  40 mg Oral Daily  . potassium chloride  10 mEq Oral Daily  . pramipexole  0.125 mg Oral QHS  . sertraline  150 mg Oral Daily  . tamsulosin  0.4 mg Oral Daily   Continuous Infusions:    LOS: 3 days   Time spent: > 35 minutes  Velvet Bathe, MD [facility] Pager [phone]  If 7PM-7AM, please contact night-coverage [email or web address] Password [email or web address] 08/26/2016, 11:01 AM

## 2016-08-27 LAB — PROCALCITONIN

## 2016-08-27 NOTE — Progress Notes (Signed)
Writer spoke with one of pt's sons, who is at bedside. Discussed pt's weakness and possible need for O2. Son of pt stated that pt had been to [facility] before (after his stroke) and the pt liked it there. Will alert MD and SW to this information.

## 2016-08-27 NOTE — Progress Notes (Signed)
PROGRESS NOTE    Micheal Nicholson  [id number] DOB: [birth year] DOA: 08/23/2016 PCP: [name], MD    Brief Narrative:  82 y.o. 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  Assessment & Plan:   Principal Problem:   Community acquired pneumonia - Continue current antibiotic regimen. Hemoptysis continues to resolve - continue supporting therapy. - Will work on weaning off supplemental oxygen.  - Start mobilizing today. Place order for PT evaluation and treatment  Hypokalemia - Replace orally - Reassess next am.  Active Problems:   Essential hypertension -  Pt on carvedilol, stable    Chronic combined systolic and diastolic CHF, NYHA class 2 (HCC) - stable continue current regimen    AKI (acute kidney injury) ([place]) - serum creatinine trending down. - stable    Persistent atrial fibrillation (HCC) - continue B blocker - anticoagulated with Eliquis    Hemiplegia (HCC) - stable no new focal neurological deficits     Normocytic anemia   Thrombocytopenia (HCC)   Cardiac pacemaker in situ   DVT prophylaxis: Eliquis Code Status:Full Family Communication: d/c son Disposition Plan: with resolution of hemoptysis and improvement in respiratory condition   Consultants:   none   Procedures: None   Antimicrobials: Azithromycin, Rocephin   Subjective: Pt has no new complaints. No acute issues overnight.   Objective: Vitals:   08/26/16 1330 08/26/16 2124 08/27/16 0635 08/27/16 1335  BP: 109/61 108/73 106/67 112/71  Pulse: 63 64 63 63  Resp: 20 19 19 18   Temp: 97.3 F (36.3 C) 97.8 F (36.6 C) 97.8 F (36.6 C) 97.7 F (36.5 C)  TempSrc: Oral Oral Oral Oral  SpO2: 100% 100% 96% 95%  Weight:   70.3 kg (154 lb 15.7 oz)   Height:        Intake/Output  Summary (Last 24 hours) at 08/27/16 1533 Last data filed at 08/27/16 1248  Gross per 24 hour  Intake              550 ml  Output             2950 ml  Net            -2400 ml   Filed Weights   08/25/16 0541 08/26/16 0500 08/27/16 0635  Weight: 74.7 kg (164 lb 10.9 oz) 71.2 kg (156 lb 15.5 oz) 70.3 kg (154 lb 15.7 oz)    Examination:  General exam: Appears calm and comfortable, in nad.  Respiratory system: equal chest rise, no wheezes, rhales mild Cardiovascular system: S1 & S2 heard, IRRR. No JVD, murmurs, rubs,  Gastrointestinal system: Abdomen is nondistended, soft and nontender. No organomegaly or masses felt. Normal bowel sounds heard. Central nervous system: Alert and oriented. Answers questions appropriately Extremities: no rashes Skin: No rashes, lesions or ulcers Psychiatry: Judgement and insight appear normal. Mood & affect appropriate.   Data Reviewed: I have personally reviewed following labs and imaging studies  CBC:  Recent Labs Lab 08/23/16 0244 08/23/16 0258 08/23/16 0553  WBC 6.8  --  8.3  NEUTROABS 6.0  --   --   HGB 10.3* 10.5* 10.1*  HCT 29.9* 31.0* 30.0*  MCV 84.7  --  85.0  PLT 133*  --  [id number]*   Basic Metabolic Panel:  Recent Labs Lab 08/23/16 0244 08/23/16 0258 08/24/16 0550 08/25/16 0517 08/26/16  0541  NA 141 142 140 137 138  K 3.8 3.8 3.1* 3.6 4.0  CL 110 107 106 104 104  CO2 22  --  26 25 24   GLUCOSE 150* 145* 94 104* 99  BUN 48* 40* 45* 40* 35*  CREATININE 1.67* 1.60* 1.49* 1.35* 1.34*  CALCIUM 9.2  --  8.5* 8.5* 8.6*   GFR: Estimated Creatinine Clearance: 33.5 mL/min (by C-G formula based on SCr of 1.34 mg/dL). Liver Function Tests:  Recent Labs Lab 08/23/16 0244  AST 37  ALT 37  ALKPHOS 59  BILITOT 2.7*  PROT 6.9  ALBUMIN 4.0   No results for input(s): LIPASE, AMYLASE in the last 168 hours. No results for input(s): AMMONIA in the last 168 hours. Coagulation Profile:  Recent Labs Lab 08/23/16 0244  INR 1.57    Cardiac Enzymes: No results for input(s): CKTOTAL, CKMB, CKMBINDEX, TROPONINI in the last 168 hours. BNP (last 3 results) No results for input(s): PROBNP in the last 8760 hours. HbA1C: No results for input(s): HGBA1C in the last 72 hours. CBG: No results for input(s): GLUCAP in the last 168 hours. Lipid Profile: No results for input(s): CHOL, HDL, LDLCALC, TRIG, CHOLHDL, LDLDIRECT in the last 72 hours. Thyroid Function Tests: No results for input(s): TSH, T4TOTAL, FREET4, T3FREE, THYROIDAB in the last 72 hours. Anemia Panel: No results for input(s): VITAMINB12, FOLATE, FERRITIN, TIBC, IRON, RETICCTPCT in the last 72 hours. Sepsis Labs:  Recent Labs Lab 08/23/16 0553 08/25/16 0517 08/27/16 0602  PROCALCITON 0.40 0.22 <0.10    No results found for this or any previous visit (from the past 240 hour(s)).       Radiology Studies: No results found.      Scheduled Meds: . apixaban  2.5 mg Oral BID  . atorvastatin  5 mg Oral q1800  . azithromycin  250 mg Oral Q24H  . carvedilol  6.25 mg Oral BID WC  . cefTRIAXone (ROCEPHIN)  IV  1 g Intravenous Q24H  . chlorhexidine  15 mL Mouth/Throat BID  . dutasteride  0.5 mg Oral Daily  . furosemide  40 mg Intravenous Daily  . gabapentin  200 mg Oral TID  . guaiFENesin  600 mg Oral BID  . hydrocerin   Topical BID  . pantoprazole  40 mg Oral Daily  . potassium chloride  10 mEq Oral Daily  . pramipexole  0.125 mg Oral QHS  . sertraline  150 mg Oral Daily  . tamsulosin  0.4 mg Oral Daily   Continuous Infusions:    LOS: 4 days   Time spent: > 35 minutes  Velvet Bathe, MD [facility] Pager [phone]  If 7PM-7AM, please contact night-coverage [email or web address] Password [facility] 08/27/2016, 3:33 PM

## 2016-08-27 NOTE — Progress Notes (Signed)
Occupational Therapy Evaluation Patient Details Name: Micheal Nicholson MRN: [id number] DOB: 09-21-1920 Today's Date: 08/27/2016    History of Present Illness 82 y.o. 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   Clinical Impression   Limited evaluation this date due to lethargy but pt requires assistance with all ADLs and mobility at this time. Will benefit from skilled OT to maximize function and to facilitate a safe discharge. OT will follow.    Follow Up Recommendations  Supervision/Assistance - 24 hour;SNF    Equipment Recommendations  Other (comment) (tbd at next venue of care)    Recommendations for Other Services       Precautions / Restrictions Precautions Precautions: Fall      Mobility Bed Mobility               General bed mobility comments: total A +2 to reposition in bed  Transfers                 General transfer comment: pt too lethargic    Balance                                            ADL Overall ADL's : Needs assistance/impaired                                       General ADL Comments: Patient in bed, sleepy, arouses to voice, nurse in and reports she has been unable to give patient meds or assist with lunch because he has been so lethargic. Limited OT evaluation but PT reports pt dropping fork, etc. Simulated pt holding milk carton and utensil and bringing to mouth. Patient dropped spoon instantly. Noted he used B hands to bring milk carton to mouth. Will likely benefit from feeding adaptive equipment; will address further next session. Assisted pt with repositioning in bed, nurse also helped total A +2.     Vision     Perception     Praxis      Pertinent Vitals/Pain Pain Assessment: No/denies  pain     Hand Dominance Right   Extremity/Trunk Assessment Upper Extremity Assessment Upper Extremity Assessment: Generalized weakness (also tends to drop items placed in hands)   Lower Extremity Assessment Lower Extremity Assessment: Defer to PT evaluation       Communication Communication Communication: HOH   Cognition Arousal/Alertness: Lethargic Behavior During Therapy: Restless Overall Cognitive Status: No family/caregiver present to determine baseline cognitive functioning                     General Comments       Exercises       Shoulder Instructions      Home Living Family/patient expects to be discharged to:: Private residence Living Arrangements: Children Available Help at Discharge: Family Type of Home: House Home Access: Stairs to enter Technical brewer of Steps: 2   Home Layout: One level     Bathroom Shower/Tub: Occupational psychologist: Standard     Home Equipment: Environmental consultant - 2 wheels;Cane - single point;Shower seat   Additional Comments: info taken from previous admission therapy  notes      Prior Functioning/Environment          Comments: unknown and pt unable to state    OT Diagnosis: Generalized weakness   OT Problem List: Decreased strength;Decreased range of motion;Decreased activity tolerance;Impaired balance (sitting and/or standing);Decreased safety awareness;Decreased cognition;Decreased knowledge of precautions;Decreased knowledge of use of DME or AE   OT Treatment/Interventions: Self-care/ADL training;DME and/or AE instruction;Therapeutic activities;Patient/family education;Therapeutic exercise    OT Goals(Current goals can be found in the care plan section) Acute Rehab OT Goals Patient Stated Goal: none stated OT Goal Formulation: Patient unable to participate in goal setting Time For Goal Achievement: 09/10/16 Potential to Achieve Goals: Good ADL Goals Pt Will Perform Eating: with set-up;with  adaptive utensils;sitting Pt Will Perform Grooming: with set-up;sitting;with adaptive equipment Pt Will Transfer to Toilet: with supervision;stand pivot transfer;ambulating;bedside commode Pt Will Perform Toileting - Clothing Manipulation and hygiene: with supervision;sit to/from stand  OT Frequency: Min 2X/week   Barriers to D/C:            Co-evaluation              End of Session Nurse Communication: Mobility status;Other (comment) (pt's lethargy)  Activity Tolerance: Patient limited by lethargy Patient left: in bed;with call bell/phone within reach;with nursing/sitter in room   Time: [id number] OT Time Calculation (min): 12 min Charges:  OT General Charges $OT Visit: 1 Procedure OT Evaluation $OT Eval Moderate Complexity: 1 Procedure G-Codes:    Rasheda Ledger A September 20, 2016, 12:17 PM

## 2016-08-27 NOTE — Evaluation (Signed)
Physical Therapy Evaluation Patient Details Name: Micheal Nicholson MRN: [id number] DOB: 06-03-1920 Today's Date: 08/27/2016   History of Present Illness  82 y.o. 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  Clinical Impression  Pt admitted as above and presenting with functional mobility limitations 2* generalized weakness and balance deficits. Dependent on acute stay progress, pt could benefit from follow up rehab at SNF level to maximize IND and safety prior to return home with ltd assist.    Follow Up Recommendations SNF;Supervision/Assistance - 24 hour    Equipment Recommendations  None recommended by PT    Recommendations for Other Services OT consult     Precautions / Restrictions Precautions Precautions: Fall Restrictions Weight Bearing Restrictions: No      Mobility  Bed Mobility Overal bed mobility: Needs Assistance Bed Mobility: Supine to Sit     Supine to sit: Min assist     General bed mobility comments: min assist and use of rail to move supine to sitting  Transfers Overall transfer level: Needs assistance Equipment used: Rolling walker (2 wheeled) Transfers: Sit to/from Stand Sit to Stand: Min assist;Mod assist         General transfer comment: cues for safe transition position and use of UEs to self assist.  Assist to bring wt up and fwd  Ambulation/Gait Ambulation/Gait assistance: Min assist;Mod assist Ambulation Distance (Feet): 140 Feet Assistive device: Rolling walker (2 wheeled) Gait Pattern/deviations: Step-through pattern;Decreased step length - right;Decreased step length - left;Shuffle;Trunk flexed;Wide base of support Gait velocity: decr Gait velocity interpretation: Below normal speed for age/gender General Gait Details: Cues for posture,  position from RW and saftey awareness.  Physical assist for balance and to manage RW.  Multiple short standing rests to complete task  Stairs            Wheelchair Mobility    Modified Rankin (Stroke Patients Only)       Balance Overall balance assessment: Needs assistance Sitting-balance support: No upper extremity supported;Feet supported Sitting balance-Leahy Scale: Good     Standing balance support: Bilateral upper extremity supported Standing balance-Leahy Scale: Poor Standing balance comment: posterior drift                             Pertinent Vitals/Pain Pain Assessment: No/denies pain    Home Living Family/patient expects to be discharged to:: Private residence Living Arrangements: Children Available Help at Discharge: Family Type of Home: House Home Access: Stairs to enter;Ramped entrance   Entrance Stairs-Number of Steps: 2 Home Layout: One level Home Equipment: Walker - 4 wheels;Cane - single point Additional Comments: Pt states he lives with son who is limited in ability to assist 2* cardiac issues    Prior Function Level of Independence: Independent with assistive device(s)         Comments: Pt states he ambulated ltd distances with rollator but was not sure if he used O2 at home. "I dont know but maybe I'm supposed to?"     Hand Dominance   Dominant Hand: Right    Extremity/Trunk Assessment   Upper Extremity Assessment: Generalized weakness;Defer to OT evaluation           Lower Extremity Assessment: Generalized weakness      Cervical / Trunk Assessment: Kyphotic  Communication   Communication:  HOH  Cognition Arousal/Alertness: Awake/alert Behavior During Therapy: WFL for tasks assessed/performed Overall Cognitive Status: No family/caregiver present to determine baseline cognitive functioning                      General Comments      Exercises        Assessment/Plan    PT Assessment Patient  needs continued PT services  PT Diagnosis Difficulty walking   PT Problem List Decreased strength;Decreased range of motion;Decreased activity tolerance;Decreased mobility;Decreased balance;Decreased knowledge of use of DME;Decreased safety awareness;Decreased cognition  PT Treatment Interventions DME instruction;Gait training;Functional mobility training;Therapeutic activities;Therapeutic exercise;Balance training;Patient/family education   PT Goals (Current goals can be found in the Care Plan section) Acute Rehab PT Goals Patient Stated Goal: get out of bed PT Goal Formulation: With patient Time For Goal Achievement: 09/09/16 Potential to Achieve Goals: Fair    Frequency Min 3X/week   Barriers to discharge        Co-evaluation               End of Session Equipment Utilized During Treatment: Gait belt;Oxygen Activity Tolerance: Patient tolerated treatment well Patient left: in chair;with call bell/phone within reach;with chair alarm set Nurse Communication: Mobility status         Time: [id number] PT Time Calculation (min) (ACUTE ONLY): 43 min   Charges:   PT Evaluation $PT Eval Low Complexity: 1 Procedure PT Treatments $Gait Training: 23-37 mins   PT G Codes:        Harris Kistler 2016-09-04, 12:56 PM

## 2016-08-28 DIAGNOSIS — I69359 Hemiplegia and hemiparesis following cerebral infarction affecting unspecified side: Secondary | ICD-10-CM | POA: Diagnosis not present

## 2016-08-28 DIAGNOSIS — I5042 Chronic combined systolic (congestive) and diastolic (congestive) heart failure: Secondary | ICD-10-CM | POA: Diagnosis not present

## 2016-08-28 DIAGNOSIS — M6281 Muscle weakness (generalized): Secondary | ICD-10-CM | POA: Diagnosis not present

## 2016-08-28 DIAGNOSIS — I10 Essential (primary) hypertension: Secondary | ICD-10-CM | POA: Diagnosis not present

## 2016-08-28 DIAGNOSIS — R41841 Cognitive communication deficit: Secondary | ICD-10-CM | POA: Diagnosis not present

## 2016-08-28 DIAGNOSIS — I5022 Chronic systolic (congestive) heart failure: Secondary | ICD-10-CM | POA: Diagnosis not present

## 2016-08-28 DIAGNOSIS — Z5181 Encounter for therapeutic drug level monitoring: Secondary | ICD-10-CM | POA: Diagnosis not present

## 2016-08-28 DIAGNOSIS — I638 Other cerebral infarction: Secondary | ICD-10-CM | POA: Diagnosis not present

## 2016-08-28 DIAGNOSIS — I69351 Hemiplegia and hemiparesis following cerebral infarction affecting right dominant side: Secondary | ICD-10-CM | POA: Diagnosis not present

## 2016-08-28 DIAGNOSIS — N400 Enlarged prostate without lower urinary tract symptoms: Secondary | ICD-10-CM | POA: Diagnosis not present

## 2016-08-28 DIAGNOSIS — D696 Thrombocytopenia, unspecified: Secondary | ICD-10-CM | POA: Diagnosis not present

## 2016-08-28 DIAGNOSIS — F331 Major depressive disorder, recurrent, moderate: Secondary | ICD-10-CM | POA: Diagnosis not present

## 2016-08-28 DIAGNOSIS — E46 Unspecified protein-calorie malnutrition: Secondary | ICD-10-CM | POA: Diagnosis not present

## 2016-08-28 DIAGNOSIS — I482 Chronic atrial fibrillation: Secondary | ICD-10-CM | POA: Diagnosis not present

## 2016-08-28 DIAGNOSIS — J189 Pneumonia, unspecified organism: Secondary | ICD-10-CM | POA: Diagnosis not present

## 2016-08-28 DIAGNOSIS — N179 Acute kidney failure, unspecified: Secondary | ICD-10-CM | POA: Diagnosis not present

## 2016-08-28 DIAGNOSIS — I5023 Acute on chronic systolic (congestive) heart failure: Secondary | ICD-10-CM | POA: Diagnosis not present

## 2016-08-28 MED ORDER — APIXABAN 2.5 MG PO TABS: 2.5000 mg | ORAL_TABLET | Freq: Two times a day (BID) | ORAL | 0 refills | Status: DC

## 2016-08-28 MED ORDER — ATORVASTATIN CALCIUM 10 MG PO TABS: 5.0000 mg | ORAL_TABLET | Freq: Every day | ORAL | 0 refills | Status: DC

## 2016-08-28 MED ORDER — ALPRAZOLAM 0.5 MG PO TABS: 0.5000 mg | ORAL_TABLET | Freq: Two times a day (BID) | ORAL | 0 refills | Status: DC | PRN

## 2016-08-28 MED ORDER — CEFDINIR 300 MG PO CAPS: 300.0000 mg | ORAL_CAPSULE | Freq: Two times a day (BID) | ORAL | 0 refills | Status: AC

## 2016-08-28 NOTE — NC FL2 (Signed)
[place] LEVEL OF CARE SCREENING TOOL     IDENTIFICATION  Patient Name: Micheal Nicholson Birthdate: 1920/07/04 Sex: male Admission Date (Current Location): 08/23/2016  [facility] and [place] Number:  Herbalist and Address:  [facility],  [place] [address], [place]      Provider Number: [id number]  Attending Physician Name and Address:  Velvet Bathe, MD  Relative Name and Phone Number:       Current Level of Care: Hospital Recommended Level of Care: [place] Prior Approval Number:    Date Approved/Denied:   PASRR Number:    Discharge Plan: SNF    Current Diagnoses: Patient Active Problem List   Diagnosis Date Noted  . Hypokalemia 08/24/2016  . Community acquired pneumonia 08/23/2016  . Pruritus 08/09/2016  . Chronic anticoagulation 04/28/2016  . Cardiac pacemaker in situ 04/28/2016  . Chronic systolic congestive heart failure ([place]) 04/28/2016  . Left hemiparesis ([place]) 04/25/2016  . Encounter for therapeutic drug monitoring 04/19/2016  . Cardiomyopathy, ischemic   . Cerebral embolism with cerebral infarction 03/03/2016  . Normocytic anemia   . Thrombocytopenia ([place])   . Acute left hemiparesis ([place])   . Peripheral neuropathic pain ([place])   . Hemiplegia ([place]) 03/02/2016  . Adenopathy   . Long term current use of anticoagulant therapy 01/23/2016  . CAD (coronary artery disease) 01/23/2016  . Second degree Mobitz II AV block 01/13/2016  . Chronic systolic CHF (congestive heart failure) ([place])   . Persistent atrial fibrillation ([place])   . Atrial fibrillation with slow ventricular response ([place])   . AKI (acute kidney injury) ([place])   . Bradycardia 12/29/2015  . Acute renal failure (ARF) ([place]) 12/29/2015  . Hyponatremia 12/29/2015  . Hypotension 12/29/2015  . Elevated troponin 12/29/2015  . Acute upper respiratory infection 12/23/2015  . Chronic combined systolic and diastolic CHF, NYHA class 2 ([place])  04/17/2015  . Vitamin D deficiency 03/11/2015  . Cardiomyopathy -etiology uncertain [id number]  . Paroxysmal atrial fibrillation ([place]) 08/27/2014  . 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 H/O: stroke 08/27/2014  . GERD (gastroesophageal reflux disease) 02/07/2011  . BPH (benign prostatic hypertrophy) 07/12/2010  . Anxiety state 11/02/2009  . Essential hypertension 11/02/2009    Orientation RESPIRATION BLADDER Height & Weight     Self, Place  Normal Incontinent, External catheter Weight: 159 lb 2.8 oz (72.2 kg) Height:  5\' 10"  (177.8 cm)  BEHAVIORAL SYMPTOMS/MOOD NEUROLOGICAL BOWEL NUTRITION STATUS      Continent Diet (Heart Healthy- Thin Liquids)  AMBULATORY STATUS COMMUNICATION OF NEEDS Skin   Limited Assist Verbally Other (Comment) (Wound: Burn/Leg/Left Anterior )                       Personal Care Assistance Level of Assistance  Bathing, Feeding, Dressing Bathing Assistance: Limited assistance Feeding assistance: Independent Dressing Assistance: Limited assistance     Functional Limitations Info  Sight, Hearing, Speech Sight Info: Adequate Hearing Info: Adequate Speech Info: Adequate    SPECIAL CARE FACTORS FREQUENCY  PT (By licensed PT), OT (By licensed OT)     PT Frequency: 3 OT Frequency: 2            Contractures Contractures Info: Not present    Additional Factors Info  Code Status, Allergies, Psychotropic Code Status Info: Full Code Allergies Info:  Chocolate           Current Medications (08/28/2016):  This is the current hospital active medication list Current Facility-Administered Medications  Medication Dose Route Frequency Provider Last Rate Last Dose  . acetaminophen (TYLENOL) tablet 650 mg  650 mg Oral Q6H PRN Edwin Dada, MD       Or  . acetaminophen (TYLENOL) suppository 650 mg  650 mg Rectal Q6H PRN Edwin Dada, MD      . albuterol (PROVENTIL) (2.5 MG/3ML) 0.083% nebulizer solution 2.5 mg  2.5 mg Nebulization Q2H  PRN Edwin Dada, MD      . ALPRAZolam Duanne Moron) tablet 0.5 mg  0.5 mg Oral BID PRN Edwin Dada, MD   0.5 mg at 08/28/16 0337  . apixaban (ELIQUIS) tablet 2.5 mg  2.5 mg Oral BID Edwin Dada, MD   2.5 mg at 08/27/16 2243  . atorvastatin (LIPITOR) tablet 5 mg  5 mg Oral q1800 Edwin Dada, MD   5 mg at 08/27/16 1717  . azithromycin (ZITHROMAX) tablet 250 mg  250 mg Oral Q24H Edwin Dada, MD   250 mg at 08/28/16 0746  . carvedilol (COREG) tablet 6.25 mg  6.25 mg Oral BID WC Edwin Dada, MD   6.25 mg at 08/28/16 0746  . cefTRIAXone (ROCEPHIN) 1 g in dextrose 5 % 50 mL IVPB  1 g Intravenous Q24H Edwin Dada, MD   1 g at 08/28/16 0337  . chlorhexidine (PERIDEX) 0.12 % solution 15 mL  15 mL Mouth/Throat BID April Palumbo, MD   15 mL at 08/27/16 2243  . diphenhydrAMINE (BENADRYL) capsule 25 mg  25 mg Oral Q4H PRN Jeryl Columbia, NP   25 mg at 08/28/16 0048  . dutasteride (AVODART) capsule 0.5 mg  0.5 mg Oral Daily Edwin Dada, MD   0.5 mg at 08/27/16 1113  . furosemide (LASIX) injection 40 mg  40 mg Intravenous Daily Edwin Dada, MD   40 mg at 08/27/16 1114  . gabapentin (NEURONTIN) capsule 200 mg  200 mg Oral TID Edwin Dada, MD   200 mg at 08/27/16 2243  . guaiFENesin (MUCINEX) 12 hr tablet 600 mg  600 mg Oral BID Edwin Dada, MD   600 mg at 08/27/16 2243  . hydrocerin (EUCERIN) cream   Topical BID Velvet Bathe, MD      . pantoprazole (PROTONIX) EC tablet 40 mg  40 mg Oral Daily Edwin Dada, MD   40 mg at 08/27/16 1113  . polyethylene glycol (MIRALAX / GLYCOLAX) packet 17 g  17 g Oral Daily PRN Edwin Dada, MD      . potassium chloride (K-DUR,KLOR-CON) CR tablet 10 mEq  10 mEq Oral Daily Edwin Dada, MD   10 mEq at 08/27/16 1113  . pramipexole (MIRAPEX) tablet 0.125 mg  0.125 mg Oral QHS Edwin Dada, MD   0.125 mg at 08/27/16 2243  . senna-docusate  (Senokot-S) tablet 1 tablet  1 tablet Oral QHS PRN Edwin Dada, MD      . sertraline (ZOLOFT) tablet 150 mg  150 mg Oral Daily Edwin Dada, MD   150 mg at 08/27/16 1113  . tamsulosin (FLOMAX) capsule 0.4 mg  0.4 mg Oral Daily Edwin Dada, MD   0.4 mg at 08/27/16 1113     Discharge Medications: Please see discharge summary for a list of discharge medications.  Relevant Imaging Results:  Relevant Lab Results:   Additional Information SSN [id number]  Lia Hopping, LCSW

## 2016-08-28 NOTE — Clinical Social Work Note (Signed)
Clinical Social Work Assessment  Patient Details  Name: Micheal Nicholson MRN: [id number] Date of Birth: February 21, 1920  Date of referral:  08/28/16               Reason for consult:  Facility Placement                Permission sought to share information with:  Facility Sport and exercise psychologist, Family Supports Permission granted to share information::     Name::        Agency::     Relationship::   [employer] and instrumentation engineer Information:   [id number]  Housing/Transportation Living arrangements for the past 2 months:  Single Family Home Source of Information:  Adult Children Patient Interpreter Needed:  None Criminal Activity/Legal Involvement Pertinent to Current Situation/Hospitalization:    Significant Relationships:  None Lives with:  Adult Children Do you feel safe going back to the place where you live?  Yes Need for family participation in patient care:  No (Coment)  Care giving concerns: Since patient had a stroke his son reports his ability to manage at home alone, and his ability to ambulate has declined. The patient has pneumonia. The son reports at times the patient will become so weak he will fall down and sit wherever he can. Family believes patient will benefit from rehab before going home.    Social Worker assessment / plan: Patient sleeping soundly at time of assessment. LCSWA gathered collateral information from patient son Gene via phone. Son reports the patient has been living at home with other adult son who also has health issues, they support each other. The patient has been to SNF-[facility] in the past and family wants patient to return if possible.   Employment status:  Retired Nurse, adult PT Recommendations:  [place] / Referral to community resources:  [place]  Patient/Family's Response to care:  Agreeable and responding to care.   Patient/Family's Understanding of and Emotional  Response to Diagnosis, Current Treatment, and Prognosis:  "He is a strong 82 year old, he likes to do things on his own but right now he can benefit from going to somewhere like [facility]." Plan: Assist with patient disposition.   Emotional Assessment Appearance:  Appears older than stated age Attitude/Demeanor/Rapport:    Affect (typically observed):   (Resting) Orientation:  Oriented to Self, Oriented to Place Alcohol / Substance use:  Not Applicable Psych involvement (Current and /or in the community):  No   Discharge Needs  Concerns to be addressed:  Care Coordination, Discharge Planning Concerns Readmission within the last 30 days:  Yes Current discharge risk:  None Barriers to Discharge:  No Barriers Identified   Lia Hopping, LCSW 08/28/2016, 11:02 AM

## 2016-08-28 NOTE — Progress Notes (Signed)
RN given report.  [facility] called for transport. Scripts received and Placed.

## 2016-08-28 NOTE — Discharge Summary (Signed)
Physician Discharge Summary  Micheal Nicholson [id number] DOB: [birth year] DOA: 08/23/2016  PCP: [name], MD  Admit date: 08/23/2016 Discharge date: 08/28/2016  Time spent: > 35 minutes  Recommendations for Outpatient Follow-up:  1. Patient to continue antibiotics for 2 more days of complete an 8 day treatment course.   Discharge Diagnoses:  Principal Problem:   Community acquired pneumonia Active Problems:   Essential hypertension   Chronic combined systolic and diastolic CHF, NYHA class 2 (HCC)   AKI (acute kidney injury) ([place])   Persistent atrial fibrillation (HCC)   Hemiplegia (HCC)   Normocytic anemia   Thrombocytopenia (HCC)   Cardiac pacemaker in situ   Hypokalemia   Discharge Condition:Stable  Diet recommendation: Heart healthy  Filed Weights   08/26/16 0500 08/27/16 0635 08/28/16 0501  Weight: 71.2 kg (156 lb 15.5 oz) 70.3 kg (154 lb 15.7 oz) 72.2 kg (159 lb 2.8 oz)    History of present illness:  82 y.o.malewith a past medical history significant for Afib on Eliquis, bradycardia with pacer, chronic systolic and diastolic CHF EF [id number], HTN, hx CVA with residual right sided weakness and cognitive deficitswho presents with hemoptysis.  Hospital Course:  Principal Problem:   Community acquired pneumonia/hemoptysis -hemoptysis resolved after antibiotic administration - d/c to SNF so patient can have continued PT  Hypokalemia - Resolved after replacement  Active Problems:   Essential hypertension -  Pt on carvedilol, stable    Chronic combined systolic and diastolic CHF, NYHA class 2 (HCC) - continue home medication regimen on d/c    AKI (acute kidney injury) ([place]) - Recommend monitoring serum creatinine    Persistent atrial fibrillation ([place]) - continue B blocker - anticoagulated with Eliquis    Hemiplegia ([place]) - stable no new focal neurological deficits     Normocytic anemia   Thrombocytopenia (HCC)   Cardiac pacemaker  in situ  Procedures:  None  Consultations:  none  Discharge Exam: Vitals:   08/27/16 2000 08/28/16 0501  BP: (!) 100/53 (!) 108/57  Pulse: 61 68  Resp: 18 17  Temp: 98.1 F (36.7 C) 98 F (36.7 C)    General: Pt in nad, alert and awake Cardiovascular: rrr, no rubs Respiratory: no increased wob, no wheezes  Discharge Instructions   Discharge Instructions    Call MD for:  difficulty breathing, headache or visual disturbances    Complete by:  As directed   Call MD for:  temperature >100.4    Complete by:  As directed   Diet - low sodium heart healthy    Complete by:  As directed   Discharge instructions    Complete by:  As directed   Please ensure patient follows up with her primary care physician for further evaluation recommendations. Hemoptysis has resolved with antibiotics   Increase activity slowly    Complete by:  As directed     Current Discharge Medication List    START taking these medications   Details  cefdinir (OMNICEF) 300 MG capsule Take 1 capsule (300 mg total) by mouth 2 (two) times daily. Qty: 4 capsule, Refills: 0      CONTINUE these medications which have CHANGED   Details  apixaban (ELIQUIS) 2.5 MG TABS tablet Take 1 tablet (2.5 mg total) by mouth 2 (two) times daily. Qty: 60 tablet, Refills: 0    atorvastatin (LIPITOR) 10 MG tablet Take 0.5 tablets (5 mg total) by mouth daily at 6 PM. Qty: 30 tablet, Refills: 0      CONTINUE these  medications which have NOT CHANGED   Details  ALPRAZolam (XANAX) 0.5 MG tablet Take 1 tablet (0.5 mg total) by mouth 2 (two) times daily as needed for anxiety. Qty: 60 tablet, Refills: 3    carvedilol (COREG) 3.125 MG tablet Take 2 tablets (6.25 mg total) by mouth 2 (two) times daily with a meal. Qty: 120 tablet, Refills: 5    dutasteride (AVODART) 0.5 MG capsule Take 1 capsule (0.5 mg total) by mouth daily. Qty: 90 capsule, Refills: 1    fluticasone (FLONASE) 50 MCG/ACT nasal spray Place 2 sprays into both  nostrils 2 (two) times daily as needed for rhinitis.     !! furosemide (LASIX) 20 MG tablet Take 20 mg by mouth every Monday, Wednesday, and Friday.    !! furosemide (LASIX) 40 MG tablet Take 1 tablet (40 mg total) by mouth daily. Qty: 90 tablet, Refills: 3    gabapentin (NEURONTIN) 100 MG capsule Take 200 mg by mouth 3 (three) times daily.    lisinopril (PRINIVIL,ZESTRIL) 10 MG tablet Take 10 mg by mouth daily.    omeprazole (PRILOSEC) 20 MG capsule Take 20 mg by mouth daily.    polyethylene glycol (MIRALAX / GLYCOLAX) packet Take 17 g by mouth daily as needed for mild constipation.    potassium chloride (K-DUR,KLOR-CON) 10 MEQ tablet Take 10 mEq by mouth daily.    pramipexole (MIRAPEX) 0.125 MG tablet Take 0.125 mg by mouth at bedtime.    senna-docusate (SENOKOT-S) 8.6-50 MG tablet Take 1 tablet by mouth at bedtime as needed for mild constipation. Qty: 10 tablet, Refills: 0    sertraline (ZOLOFT) 100 MG tablet Take 150 mg by mouth daily.    tamsulosin (FLOMAX) 0.4 MG CAPS capsule Take 1 capsule (0.4 mg total) by mouth daily. Qty: 90 capsule, Refills: 1    Triamcinolone Acetonide (TRIAMCINOLONE 0.1 % CREAM : EUCERIN) CREA Apply 1 application topically 2 (two) times daily as needed for rash, itching or irritation.     !! - Potential duplicate medications found. Please discuss with provider.     Allergies  Allergen Reactions  . Chocolate Other (See Comments)    Reaction:  Acid reflux       The results of significant diagnostics from this hospitalization (including imaging, microbiology, ancillary and laboratory) are listed below for reference.    Significant Diagnostic Studies: Dg Chest 2 View  Result Date: 08/23/2016 CLINICAL DATA:  Coughing up blood for 6 months. Worsening over 24 hours. Short of breath. EXAM: CHEST  2 VIEW COMPARISON:  05/05/2016 FINDINGS: Cardiac pacemaker. Shallow inspiration with atelectasis in the lung bases. Borderline heart size with increased  pulmonary vascularity. Mild perihilar infiltrative pattern likely represents edema. Infiltration in the left lung base behind the heart. Can't exclude superimposed pneumonia although this may be asymmetric edema. No blunting of costophrenic angles. No pneumothorax. Calcification of the aorta. IMPRESSION: Cardiac enlargement with pulmonary vascular congestion and perihilar edema. Atelectasis in the lung bases. Possible superimposed consolidation in the left lung base. Electronically Signed   By: Lucienne Capers M.D.   On: 08/23/2016 03:20    Microbiology: No results found for this or any previous visit (from the past 240 hour(s)).   Labs: Basic Metabolic Panel:  Recent Labs Lab 08/23/16 0244 08/23/16 0258 08/24/16 0550 08/25/16 0517 08/26/16 0541  NA 141 142 140 137 138  K 3.8 3.8 3.1* 3.6 4.0  CL 110 107 106 104 104  CO2 22  --  26 25 24   GLUCOSE 150* 145* 94  104* 99  BUN 48* 40* 45* 40* 35*  CREATININE 1.67* 1.60* 1.49* 1.35* 1.34*  CALCIUM 9.2  --  8.5* 8.5* 8.6*   Liver Function Tests:  Recent Labs Lab 08/23/16 0244  AST 37  ALT 37  ALKPHOS 59  BILITOT 2.7*  PROT 6.9  ALBUMIN 4.0   No results for input(s): LIPASE, AMYLASE in the last 168 hours. No results for input(s): AMMONIA in the last 168 hours. CBC:  Recent Labs Lab 08/23/16 0244 08/23/16 0258 08/23/16 0553  WBC 6.8  --  8.3  NEUTROABS 6.0  --   --   HGB 10.3* 10.5* 10.1*  HCT 29.9* 31.0* 30.0*  MCV 84.7  --  85.0  PLT 133*  --  140*   Cardiac Enzymes: No results for input(s): CKTOTAL, CKMB, CKMBINDEX, TROPONINI in the last 168 hours. BNP: BNP (last 3 results)  Recent Labs  05/05/16 1904 08/23/16 0244  BNP 542.1* 1,296.9*    ProBNP (last 3 results) No results for input(s): PROBNP in the last 8760 hours.  CBG: No results for input(s): GLUCAP in the last 168 hours.   Signed:  Velvet Bathe MD.  [facility] 08/28/2016, 2:49 PM

## 2016-08-28 NOTE — Progress Notes (Signed)
Pt leaving at this time with EMS, headed to [facility] SNF. Family aware of transfer.  Pt alert, and oriented to transfer.

## 2016-08-28 NOTE — Progress Notes (Signed)
Patient has been accepted to [facility]. Son Gene to complete admission paperwork today at 3:00pm.

## 2016-08-28 NOTE — Progress Notes (Signed)
Date: August 28, 2016 Discharge orders checked for needs. No needs present at time of discharge. Nautika Cressey, RN, BSN, CCM   [phone] 

## 2016-08-28 NOTE — Progress Notes (Signed)
Writer gave report to receiving nurse at [facility] SNF.

## 2016-08-28 NOTE — Clinical Social Work Placement (Signed)
   CLINICAL SOCIAL WORK PLACEMENT  NOTE  Date:  08/28/2016  Patient Details  Name: Micheal Nicholson MRN: [id number] Date of Birth: 10-01-1920  Clinical Social Work is seeking post-discharge placement for this patient at the [place] level of care (*CSW will initial, date and re-position this form in  chart as items are completed):  Yes   Patient/family provided with [facility] Work Department's list of facilities offering this level of care within the geographic area requested by the patient (or if unable, by the patient's family).  Yes   Patient/family informed of their freedom to choose among providers that offer the needed level of care, that participate in Medicare, Medicaid or managed care program needed by the patient, have an available bed and are willing to accept the patient.  Yes   Patient/family informed of [facility]'s ownership interest in [facility] and [facility], as well as of the fact that they are under no obligation to receive care at these facilities.  PASRR submitted to EDS on       PASRR number received on       Existing PASRR number confirmed on 08/28/16     FL2 transmitted to all facilities in geographic area requested by pt/family on       FL2 transmitted to all facilities within larger geographic area on 08/28/16     Patient informed that his/her managed care company has contracts with or will negotiate with certain facilities, including the following:        Yes   Patient/family informed of bed offers received.  Patient chooses bed at [facility]     Physician recommends and patient chooses bed at [facility]    Patient to be transferred to [facility] on 08/28/16.  Patient to be transferred to facility by [facility]     Patient family notified on 08/28/16 of transfer.  Name of family member notified:  Micheal Nicholson      PHYSICIAN       Additional Comment:     _______________________________________________ Lia Hopping, LCSW 08/28/2016, 2:58 PM

## 2016-08-29 DIAGNOSIS — I638 Other cerebral infarction: Secondary | ICD-10-CM | POA: Diagnosis not present

## 2016-08-29 DIAGNOSIS — I5042 Chronic combined systolic (congestive) and diastolic (congestive) heart failure: Secondary | ICD-10-CM | POA: Diagnosis not present

## 2016-08-29 DIAGNOSIS — J189 Pneumonia, unspecified organism: Secondary | ICD-10-CM | POA: Diagnosis not present

## 2016-08-29 DIAGNOSIS — I482 Chronic atrial fibrillation: Secondary | ICD-10-CM | POA: Diagnosis not present

## 2016-09-01 DIAGNOSIS — N179 Acute kidney failure, unspecified: Secondary | ICD-10-CM | POA: Diagnosis not present

## 2016-09-01 DIAGNOSIS — I482 Chronic atrial fibrillation: Secondary | ICD-10-CM | POA: Diagnosis not present

## 2016-09-01 DIAGNOSIS — D696 Thrombocytopenia, unspecified: Secondary | ICD-10-CM | POA: Diagnosis not present

## 2016-09-01 DIAGNOSIS — E46 Unspecified protein-calorie malnutrition: Secondary | ICD-10-CM | POA: Diagnosis not present

## 2016-09-01 DIAGNOSIS — N400 Enlarged prostate without lower urinary tract symptoms: Secondary | ICD-10-CM | POA: Diagnosis not present

## 2016-09-01 DIAGNOSIS — J189 Pneumonia, unspecified organism: Secondary | ICD-10-CM | POA: Diagnosis not present

## 2016-09-01 DIAGNOSIS — I5022 Chronic systolic (congestive) heart failure: Secondary | ICD-10-CM | POA: Diagnosis not present

## 2016-09-01 DIAGNOSIS — I69359 Hemiplegia and hemiparesis following cerebral infarction affecting unspecified side: Secondary | ICD-10-CM | POA: Diagnosis not present

## 2016-09-08 ENCOUNTER — Other Ambulatory Visit: Payer: Self-pay | Admitting: Cardiovascular Disease

## 2016-09-09 DIAGNOSIS — M25551 Pain in right hip: Secondary | ICD-10-CM | POA: Diagnosis not present

## 2016-09-09 DIAGNOSIS — I5042 Chronic combined systolic (congestive) and diastolic (congestive) heart failure: Secondary | ICD-10-CM | POA: Diagnosis not present

## 2016-09-09 DIAGNOSIS — I482 Chronic atrial fibrillation: Secondary | ICD-10-CM | POA: Diagnosis not present

## 2016-09-09 DIAGNOSIS — I69351 Hemiplegia and hemiparesis following cerebral infarction affecting right dominant side: Secondary | ICD-10-CM | POA: Diagnosis not present

## 2016-09-09 DIAGNOSIS — I110 Hypertensive heart disease with heart failure: Secondary | ICD-10-CM | POA: Diagnosis not present

## 2016-09-11 ENCOUNTER — Telehealth: Payer: Self-pay

## 2016-09-11 NOTE — Telephone Encounter (Signed)
I called Micheal Nicholson and informed him of the message below and Dr Elease Hashimoto reviewed the message again and stated he is approved for a home health aide as well.

## 2016-09-11 NOTE — Telephone Encounter (Signed)
Okay to extend physical therapy and get Education [employer], [employer] as requested

## 2016-09-11 NOTE — Telephone Encounter (Signed)
Rx(s) sent to pharmacy electronically.  

## 2016-09-11 NOTE — Telephone Encounter (Signed)
Sree from [facility] called requesting verbal order for patient. He would like to extend PT for 2x per week for 4 more weeks, Home Aid for 2x per week for 3 weeks and [employer] [facility], [employer]. Please advise.

## 2016-09-12 DIAGNOSIS — I5042 Chronic combined systolic (congestive) and diastolic (congestive) heart failure: Secondary | ICD-10-CM | POA: Diagnosis not present

## 2016-09-12 DIAGNOSIS — I69351 Hemiplegia and hemiparesis following cerebral infarction affecting right dominant side: Secondary | ICD-10-CM | POA: Diagnosis not present

## 2016-09-12 DIAGNOSIS — M25551 Pain in right hip: Secondary | ICD-10-CM | POA: Diagnosis not present

## 2016-09-12 DIAGNOSIS — I482 Chronic atrial fibrillation: Secondary | ICD-10-CM | POA: Diagnosis not present

## 2016-09-12 DIAGNOSIS — I110 Hypertensive heart disease with heart failure: Secondary | ICD-10-CM | POA: Diagnosis not present

## 2016-09-13 DIAGNOSIS — I5042 Chronic combined systolic (congestive) and diastolic (congestive) heart failure: Secondary | ICD-10-CM | POA: Diagnosis not present

## 2016-09-13 DIAGNOSIS — M25551 Pain in right hip: Secondary | ICD-10-CM | POA: Diagnosis not present

## 2016-09-13 DIAGNOSIS — I482 Chronic atrial fibrillation: Secondary | ICD-10-CM | POA: Diagnosis not present

## 2016-09-13 DIAGNOSIS — I110 Hypertensive heart disease with heart failure: Secondary | ICD-10-CM | POA: Diagnosis not present

## 2016-09-13 DIAGNOSIS — I69351 Hemiplegia and hemiparesis following cerebral infarction affecting right dominant side: Secondary | ICD-10-CM | POA: Diagnosis not present

## 2016-09-14 DIAGNOSIS — I69351 Hemiplegia and hemiparesis following cerebral infarction affecting right dominant side: Secondary | ICD-10-CM | POA: Diagnosis not present

## 2016-09-14 DIAGNOSIS — I482 Chronic atrial fibrillation: Secondary | ICD-10-CM | POA: Diagnosis not present

## 2016-09-14 DIAGNOSIS — I5042 Chronic combined systolic (congestive) and diastolic (congestive) heart failure: Secondary | ICD-10-CM | POA: Diagnosis not present

## 2016-09-14 DIAGNOSIS — M25551 Pain in right hip: Secondary | ICD-10-CM | POA: Diagnosis not present

## 2016-09-14 DIAGNOSIS — I110 Hypertensive heart disease with heart failure: Secondary | ICD-10-CM | POA: Diagnosis not present

## 2016-09-15 DIAGNOSIS — I482 Chronic atrial fibrillation: Secondary | ICD-10-CM | POA: Diagnosis not present

## 2016-09-15 DIAGNOSIS — I5042 Chronic combined systolic (congestive) and diastolic (congestive) heart failure: Secondary | ICD-10-CM | POA: Diagnosis not present

## 2016-09-15 DIAGNOSIS — I110 Hypertensive heart disease with heart failure: Secondary | ICD-10-CM | POA: Diagnosis not present

## 2016-09-15 DIAGNOSIS — I69351 Hemiplegia and hemiparesis following cerebral infarction affecting right dominant side: Secondary | ICD-10-CM | POA: Diagnosis not present

## 2016-09-15 DIAGNOSIS — M25551 Pain in right hip: Secondary | ICD-10-CM | POA: Diagnosis not present

## 2016-09-18 DIAGNOSIS — I6340 Cerebral infarction due to embolism of unspecified cerebral artery: Secondary | ICD-10-CM | POA: Diagnosis not present

## 2016-09-18 DIAGNOSIS — I482 Chronic atrial fibrillation: Secondary | ICD-10-CM | POA: Diagnosis not present

## 2016-09-18 DIAGNOSIS — I5042 Chronic combined systolic (congestive) and diastolic (congestive) heart failure: Secondary | ICD-10-CM | POA: Diagnosis not present

## 2016-09-18 DIAGNOSIS — M25551 Pain in right hip: Secondary | ICD-10-CM | POA: Diagnosis not present

## 2016-09-18 DIAGNOSIS — I110 Hypertensive heart disease with heart failure: Secondary | ICD-10-CM | POA: Diagnosis not present

## 2016-09-18 DIAGNOSIS — I69351 Hemiplegia and hemiparesis following cerebral infarction affecting right dominant side: Secondary | ICD-10-CM | POA: Diagnosis not present

## 2016-09-20 DIAGNOSIS — I110 Hypertensive heart disease with heart failure: Secondary | ICD-10-CM | POA: Diagnosis not present

## 2016-09-20 DIAGNOSIS — I482 Chronic atrial fibrillation: Secondary | ICD-10-CM | POA: Diagnosis not present

## 2016-09-20 DIAGNOSIS — I69351 Hemiplegia and hemiparesis following cerebral infarction affecting right dominant side: Secondary | ICD-10-CM | POA: Diagnosis not present

## 2016-09-20 DIAGNOSIS — I5042 Chronic combined systolic (congestive) and diastolic (congestive) heart failure: Secondary | ICD-10-CM | POA: Diagnosis not present

## 2016-09-20 DIAGNOSIS — M25551 Pain in right hip: Secondary | ICD-10-CM | POA: Diagnosis not present

## 2016-09-26 DIAGNOSIS — M5136 Other intervertebral disc degeneration, lumbar region: Secondary | ICD-10-CM | POA: Diagnosis not present

## 2016-09-27 DIAGNOSIS — I5042 Chronic combined systolic (congestive) and diastolic (congestive) heart failure: Secondary | ICD-10-CM | POA: Diagnosis not present

## 2016-09-27 DIAGNOSIS — I110 Hypertensive heart disease with heart failure: Secondary | ICD-10-CM | POA: Diagnosis not present

## 2016-09-27 DIAGNOSIS — M25551 Pain in right hip: Secondary | ICD-10-CM | POA: Diagnosis not present

## 2016-09-27 DIAGNOSIS — I482 Chronic atrial fibrillation: Secondary | ICD-10-CM | POA: Diagnosis not present

## 2016-09-27 DIAGNOSIS — I69351 Hemiplegia and hemiparesis following cerebral infarction affecting right dominant side: Secondary | ICD-10-CM | POA: Diagnosis not present

## 2016-09-29 ENCOUNTER — Telehealth: Payer: Self-pay | Admitting: Family Medicine

## 2016-09-29 DIAGNOSIS — I5042 Chronic combined systolic (congestive) and diastolic (congestive) heart failure: Secondary | ICD-10-CM | POA: Diagnosis not present

## 2016-09-29 DIAGNOSIS — I110 Hypertensive heart disease with heart failure: Secondary | ICD-10-CM | POA: Diagnosis not present

## 2016-09-29 DIAGNOSIS — M25551 Pain in right hip: Secondary | ICD-10-CM | POA: Diagnosis not present

## 2016-09-29 DIAGNOSIS — I482 Chronic atrial fibrillation: Secondary | ICD-10-CM | POA: Diagnosis not present

## 2016-09-29 DIAGNOSIS — I69351 Hemiplegia and hemiparesis following cerebral infarction affecting right dominant side: Secondary | ICD-10-CM | POA: Diagnosis not present

## 2016-09-29 NOTE — Telephone Encounter (Signed)
Please advise 

## 2016-09-29 NOTE — Telephone Encounter (Signed)
PT state that the pts body weight on Wednesday was 160 and 164 on today she need to know if she should change any of his medication or what would you like for her to do.

## 2016-10-01 NOTE — Telephone Encounter (Signed)
Confirm  His current dose of Lasix- there are 2 different doses listed- 20 mg and 40 mg

## 2016-10-02 DIAGNOSIS — I69351 Hemiplegia and hemiparesis following cerebral infarction affecting right dominant side: Secondary | ICD-10-CM | POA: Diagnosis not present

## 2016-10-02 DIAGNOSIS — I482 Chronic atrial fibrillation: Secondary | ICD-10-CM | POA: Diagnosis not present

## 2016-10-02 DIAGNOSIS — I110 Hypertensive heart disease with heart failure: Secondary | ICD-10-CM | POA: Diagnosis not present

## 2016-10-02 DIAGNOSIS — M25551 Pain in right hip: Secondary | ICD-10-CM | POA: Diagnosis not present

## 2016-10-02 DIAGNOSIS — I5042 Chronic combined systolic (congestive) and diastolic (congestive) heart failure: Secondary | ICD-10-CM | POA: Diagnosis not present

## 2016-10-02 NOTE — Telephone Encounter (Signed)
I called the pt and Urvi,PT and informed them of the message below.

## 2016-10-02 NOTE — Telephone Encounter (Signed)
Take 20 mg dose off chart.  Increase his AM lasix to 80 mg (take two 40 mg tabs) and continue with 40 mg in PM for 3 days then drop back to 40 mg bid

## 2016-10-02 NOTE — Telephone Encounter (Signed)
I called the pt and he stated he is taking Lasix 40mg  twice a day.  Message sent to Dr Elease Hashimoto.

## 2016-10-04 ENCOUNTER — Telehealth: Payer: Self-pay | Admitting: Family Medicine

## 2016-10-04 DIAGNOSIS — M25551 Pain in right hip: Secondary | ICD-10-CM | POA: Diagnosis not present

## 2016-10-04 DIAGNOSIS — I69351 Hemiplegia and hemiparesis following cerebral infarction affecting right dominant side: Secondary | ICD-10-CM | POA: Diagnosis not present

## 2016-10-04 DIAGNOSIS — I482 Chronic atrial fibrillation: Secondary | ICD-10-CM | POA: Diagnosis not present

## 2016-10-04 DIAGNOSIS — I5042 Chronic combined systolic (congestive) and diastolic (congestive) heart failure: Secondary | ICD-10-CM | POA: Diagnosis not present

## 2016-10-04 DIAGNOSIS — I110 Hypertensive heart disease with heart failure: Secondary | ICD-10-CM | POA: Diagnosis not present

## 2016-10-04 NOTE — Telephone Encounter (Signed)
fyi

## 2016-10-04 NOTE — Telephone Encounter (Signed)
Pt is being discharge from  home health PT today

## 2016-10-06 ENCOUNTER — Other Ambulatory Visit: Payer: Self-pay | Admitting: Family Medicine

## 2016-10-08 ENCOUNTER — Observation Stay (HOSPITAL_COMMUNITY)
Admission: EM | Admit: 2016-10-08 | Discharge: 2016-10-10 | Disposition: A | Discharge status: Home / Self Care | Payer: Commercial Managed Care - HMO | Attending: Family Medicine | Admitting: Family Medicine

## 2016-10-08 ENCOUNTER — Emergency Department (HOSPITAL_COMMUNITY): Payer: Commercial Managed Care - HMO

## 2016-10-08 ENCOUNTER — Encounter (HOSPITAL_COMMUNITY): Payer: Self-pay | Admitting: Family Medicine

## 2016-10-08 DIAGNOSIS — F329 Major depressive disorder, single episode, unspecified: Secondary | ICD-10-CM | POA: Insufficient documentation

## 2016-10-08 DIAGNOSIS — R0902 Hypoxemia: Secondary | ICD-10-CM

## 2016-10-08 DIAGNOSIS — S3210XA Unspecified fracture of sacrum, initial encounter for closed fracture: Secondary | ICD-10-CM | POA: Diagnosis not present

## 2016-10-08 DIAGNOSIS — I110 Hypertensive heart disease with heart failure: Secondary | ICD-10-CM | POA: Diagnosis not present

## 2016-10-08 DIAGNOSIS — W19XXXA Unspecified fall, initial encounter: Secondary | ICD-10-CM | POA: Insufficient documentation

## 2016-10-08 DIAGNOSIS — I5023 Acute on chronic systolic (congestive) heart failure: Secondary | ICD-10-CM

## 2016-10-08 DIAGNOSIS — Z8673 Personal history of transient ischemic attack (TIA), and cerebral infarction without residual deficits: Secondary | ICD-10-CM | POA: Diagnosis not present

## 2016-10-08 DIAGNOSIS — J9601 Acute respiratory failure with hypoxia: Secondary | ICD-10-CM

## 2016-10-08 DIAGNOSIS — N400 Enlarged prostate without lower urinary tract symptoms: Secondary | ICD-10-CM | POA: Insufficient documentation

## 2016-10-08 DIAGNOSIS — I481 Persistent atrial fibrillation: Secondary | ICD-10-CM | POA: Diagnosis not present

## 2016-10-08 DIAGNOSIS — I10 Essential (primary) hypertension: Secondary | ICD-10-CM

## 2016-10-08 DIAGNOSIS — I2510 Atherosclerotic heart disease of native coronary artery without angina pectoris: Secondary | ICD-10-CM

## 2016-10-08 DIAGNOSIS — K219 Gastro-esophageal reflux disease without esophagitis: Secondary | ICD-10-CM | POA: Insufficient documentation

## 2016-10-08 DIAGNOSIS — I509 Heart failure, unspecified: Secondary | ICD-10-CM

## 2016-10-08 DIAGNOSIS — Z8249 Family history of ischemic heart disease and other diseases of the circulatory system: Secondary | ICD-10-CM | POA: Diagnosis not present

## 2016-10-08 DIAGNOSIS — Z79899 Other long term (current) drug therapy: Secondary | ICD-10-CM | POA: Diagnosis not present

## 2016-10-08 DIAGNOSIS — N183 Chronic kidney disease, stage 3 unspecified: Secondary | ICD-10-CM | POA: Diagnosis present

## 2016-10-08 DIAGNOSIS — M1990 Unspecified osteoarthritis, unspecified site: Secondary | ICD-10-CM | POA: Diagnosis not present

## 2016-10-08 DIAGNOSIS — Z87891 Personal history of nicotine dependence: Secondary | ICD-10-CM | POA: Insufficient documentation

## 2016-10-08 DIAGNOSIS — Z7901 Long term (current) use of anticoagulants: Secondary | ICD-10-CM | POA: Diagnosis not present

## 2016-10-08 DIAGNOSIS — R0602 Shortness of breath: Secondary | ICD-10-CM | POA: Diagnosis not present

## 2016-10-08 DIAGNOSIS — I130 Hypertensive heart and chronic kidney disease with heart failure and stage 1 through stage 4 chronic kidney disease, or unspecified chronic kidney disease: Principal | ICD-10-CM | POA: Insufficient documentation

## 2016-10-08 DIAGNOSIS — I480 Paroxysmal atrial fibrillation: Secondary | ICD-10-CM

## 2016-10-08 DIAGNOSIS — S3992XA Unspecified injury of lower back, initial encounter: Secondary | ICD-10-CM | POA: Diagnosis not present

## 2016-10-08 DIAGNOSIS — F411 Generalized anxiety disorder: Secondary | ICD-10-CM | POA: Diagnosis not present

## 2016-10-08 LAB — CBC WITH DIFFERENTIAL/PLATELET
BASOS ABS: 0 10*3/uL (ref 0.0–0.1)
BASOS PCT: 0 %
EOS ABS: 0.2 10*3/uL (ref 0.0–0.7)
EOS PCT: 4 %
HCT: 35.8 % — ABNORMAL LOW (ref 39.0–52.0)
Hemoglobin: 12.2 g/dL — ABNORMAL LOW (ref 13.0–17.0)
Lymphocytes Relative: 14 %
Lymphs Abs: 0.8 10*3/uL (ref 0.7–4.0)
MCH: 28.8 pg (ref 26.0–34.0)
MCHC: 34.1 g/dL (ref 30.0–36.0)
MCV: 84.4 fL (ref 78.0–100.0)
MONO ABS: 0.3 10*3/uL (ref 0.1–1.0)
Monocytes Relative: 6 %
Neutro Abs: 4.1 10*3/uL (ref 1.7–7.7)
Neutrophils Relative %: 76 %
PLATELETS: 268 10*3/uL (ref 150–400)
RBC: 4.24 MIL/uL (ref 4.22–5.81)
RDW: 16 % — AB (ref 11.5–15.5)
WBC: 5.5 10*3/uL (ref 4.0–10.5)

## 2016-10-08 LAB — COMPREHENSIVE METABOLIC PANEL
ALBUMIN: 3.9 g/dL (ref 3.5–5.0)
ALT: 17 U/L (ref 17–63)
AST: 24 U/L (ref 15–41)
Alkaline Phosphatase: 54 U/L (ref 38–126)
Anion gap: 7 (ref 5–15)
BUN: 21 mg/dL — AB (ref 6–20)
CHLORIDE: 108 mmol/L (ref 101–111)
CO2: 23 mmol/L (ref 22–32)
CREATININE: 1.12 mg/dL (ref 0.61–1.24)
Calcium: 8.8 mg/dL — ABNORMAL LOW (ref 8.9–10.3)
GFR calc Af Amer: >60 mL/min (ref >60)
GFR calc non Af Amer: 54 mL/min — ABNORMAL LOW (ref >60)
GLUCOSE: 101 mg/dL — AB (ref 65–99)
Potassium: 4.3 mmol/L (ref 3.5–5.1)
SODIUM: 138 mmol/L (ref 135–145)
Total Bilirubin: 1.8 mg/dL — ABNORMAL HIGH (ref 0.3–1.2)
Total Protein: 6.9 g/dL (ref 6.5–8.1)

## 2016-10-08 LAB — URINALYSIS, ROUTINE W REFLEX MICROSCOPIC
BILIRUBIN URINE: NEGATIVE
Glucose, UA: NEGATIVE mg/dL
HGB URINE DIPSTICK: NEGATIVE
KETONES UR: NEGATIVE mg/dL
Leukocytes, UA: NEGATIVE
NITRITE: NEGATIVE
Protein, ur: NEGATIVE mg/dL
SPECIFIC GRAVITY, URINE: 1.014 (ref 1.005–1.030)
pH: 7 (ref 5.0–8.0)

## 2016-10-08 LAB — BRAIN NATRIURETIC PEPTIDE: B Natriuretic Peptide: 516.8 pg/mL — ABNORMAL HIGH (ref 0.0–100.0)

## 2016-10-08 LAB — TROPONIN I

## 2016-10-08 MED ORDER — HYDROCODONE-ACETAMINOPHEN 5-325 MG PO TABS
1.0000 | ORAL_TABLET | ORAL | Status: DC | PRN
  Administered 2016-10-09 – 2016-10-10 (×5): 1 via ORAL
  Filled 2016-10-08 (×5): qty 1

## 2016-10-08 MED ORDER — TAMSULOSIN HCL 0.4 MG PO CAPS
0.4000 mg | ORAL_CAPSULE | Freq: Every day | ORAL | Status: DC
  Administered 2016-10-09: 0.4 mg via ORAL
  Filled 2016-10-08 (×2): qty 1

## 2016-10-08 MED ORDER — FUROSEMIDE 10 MG/ML IJ SOLN
40.0000 mg | Freq: Once | INTRAMUSCULAR | Status: AC
  Administered 2016-10-08: 40 mg via INTRAVENOUS
  Filled 2016-10-08: qty 4

## 2016-10-08 MED ORDER — SODIUM CHLORIDE 0.9 % IV SOLN: 250.0000 mL | INTRAVENOUS | Status: DC | PRN

## 2016-10-08 MED ORDER — ATORVASTATIN CALCIUM 10 MG PO TABS
5.0000 mg | ORAL_TABLET | Freq: Every day | ORAL | Status: DC
  Administered 2016-10-09: 5 mg via ORAL
  Filled 2016-10-08: qty 1

## 2016-10-08 MED ORDER — PANTOPRAZOLE SODIUM 40 MG PO TBEC
40.0000 mg | DELAYED_RELEASE_TABLET | Freq: Every day | ORAL | Status: DC
  Administered 2016-10-09 – 2016-10-10 (×2): 40 mg via ORAL
  Filled 2016-10-08 (×2): qty 1

## 2016-10-08 MED ORDER — SERTRALINE HCL 50 MG PO TABS
150.0000 mg | ORAL_TABLET | Freq: Every day | ORAL | Status: DC
  Administered 2016-10-09 – 2016-10-10 (×2): 150 mg via ORAL
  Filled 2016-10-08 (×2): qty 3

## 2016-10-08 MED ORDER — DUTASTERIDE 0.5 MG PO CAPS
0.5000 mg | ORAL_CAPSULE | Freq: Every day | ORAL | Status: DC
  Administered 2016-10-09: 0.5 mg via ORAL
  Filled 2016-10-08 (×2): qty 1

## 2016-10-08 MED ORDER — SODIUM CHLORIDE 0.9% FLUSH
3.0000 mL | Freq: Two times a day (BID) | INTRAVENOUS | Status: DC
  Administered 2016-10-08 – 2016-10-09 (×2): 3 mL via INTRAVENOUS

## 2016-10-08 MED ORDER — ALPRAZOLAM 0.5 MG PO TABS: 0.5000 mg | ORAL_TABLET | Freq: Two times a day (BID) | ORAL | Status: DC | PRN

## 2016-10-08 MED ORDER — HYDROMORPHONE HCL 1 MG/ML IJ SOLN: 0.5000 mg | INTRAMUSCULAR | Status: DC | PRN

## 2016-10-08 MED ORDER — POTASSIUM CHLORIDE CRYS ER 10 MEQ PO TBCR
10.0000 meq | EXTENDED_RELEASE_TABLET | Freq: Every day | ORAL | Status: DC
  Administered 2016-10-09 – 2016-10-10 (×2): 10 meq via ORAL
  Filled 2016-10-08 (×2): qty 1

## 2016-10-08 MED ORDER — ACETAMINOPHEN 325 MG PO TABS
650.0000 mg | ORAL_TABLET | ORAL | Status: DC | PRN
  Filled 2016-10-08: qty 2

## 2016-10-08 MED ORDER — PRAMIPEXOLE DIHYDROCHLORIDE 0.125 MG PO TABS
0.1250 mg | ORAL_TABLET | Freq: Every day | ORAL | Status: DC
  Administered 2016-10-09 (×2): 0.125 mg via ORAL
  Filled 2016-10-08 (×2): qty 1

## 2016-10-08 MED ORDER — TRIAMCINOLONE 0.1 % CREAM:EUCERIN CREAM 1:1
1.0000 "application " | TOPICAL_CREAM | Freq: Two times a day (BID) | CUTANEOUS | Status: DC | PRN
  Administered 2016-10-09: 1 via TOPICAL
  Filled 2016-10-08 (×2): qty 1

## 2016-10-08 MED ORDER — HEPARIN SODIUM (PORCINE) 5000 UNIT/ML IJ SOLN: 5000.0000 [IU] | Freq: Three times a day (TID) | INTRAMUSCULAR | Status: DC

## 2016-10-08 MED ORDER — FUROSEMIDE 10 MG/ML IJ SOLN
40.0000 mg | Freq: Two times a day (BID) | INTRAMUSCULAR | Status: DC
  Administered 2016-10-09: 40 mg via INTRAVENOUS
  Filled 2016-10-08: qty 4

## 2016-10-08 MED ORDER — POLYETHYLENE GLYCOL 3350 17 G PO PACK: 17.0000 g | PACK | Freq: Every day | ORAL | Status: DC | PRN

## 2016-10-08 MED ORDER — CARVEDILOL 6.25 MG PO TABS
6.2500 mg | ORAL_TABLET | Freq: Two times a day (BID) | ORAL | Status: DC
  Administered 2016-10-09 (×2): 6.25 mg via ORAL
  Filled 2016-10-08 (×3): qty 1

## 2016-10-08 MED ORDER — GABAPENTIN 100 MG PO CAPS
200.0000 mg | ORAL_CAPSULE | Freq: Three times a day (TID) | ORAL | Status: DC
  Administered 2016-10-09 (×2): 200 mg via ORAL
  Filled 2016-10-08 (×2): qty 2

## 2016-10-08 MED ORDER — SODIUM CHLORIDE 0.9% FLUSH: 3.0000 mL | INTRAVENOUS | Status: DC | PRN

## 2016-10-08 MED ORDER — ONDANSETRON HCL 4 MG/2ML IJ SOLN: 4.0000 mg | Freq: Four times a day (QID) | INTRAMUSCULAR | Status: DC | PRN

## 2016-10-08 MED ORDER — SENNOSIDES-DOCUSATE SODIUM 8.6-50 MG PO TABS: 1.0000 | ORAL_TABLET | Freq: Every evening | ORAL | Status: DC | PRN

## 2016-10-08 MED ORDER — APIXABAN 2.5 MG PO TABS
2.5000 mg | ORAL_TABLET | Freq: Two times a day (BID) | ORAL | Status: DC
  Administered 2016-10-09 (×2): 2.5 mg via ORAL
  Filled 2016-10-08 (×2): qty 1

## 2016-10-08 MED ORDER — FLUTICASONE PROPIONATE 50 MCG/ACT NA SUSP
2.0000 | Freq: Two times a day (BID) | NASAL | Status: DC | PRN
  Filled 2016-10-08: qty 16

## 2016-10-08 NOTE — ED Provider Notes (Signed)
[facility] DEPT Provider Note   CSN: [id number] Arrival date & time: 10/08/16  2027     History   Chief Complaint Chief Complaint  Patient presents with  . Shortness of Breath  . Weakness    HPI Khiyon Moyse is a 82 y.o. male.  Pt presents to the ED with SOB.  The pt's son gives most of the history.  He fell last night which is not unusual for pt.  He seemed to be ok with the exception of coccyx pain.  The pt developed worsening SOB today, but has been sob for the past few days.  His doctor increased his lasix last week to help with sob.  He has not had fevers or chills.  The pt denies sob now.      Past Medical History:  Diagnosis Date  . Anxiety   . Arthritis   . BPH (benign prostatic hyperplasia)   . CHF (congestive heart failure) ([place])   . Chronic combined systolic and diastolic CHF, NYHA class 2 ([place]) 04/17/2015  . GERD (gastroesophageal reflux disease)   . Hypertension   . Stroke ([place])   . Symptomatic sinus bradycardia    PPM Dr. Rayann Heman, 01/13/16, STJ device    Patient Active Problem List   Diagnosis Date Noted  . CHF (congestive heart failure) ([place]) 10/08/2016  . Hypokalemia 08/24/2016  . Community acquired pneumonia 08/23/2016  . Pruritus 08/09/2016  . Chronic anticoagulation 04/28/2016  . Cardiac pacemaker in situ 04/28/2016  . Chronic systolic congestive heart failure ([place]) 04/28/2016  . Left hemiparesis ([place]) 04/25/2016  . Encounter for therapeutic drug monitoring 04/19/2016  . Cardiomyopathy, ischemic   . Cerebral embolism with cerebral infarction 03/03/2016  . Normocytic anemia   . Thrombocytopenia ([place])   . Acute left hemiparesis ([place])   . Peripheral neuropathic pain   . Hemiplegia ([place]) 03/02/2016  . Adenopathy   . Long term current use of anticoagulant therapy 01/23/2016  . CAD (coronary artery disease) 01/23/2016  . Second degree Mobitz II AV block 01/13/2016  . Chronic systolic CHF (congestive heart failure) ([place])   .  Persistent atrial fibrillation ([place])   . Atrial fibrillation with slow ventricular response ([place])   . AKI (acute kidney injury) ([place])   . Bradycardia 12/29/2015  . Acute renal failure (ARF) ([place]) 12/29/2015  . Hyponatremia 12/29/2015  . Hypotension 12/29/2015  . Elevated troponin 12/29/2015  . Acute upper respiratory infection 12/23/2015  . Chronic combined systolic and diastolic CHF, NYHA class 2 ([place]) 04/17/2015  . Vitamin D deficiency 03/11/2015  . Cardiomyopathy -etiology uncertain [id number]  . Paroxysmal atrial fibrillation ([place]) 08/27/2014  . 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 H/O: stroke 08/27/2014  . GERD (gastroesophageal reflux disease) 02/07/2011  . BPH (benign prostatic hypertrophy) 07/12/2010  . Anxiety state 11/02/2009  . Essential hypertension 11/02/2009    Past Surgical History:  Procedure Laterality Date  . CARDIAC CATHETERIZATION  2006   Negative / 4 years ago  . EP IMPLANTABLE DEVICE N/A 01/13/2016   Procedure: Pacemaker Implant;  Surgeon: Thompson Grayer, MD;  Location: [facility];  Service: Cardiovascular;  Laterality: N/A;       Home Medications    Prior to Admission medications   Medication Sig Start Date End Date Taking? Authorizing Provider  ALPRAZolam Duanne Moron) 0.5 MG tablet Take 1 tablet (0.5 mg total) by mouth 2 (two) times daily as needed for anxiety. 08/28/16   Velvet Bathe, MD  apixaban (ELIQUIS) 2.5 MG TABS tablet Take 1 tablet (2.5  mg total) by mouth 2 (two) times daily. 08/28/16   Velvet Bathe, MD  atorvastatin (LIPITOR) 10 MG tablet Take 0.5 tablets (5 mg total) by mouth daily at 6 PM. 08/28/16   Velvet Bathe, MD  carvedilol (COREG) 3.125 MG tablet Take 2 tablets (6.25 mg total) by mouth 2 (two) times daily with a meal. 06/09/16   Mihai Croitoru, MD  dutasteride (AVODART) 0.5 MG capsule Take 1 capsule (0.5 mg total) by mouth daily. 04/27/16   [name], MD  fluticasone (FLONASE) 50 MCG/ACT nasal spray Place 2 sprays into both nostrils 2  (two) times daily as needed for rhinitis.     Historical Provider, MD  furosemide (LASIX) 40 MG tablet Take 1 tablet (40 mg total) by mouth daily. 08/18/16 11/16/16  Mihai Croitoru, MD  gabapentin (NEURONTIN) 100 MG capsule Take 200 mg by mouth 3 (three) times daily.    Historical Provider, MD  KLOR-CON M10 10 MEQ tablet TAKE 1 TABLET BY MOUTH EVERY DAY 09/11/16   Mihai Croitoru, MD  lisinopril (PRINIVIL,ZESTRIL) 10 MG tablet Take 10 mg by mouth daily.    Historical Provider, MD  omeprazole (PRILOSEC) 20 MG capsule Take 20 mg by mouth daily.    Historical Provider, MD  polyethylene glycol (MIRALAX / GLYCOLAX) packet Take 17 g by mouth daily as needed for mild constipation.    Historical Provider, MD  pramipexole (MIRAPEX) 0.125 MG tablet Take 0.125 mg by mouth at bedtime.    Historical Provider, MD  pramipexole (MIRAPEX) 0.125 MG tablet TAKE 1 TABLET BY MOUTH AT BEDTIME 10/06/16   [name], MD  senna-docusate (SENOKOT-S) 8.6-50 MG tablet Take 1 tablet by mouth at bedtime as needed for mild constipation. 03/06/16   Nishant Dhungel, MD  sertraline (ZOLOFT) 100 MG tablet Take 150 mg by mouth daily.    Historical Provider, MD  tamsulosin (FLOMAX) 0.4 MG CAPS capsule Take 1 capsule (0.4 mg total) by mouth daily. 04/27/16   [name], MD  Triamcinolone Acetonide (TRIAMCINOLONE 0.1 % CREAM : EUCERIN) CREA Apply 1 application topically 2 (two) times daily as needed for rash, itching or irritation.    Historical Provider, MD    Family History Family History  Problem Relation Age of Onset  . Pneumonia Father 58    died  . Lung cancer Brother   . Heart disease Brother   . Pancreatic cancer Son   . Healthy Son   . Heart failure Son     Social History Social History  Substance Use Topics  . Smoking status: Former Smoker    Packs/day: 0.50    Years: 5.00  . Smokeless tobacco: Never Used  . Alcohol use 0.6 oz/week    1 Shots of liquor per week     Comment: occ     Allergies     Chocolate   Review of Systems Review of Systems  Respiratory: Positive for shortness of breath.   Musculoskeletal:       Coccyx pain  All other systems reviewed and are negative.    Physical Exam Updated Vital Signs BP 140/90   Pulse 66   Temp 98.3 F (36.8 C) (Rectal)   Resp 26   SpO2 97%   Physical Exam  Constitutional: He is oriented to person, place, and time. He appears well-developed and well-nourished.  HENT:  Head: Normocephalic and atraumatic.  Right Ear: External ear normal.  Left Ear: External ear normal.  Nose: Nose normal.  Mouth/Throat: Oropharynx is clear and moist.  Eyes: Conjunctivae and EOM are normal. Pupils are equal, round, and reactive to light.  Neck: Normal range of motion. Neck supple.  Cardiovascular: Normal rate, regular rhythm, normal heart sounds and intact distal pulses.   Pulmonary/Chest: Effort normal. He has rales.  Abdominal: Soft. Bowel sounds are normal.  Musculoskeletal: Normal range of motion. He exhibits edema.  Neurological: He is alert and oriented to person, place, and time.  Skin: Skin is warm.  Psychiatric: He has a normal mood and affect. His behavior is normal. Judgment and thought content normal.  Nursing note and vitals reviewed.    ED Treatments / Results  Labs (all labs ordered are listed, but only abnormal results are displayed) Labs Reviewed  CBC WITH DIFFERENTIAL/PLATELET - Abnormal; Notable for the following:       Result Value   Hemoglobin 12.2 (*)    HCT 35.8 (*)    RDW 16.0 (*)    All other components within normal limits  BRAIN NATRIURETIC PEPTIDE - Abnormal; Notable for the following:    B Natriuretic Peptide 516.8 (*)    All other components within normal limits  COMPREHENSIVE METABOLIC PANEL - Abnormal; Notable for the following:    Glucose, Bld 101 (*)    BUN 21 (*)    Calcium 8.8 (*)    Total Bilirubin 1.8 (*)    GFR calc non Af Amer 54 (*)    All other components within normal limits   TROPONIN I  URINALYSIS, ROUTINE W REFLEX MICROSCOPIC (NOT AT [facility])    EKG  EKG Interpretation  Date/Time:  Sunday October 08 2016 20:41:51 EDT Ventricular Rate:  61 PR Interval:    QRS Duration: 190 QT Interval:  509 QTC Calculation: 513 R Axis:   -77 Text Interpretation:  Ventricular-paced rhythm Confirmed by Gilford Raid MD, Quinlin Conant ([id number]) on 10/08/2016 9:42:36 PM       Radiology Dg Chest 2 View  Result Date: 10/08/2016 CLINICAL DATA:  Shortness of breath EXAM: CHEST  2 VIEW COMPARISON:  08/23/2016 FINDINGS: Chronic cardiopericardial enlargement. Single chamber pacer from the left to the right ventricle. Vascular pedicle widening, diffuse interstitial opacity and trace pleural effusions. IMPRESSION: CHF. Electronically Signed   By: Monte Fantasia M.D.   On: 10/08/2016 21:46   Dg Sacrum/coccyx  Result Date: 10/08/2016 CLINICAL DATA:  82 year old male with fall and trauma to the tailbone EXAM: SACRUM AND COCCYX - 2+ VIEW COMPARISON:  Radiograph dated 10/15/2013 FINDINGS: There is focal discontinuity of the distal sacral bone seen on the lateral projection which is age indeterminate. Although this may be chronic acute fracture is not excluded. Clinical correlation is recommended. No other definite acute fracture identified. The bones are osteopenic. There are degenerative changes of the visualized lower lumbar spine and SI joints. Soft tissues are grossly unremarkable. IMPRESSION: Age indeterminate fracture of the distal sacrum. Clinical correlation recommended. Electronically Signed   By: Anner Crete M.D.   On: 10/08/2016 22:38    Procedures Procedures (including critical care time)  Medications Ordered in ED Medications  furosemide (LASIX) injection 40 mg (40 mg Intravenous Given 10/08/16 2225)     Initial Impression / Assessment and Plan / ED Course  I have reviewed the triage vital signs and the nursing notes.  Pertinent labs & imaging results that were available  during my care of the patient were reviewed by me and considered in my medical decision making (see chart for details).  Clinical Course    Pt gets very sob with mvmt and o2  sat drops.  Pt d/w Dr. Myna Hidalgo ([facility]) who will admit pt.  Final Clinical Impressions(s) / ED Diagnoses   Final diagnoses:  Acute on chronic congestive heart failure, unspecified congestive heart failure type ([place])  Hypoxia  Closed fracture of sacrum, unspecified fracture morphology, initial encounter [facility])    New Prescriptions New Prescriptions   No medications on file     Isla Pence, MD 10/08/16 2255

## 2016-10-08 NOTE — ED Notes (Signed)
Per Son, pt has been having spells of weakness "lately" and onset of SOB starting at 1800 today. Pt had a stroke about a month ago.

## 2016-10-08 NOTE — H&P (Signed)
History and Physical    Micheal Nicholson [id number] DOB: October 03, 1920 DOA: 10/08/2016  PCP: [name], MD   Patient coming from: Home  Chief Complaint: SOB, fall with tailbone pain  HPI: Micheal Nicholson is a 82 y.o. male with medical history significant for hypertension, paroxysmal atrial fibrillation, chronic systolic CHF, depression, and anxiety who presents to the emergency department for evaluation of dyspnea and sacral pain following a fall at home. Patient reports insidious development of exertional dyspnea beginning about 2 weeks ago. This was felt to be secondary to CHF and his morning Lasix dose was doubled for 3 days starting on 09/29/2016. Patient reports improvement with the increase in Lasix, but his exertional dyspnea has been worsening again since he went back down to his regular dose. By yesterday, patient reports dyspnea while at rest and has noted development of bilateral leg swelling. Patient denies chest pain or palpitations and denies fevers or chills. There has been a dry cough. Yesterday evening, patient became very dyspneic while trying to get to the restroom and fell backwards, striking his tailbone on a portable heater. He has continued to bear weight and ambulate since the fall, but has severe pain at the sacrum, but is activity is mostly limited by dyspnea.  ED Course: Upon arrival to the ED, patient is found to be afebrile, saturating 89% on room air while at rest, and with vitals otherwise stable. EKG reveals a ventricular-paced rhythm. Chest x-ray features diffuse bilateral airspace opacities and trace bilateral effusions consistent with acute CHF. Chemistry panel features a serum creatinine 1.12 which is better than recent priors. CBC features a stable normocytic anemia with hemoglobin of 12.2. Troponin is undetectable and BNP is elevated to 517. Patient was treated with 40 mg IV Lasix and has begun to diurese. He continues to be in respiratory  distress and desaturates with any activity. He'll be observed on the telemetry unit for ongoing evaluation and management of acute on chronic systolic CHF and acute sacral fracture.  Review of Systems:  All other systems reviewed and apart from HPI, are negative.  Past Medical History:  Diagnosis Date  . Anxiety   . Arthritis   . BPH (benign prostatic hyperplasia)   . CHF (congestive heart failure) ([place])   . Chronic combined systolic and diastolic CHF, NYHA class 2 ([place]) 04/17/2015  . GERD (gastroesophageal reflux disease)   . Hypertension   . Stroke ([place])   . Symptomatic sinus bradycardia    PPM Dr. Rayann Heman, 01/13/16, STJ device    Past Surgical History:  Procedure Laterality Date  . CARDIAC CATHETERIZATION  2006   Negative / 4 years ago  . EP IMPLANTABLE DEVICE N/A 01/13/2016   Procedure: Pacemaker Implant;  Surgeon: Thompson Grayer, MD;  Location: [facility];  Service: Cardiovascular;  Laterality: N/A;     reports that he has quit smoking. He has a 2.50 pack-year smoking history. He has never used smokeless tobacco. He reports that he drinks about 0.6 oz of alcohol per week . He reports that he does not use drugs.  Allergies  Allergen Reactions  . Chocolate Other (See Comments)    Reaction:  Acid reflux     Family History  Problem Relation Age of Onset  . Pneumonia Father 78    died  . Lung cancer Brother   . Heart disease Brother   . Pancreatic cancer Son   . Healthy Son   . Heart failure Son      Prior  to Admission medications   Medication Sig Start Date End Date Taking? Authorizing Provider  ALPRAZolam Duanne Moron) 0.5 MG tablet Take 1 tablet (0.5 mg total) by mouth 2 (two) times daily as needed for anxiety. 08/28/16   Velvet Bathe, MD  apixaban (ELIQUIS) 2.5 MG TABS tablet Take 1 tablet (2.5 mg total) by mouth 2 (two) times daily. 08/28/16   Velvet Bathe, MD  atorvastatin (LIPITOR) 10 MG tablet Take 0.5 tablets (5 mg total) by mouth daily at 6 PM. 08/28/16   Velvet Bathe, MD  carvedilol (COREG) 3.125 MG tablet Take 2 tablets (6.25 mg total) by mouth 2 (two) times daily with a meal. 06/09/16   Mihai Croitoru, MD  dutasteride (AVODART) 0.5 MG capsule Take 1 capsule (0.5 mg total) by mouth daily. 04/27/16   [name], MD  fluticasone (FLONASE) 50 MCG/ACT nasal spray Place 2 sprays into both nostrils 2 (two) times daily as needed for rhinitis.     Historical Provider, MD  furosemide (LASIX) 40 MG tablet Take 1 tablet (40 mg total) by mouth daily. 08/18/16 11/16/16  Mihai Croitoru, MD  gabapentin (NEURONTIN) 100 MG capsule Take 200 mg by mouth 3 (three) times daily.    Historical Provider, MD  KLOR-CON M10 10 MEQ tablet TAKE 1 TABLET BY MOUTH EVERY DAY 09/11/16   Mihai Croitoru, MD  lisinopril (PRINIVIL,ZESTRIL) 10 MG tablet Take 10 mg by mouth daily.    Historical Provider, MD  omeprazole (PRILOSEC) 20 MG capsule Take 20 mg by mouth daily.    Historical Provider, MD  polyethylene glycol (MIRALAX / GLYCOLAX) packet Take 17 g by mouth daily as needed for mild constipation.    Historical Provider, MD  pramipexole (MIRAPEX) 0.125 MG tablet Take 0.125 mg by mouth at bedtime.    Historical Provider, MD  pramipexole (MIRAPEX) 0.125 MG tablet TAKE 1 TABLET BY MOUTH AT BEDTIME 10/06/16   [name], MD  senna-docusate (SENOKOT-S) 8.6-50 MG tablet Take 1 tablet by mouth at bedtime as needed for mild constipation. 03/06/16   Nishant Dhungel, MD  sertraline (ZOLOFT) 100 MG tablet Take 150 mg by mouth daily.    Historical Provider, MD  tamsulosin (FLOMAX) 0.4 MG CAPS capsule Take 1 capsule (0.4 mg total) by mouth daily. 04/27/16   [name], MD  Triamcinolone Acetonide (TRIAMCINOLONE 0.1 % CREAM : EUCERIN) CREA Apply 1 application topically 2 (two) times daily as needed for rash, itching or irritation.    Historical Provider, MD    Physical Exam: Vitals:   10/08/16 2101 10/08/16 2130 10/08/16 2230 10/08/16 2300  BP: 142/86 141/85 140/90 127/79  Pulse: 64 60  66 (!) 59  Resp: 12 13 26 20   Temp:      TempSrc:      SpO2: (!) 89% 96% 97% 94%      Constitutional: Increased work of breathing, no pallor or cyanosis, no diaphoresis  Eyes: No scleral icterus, lids and conjunctivae normal ENMT: Mucous membranes are moist. Posterior pharynx clear of any exudate or lesions.   Neck: normal, supple, no masses, no thyromegaly Respiratory: Crackles at both bases and mid-lung zones; accessory muscle recruitment noted, nasal canula in place.  Cardiovascular: S1 & S2 heard, regular rate and rhythm. 2+ pretibial edema b/l. No carotid bruits.   Abdomen: No distension, no tenderness, no masses palpated. Bowel sounds normal.  Musculoskeletal: no clubbing / cyanosis. No joint deformity upper and lower extremities. Normal muscle tone.  Skin: no significant rashes, lesions, ulcers. Warm, dry, well-perfused. Neurologic:  CN 2-12 grossly intact. Sensation intact, DTR normal. PERRL, EOMI.  Psychiatric: Normal judgment and insight. Alert and oriented x 3. Normal mood and affect.     Labs on Admission: I have personally reviewed following labs and imaging studies  CBC:  Recent Labs Lab 10/08/16 2058  WBC 5.5  NEUTROABS 4.1  HGB 12.2*  HCT 35.8*  MCV 84.4  PLT [id number]   Basic Metabolic Panel:  Recent Labs Lab 10/08/16 2133  NA 138  K 4.3  CL 108  CO2 23  GLUCOSE 101*  BUN 21*  CREATININE 1.12  CALCIUM 8.8*   GFR: CrCl cannot be calculated (Unknown ideal weight.). Liver Function Tests:  Recent Labs Lab 10/08/16 2133  AST 24  ALT 17  ALKPHOS 54  BILITOT 1.8*  PROT 6.9  ALBUMIN 3.9   No results for input(s): LIPASE, AMYLASE in the last 168 hours. No results for input(s): AMMONIA in the last 168 hours. Coagulation Profile: No results for input(s): INR, PROTIME in the last 168 hours. Cardiac Enzymes:  Recent Labs Lab 10/08/16 2058  TROPONINI <0.03   BNP (last 3 results) No results for input(s): PROBNP in the last 8760  hours. HbA1C: No results for input(s): HGBA1C in the last 72 hours. CBG: No results for input(s): GLUCAP in the last 168 hours. Lipid Profile: No results for input(s): CHOL, HDL, LDLCALC, TRIG, CHOLHDL, LDLDIRECT in the last 72 hours. Thyroid Function Tests: No results for input(s): TSH, T4TOTAL, FREET4, T3FREE, THYROIDAB in the last 72 hours. Anemia Panel: No results for input(s): VITAMINB12, FOLATE, FERRITIN, TIBC, IRON, RETICCTPCT in the last 72 hours. Urine analysis:    Component Value Date/Time   COLORURINE YELLOW 05/05/2016 [place] 05/05/2016 1754   LABSPEC 1.015 05/05/2016 1754   PHURINE 6.0 05/05/2016 1754   GLUCOSEU NEGATIVE 05/05/2016 1754   HGBUR NEGATIVE 05/05/2016 1754   BILIRUBINUR NEGATIVE 05/05/2016 1754   BILIRUBINUR negative 11/10/2013 1713   KETONESUR NEGATIVE 05/05/2016 1754   PROTEINUR NEGATIVE 05/05/2016 1754   UROBILINOGEN 1.0 03/14/2015 1608   NITRITE NEGATIVE 05/05/2016 1754   LEUKOCYTESUR NEGATIVE 05/05/2016 1754   Sepsis Labs: @LABRCNTIP (procalcitonin:4,lacticidven:4) )No results found for this or any previous visit (from the past 240 hour(s)).   Radiological Exams on Admission: Dg Chest 2 View  Result Date: 10/08/2016 CLINICAL DATA:  Shortness of breath EXAM: CHEST  2 VIEW COMPARISON:  08/23/2016 FINDINGS: Chronic cardiopericardial enlargement. Single chamber pacer from the left to the right ventricle. Vascular pedicle widening, diffuse interstitial opacity and trace pleural effusions. IMPRESSION: CHF. Electronically Signed   By: Monte Fantasia M.D.   On: 10/08/2016 21:46   Dg Sacrum/coccyx  Result Date: 10/08/2016 CLINICAL DATA:  82 year old male with fall and trauma to the tailbone EXAM: SACRUM AND COCCYX - 2+ VIEW COMPARISON:  Radiograph dated 10/15/2013 FINDINGS: There is focal discontinuity of the distal sacral bone seen on the lateral projection which is age indeterminate. Although this may be chronic acute fracture is not  excluded. Clinical correlation is recommended. No other definite acute fracture identified. The bones are osteopenic. There are degenerative changes of the visualized lower lumbar spine and SI joints. Soft tissues are grossly unremarkable. IMPRESSION: Age indeterminate fracture of the distal sacrum. Clinical correlation recommended. Electronically Signed   By: Anner Crete M.D.   On: 10/08/2016 22:38    EKG: Independently reviewed. V-paced rhythm  Assessment/Plan  1. Acute on chronic systolic CHF with acute hypoxic respiratory failure - Has progressive dyspnea and leg swelling with crackles on  exam, elevated BNP, and pulmonary edema on CXR  - "Dry weight" appears to be ~160 lbs; admission wt pending  - TTE (08/23/16) with EF 35-40%, mild-moderate AR, TR, and MR; mild LAE; moderate increased PA pressures - Managed at home with Lasix 40 mg BID, Coreg 6.25 BID, and lisinopril 10 qD  - Given 40 mg IV Lasix in ED; will continue diuresis with Lasix 40 mg IV q12h, adjusted based on UOP, renal fxn, clinical response  - Continue Coreg; lisinopril held on admission while diuresing to avoid precipitating AKI  - SLIV, follow strict I/O's and daily wts, fluid-restrict diet    2. Fall with distal sacral fracture  - Pt suffered mechanical fall the night of 10/14, striking tailbone on a ceramic portable heater  - Radiographs suggest fracture  - PT eval requested; pain-control with prn's   3. Paroxysmal atrial fibrillation  - CHADS-VASc at least 7 (age x2, CVA x2, CAD, HTN, CHF) - Continue AC with Eliquis  - Rate is well-controlled on Coreg, will continue    4. CAD - No anginal complaints on admission and troponin undetectable  - Continue Lipitor, ASA 81, Coreg  5. CKD stage III  - SCr 1.12 on admission, best in a long time  - Follow daily BMP while diuresing; lisinopril held initially while diuresing, can be resumed if renal fxn remains stable   6. Hypertension  - At goal currently  - Continue  Coreg 6.25 mg BID   7. Depression, anxiety  - Stable  - Continue Zoloft and prn Xanax    DVT prophylaxis: Eliquis Code Status: Full  Family Communication: Son updated at bedside at patient's request  Disposition Plan: Observe on telemetry Consults called: None Admission status: Observation    Vianne Bulls, MD [facility] Pager [phone]  If 7PM-7AM, please contact night-coverage [email or web address] Password [email or web address]  10/08/2016, 11:20 PM

## 2016-10-09 DIAGNOSIS — I5023 Acute on chronic systolic (congestive) heart failure: Secondary | ICD-10-CM | POA: Diagnosis not present

## 2016-10-09 DIAGNOSIS — F411 Generalized anxiety disorder: Secondary | ICD-10-CM | POA: Diagnosis not present

## 2016-10-09 DIAGNOSIS — J9601 Acute respiratory failure with hypoxia: Secondary | ICD-10-CM | POA: Diagnosis not present

## 2016-10-09 DIAGNOSIS — S3210XD Unspecified fracture of sacrum, subsequent encounter for fracture with routine healing: Secondary | ICD-10-CM

## 2016-10-09 DIAGNOSIS — I2510 Atherosclerotic heart disease of native coronary artery without angina pectoris: Secondary | ICD-10-CM | POA: Diagnosis not present

## 2016-10-09 DIAGNOSIS — N183 Chronic kidney disease, stage 3 (moderate): Secondary | ICD-10-CM | POA: Diagnosis not present

## 2016-10-09 LAB — BASIC METABOLIC PANEL
ANION GAP: 6 (ref 5–15)
BUN: 20 mg/dL (ref 6–20)
CHLORIDE: 108 mmol/L (ref 101–111)
CO2: 26 mmol/L (ref 22–32)
Calcium: 8.8 mg/dL — ABNORMAL LOW (ref 8.9–10.3)
Creatinine, Ser: 1.3 mg/dL — ABNORMAL HIGH (ref 0.61–1.24)
GFR calc non Af Amer: 45 mL/min — ABNORMAL LOW (ref >60)
GFR, EST AFRICAN AMERICAN: 52 mL/min — AB (ref >60)
Glucose, Bld: 96 mg/dL (ref 65–99)
Potassium: 3.8 mmol/L (ref 3.5–5.1)
Sodium: 140 mmol/L (ref 135–145)

## 2016-10-09 MED ORDER — ORAL CARE MOUTH RINSE: 15.0000 mL | Freq: Two times a day (BID) | OROMUCOSAL | Status: DC

## 2016-10-09 MED ORDER — FUROSEMIDE 40 MG PO TABS
40.0000 mg | ORAL_TABLET | Freq: Every day | ORAL | Status: DC
  Administered 2016-10-10: 40 mg via ORAL
  Filled 2016-10-09: qty 1

## 2016-10-09 MED ORDER — APIXABAN 5 MG PO TABS
5.0000 mg | ORAL_TABLET | Freq: Two times a day (BID) | ORAL | Status: DC
  Administered 2016-10-09 – 2016-10-10 (×2): 5 mg via ORAL
  Filled 2016-10-09 (×2): qty 1

## 2016-10-09 MED ORDER — LISINOPRIL 10 MG PO TABS
5.0000 mg | ORAL_TABLET | Freq: Every day | ORAL | Status: DC
  Administered 2016-10-09 – 2016-10-10 (×2): 5 mg via ORAL
  Filled 2016-10-09 (×2): qty 1

## 2016-10-09 MED ORDER — INFLUENZA VAC SPLIT QUAD 0.5 ML IM SUSY
0.5000 mL | PREFILLED_SYRINGE | INTRAMUSCULAR | Status: AC
  Administered 2016-10-10: 0.5 mL via INTRAMUSCULAR
  Filled 2016-10-09: qty 0.5

## 2016-10-09 MED ORDER — APIXABAN 5 MG PO TABS: 5.0000 mg | ORAL_TABLET | Freq: Two times a day (BID) | ORAL | Status: DC

## 2016-10-09 MED ORDER — GABAPENTIN 100 MG PO CAPS
200.0000 mg | ORAL_CAPSULE | Freq: Two times a day (BID) | ORAL | Status: DC
  Administered 2016-10-09 – 2016-10-10 (×2): 200 mg via ORAL
  Filled 2016-10-09 (×2): qty 2

## 2016-10-09 MED ORDER — APIXABAN 2.5 MG PO TABS
2.5000 mg | ORAL_TABLET | Freq: Once | ORAL | Status: AC
  Administered 2016-10-09: 2.5 mg via ORAL
  Filled 2016-10-09: qty 1

## 2016-10-09 NOTE — Care Management Note (Signed)
Case Management Note  Patient Details  Name: Micheal Nicholson MRN: [id number] Date of Birth: 1920/03/19  Subjective/Objective:  82 y/o m admitted w/Acute on chroinc CHF. From home w/son, has rw,3n1. Patient has gone to SNF in past he declines to got to SNF again states "I can do what they did for me @ home" per patient permission TC son Gene tel#[phone]-he also also agrees to patient declining SNF if recommended-CSW aware.[facility] chosen for [facility] aware-awaiting [place] orders, f30f.                 Action/Plan:d/c plan home w/HHC.   Expected Discharge Date:                  Expected Discharge Plan:     In-House Referral:  Clinical Social Work  Discharge planning Services  CM Consult  Post Acute Care Choice:  Durable Medical Equipment (4 wheeled rw, 3n1) Choice offered to:  Patient  DME Arranged:    DME Agency:     HH Arranged:    Green Agency:  [facility]  Status of Service:  In process, will continue to follow  If discussed at Long Length of Stay Meetings, dates discussed:    Additional Comments:  Dessa Phi, RN 10/09/2016, 12:10 PM

## 2016-10-09 NOTE — Progress Notes (Signed)
PROGRESS NOTE    Micheal Nicholson  [id number]  DOB: 05-Aug-1920  DOA: 10/08/2016 PCP: [name], MD Outpatient Specialists:   Hospital course:  Micheal Nicholson is a 82 y.o. male with medical history significant for hypertension, paroxysmal atrial fibrillation, chronic systolic CHF, depression, and anxiety who presents to the emergency department for evaluation of dyspnea and sacral pain following a fall at home.  Assessment & Plan:   1. Acute on chronic systolic CHF with acute hypoxic respiratory failure - Has progressive dyspnea and leg swelling with crackles on exam, elevated BNP, and pulmonary edema on CXR  - "Dry weight" appears to be ~160 lbs; admission wt pending  - TTE (08/23/16) with EF 35-40%, mild-moderate AR, TR, and MR; mild LAE; moderate increased PA pressures - Managed at home wiith Lasix 40 mg QD, Coreg 6.25 BID, and lisinopril 10 qD  - Given 40 mg IV Lasix in ED; will continue diuresis with Lasix 40 mg IV q12h, adjusted based on UOP, renal fxn, clinical response  - Continue Coreg; lisinopril held on admission while diuresing to avoid precipitating AKI  - SLIV, follow strict I/O's and daily wts, fluid-restrict    2. Fall with distal sacral fracture  - Pt suffered mechanical fall the night of 10/14, striking tailbone on a ceramic portable heater  - Radiographs suggest fracture  - PT eval requested; pain-control with prn's  patient already declining skilled nursing rehabilitation.  3. Paroxysmal atrial fibrillation  - CHADS-VASc at least 7 (age x2, CVA x2, CAD, HTN, CHF) - Continue AC with Eliquis  - Rate is well-controlled on Coreg, will continue    4. CAD - No anginal complaints on admission and troponin undetectable  - Continue Lipitor, ASA 81, Coreg  5. CKD stage III  - SCr 1.12 on admission, best in a long time  - Follow daily BMP while diuresing; lisinopril held initially while diuresing, can be resumed if renal fxn remains stable     6. Hypertension  - At goal currently  - Continue Coreg 6.25 mg BID   7. Depression, anxiety  - Stable  - Continue Zoloft and prn Xanax   DVT prophylaxis: Eliquis Code Status: Full  Family Communication: Son updated at patient's request  Disposition Plan: discharge 10/17 Consults called: None Admission status: Observation   Subjective: The patient reports that he is feeling better after diuresing so much fluid. He reports that he has been urinating frequently since getting Lasix.  Objective: Vitals:   10/08/16 2300 10/09/16 0013 10/09/16 0516 10/09/16 0645  BP: 127/79 140/79 131/80   Pulse: (!) 59 61 (!) 58   Resp: 20 20 18    Temp:  97.7 F (36.5 C) 97.7 F (36.5 C)   TempSrc:  Axillary Oral   SpO2: 94% 98% 97%   Weight:  75.7 kg (166 lb 14.2 oz)  74.2 kg (163 lb 9.6 oz)  Height:  5\' 10"  (1.778 m)      Intake/Output Summary (Last 24 hours) at 10/09/16 0823 Last data filed at 10/09/16 0600  Gross per 24 hour  Intake               60 ml  Output             1725 ml  Net            -1665 ml   Filed Weights   10/09/16 0013 10/09/16 0645  Weight: 75.7 kg (166 lb 14.2 oz) 74.2 kg (163 lb 9.6 oz)  Exam:  General exam: awake, alert, no apparent distress, cooperative. Respiratory system: Rare crackles heard. No increased work of breathing. Cardiovascular system: S1 & S2 heard. Gastrointestinal system: Abdomen is nondistended, soft and nontender. Normal bowel sounds heard. Central nervous system: Alert and oriented. No focal neurological deficits. Extremities: 1+ pedal edema bilateral lower extremities.  Data Reviewed: Basic Metabolic Panel:  Recent Labs Lab 10/08/16 2133 10/09/16 0501  NA 138 140  K 4.3 3.8  CL 108 108  CO2 23 26  GLUCOSE 101* 96  BUN 21* 20  CREATININE 1.12 1.30*  CALCIUM 8.8* 8.8*   Liver Function Tests:  Recent Labs Lab 10/08/16 2133  AST 24  ALT 17  ALKPHOS 54  BILITOT 1.8*  PROT 6.9  ALBUMIN 3.9   No results for  input(s): LIPASE, AMYLASE in the last 168 hours. No results for input(s): AMMONIA in the last 168 hours. CBC:  Recent Labs Lab 10/08/16 2058  WBC 5.5  NEUTROABS 4.1  HGB 12.2*  HCT 35.8*  MCV 84.4  PLT 268   Cardiac Enzymes:  Recent Labs Lab 10/08/16 2058  TROPONINI <0.03   CBG (last 3)  No results for input(s): GLUCAP in the last 72 hours. No results found for this or any previous visit (from the past 240 hour(s)).   Studies: Dg Chest 2 View  Result Date: 10/08/2016 CLINICAL DATA:  Shortness of breath EXAM: CHEST  2 VIEW COMPARISON:  08/23/2016 FINDINGS: Chronic cardiopericardial enlargement. Single chamber pacer from the left to the right ventricle. Vascular pedicle widening, diffuse interstitial opacity and trace pleural effusions. IMPRESSION: CHF. Electronically Signed   By: Monte Fantasia M.D.   On: 10/08/2016 21:46   Dg Sacrum/coccyx  Result Date: 10/08/2016 CLINICAL DATA:  82 year old male with fall and trauma to the tailbone EXAM: SACRUM AND COCCYX - 2+ VIEW COMPARISON:  Radiograph dated 10/15/2013 FINDINGS: There is focal discontinuity of the distal sacral bone seen on the lateral projection which is age indeterminate. Although this may be chronic acute fracture is not excluded. Clinical correlation is recommended. No other definite acute fracture identified. The bones are osteopenic. There are degenerative changes of the visualized lower lumbar spine and SI joints. Soft tissues are grossly unremarkable. IMPRESSION: Age indeterminate fracture of the distal sacrum. Clinical correlation recommended. Electronically Signed   By: Anner Crete M.D.   On: 10/08/2016 22:38    Scheduled Meds: . apixaban  2.5 mg Oral BID  . atorvastatin  5 mg Oral q1800  . carvedilol  6.25 mg Oral BID WC  . dutasteride  0.5 mg Oral Daily  . furosemide  40 mg Intravenous Q12H  . gabapentin  200 mg Oral TID  . [START ON 10/10/2016] Influenza vac split quadrivalent PF  0.5 mL  Intramuscular Tomorrow-1000  . mouth rinse  15 mL Mouth Rinse BID  . pantoprazole  40 mg Oral Daily  . potassium chloride  10 mEq Oral Daily  . pramipexole  0.125 mg Oral QHS  . sertraline  150 mg Oral Daily  . sodium chloride flush  3 mL Intravenous Q12H  . tamsulosin  0.4 mg Oral Daily   Continuous Infusions:   Principal Problem:   Acute on chronic systolic CHF (congestive heart failure) ([facility]) Active Problems:   Anxiety state   Essential hypertension   Paroxysmal atrial fibrillation ([facility])   CAD (coronary artery disease)   Acute respiratory failure with hypoxia ([facility])   CKD (chronic kidney disease), stage III   Sacral fracture, closed ([place])  Time  spent:   Irwin Brakeman, MD, FAAFP [facility] Pager [phone]  If 7PM-7AM, please contact night-coverage [email or web address] Password [email or web address] 10/09/2016, 8:23 AM    LOS: 0 days

## 2016-10-09 NOTE — Evaluation (Signed)
Physical Therapy Evaluation Patient Details Name: Micheal Nicholson MRN: [id number] DOB: [birth year] Today's Date: 10/09/2016   History of Present Illness  82 y.o. male with a past medical history significant for Afib on Eliquis, bradycardia with pacer, chronic systolic and diastolic CHF EF [id number], HTN, hx CVA with residual right sided weakness and cognitive deficits admitted for acute on chronic systolic CHF.  Clinical Impression  Pt admitted with above. Pt currently with functional limitations due to the deficits listed below (see PT Problem List). Pt will benefit from skilled PT to increase their independence and safety with mobility to allow discharge. Pt reported several falls at home and difficulty ambulating backwards with his rollator. Pt remained between 93-96% on room air throughout evaluation. Nursing was informed that pt was no longer on 1L O2. Pt declined SNF but was agreeable to HHPT to address balance, endurance, and strength deficits.     Follow Up Recommendations Home health PT;Supervision for mobility/OOB    Equipment Recommendations  Rolling walker with 5" wheels    Recommendations for Other Services       Precautions / Restrictions Precautions Precautions: Fall Precaution Comments: R sided weakness; impulsive Restrictions Weight Bearing Restrictions: No      Mobility  Bed Mobility Overal bed mobility: Needs Assistance Bed Mobility: Supine to Sit;Sit to Supine     Supine to sit: Min guard;HOB elevated     General bed mobility comments: guarding for safety; verbal cues for use of UEs to assist trunk to upright position and for LE positioning on EOB  Transfers Overall transfer level: Needs assistance Equipment used: Rolling walker (2 wheeled) Transfers: Sit to/from Stand Sit to Stand: Min assist         General transfer comment: assistance for rise and controlled descent; verbal cues for UE and RW positioning and to maintain upright posture once  standing  Ambulation/Gait Ambulation/Gait assistance: Min assist Ambulation Distance (Feet): 120 Feet Assistive device: Rolling walker (2 wheeled) Gait Pattern/deviations: Step-through pattern;Decreased stride length;Trunk flexed;Drifts right/left (drifting toward L)     General Gait Details: assistance for steadying, safety, and correction of left-sided drift (secondary to prior CVA resulting in R sided weakness); verbal cues to focus on RW positioning, upright posture, and correction of left-sided drift  Stairs            Wheelchair Mobility    Modified Rankin (Stroke Patients Only)       Balance Overall balance assessment: History of Falls;Needs assistance         Standing balance support: Bilateral upper extremity supported;During functional activity Standing balance-Leahy Scale: Poor Standing balance comment: required RW for UE support during transfers and gait; pt reported unsteadiness during gait and drifted toward L; pt reported several falls, especially when ambulating backwards (found this to be true during visit also)                             Pertinent Vitals/Pain Pain Assessment: 0-10 Pain Score:  (pt did not rate) Pain Location: R hip and sacrum Pain Descriptors / Indicators: Discomfort;Sore;Aching Pain Intervention(s): Limited activity within patient's tolerance;Monitored during session;Repositioned    Home Living Family/patient expects to be discharged to:: Private residence Living Arrangements: Children (son) Available Help at Discharge: Family;Available PRN/intermittently (pt reports son is available most hours of the day) Type of Home: House Home Access: Stairs to enter;Ramped entrance   Entrance Stairs-Number of Steps: 2 Home Layout: One level Home Equipment: Walker - 4  wheels;Cane - single point Additional Comments: Pt states he lives with son who is limited in ability to assist 2* cardiac issues    Prior Function Level of  Independence: Independent with assistive device(s)         Comments: rollator     Hand Dominance        Extremity/Trunk Assessment               Lower Extremity Assessment: Generalized weakness;RLE deficits/detail RLE Deficits / Details: 2+ throughout on R secondary to prior CVA    Cervical / Trunk Assessment: Kyphotic  Communication   Communication: HOH  Cognition Arousal/Alertness: Awake/alert Behavior During Therapy: WFL for tasks assessed/performed;Impulsive (slightly impulsive) Overall Cognitive Status: History of cognitive impairments - at baseline                      General Comments      Exercises     Assessment/Plan    PT Assessment Patient needs continued PT services  PT Problem List Decreased strength;Decreased balance;Decreased mobility;Decreased knowledge of use of DME;Decreased safety awareness          PT Treatment Interventions DME instruction;Gait training;Functional mobility training;Therapeutic activities;Therapeutic exercise;Balance training;Patient/family education;Stair training    PT Goals (Current goals can be found in the Care Plan section)  Acute Rehab PT Goals PT Goal Formulation: With patient Time For Goal Achievement: 10/16/16 Potential to Achieve Goals: Fair (due to lack of safety awareness and cognition deficits)    Frequency Min 3X/week   Barriers to discharge        Co-evaluation               End of Session Equipment Utilized During Treatment: Gait belt Activity Tolerance: Patient tolerated treatment well Patient left: in chair;with call bell/phone within reach;with chair alarm set      Functional Assessment Tool Used: clinical judgement Functional Limitation: Mobility: Walking and moving around Mobility: Walking and Moving Around Current Status [phone]): At least 20 percent but less than 40 percent impaired, limited or restricted Mobility: Walking and Moving Around Goal Status [phone]): At least  1 percent but less than 20 percent impaired, limited or restricted    Time: [id number] PT Time Calculation (min) (ACUTE ONLY): 20 min   Charges:   PT Evaluation $PT Eval Low Complexity: 1 Procedure     PT G Codes:   PT G-Codes **NOT FOR INPATIENT CLASS** Functional Assessment Tool Used: clinical judgement Functional Limitation: Mobility: Walking and moving around Mobility: Walking and Moving Around Current Status [id number]): At least 20 percent but less than 40 percent impaired, limited or restricted Mobility: Walking and Moving Around Goal Status [phone]): At least 1 percent but less than 20 percent impaired, limited or restricted    Dewitt Hoes 10/09/2016, 4:36 PM Dewitt Hoes, SPT

## 2016-10-09 NOTE — NC FL2 (Signed)
[place] LEVEL OF CARE SCREENING TOOL     IDENTIFICATION  Patient Name: Micheal Nicholson Birthdate: [birth year] Sex: male Admission Date (Current Location): 10/08/2016  [facility] and [place] Number:  Herbalist and Address:  [facility],  [place] [address], [place]      Provider Number: [id number]  Attending Physician Name and Address:  Murlean Iba, MD  Relative Name and Phone Number:       Current Level of Care: Hospital Recommended Level of Care: [place] Prior Approval Number:    Date Approved/Denied:   PASRR Number: [id number]  Discharge Plan: SNF    Current Diagnoses: Patient Active Problem List   Diagnosis Date Noted  . Acute on chronic systolic CHF (congestive heart failure) ([place]) 10/08/2016  . Acute respiratory failure with hypoxia ([place]) 10/08/2016  . CKD (chronic kidney disease), stage III 10/08/2016  . Sacral fracture, closed ([place]) 10/08/2016  . Hypoxia   . Hypokalemia 08/24/2016  . Community acquired pneumonia 08/23/2016  . Pruritus 08/09/2016  . Chronic anticoagulation 04/28/2016  . Cardiac pacemaker in situ 04/28/2016  . Chronic systolic congestive heart failure ([place]) 04/28/2016  . Left hemiparesis ([place]) 04/25/2016  . Encounter for therapeutic drug monitoring 04/19/2016  . Cardiomyopathy, ischemic   . Cerebral embolism with cerebral infarction 03/03/2016  . Normocytic anemia   . Thrombocytopenia ([place])   . Acute left hemiparesis ([place])   . Peripheral neuropathic pain   . Hemiplegia ([place]) 03/02/2016  . Adenopathy   . Long term current use of anticoagulant therapy 01/23/2016  . CAD (coronary artery disease) 01/23/2016  . Second degree Mobitz II AV block 01/13/2016  . Chronic systolic CHF (congestive heart failure) ([place])   . Persistent atrial fibrillation ([place])   . Atrial fibrillation with slow ventricular response ([place])   . AKI (acute kidney injury) ([place])   . Bradycardia  12/29/2015  . Acute renal failure (ARF) ([place]) 12/29/2015  . Hyponatremia 12/29/2015  . Hypotension 12/29/2015  . Elevated troponin 12/29/2015  . Acute upper respiratory infection 12/23/2015  . Chronic combined systolic and diastolic CHF, NYHA class 2 ([place]) 04/17/2015  . Vitamin D deficiency 03/11/2015  . Cardiomyopathy -etiology uncertain [id number]  . Paroxysmal atrial fibrillation ([place]) 08/27/2014  . 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 H/O: stroke 08/27/2014  . GERD (gastroesophageal reflux disease) 02/07/2011  . BPH (benign prostatic hypertrophy) 07/12/2010  . Anxiety state 11/02/2009  . Essential hypertension 11/02/2009    Orientation RESPIRATION BLADDER Height & Weight     Self, Place, Time, Situation  O2 (2L) Continent Weight: 163 lb 9.6 oz (74.2 kg) Height:  5\' 10"  (177.8 cm)  BEHAVIORAL SYMPTOMS/MOOD NEUROLOGICAL BOWEL NUTRITION STATUS      Continent Diet (Heart)  AMBULATORY STATUS COMMUNICATION OF NEEDS Skin   Extensive Assist Verbally Normal                       Personal Care Assistance Level of Assistance  Bathing, Feeding, Dressing Bathing Assistance: Limited assistance Feeding assistance: Limited assistance Dressing Assistance: Limited assistance     Functional Limitations Info             SPECIAL CARE FACTORS FREQUENCY  PT (By licensed PT), OT (By licensed OT)     PT Frequency: 5 OT Frequency: 5            Contractures Contractures Info: Not present    Additional Factors Info  Code Status, Allergies, Psychotropic Code Status Info: Fullcode  Allergies Info: Chocolate           Current Medications (10/09/2016):  This is the current hospital active medication list Current Facility-Administered Medications  Medication Dose Route Frequency Provider Last Rate Last Dose  . 0.9 %  sodium chloride infusion  250 mL Intravenous PRN Vianne Bulls, MD      . acetaminophen (TYLENOL) tablet 650 mg  650 mg Oral Q4H PRN Vianne Bulls, MD      .  ALPRAZolam Duanne Moron) tablet 0.5 mg  0.5 mg Oral BID PRN Vianne Bulls, MD      . apixaban (ELIQUIS) tablet 5 mg  5 mg Oral BID Clanford L Johnson, MD      . atorvastatin (LIPITOR) tablet 5 mg  5 mg Oral q1800 Ilene Qua Opyd, MD      . carvedilol (COREG) tablet 6.25 mg  6.25 mg Oral BID WC Ilene Qua Opyd, MD   6.25 mg at 10/09/16 1000  . dutasteride (AVODART) capsule 0.5 mg  0.5 mg Oral Daily Ilene Qua Opyd, MD   0.5 mg at 10/09/16 1001  . fluticasone (FLONASE) 50 MCG/ACT nasal spray 2 spray  2 spray Each Nare BID PRN Vianne Bulls, MD      . Derrill Memo ON 10/10/2016] furosemide (LASIX) tablet 40 mg  40 mg Oral Daily Clanford L Johnson, MD      . gabapentin (NEURONTIN) capsule 200 mg  200 mg Oral TID Vianne Bulls, MD   200 mg at 10/09/16 1000  . HYDROcodone-acetaminophen (NORCO/VICODIN) 5-325 MG per tablet 1-2 tablet  1-2 tablet Oral Q4H PRN Vianne Bulls, MD   1 tablet at 10/09/16 0049  . HYDROmorphone (DILAUDID) injection 0.5-1 mg  0.5-1 mg Intravenous Q3H PRN Vianne Bulls, MD      . Derrill Memo ON 10/10/2016] Influenza vac split quadrivalent PF (FLUARIX) injection 0.5 mL  0.5 mL Intramuscular Tomorrow-1000 Ilene Qua Opyd, MD      . lisinopril (PRINIVIL,ZESTRIL) tablet 5 mg  5 mg Oral Daily Clanford L Johnson, MD      . MEDLINE mouth rinse  15 mL Mouth Rinse BID Ilene Qua Opyd, MD      . ondansetron (ZOFRAN) injection 4 mg  4 mg Intravenous Q6H PRN Ilene Qua Opyd, MD      . pantoprazole (PROTONIX) EC tablet 40 mg  40 mg Oral Daily Vianne Bulls, MD   40 mg at 10/09/16 1000  . polyethylene glycol (MIRALAX / GLYCOLAX) packet 17 g  17 g Oral Daily PRN Ilene Qua Opyd, MD      . potassium chloride (K-DUR,KLOR-CON) CR tablet 10 mEq  10 mEq Oral Daily Vianne Bulls, MD   10 mEq at 10/09/16 1000  . pramipexole (MIRAPEX) tablet 0.125 mg  0.125 mg Oral QHS Ilene Qua Opyd, MD   0.125 mg at 10/09/16 0049  . senna-docusate (Senokot-S) tablet 1 tablet  1 tablet Oral QHS PRN Vianne Bulls, MD      . sertraline  (ZOLOFT) tablet 150 mg  150 mg Oral Daily Vianne Bulls, MD   150 mg at 10/09/16 1000  . sodium chloride flush (NS) 0.9 % injection 3 mL  3 mL Intravenous Q12H Vianne Bulls, MD   3 mL at 10/08/16 2330  . sodium chloride flush (NS) 0.9 % injection 3 mL  3 mL Intravenous PRN Vianne Bulls, MD      . tamsulosin (FLOMAX) capsule 0.4 mg  0.4 mg Oral Daily Christia Reading  S Opyd, MD   0.4 mg at 10/09/16 1000  . triamcinolone 0.1 % cream : eucerin cream, 1:1 1 application  1 application Topical BID PRN Vianne Bulls, MD         Discharge Medications: Please see discharge summary for a list of discharge medications.  Relevant Imaging Results:  Relevant Lab Results:   Additional Information SSN [id number]  Standley Brooking, LCSW

## 2016-10-09 NOTE — Care Management Obs Status (Signed)
[place] NOTIFICATION   Patient Details  Name: Micheal Nicholson MRN: [id number] Date of Birth: 07-01-1920   Medicare Observation Status Notification Given:  Yes    MahabirJuliann Pulse, RN 10/09/2016, 12:10 PM

## 2016-10-10 DIAGNOSIS — F411 Generalized anxiety disorder: Secondary | ICD-10-CM | POA: Diagnosis not present

## 2016-10-10 DIAGNOSIS — N183 Chronic kidney disease, stage 3 (moderate): Secondary | ICD-10-CM | POA: Diagnosis not present

## 2016-10-10 DIAGNOSIS — J9601 Acute respiratory failure with hypoxia: Secondary | ICD-10-CM | POA: Diagnosis not present

## 2016-10-10 DIAGNOSIS — I5023 Acute on chronic systolic (congestive) heart failure: Secondary | ICD-10-CM | POA: Diagnosis not present

## 2016-10-10 DIAGNOSIS — I2510 Atherosclerotic heart disease of native coronary artery without angina pectoris: Secondary | ICD-10-CM | POA: Diagnosis not present

## 2016-10-10 LAB — BASIC METABOLIC PANEL
ANION GAP: 7 (ref 5–15)
BUN: 20 mg/dL (ref 6–20)
CO2: 26 mmol/L (ref 22–32)
CREATININE: 1.19 mg/dL (ref 0.61–1.24)
Calcium: 8.7 mg/dL — ABNORMAL LOW (ref 8.9–10.3)
Chloride: 104 mmol/L (ref 101–111)
GFR, EST AFRICAN AMERICAN: 58 mL/min — AB (ref >60)
GFR, EST NON AFRICAN AMERICAN: 50 mL/min — AB (ref >60)
GLUCOSE: 100 mg/dL — AB (ref 65–99)
Potassium: 3.9 mmol/L (ref 3.5–5.1)
Sodium: 137 mmol/L (ref 135–145)

## 2016-10-10 MED ORDER — HYDROCODONE-ACETAMINOPHEN 5-325 MG PO TABS: 1.0000 | ORAL_TABLET | Freq: Four times a day (QID) | ORAL | 0 refills | Status: DC | PRN

## 2016-10-10 NOTE — Care Management Note (Signed)
Case Management Note  Patient Details  Name: Micheal Nicholson MRN: [id number] Date of Birth: [birth year]  Subjective/Objective:  DC home w/HHPT ordered-[facility] rep Manuela Schwartz aware of d/c.RW ordered but patient already has rw-Gene son agreed that rw not needed.[facility] dme rep Jermaine aware of rw not needed even though ordered.No further CM needs.                   Action/Plan:d/c home w/HHC.   Expected Discharge Date:                  Expected Discharge Plan:     In-House Referral:  Clinical Social Work  Discharge planning Services  CM Consult  Post Acute Care Choice:  Durable Medical Equipment (4 wheeled rw, 3n1) Choice offered to:  Patient  DME Arranged:    DME Agency:     HH Arranged:  PT [place] Agency:  [facility]  Status of Service:  Completed, signed off  If discussed at [place] of Stay Meetings, dates discussed:    Additional Comments:  Dessa Phi, RN 10/10/2016, 9:31 AM

## 2016-10-10 NOTE — Discharge Summary (Signed)
Physician Discharge Summary  Micheal Nicholson [id number] DOB: 06-Jul-1920 DOA: 10/08/2016  PCP: [name], MD  Admit date: 10/08/2016 Discharge date: 10/10/2016  Admitted From: HOME Disposition:  Home (Pt refused SNF)  Recommendations for Outpatient Follow-up:  1. Follow up with PCP in 3 days and cardiology in 10 days  Home Health: YES  Discharge Condition: STABLE CODE STATUS: FULL Diet recommendation: Heart Healthy  Brief/Interim Summary: HPI: Micheal Nicholson is a 82 y.o. male with medical history significant for hypertension, paroxysmal atrial fibrillation, chronic systolic CHF, depression, and anxiety who presents to the emergency department for evaluation of dyspnea and sacral pain following a fall at home. Patient reports insidious development of exertional dyspnea beginning about 2 weeks ago. This was felt to be secondary to CHF and his morning Lasix dose was doubled for 3 days starting on 09/29/2016. Patient reports improvement with the increase in Lasix, but his exertional dyspnea has been worsening again since he went back down to his regular dose. By yesterday, patient reports dyspnea while at rest and has noted development of bilateral leg swelling. Patient denies chest pain or palpitations and denies fevers or chills. There has been a dry cough. Yesterday evening, patient became very dyspneic while trying to get to the restroom and fell backwards, striking his tailbone on a portable heater. He has continued to bear weight and ambulate since the fall, but has severe pain at the sacrum, but is activity is mostly limited by dyspnea.  ED Course: Upon arrival to the ED, patient is found to be afebrile, saturating 89% on room air while at rest, and with vitals otherwise stable. EKG reveals a ventricular-paced rhythm. Chest x-ray features diffuse bilateral airspace opacities and trace bilateral effusions consistent with acute CHF. Chemistry panel features a serum  creatinine 1.12 which is better than recent priors. CBC features a stable normocytic anemia with hemoglobin of 12.2. Troponin is undetectable and BNP is elevated to 517. Patient was treated with 40 mg IV Lasix and has begun to diurese. He continues to be in respiratory distress and desaturates with any activity. He'll be observed on the telemetry unit for ongoing evaluation and management of acute on chronic systolic CHF and acute sacral fracture.  Hospital course: Micheal Nicholson a [id number] y.o.malewith medical history significant for hypertension, paroxysmal atrial fibrillation, chronic systolic CHF, depression, and anxiety who presents to the emergency department for evaluation of dyspnea and sacral pain following a fall at home.  Assessment & Plan:   1. Acute on chronic systolic CHF with acute hypoxic respiratory failure - Has progressive dyspnea and leg swelling with crackles on exam, elevated BNP, and pulmonary edema on CXR  - "Dry weight" appears to be ~160 lbs; admission wt pending  - TTE (08/23/16) with EF 35-40%, mild-moderate AR, TR, and MR; mild LAE; moderate increased PA pressures - Managed at home wiith Lasix 40 mg QD, Coreg 6.25 BID, and lisinopril 10 qD  - Given 40 mg IV Lasix in ED; diuresis with Lasix 40 mg IV q12h in hospital, diuresed 4L,resume home lasix at discharge  - Continue Coreg; lisinopril held on admission while diuresing to avoid precipitating AKI, resume at discharge - SLIV, follow strict I/O's and daily wts, fluid-restrict   2. Fall with distal sacral fracture  - Pt suffered mechanical fall the night of 10/14, striking tailbone on a ceramic portable heater  - Radiographs suggest fracture  - PT eval requested; pain-control with prn's patient already declining skilled nursing rehabilitation. HHPT ordered.  3. Paroxysmal atrial fibrillation  - CHADS-VASc at least 7 (age x2, CVA x2, CAD, HTN, CHF) - Continue AC with Eliquis  - Rate is well-controlled on  Coreg, will continue   4. CAD - No anginal complaints on admission and troponin undetectable  - Continue Lipitor, ASA 81, Coreg  5. CKD stage III  - SCr 1.12 on admission, best in a long time  - Follow daily BMP while diuresing; lisinopril held initially while diuresing, can be resumed at discharge  6. Hypertension  - At goal currently  - Continue Coreg 6.25 mg BID   7. Depression, anxiety  - Stable  - Continue Zoloft and prn Xanax  DVT prophylaxis:Eliquis Code Status:Full  Family Communication:Son updated at patient's request Disposition Plan:discharge 10/17to home, refused SNF, Home Health PT ordered.  Consults called:None Admission status:Observation   Discharge Diagnoses:  Principal Problem:   Acute on chronic systolic CHF (congestive heart failure) (HCC) Active Problems:   Anxiety state   Essential hypertension   Paroxysmal atrial fibrillation (HCC)   CAD (coronary artery disease)   Acute respiratory failure with hypoxia (HCC)   CKD (chronic kidney disease), stage III   Sacral fracture, closed ([place])  Discharge Instructions     Medication List    TAKE these medications   ALPRAZolam 0.5 MG tablet Commonly known as:  XANAX Take 1 tablet (0.5 mg total) by mouth 2 (two) times daily as needed for anxiety. What changed:  when to take this   atorvastatin 10 MG tablet Commonly known as:  LIPITOR Take 0.5 tablets (5 mg total) by mouth daily at 6 PM.   bisacodyl 5 MG EC tablet Commonly known as:  DULCOLAX Take 5 mg by mouth daily as needed for moderate constipation.   carvedilol 6.25 MG tablet Commonly known as:  COREG Take 6.25 mg by mouth 2 (two) times daily with a meal. What changed:  Another medication with the same name was removed. Continue taking this medication, and follow the directions you see here.   CORTIZONE-10 1 % ointment Generic drug:  hydrocortisone Apply 1 application topically 2 (two) times daily as needed for itching.    dutasteride 0.5 MG capsule Commonly known as:  AVODART Take 1 capsule (0.5 mg total) by mouth daily.   ELIQUIS 5 MG Tabs tablet Generic drug:  apixaban Take 5 mg by mouth 2 (two) times daily.   fluticasone 50 MCG/ACT nasal spray Commonly known as:  FLONASE Place 2 sprays into both nostrils 2 (two) times daily as needed for rhinitis.   furosemide 40 MG tablet Commonly known as:  LASIX Take 1 tablet (40 mg total) by mouth daily.   gabapentin 100 MG capsule Commonly known as:  NEURONTIN Take 200 mg by mouth 2 (two) times daily.   HYDROcodone-acetaminophen 5-325 MG tablet Commonly known as:  NORCO/VICODIN Take 1 tablet by mouth every 6 (six) hours as needed for severe pain.   KLOR-CON M10 10 MEQ tablet Generic drug:  potassium chloride TAKE 1 TABLET BY MOUTH EVERY DAY   lisinopril 5 MG tablet Commonly known as:  PRINIVIL,ZESTRIL Take 5 mg by mouth daily.   omeprazole 20 MG capsule Commonly known as:  PRILOSEC Take 20 mg by mouth daily as needed (for acid reflex).   pramipexole 0.125 MG tablet Commonly known as:  MIRAPEX TAKE 1 TABLET BY MOUTH AT BEDTIME   senna-docusate 8.6-50 MG tablet Commonly known as:  Senokot-S Take 1 tablet by mouth at bedtime as needed for mild constipation.   sertraline  100 MG tablet Commonly known as:  ZOLOFT Take 150 mg by mouth daily.   tamsulosin 0.4 MG Caps capsule Commonly known as:  FLOMAX Take 1 capsule (0.4 mg total) by mouth daily.   triamcinolone 0.1 % cream : eucerin Crea Apply 1 application topically 2 (two) times daily as needed for rash, itching or irritation.   VITAMIN D PO Take 1 tablet by mouth daily with breakfast.      Follow-up Information    [facility] .   Why:  [facility]. Contact information: [place] [address] [phone]        [name], MD. Schedule an appointment as soon as possible for a visit in 3 day(s).   Specialty:  Family  Medicine Why:  Hospital follow Up Contact information: [place] [address] [phone]        Sanda Klein, MD. Schedule an appointment as soon as possible for a visit in 58 day(s).   Specialty:  Cardiology Why:  Hospital Follow Up Contact information: [address] [place]  [address] [phone]          Allergies  Allergen Reactions  . Chocolate Other (See Comments)    Reaction:  Acid reflux     Procedures/Studies: Dg Chest 2 View  Result Date: 10/08/2016 CLINICAL DATA:  Shortness of breath EXAM: CHEST  2 VIEW COMPARISON:  08/23/2016 FINDINGS: Chronic cardiopericardial enlargement. Single chamber pacer from the left to the right ventricle. Vascular pedicle widening, diffuse interstitial opacity and trace pleural effusions. IMPRESSION: CHF. Electronically Signed   By: Monte Fantasia M.D.   On: 10/08/2016 21:46   Dg Sacrum/coccyx  Result Date: 10/08/2016 CLINICAL DATA:  82 year old male with fall and trauma to the tailbone EXAM: SACRUM AND COCCYX - 2+ VIEW COMPARISON:  Radiograph dated 10/15/2013 FINDINGS: There is focal discontinuity of the distal sacral bone seen on the lateral projection which is age indeterminate. Although this may be chronic acute fracture is not excluded. Clinical correlation is recommended. No other definite acute fracture identified. The bones are osteopenic. There are degenerative changes of the visualized lower lumbar spine and SI joints. Soft tissues are grossly unremarkable. IMPRESSION: Age indeterminate fracture of the distal sacrum. Clinical correlation recommended. Electronically Signed   By: Anner Crete M.D.   On: 10/08/2016 22:38    Subjective: Pt says he feels much better,  He wants to go home.  Says he will follow up as recommended.   Discharge Exam: Vitals:   10/10/16 0629 10/10/16 0800  BP: 117/76 94/62  Pulse: 62 62  Resp: 20   Temp: 97.7 F (36.5 C)    Vitals:   10/09/16  2106 10/10/16 0629 10/10/16 0630 10/10/16 0800  BP: 114/60 117/76  94/62  Pulse: 63 62  62  Resp: 18 20    Temp: 97.9 F (36.6 C) 97.7 F (36.5 C)    TempSrc: Oral Oral    SpO2: 98% 98%    Weight:   72.2 kg (159 lb 3.2 oz)   Height:        General exam: awake, alert, no apparent distress, cooperative. Respiratory system: Rare crackles heard. No increased work of breathing. Cardiovascular system: S1 & S2 heard. Gastrointestinal system: Abdomen is nondistended, soft and nontender. Normal bowel sounds heard. Central nervous system: Alert and oriented. No focal neurological deficits. Extremities: 1+ pedal edema bilateral lower extremities.  The results of significant diagnostics from this hospitalization (including imaging, microbiology, ancillary  and laboratory) are listed below for reference.     Microbiology: No results found for this or any previous visit (from the past 240 hour(s)).   Labs: BNP (last 3 results)  Recent Labs  05/05/16 1904 08/23/16 0244 10/08/16 2058  BNP 542.1* 1,296.9* [id number]*   Basic Metabolic Panel:  Recent Labs Lab 10/08/16 2133 10/09/16 0501 10/10/16 0455  NA 138 140 137  K 4.3 3.8 3.9  CL 108 108 104  CO2 23 26 26   GLUCOSE 101* 96 100*  BUN 21* 20 20  CREATININE 1.12 1.30* 1.19  CALCIUM 8.8* 8.8* 8.7*   Liver Function Tests:  Recent Labs Lab 10/08/16 2133  AST 24  ALT 17  ALKPHOS 54  BILITOT 1.8*  PROT 6.9  ALBUMIN 3.9   No results for input(s): LIPASE, AMYLASE in the last 168 hours. No results for input(s): AMMONIA in the last 168 hours. CBC:  Recent Labs Lab 10/08/16 2058  WBC 5.5  NEUTROABS 4.1  HGB 12.2*  HCT 35.8*  MCV 84.4  PLT 268   Cardiac Enzymes:  Recent Labs Lab 10/08/16 2058  TROPONINI <0.03   BNP: Invalid input(s): POCBNP CBG: No results for input(s): GLUCAP in the last 168 hours. D-Dimer No results for input(s): DDIMER in the last 72 hours. Hgb A1c No results for input(s): HGBA1C in the  last 72 hours. Lipid Profile No results for input(s): CHOL, HDL, LDLCALC, TRIG, CHOLHDL, LDLDIRECT in the last 72 hours. Thyroid function studies No results for input(s): TSH, T4TOTAL, T3FREE, THYROIDAB in the last 72 hours.  Invalid input(s): FREET3 Anemia work up No results for input(s): VITAMINB12, FOLATE, FERRITIN, TIBC, IRON, RETICCTPCT in the last 72 hours. Urinalysis    Component Value Date/Time   COLORURINE YELLOW 10/08/2016 2316   APPEARANCEUR CLEAR 10/08/2016 2316   LABSPEC 1.014 10/08/2016 2316   PHURINE 7.0 10/08/2016 2316   GLUCOSEU NEGATIVE 10/08/2016 2316   HGBUR NEGATIVE 10/08/2016 2316   BILIRUBINUR NEGATIVE 10/08/2016 2316   BILIRUBINUR negative 11/10/2013 1713   KETONESUR NEGATIVE 10/08/2016 2316   PROTEINUR NEGATIVE 10/08/2016 2316   UROBILINOGEN 1.0 03/14/2015 1608   NITRITE NEGATIVE 10/08/2016 2316   LEUKOCYTESUR NEGATIVE 10/08/2016 2316   Sepsis Labs Invalid input(s): PROCALCITONIN,  WBC,  LACTICIDVEN Microbiology No results found for this or any previous visit (from the past 240 hour(s)).   Time coordinating discharge: 29 minutes  SIGNED:  Irwin Brakeman, MD  [facility] 10/10/2016, 10:35 AM Pager   If 7PM-7AM, please contact night-coverage [email or web address] Password [email or web address]

## 2016-10-10 NOTE — Discharge Instructions (Signed)
DO NOT DRIVE OR OPERATE MACHINERY WHILE TAKING PAIN MEDICATIONS     Fall Prevention in the Home  Falls can cause injuries. They can happen to people of all ages. There are many things you can do to make your home safe and to help prevent falls.  WHAT CAN I DO ON THE OUTSIDE OF MY HOME?  Regularly fix the edges of walkways and driveways and fix any cracks.  Remove anything that might make you trip as you walk through a door, such as a raised step or threshold.  Trim any bushes or trees on the path to your home.  Use bright outdoor lighting.  Clear any walking paths of anything that might make someone trip, such as rocks or tools.  Regularly check to see if handrails are loose or broken. Make sure that both sides of any steps have handrails.  Any raised decks and porches should have guardrails on the edges.  Have any leaves, snow, or ice cleared regularly.  Use sand or salt on walking paths during winter.  Clean up any spills in your garage right away. This includes oil or grease spills. WHAT CAN I DO IN THE BATHROOM?   Use night lights.  Install grab bars by the toilet and in the tub and shower. Do not use towel bars as grab bars.  Use non-skid mats or decals in the tub or shower.  If you need to sit down in the shower, use a plastic, non-slip stool.  Keep the floor dry. Clean up any water that spills on the floor as soon as it happens.  Remove soap buildup in the tub or shower regularly.  Attach bath mats securely with double-sided non-slip rug tape.  Do not have throw rugs and other things on the floor that can make you trip. WHAT CAN I DO IN THE BEDROOM?  Use night lights.  Make sure that you have a light by your bed that is easy to reach.  Do not use any sheets or blankets that are too big for your bed. They should not hang down onto the floor.  Have a firm chair that has side arms. You can use this for support while you get dressed.  Do not have throw rugs  and other things on the floor that can make you trip. WHAT CAN I DO IN THE KITCHEN?  Clean up any spills right away.  Avoid walking on wet floors.  Keep items that you use a lot in easy-to-reach places.  If you need to reach something above you, use a strong step stool that has a grab bar.  Keep electrical cords out of the way.  Do not use floor polish or wax that makes floors slippery. If you must use wax, use non-skid floor wax.  Do not have throw rugs and other things on the floor that can make you trip. WHAT CAN I DO WITH MY STAIRS?  Do not leave any items on the stairs.  Make sure that there are handrails on both sides of the stairs and use them. Fix handrails that are broken or loose. Make sure that handrails are as long as the stairways.  Check any carpeting to make sure that it is firmly attached to the stairs. Fix any carpet that is loose or worn.  Avoid having throw rugs at the top or bottom of the stairs. If you do have throw rugs, attach them to the floor with carpet tape.  Make sure that you have a  light switch at the top of the stairs and the bottom of the stairs. If you do not have them, ask someone to add them for you. WHAT ELSE CAN I DO TO HELP PREVENT FALLS?  Wear shoes that:  Do not have high heels.  Have rubber bottoms.  Are comfortable and fit you well.  Are closed at the toe. Do not wear sandals.  If you use a stepladder:  Make sure that it is fully opened. Do not climb a closed stepladder.  Make sure that both sides of the stepladder are locked into place.  Ask someone to hold it for you, if possible.  Clearly mark and make sure that you can see:  Any grab bars or handrails.  First and last steps.  Where the edge of each step is.  Use tools that help you move around (mobility aids) if they are needed. These include:  Canes.  Walkers.  Scooters.  Crutches.  Turn on the lights when you go into a dark area. Replace any light bulbs  as soon as they burn out.  Set up your furniture so you have a clear path. Avoid moving your furniture around.  If any of your floors are uneven, fix them.  If there are any pets around you, be aware of where they are.  Review your medicines with your doctor. Some medicines can make you feel dizzy. This can increase your chance of falling. Ask your doctor what other things that you can do to help prevent falls.   This information is not intended to replace advice given to you by your health care provider. Make sure you discuss any questions you have with your health care provider.   Document Released: 10/07/2009 Document Revised: 04/27/2015 Document Reviewed: 01/15/2015 Elsevier Interactive Patient Education 2016 [employer]  Simple Pelvic Fracture, Adult A pelvic fracture is a break in one of the pelvic bones. The pelvic bones include the bones that you sit on and the bones that make up the lower part of your spine. A pelvic fracture is called simple if the broken bones are stable and are not moving out of place. CAUSES  Common causes of this type of fracture include:  A fall.  A car accident.  Force or pressure applied to the pelvis. RISK FACTORS You may be at higher risk for this type of fracture if:  You play high-impact sports.  You are an older person with a condition that causes weak bones (osteoporosis).  You have a bone-weakening disease. SIGNS AND SYMPTOMS Signs and symptoms may include:  Tenderness, swelling, or bruising in the affected area.  Pain when moving the hip.  Pain when walking or standing. DIAGNOSIS A diagnosis is made with a physical exam and X-rays. Sometimes, a CT scan is also done. TREATMENT The goal of treatment is to get the bones to heal in a good position. Treatment of a simple pelvic fracture usually involves staying in bed (bed rest) and using crutches or a walker until the bones heal. Medicines may be prescribed for pain. Medicines may  also be prescribed that help to prevent blood clots from forming in the legs. HOME CARE INSTRUCTIONS Managing Pain, Stiffness, and Swelling  If directed, apply ice to the injured area:  Put ice in a plastic bag.  Place a towel between your skin and the bag.  Leave the ice on for 20 minutes, 2-3 times a day.  Raise the injured area above the level of your heart while you  are sitting or lying down. Driving  Do not  drive or operate heavy machinery until your health care provider tells you it is safe to do. Activity  Stay on bed rest for as long as directed by your health care provider.  While on bed rest:  Change the position of your legs every 1-2 hours. This keeps blood moving well through both of your legs.  You may sit for as long as you feel comfortable.  After bed rest:  Avoid strenuous activities for as long as directed by your health care provider.  Return to your normal activities as directed by your health care provider. Ask your health care provider what activities are safe for you. Safety  Do not use the injured limb to support your body weight until your health care provider says that you can. Use crutches or a walker as directed by your health care provider. General Instructions  Do not use any tobacco products, including cigarettes, chewing tobacco, or electronic cigarettes. Tobacco can delay bone healing. If you need help quitting, ask your health care provider.  Take medicines only as directed by your health care provider.  Keep all follow-up visits as directed by your health care provider. This is important. SEEK MEDICAL CARE IF:  Your pain gets worse.  Your pain is not relieved with medicines. SEEK IMMEDIATE MEDICAL CARE IF:  You feel light-headed or faint.  You develop chest pain.  You develop shortness of breath.  You have a fever.  You have blood in your urine or your stools.  You have vaginal bleeding.  You have difficulty or pain with  urination or with a bowel movement.  You have difficulty or increased pain with walking.  You have new or increased swelling in one of your legs.  You have numbness in your legs or groin area.   This information is not intended to replace advice given to you by your health care provider. Make sure you discuss any questions you have with your health care provider.   Document Released: 02/19/2002 Document Revised: 01/01/2015 Document Reviewed: 08/04/2014 Elsevier Interactive Patient Education [employer].

## 2016-10-11 ENCOUNTER — Encounter: Payer: Self-pay | Admitting: Family Medicine

## 2016-10-11 ENCOUNTER — Ambulatory Visit (INDEPENDENT_AMBULATORY_CARE_PROVIDER_SITE_OTHER): Payer: Commercial Managed Care - HMO | Admitting: Family Medicine

## 2016-10-11 VITALS — BP 100/60 | HR 62 | Temp 97.4°F | Ht 70.0 in | Wt 156.6 lb

## 2016-10-11 DIAGNOSIS — I481 Persistent atrial fibrillation: Secondary | ICD-10-CM

## 2016-10-11 DIAGNOSIS — I5022 Chronic systolic (congestive) heart failure: Secondary | ICD-10-CM | POA: Diagnosis not present

## 2016-10-11 DIAGNOSIS — I4819 Other persistent atrial fibrillation: Secondary | ICD-10-CM

## 2016-10-11 DIAGNOSIS — I10 Essential (primary) hypertension: Secondary | ICD-10-CM | POA: Diagnosis not present

## 2016-10-11 DIAGNOSIS — S3210XD Unspecified fracture of sacrum, subsequent encounter for fracture with routine healing: Secondary | ICD-10-CM | POA: Diagnosis not present

## 2016-10-11 DIAGNOSIS — Z79899 Other long term (current) drug therapy: Secondary | ICD-10-CM

## 2016-10-11 MED ORDER — TRAMADOL HCL 50 MG PO TABS: 50.0000 mg | ORAL_TABLET | Freq: Four times a day (QID) | ORAL | 1 refills | Status: DC | PRN

## 2016-10-11 NOTE — Patient Instructions (Signed)
DECREASE THE SERTRALINE TO 50 MG (ONE HALF) OF 100 MG ONCE DAILY HOLD THE GAPABENTIN FOR NOW HOLD THE MIRAPEX FOR NOW MAY USE ULTRAM ONE EVERY 6 HOURS BUT ONLY GIVE AS NEEDED FOR PAIN. CONSIDER HOLD ALPRAZOLAM FOR INCREASED SEDATION.

## 2016-10-11 NOTE — Progress Notes (Signed)
Subjective:     Patient ID: Micheal Nicholson, male   DOB: 09/12/1920, 82 y.o.   MRN: [id number]  HPI Patient is seen for hospital follow-up. Accompanied by his son.  He has multiple chronic problems including history of CAD, ischemic cardiomyopathy, history of CVA, chronic systolic heart failure, hypertension, atrial fibrillation, chronic anxiety, chronic kidney disease, history of normocytic anemia. Was recently admitted on 10/08/2016 for dyspnea and sacral pain following a fall at home. He was afebrile on admission but had low O2 sats and chest x-ray had findings consistent with CHF exacerbation. Creatinine stable 1.12. BNP level 517. He was treated with IV Lasix. X-rays revealed probable sacral fracture.  Patient was discharged on his usual medications with carvedilol, lisinopril, and Lasix 40 mg daily. Lisinopril was held during hospitalization  Patient had fall on the night of 10/14 striking sacral area on a ceramic portable heater with x-ray suggesting fracture. He has home PT set up though nothing has been initiated yet. He was discharged on hydrocodone but family states this makes him very nauseous. Had some leftover tramadol and/or giving him one every 6 hours instead. There was recommendation to consider nursing rehabilitation and apparently patient refused and was taken home instead.  He is very sedated and groggy in clinic today. They did not report any fevers. He's had no complaints of chest pain. He is on multiple medications which could be contributing including gabapentin, Ultram, alprazolam. He also takes fairly high dose of sertraline 150 mg daily for history of anxiety and depression and we cautioned them about increased risk of serotonin syndrome with combination of Ultram, gabapentin, and sertraline. No reported fever.  Family with him 24/7.    Patient is a DO NOT RESUSCITATE  Past Medical History:  Diagnosis Date  . Anxiety   . Arthritis   . BPH (benign prostatic  hyperplasia)   . CHF (congestive heart failure) ([place])   . Chronic combined systolic and diastolic CHF, NYHA class 2 ([place]) 04/17/2015  . GERD (gastroesophageal reflux disease)   . Hypertension   . Stroke ([place])   . Symptomatic sinus bradycardia    PPM Dr. Rayann Heman, 01/13/16, STJ device   Past Surgical History:  Procedure Laterality Date  . CARDIAC CATHETERIZATION  2006   Negative / 4 years ago  . EP IMPLANTABLE DEVICE N/A 01/13/2016   Procedure: Pacemaker Implant;  Surgeon: Thompson Grayer, MD;  Location: [place];  Service: Cardiovascular;  Laterality: N/A;    reports that he has quit smoking. He has a 2.50 pack-year smoking history. He has never used smokeless tobacco. He reports that he drinks about 0.6 oz of alcohol per week . He reports that he does not use drugs. family history includes Healthy in his son; Heart disease in his brother; Heart failure in his son; Lung cancer in his brother; Pancreatic cancer in his son; Pneumonia (age of onset: 62) in his father. Allergies  Allergen Reactions  . Chocolate Other (See Comments)    Reaction:  Acid reflux      Review of Systems  Constitutional: Positive for fatigue. Negative for appetite change, chills and fever.  Respiratory: Negative for cough.   Cardiovascular: Negative for chest pain.  Gastrointestinal: Negative for abdominal pain, diarrhea, nausea and vomiting.  Genitourinary: Negative for dysuria.  Skin: Negative for rash.  Neurological: Positive for dizziness, weakness and light-headedness. Negative for speech difficulty.  Psychiatric/Behavioral: Negative for agitation.   Limited history obtainable from patient as he is very sedated. He was  able to walk in with a walker unassisted    Objective:   Physical Exam  Constitutional:  Very sedated appearing 38 old male who is arousable and answers questions appropriately.  HENT:  Mouth/Throat: Oropharynx is clear and moist.  Neck: Neck supple.  Cardiovascular: Normal rate  and regular rhythm.   Pulmonary/Chest: Effort normal and breath sounds normal.  Abdominal: Soft. There is no tenderness.  Musculoskeletal:  Trace edema legs bilaterally  Neurological: No cranial nerve deficit.       Assessment:     #1 recent fall with probable sacral fracture-he appears to be heavily overmedicated at this time with some sedation but is arousable. Does not appear to be in any pain  #2 acute on chronic systolic heart failure medically stable. Weight today is 156 and his dry weight appears to be around 160 pounds  #3 history of paroxysmal atrial fibrillation  #4 history of chronic kidney disease  #5 hypertension which is stable  #6 polypharmacy.  He appears to be over medicated and is very sedated today.      Plan:     -Discontinue hydrocodone as he has been intolerant of this in the past and they're currently not giving this at home -May use tramadol up to 50 mg one every 6 hours but do not give on schedule but only if he appears to be uncomfortable- at this point he appears to be overmedicated -Reduce sertraline to 50 mg daily (we discussed risk of serotonin syndrome with multiple serotonergic meds) -Hold gabapentin at this time -Patient is a DO NOT RESUSCITATE -How long discussion with son regarding long-term care. He is requiring 24/7 supervision at this point. We have suggested they consider rehabilitation stay and they will reconsider this.  Over 40 minutes spent with patient and son discussing recent hospitalization, medications, pain management, home care resources.   [name] MD [facility]

## 2016-10-13 DIAGNOSIS — I110 Hypertensive heart disease with heart failure: Secondary | ICD-10-CM | POA: Diagnosis not present

## 2016-10-13 DIAGNOSIS — I5042 Chronic combined systolic (congestive) and diastolic (congestive) heart failure: Secondary | ICD-10-CM | POA: Diagnosis not present

## 2016-10-13 DIAGNOSIS — I69351 Hemiplegia and hemiparesis following cerebral infarction affecting right dominant side: Secondary | ICD-10-CM | POA: Diagnosis not present

## 2016-10-13 DIAGNOSIS — M25551 Pain in right hip: Secondary | ICD-10-CM | POA: Diagnosis not present

## 2016-10-13 DIAGNOSIS — I482 Chronic atrial fibrillation: Secondary | ICD-10-CM | POA: Diagnosis not present

## 2016-10-17 ENCOUNTER — Telehealth: Payer: Self-pay | Admitting: Family Medicine

## 2016-10-17 NOTE — Telephone Encounter (Signed)
When PT went to the house today pts Bp was very low today 90/64 and pt was very sleepy and after getting the pt up doing things Bp went to 100/68.  They have some concern about the pts Rx lisinopril 10 mg d/c paperwork states 5 mg and the daughter-n-law Larey Dresser [id number] is the one that administer the medication for her.

## 2016-10-18 DIAGNOSIS — I6340 Cerebral infarction due to embolism of unspecified cerebral artery: Secondary | ICD-10-CM | POA: Diagnosis not present

## 2016-10-18 MED ORDER — LISINOPRIL 5 MG PO TABS: 5.0000 mg | ORAL_TABLET | Freq: Every day | ORAL | 1 refills | Status: DC

## 2016-10-18 NOTE — Telephone Encounter (Signed)
Spoke with Denice Paradise (daughter in law) and she states that pt's strenght has improved since visit here in the office. She will keep [place] posted if anything changes. They are not going to put him in a facility for PT at this time either. She is aware that pt is taking 5mg  of Lisinopril. Refill sent to pharmacy.

## 2016-10-18 NOTE — Telephone Encounter (Signed)
Micheal Nicholson is returning autumn call

## 2016-10-18 NOTE — Telephone Encounter (Signed)
Left message with Denice Paradise to call back.

## 2016-10-18 NOTE — Telephone Encounter (Signed)
Left message for her to call back

## 2016-10-22 NOTE — Telephone Encounter (Signed)
Agree with 5 mg dose of lisinopril and HOLD altogether for consistent BP < [id number] systolic.

## 2016-10-23 ENCOUNTER — Telehealth: Payer: Self-pay | Admitting: Family Medicine

## 2016-10-23 NOTE — Telephone Encounter (Signed)
Micheal Nicholson is aware.

## 2016-10-23 NOTE — Telephone Encounter (Signed)
I recommend follow up to reassess.

## 2016-10-23 NOTE — Telephone Encounter (Signed)
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 He lethargic almost sedated. Dr. cut back on some of his rx. due to heavy sedation. Today is very unsettled. Having pain in his right cheek bone and down to his fractured tail bone. Verified    Nurse Assessment  Nurse: Harlow Mares, RN, Suanne Marker Date/Time Eilene Ghazi Time): 10/23/2016 3:36:55 PM  Confirm and document reason for call. If symptomatic, describe symptoms. You must click the next button to save text entered. 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 He lethargic almost sedated. Dr. cut back on some of his rx. due to heavy sedation. Today is very unsettled. Having pain in his right cheek bone and down to his fractured tail bone. Has CHF. Had a fx tailbone, a couple weeks ago. Taking Tramadol (less sedating). Was in hospital a couple weeks ago. His Neurontin has been decreased due to oversedation.  Has the patient traveled out of the country within the last 30 days? ---Not Applicable  Does the patient have any new or worsening symptoms? ---No  Please document clinical information provided and list any resource used. ---Caller not currently with the patient for triage. Unable to assess. Advised to call back when she is with patient. 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     Guidelines    Guideline Title Affirmed Question Affirmed Notes       Final Disposition User   Clinical Call Harlow Mares, RN, Suanne Marker

## 2016-10-23 NOTE — Telephone Encounter (Signed)
Spoke with pt's son. He states that pt is c/o right sided pain that radiates from his face to his tail-bone, pain mostly when he turns his head. He also states that pt has been having increased anxiety and moodiness. Pt is frustrated and does not like to be kept inside so he will go outside and ride the lawn mower just to get out. Pt has c/o that pain on right side is often "unbearable". Pt also still c/o pain from tailbone fracture. Son is mostly concerned about the pain as pt has been complaining increasingly over past few weeks, but also about mood/anxiety.  Dr. Elease Hashimoto - Please advise. Thanks!

## 2016-10-23 NOTE — Telephone Encounter (Signed)
Spoke with pt's son and gave recommendation for OV. Pt scheduled for 10/27/16. Nothing further needed at this time.

## 2016-10-27 ENCOUNTER — Ambulatory Visit (INDEPENDENT_AMBULATORY_CARE_PROVIDER_SITE_OTHER): Payer: Commercial Managed Care - HMO | Admitting: Family Medicine

## 2016-10-27 ENCOUNTER — Encounter: Payer: Self-pay | Admitting: Family Medicine

## 2016-10-27 VITALS — BP 110/82 | HR 61 | Ht 70.0 in | Wt 149.6 lb

## 2016-10-27 DIAGNOSIS — Z79899 Other long term (current) drug therapy: Secondary | ICD-10-CM

## 2016-10-27 DIAGNOSIS — R51 Headache: Secondary | ICD-10-CM | POA: Diagnosis not present

## 2016-10-27 DIAGNOSIS — R519 Headache, unspecified: Secondary | ICD-10-CM

## 2016-10-27 NOTE — Patient Instructions (Signed)
May increase Tramadol up to one and one half tablets up to every 6 hours as needed. Follow up for any increase pain or other concerns.

## 2016-10-27 NOTE — Progress Notes (Signed)
Subjective:     Patient ID: Micheal Nicholson, male   DOB: 1920/01/02, 82 y.o.   MRN: [id number]  HPI Patient seen for medical follow-up with chief complaint (and new problem) of right facial pain. Very unstable on his feet recently and had recent fall with x-rays suggesting possible sacral fracture. He was seen here on October 18 and extremely groggy and appeared to be overmedicated. We reduced several medications including discontinuation of hydrocodone, reduction of sertraline and discontinuation of gabapentin and he is much improved. Much more alert. In fact, he has been mowing on his riding mower for the past several days.  Is complaining of some right facial pain which is intermittent over the past several days. No pain with chewing. No oropharyngeal pain. No rash. Apparently this has been a sharp pain which is intermittent with no clear triggers. Son gave 1 tramadol which did not seem to relieve and with 1-1/2 tramadol he did get good relief of pain. Denies any burning or stinging pain. No facial weakness. Prior history of CVA.  Pain is moderate at times.    Past Medical History:  Diagnosis Date  . Anxiety   . Arthritis   . BPH (benign prostatic hyperplasia)   . CHF (congestive heart failure) ([place])   . Chronic combined systolic and diastolic CHF, NYHA class 2 ([place]) 04/17/2015  . GERD (gastroesophageal reflux disease)   . Hypertension   . Stroke ([place])   . Symptomatic sinus bradycardia    PPM Dr. Rayann Heman, 01/13/16, STJ device   Past Surgical History:  Procedure Laterality Date  . CARDIAC CATHETERIZATION  2006   Negative / 4 years ago  . EP IMPLANTABLE DEVICE N/A 01/13/2016   Procedure: Pacemaker Implant;  Surgeon: Thompson Grayer, MD;  Location: [facility];  Service: Cardiovascular;  Laterality: N/A;    reports that he has quit smoking. He has a 2.50 pack-year smoking history. He has never used smokeless tobacco. He reports that he drinks about 0.6 oz of alcohol per week . He  reports that he does not use drugs. family history includes Healthy in his son; Heart disease in his brother; Heart failure in his son; Lung cancer in his brother; Pancreatic cancer in his son; Pneumonia (age of onset: 26) in his father. Allergies  Allergen Reactions  . Chocolate Other (See Comments)    Reaction:  Acid reflux      Review of Systems  Constitutional: Negative for chills and fever.  HENT: Negative for congestion.   Eyes: Negative for pain and visual disturbance.  Respiratory: Negative for cough.   Cardiovascular: Negative for chest pain.  Skin: Negative for rash.  Neurological: Negative for dizziness, syncope, facial asymmetry, speech difficulty, weakness and headaches.  Hematological: Negative for adenopathy.  Psychiatric/Behavioral: Negative for confusion.       Objective:   Physical Exam  Constitutional: He is oriented to person, place, and time. He appears well-developed and well-nourished.  HENT:  Right Ear: External ear normal.  Left Ear: External ear normal.  Mouth/Throat: Oropharynx is clear and moist.  Neck: Neck supple.  Cardiovascular: Normal rate and regular rhythm.   Pulmonary/Chest: Effort normal and breath sounds normal.  Neurological: He is alert and oriented to person, place, and time. No cranial nerve deficit.  Skin: No rash noted.       Assessment:     #1 history of recent polypharmacy. Much improved following reduction in medications  #2 right facial pain. No evidence for TMJ. Question neuralgic. Patient at  risk for thalamic pain syndrome. Doubt trigeminal neuralgia.  Currently in no pain    Plan:     -Continue tramadol- not to exceed every 6 hours -Try to avoid medications such as gabapentin-especially if he remains on tramadol to avoid serotonin syndrome. -His son is helping to administer his medications to avoid overuse.  Micheal Post MD [facility] at [facility]

## 2016-10-27 NOTE — Progress Notes (Signed)
Pre visit review using our clinic review tool, if applicable. No additional management support is needed unless otherwise documented below in the visit note. 

## 2016-10-30 ENCOUNTER — Other Ambulatory Visit: Payer: Self-pay | Admitting: Cardiovascular Disease

## 2016-10-30 MED ORDER — CARVEDILOL 6.25 MG PO TABS: 6.2500 mg | ORAL_TABLET | Freq: Two times a day (BID) | ORAL | 6 refills | Status: DC

## 2016-10-30 NOTE — Telephone Encounter (Signed)
Rx request sent to pharmacy.  

## 2016-11-08 ENCOUNTER — Other Ambulatory Visit: Payer: Self-pay | Admitting: Family Medicine

## 2016-11-10 IMAGING — CT CT HEAD W/O CM
2 series · 16 of 30 positions shown, 19 images · non-contrast
Comparison: 03/03/2016

CLINICAL DATA: Per EMS pt sent for further evaluation per PCP for
weakness; per son pt strong and dark urine. Pt current complaint of
chronic mid back pain. Reported falls

EXAM:
CT HEAD WITHOUT CONTRAST
TECHNIQUE: Contiguous axial images were obtained from the base of the skull
through the vertex without intravenous contrast.

[Series 2: head w/o · axial · non-contrast · 0.42mm/px · z∈[-90,+30]mm · 9 of 32 slices shown, 12 images]
[im 4/32  brain]
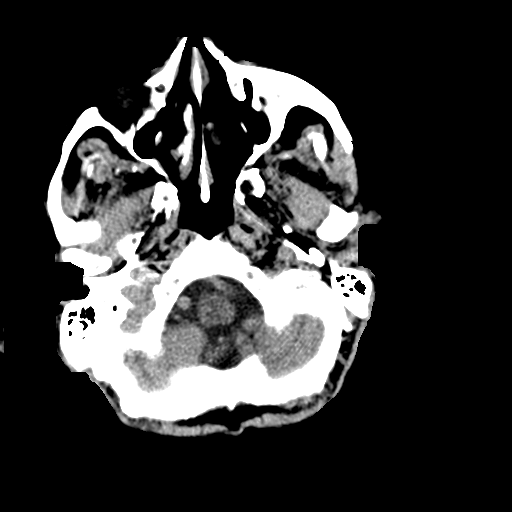
[im 4/32  bone]
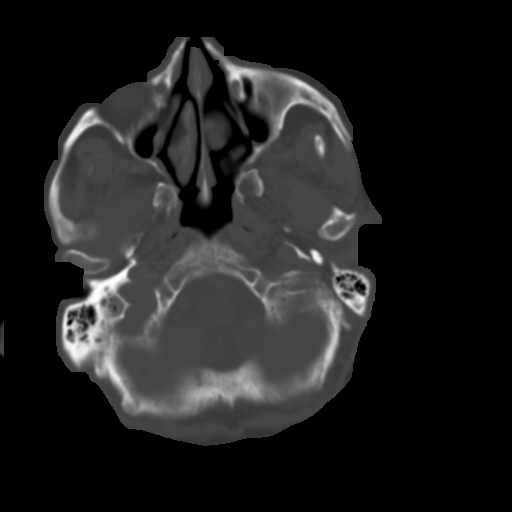
[im 7/32  brain]
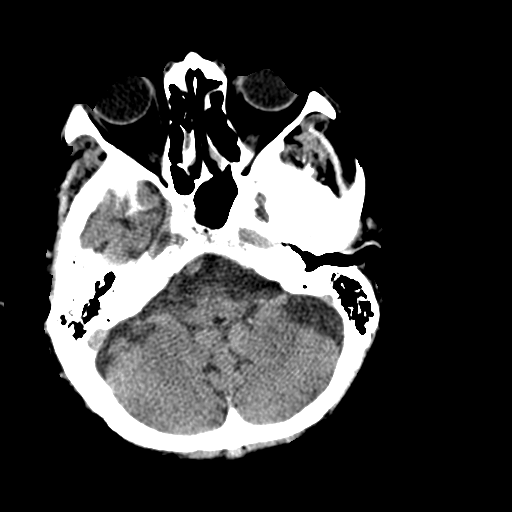
[im 10/32  brain]
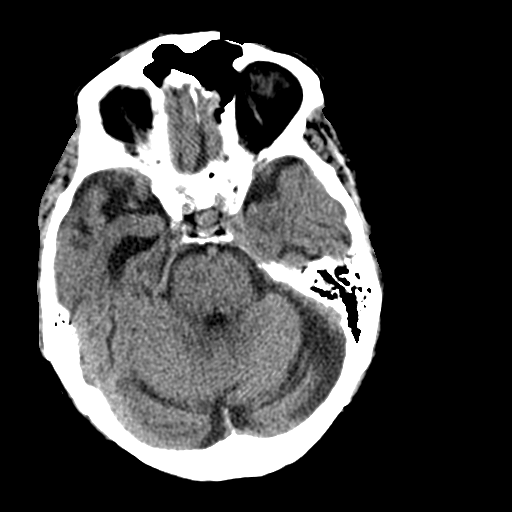
[im 13/32  brain]
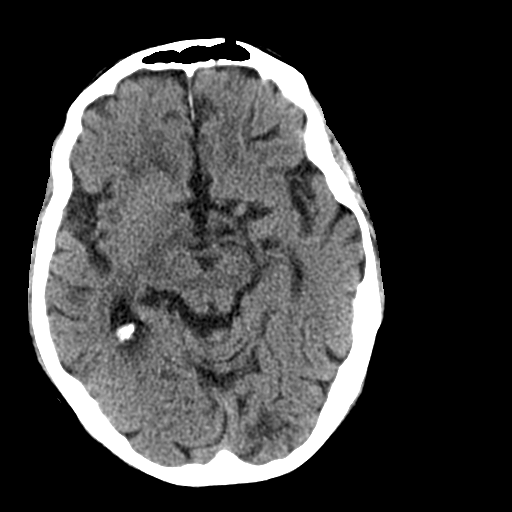
[im 16/32  brain]
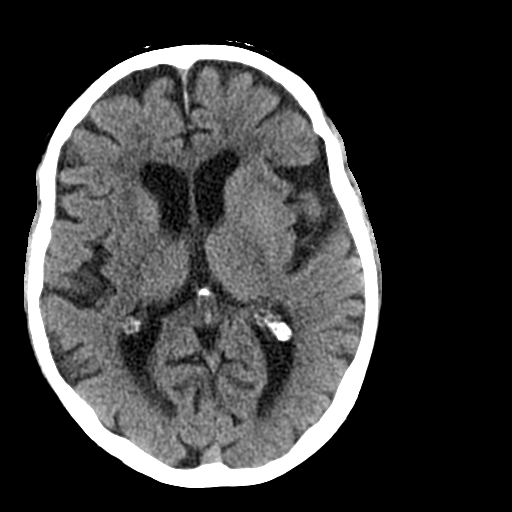
[im 16/32  bone]
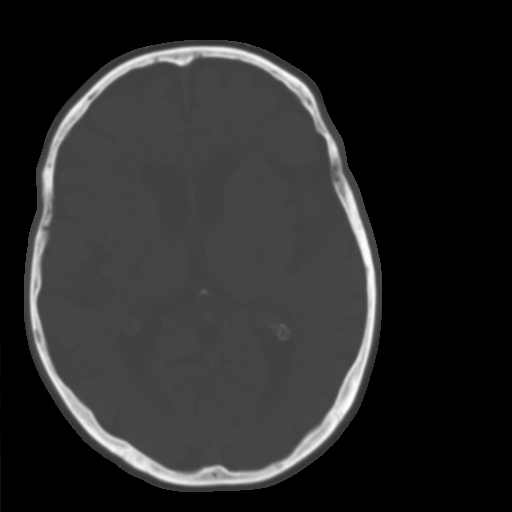
[im 19/32  brain]
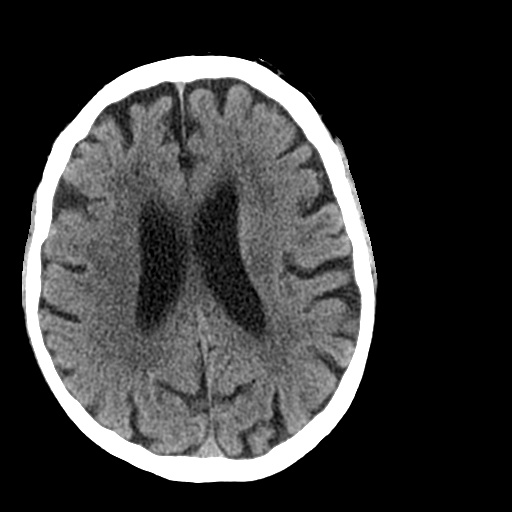
[im 22/32  brain]
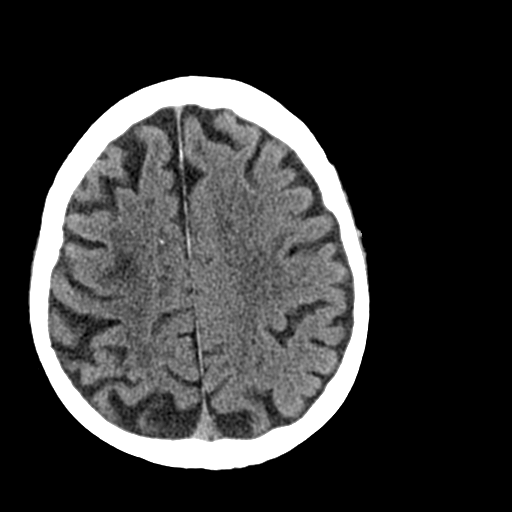
[im 25/32  brain]
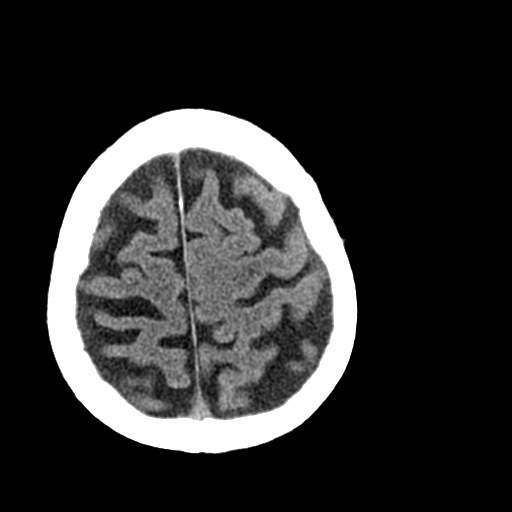
[im 28/32  brain]
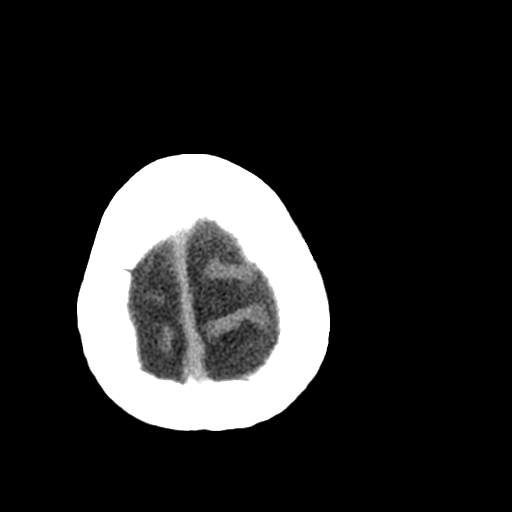
[im 28/32  bone]
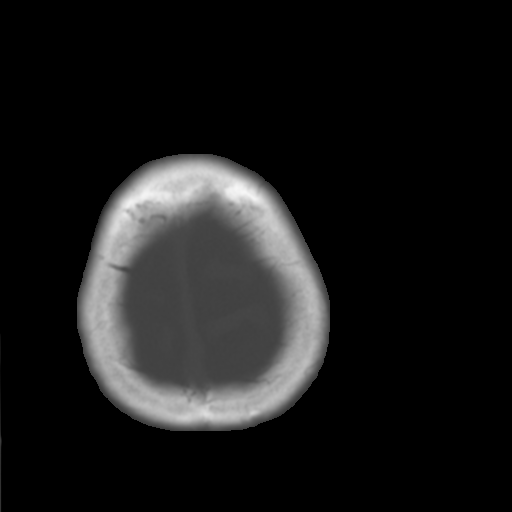

[Series 3: bone windows · axial · 0.42mm/px · z∈[-90,+18]mm · 7 of 54 slices shown]
[im 6/54  bone]
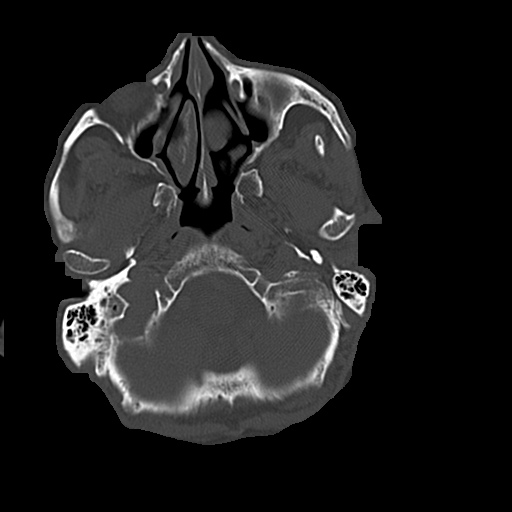
[im 12/54  bone]
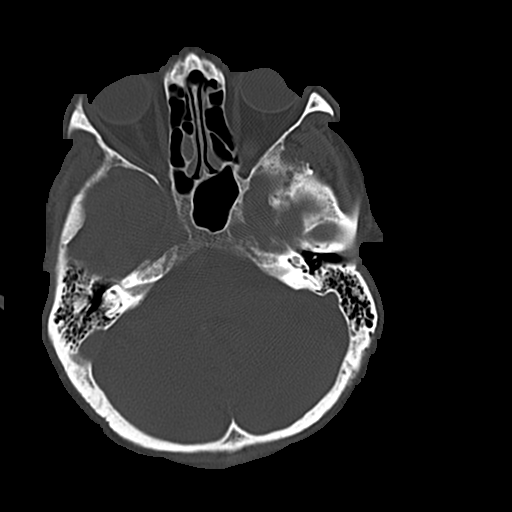
[im 18/54  bone]
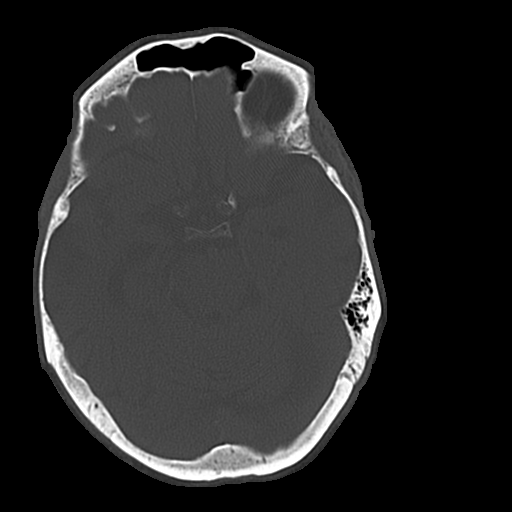
[im 24/54  bone]
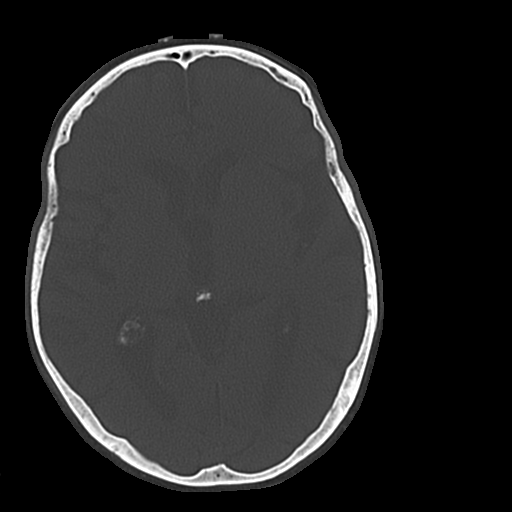
[im 30/54  bone]
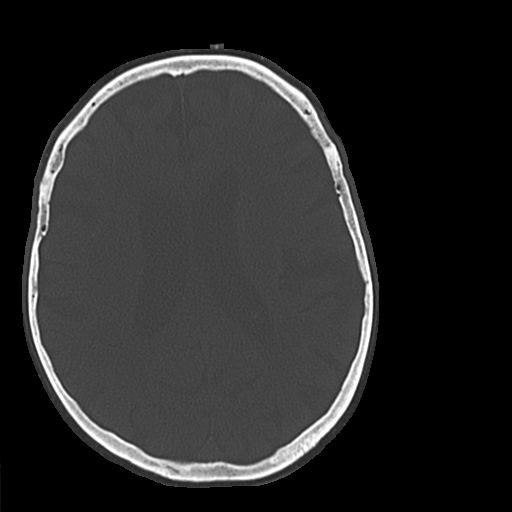
[im 36/54  bone]
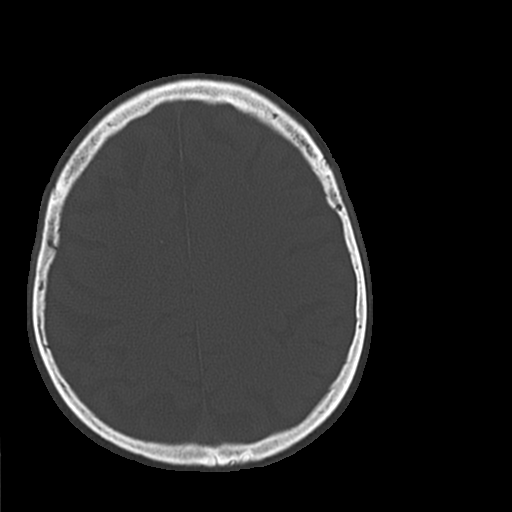
[im 42/54  bone]
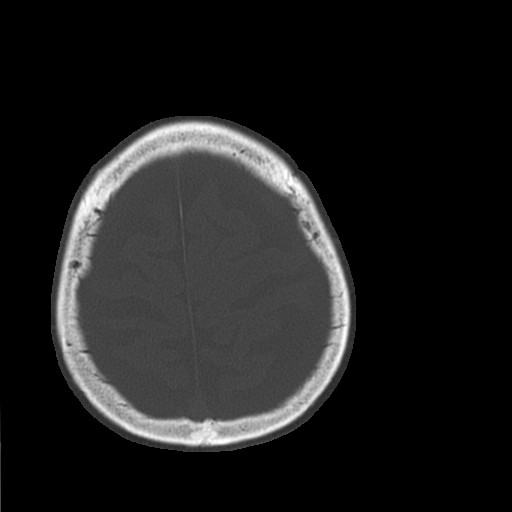

[16 of 30 positions shown; findings below may reference images not displayed]

FINDINGS: There is central and cortical atrophy. Periventricular white matter
changes are consistent with small vessel disease. Brown been
interval progression of white matter ischemia in the right posterior
frontal lobe. There is no intra or extra-axial fluid collection or
mass lesion. The basilar cisterns and ventricles have a normal
appearance. There is no CT evidence for acute infarction or
hemorrhage. There is mucoperiosteal thickening within the paranasal
sinuses.
IMPRESSION: 1. Atrophy and small vessel disease.
2. Progression of white matter ischemic changes in the right frontal
lobe.
3.  No evidence for acute intracranial abnormality.

## 2016-11-14 ENCOUNTER — Encounter: Payer: Commercial Managed Care - HMO | Admitting: Cardiovascular Disease

## 2016-11-14 ENCOUNTER — Encounter: Payer: Self-pay | Admitting: *Deleted

## 2016-11-20 ENCOUNTER — Other Ambulatory Visit: Payer: Self-pay | Admitting: Family Medicine

## 2016-11-21 ENCOUNTER — Other Ambulatory Visit: Payer: Self-pay

## 2016-11-21 MED ORDER — CARVEDILOL 6.25 MG PO TABS: 6.2500 mg | ORAL_TABLET | Freq: Two times a day (BID) | ORAL | 2 refills | Status: DC

## 2016-11-21 MED ORDER — POTASSIUM CHLORIDE CRYS ER 10 MEQ PO TBCR: 10.0000 meq | EXTENDED_RELEASE_TABLET | Freq: Every day | ORAL | 2 refills | Status: DC

## 2016-11-22 NOTE — Telephone Encounter (Signed)
Pt request refill  traMADol (ULTRAM) 50 MG tablet

## 2016-11-22 NOTE — Telephone Encounter (Signed)
Last OV 10-27-2016 Last refill 10/11/2016 #60, 1 rf Please advise

## 2016-11-23 ENCOUNTER — Other Ambulatory Visit: Payer: Self-pay

## 2016-11-23 MED ORDER — OMEPRAZOLE 20 MG PO CPDR: 20.0000 mg | DELAYED_RELEASE_CAPSULE | Freq: Every day | ORAL | 3 refills | Status: DC | PRN

## 2016-11-23 NOTE — Telephone Encounter (Signed)
Daughter following up on refill request Tramadol  [facility] [address]

## 2016-11-24 ENCOUNTER — Other Ambulatory Visit: Payer: Self-pay

## 2016-11-24 MED ORDER — OMEPRAZOLE 20 MG PO CPDR: 20.0000 mg | DELAYED_RELEASE_CAPSULE | Freq: Every day | ORAL | 3 refills | Status: DC | PRN

## 2016-11-24 NOTE — Telephone Encounter (Signed)
OK to refill

## 2016-11-24 NOTE — Telephone Encounter (Signed)
Daughter states dr Elease Hashimoto told pt he could take 1 1/2 Tabs and now pt is out. Pt really needs rx.

## 2016-12-04 ENCOUNTER — Ambulatory Visit (INDEPENDENT_AMBULATORY_CARE_PROVIDER_SITE_OTHER): Payer: Commercial Managed Care - HMO | Admitting: Family Medicine

## 2016-12-04 ENCOUNTER — Encounter: Payer: Self-pay | Admitting: Family Medicine

## 2016-12-04 VITALS — BP 100/70 | HR 43 | Ht 70.0 in | Wt 142.8 lb

## 2016-12-04 DIAGNOSIS — R0781 Pleurodynia: Secondary | ICD-10-CM | POA: Diagnosis not present

## 2016-12-04 DIAGNOSIS — I5042 Chronic combined systolic (congestive) and diastolic (congestive) heart failure: Secondary | ICD-10-CM | POA: Diagnosis not present

## 2016-12-04 DIAGNOSIS — R634 Abnormal weight loss: Secondary | ICD-10-CM

## 2016-12-04 MED ORDER — TRAMADOL HCL 50 MG PO TABS: 50.0000 mg | ORAL_TABLET | Freq: Four times a day (QID) | ORAL | 3 refills | Status: DC | PRN

## 2016-12-04 NOTE — Progress Notes (Signed)
Subjective:     Patient ID: Micheal Nicholson, male   DOB: [birth year], 82 y.o.   MRN: [id number]  HPI Patient is a 82 year old male who is coming in with son today with multiple nonspecific complaints. He stated initially that he "hurts all over ". More specifically, his had some recent hip pains and is followed by orthopedics for that and has received some recent injections. He has more recently has some lower rib cage pain initially the right side and now the left side. He has had some progressive weight loss over the past few months. Son states he is taking boost 3 times daily and eating fairly regularly. He had CT of the chest way back in March 2017 which showed some lymphadenopathy with consideration for possible lymphoma. We had detailed discussions with patient and son at that point and they declined further evaluation. Has been very debilitated often unknown during the past year. They have not noted any obvious lymphadenopathy.  Son feels that he cannot undergo biopsy or any specific treatments even if he had malignancy at this time.  No recent cough. No dyspnea at rest. No fevers or chills. No night sweats.  Denies chest pain. No dyspnea. Still walking and saying fairly active. Taking tramadol for pain generally one half tablets twice daily. Occasional constipation.  Other chronic problems include history of systolic heart failure, CAD, history of CVA, hypertension, history of atrial fibrillation, chronic kidney disease, BPH, chronic anxiety  Son has durable medical power of attorney and patient has a living will  Past Medical History:  Diagnosis Date  . Anxiety   . Arthritis   . BPH (benign prostatic hyperplasia)   . CHF (congestive heart failure) ([place])   . Chronic combined systolic and diastolic CHF, NYHA class 2 ([place]) 04/17/2015  . GERD (gastroesophageal reflux disease)   . Hypertension   . Stroke ([place])   . Symptomatic sinus bradycardia    PPM Dr. Rayann Heman, 01/13/16, STJ device    Past Surgical History:  Procedure Laterality Date  . CARDIAC CATHETERIZATION  2006   Negative / 4 years ago  . EP IMPLANTABLE DEVICE N/A 01/13/2016   Procedure: Pacemaker Implant;  Surgeon: Thompson Grayer, MD;  Location: [place] [facility];  Service: Cardiovascular;  Laterality: N/A;    reports that he has quit smoking. He has a 2.50 pack-year smoking history. He has never used smokeless tobacco. He reports that he drinks about 0.6 oz of alcohol per week . He reports that he does not use drugs. family history includes Healthy in his son; Heart disease in his brother; Heart failure in his son; Lung cancer in his brother; Pancreatic cancer in his son; Pneumonia (age of onset: 66) in his father. Allergies  Allergen Reactions  . Chocolate Other (See Comments)    Reaction:  Acid reflux    This SmartLink has not been configured with any valid records.   Wt Readings from Last 3 Encounters:  12/04/16 142 lb 12.8 oz (64.8 kg)  10/27/16 149 lb 9.6 oz (67.9 kg)  10/11/16 156 lb 9.6 oz (71 kg)     Review of Systems  Constitutional: Positive for appetite change and unexpected weight change. Negative for chills and fever.  HENT: Negative for trouble swallowing.   Respiratory: Negative for cough, shortness of breath and wheezing.   Cardiovascular: Positive for leg swelling. Negative for chest pain and palpitations.  Gastrointestinal: Positive for constipation. Negative for abdominal pain, diarrhea, nausea and vomiting.  Genitourinary: Negative for  dysuria and hematuria.  Musculoskeletal: Positive for arthralgias and back pain.  Neurological: Negative for dizziness and headaches.  Psychiatric/Behavioral: Negative for agitation.       Objective:   Physical Exam  Constitutional: He appears well-developed and well-nourished.  HENT:  Very poor dentition but no evidence for any gum abscess  Neck: Neck supple.  Cardiovascular: Normal rate and regular rhythm.   Pulmonary/Chest: Effort normal and  breath sounds normal.  Abdominal: Soft. Bowel sounds are normal. He exhibits no distension and no mass. There is no tenderness. There is no rebound and no guarding.  Musculoskeletal: He exhibits edema.  Trace edema ankles and lower legs bilaterally  Lymphadenopathy:    He has no cervical adenopathy.  Neurological: He is alert.  Psychiatric: He has a normal mood and affect.       Assessment:     #1 progressive weight loss. Question of lymphoma by previous CT scan March 2017  #2 somewhat migratory nondescript pain  #3 history of osteoarthritis involving multiple joints  #4 history of CAD  #5 history of systolic heart failure    Plan:     -Long discussion with patient and son regarding whether to further evaluation of his weight loss. Son has the perspective that he would not be very tolerant of biopsies or any aggressive intervention even if he had malignancy and I think this judgment makes a lot of sense. They're not interested in pursuing further diagnostics at this point -We discussed healthy dietary supplements. Continue nutrition and calorie supplements such as boost -Refill tramadol to take 1 tablet every 6 hours as needed for pain #90 with 2 refills -Use assistive device such as cane or walker at all times to reduce fall risk -Routine follow-up within 3 months to reassess and sooner as needed -We discussed the appropriate goal of comfort care as a top priority at this point-and they agree that is their top priority.  [name] MD [facility] at [facility]

## 2016-12-20 ENCOUNTER — Telehealth: Payer: Self-pay | Admitting: Family Medicine

## 2016-12-20 ENCOUNTER — Ambulatory Visit (INDEPENDENT_AMBULATORY_CARE_PROVIDER_SITE_OTHER): Payer: Commercial Managed Care - HMO | Admitting: Podiatry

## 2016-12-20 ENCOUNTER — Encounter: Payer: Self-pay | Admitting: Podiatry

## 2016-12-20 VITALS — Ht 69.0 in | Wt 140.0 lb

## 2016-12-20 DIAGNOSIS — M79676 Pain in unspecified toe(s): Secondary | ICD-10-CM | POA: Diagnosis not present

## 2016-12-20 DIAGNOSIS — G629 Polyneuropathy, unspecified: Secondary | ICD-10-CM

## 2016-12-20 DIAGNOSIS — B351 Tinea unguium: Secondary | ICD-10-CM

## 2016-12-20 NOTE — Telephone Encounter (Signed)
Son states pt has been experiencing restless leg again and would like a refill of the] pramipexole (MIRAPEX) tablet 0.125 mg  Pt is anxious and up and down. When he Is laying tin the bed, one leg will continue to "kick"  [facility] [address]

## 2016-12-20 NOTE — Progress Notes (Signed)
   Subjective:    Patient ID: Micheal Nicholson, male    DOB: 12-15-1920, 82 y.o.   MRN: [id number]  HPI this patient presents to the office with chief complaint of long thick nails. Patient states nails are painful walking and wearing his shoes. His son relates that he has a severe case of restless leg syndrome. He has also been diagnosed with peripheral neuropathic pain as well as kidney disease. He presents the office today for an evaluation and treatment of his painful nails    Review of Systems  All other systems reviewed and are negative.      Objective:   Physical Exam GENERAL APPEARANCE: Alert, conversant. Appropriately groomed. No acute distress.  VASCULAR: Pedal pulses are not  palpable at  [facility] and PT bilateral.  Capillary refill time is immediate to all digits, .  Cold feet noted. NEUROLOGIC: sensation is diminished  to 5.07 monofilament at 5/5 sites bilateral.  Light touch is intact bilateral, Muscle strength normal.  MUSCULOSKELETAL: acceptable muscle strength, tone and stability bilateral.  Intrinsic muscluature intact bilateral.  Rectus appearance of foot and digits noted bilateral.   DERMATOLOGIC: skin color, texture, and turgor are within normal limits.  No preulcerative lesions or ulcers  are seen, no interdigital maceration noted.  No open lesions present.   No drainage noted.  NAILS  Thick disfigured discolored nails both feet.        Assessment & Plan:  Onychomycosis  B/L  Neuropathy and RLS  IE  Debridement of nails both feet.  RTC 12 weeks.   Gardiner Barefoot DPM

## 2016-12-20 NOTE — Telephone Encounter (Signed)
May refill and would start off with just one po qhs #30 with one refill. Make sure he has follow up within 2 months to reassess

## 2016-12-20 NOTE — Telephone Encounter (Signed)
This was D/C off pts medication list in October 2017. Please review and advise. Thanks.

## 2016-12-21 ENCOUNTER — Other Ambulatory Visit: Payer: Self-pay | Admitting: Family Medicine

## 2016-12-21 ENCOUNTER — Other Ambulatory Visit: Payer: Self-pay | Admitting: Emergency Medicine

## 2016-12-21 MED ORDER — PRAMIPEXOLE DIHYDROCHLORIDE 0.125 MG PO TABS: 0.1250 mg | ORAL_TABLET | Freq: Every day | ORAL | 1 refills | Status: DC

## 2016-12-21 NOTE — Telephone Encounter (Signed)
Last filled 08/28/16 #30 refills 0  Last OV 04/21/16   Okay to refill?

## 2016-12-21 NOTE — Telephone Encounter (Signed)
Called and spoke with pt's son informing him that prescription has been called into pharmacy also that a 2 month follow uo appointment would be needed. Understanding verbalized nothing further needed at this time.

## 2016-12-26 NOTE — Telephone Encounter (Signed)
He should be getting #60 and 5 refills.

## 2017-01-11 ENCOUNTER — Other Ambulatory Visit: Payer: Self-pay | Admitting: Neurology

## 2017-01-11 DIAGNOSIS — I4819 Other persistent atrial fibrillation: Secondary | ICD-10-CM

## 2017-01-11 DIAGNOSIS — I6340 Cerebral infarction due to embolism of unspecified cerebral artery: Secondary | ICD-10-CM

## 2017-02-01 ENCOUNTER — Emergency Department (HOSPITAL_COMMUNITY): Payer: Medicare HMO

## 2017-02-01 ENCOUNTER — Emergency Department (HOSPITAL_COMMUNITY)
Admission: EM | Admit: 2017-02-01 | Discharge: 2017-02-01 | Disposition: A | Discharge status: Home / Self Care | Payer: Medicare HMO | Attending: Emergency Medicine | Admitting: Emergency Medicine

## 2017-02-01 ENCOUNTER — Encounter (HOSPITAL_COMMUNITY): Payer: Self-pay

## 2017-02-01 DIAGNOSIS — Y92481 Parking lot as the place of occurrence of the external cause: Secondary | ICD-10-CM | POA: Diagnosis not present

## 2017-02-01 DIAGNOSIS — Z87891 Personal history of nicotine dependence: Secondary | ICD-10-CM | POA: Diagnosis not present

## 2017-02-01 DIAGNOSIS — M25552 Pain in left hip: Secondary | ICD-10-CM | POA: Diagnosis not present

## 2017-02-01 DIAGNOSIS — Y999 Unspecified external cause status: Secondary | ICD-10-CM | POA: Diagnosis not present

## 2017-02-01 DIAGNOSIS — S299XXA Unspecified injury of thorax, initial encounter: Secondary | ICD-10-CM | POA: Diagnosis not present

## 2017-02-01 DIAGNOSIS — W0110XA Fall on same level from slipping, tripping and stumbling with subsequent striking against unspecified object, initial encounter: Secondary | ICD-10-CM | POA: Insufficient documentation

## 2017-02-01 DIAGNOSIS — Z79899 Other long term (current) drug therapy: Secondary | ICD-10-CM | POA: Diagnosis not present

## 2017-02-01 DIAGNOSIS — S0990XA Unspecified injury of head, initial encounter: Secondary | ICD-10-CM | POA: Insufficient documentation

## 2017-02-01 DIAGNOSIS — N183 Chronic kidney disease, stage 3 (moderate): Secondary | ICD-10-CM | POA: Diagnosis not present

## 2017-02-01 DIAGNOSIS — M545 Low back pain: Secondary | ICD-10-CM | POA: Diagnosis not present

## 2017-02-01 DIAGNOSIS — Z8673 Personal history of transient ischemic attack (TIA), and cerebral infarction without residual deficits: Secondary | ICD-10-CM | POA: Diagnosis not present

## 2017-02-01 DIAGNOSIS — S098XXA Other specified injuries of head, initial encounter: Secondary | ICD-10-CM | POA: Diagnosis not present

## 2017-02-01 DIAGNOSIS — S199XXA Unspecified injury of neck, initial encounter: Secondary | ICD-10-CM | POA: Diagnosis not present

## 2017-02-01 DIAGNOSIS — W19XXXA Unspecified fall, initial encounter: Secondary | ICD-10-CM

## 2017-02-01 DIAGNOSIS — S79912A Unspecified injury of left hip, initial encounter: Secondary | ICD-10-CM | POA: Diagnosis not present

## 2017-02-01 DIAGNOSIS — I5042 Chronic combined systolic (congestive) and diastolic (congestive) heart failure: Secondary | ICD-10-CM | POA: Insufficient documentation

## 2017-02-01 DIAGNOSIS — Y9301 Activity, walking, marching and hiking: Secondary | ICD-10-CM | POA: Diagnosis not present

## 2017-02-01 DIAGNOSIS — I2510 Atherosclerotic heart disease of native coronary artery without angina pectoris: Secondary | ICD-10-CM | POA: Insufficient documentation

## 2017-02-01 DIAGNOSIS — S0181XA Laceration without foreign body of other part of head, initial encounter: Secondary | ICD-10-CM | POA: Diagnosis not present

## 2017-02-01 DIAGNOSIS — Z7901 Long term (current) use of anticoagulants: Secondary | ICD-10-CM | POA: Insufficient documentation

## 2017-02-01 DIAGNOSIS — I130 Hypertensive heart and chronic kidney disease with heart failure and stage 1 through stage 4 chronic kidney disease, or unspecified chronic kidney disease: Secondary | ICD-10-CM | POA: Insufficient documentation

## 2017-02-01 LAB — CBC WITH DIFFERENTIAL/PLATELET
BASOS PCT: 0 %
Basophils Absolute: 0 10*3/uL (ref 0.0–0.1)
EOS PCT: 3 %
Eosinophils Absolute: 0.1 10*3/uL (ref 0.0–0.7)
HCT: 31.3 % — ABNORMAL LOW (ref 39.0–52.0)
Hemoglobin: 10.2 g/dL — ABNORMAL LOW (ref 13.0–17.0)
Lymphocytes Relative: 15 %
Lymphs Abs: 0.7 10*3/uL (ref 0.7–4.0)
MCH: 29.6 pg (ref 26.0–34.0)
MCHC: 32.6 g/dL (ref 30.0–36.0)
MCV: 90.7 fL (ref 78.0–100.0)
MONO ABS: 0.4 10*3/uL (ref 0.1–1.0)
Monocytes Relative: 8 %
Neutro Abs: 3.3 10*3/uL (ref 1.7–7.7)
Neutrophils Relative %: 74 %
PLATELETS: 154 10*3/uL (ref 150–400)
RBC: 3.45 MIL/uL — ABNORMAL LOW (ref 4.22–5.81)
RDW: 14 % (ref 11.5–15.5)
WBC: 4.4 10*3/uL (ref 4.0–10.5)

## 2017-02-01 LAB — URINALYSIS, ROUTINE W REFLEX MICROSCOPIC
Bilirubin Urine: NEGATIVE
GLUCOSE, UA: NEGATIVE mg/dL
HGB URINE DIPSTICK: NEGATIVE
Ketones, ur: NEGATIVE mg/dL
Leukocytes, UA: NEGATIVE
Nitrite: NEGATIVE
Protein, ur: NEGATIVE mg/dL
SPECIFIC GRAVITY, URINE: 1.008 (ref 1.005–1.030)
pH: 7 (ref 5.0–8.0)

## 2017-02-01 LAB — COMPREHENSIVE METABOLIC PANEL
ALBUMIN: 3.6 g/dL (ref 3.5–5.0)
ALT: 19 U/L (ref 17–63)
AST: 20 U/L (ref 15–41)
Alkaline Phosphatase: 52 U/L (ref 38–126)
Anion gap: 13 (ref 5–15)
BUN: 34 mg/dL — AB (ref 6–20)
CHLORIDE: 101 mmol/L (ref 101–111)
CO2: 26 mmol/L (ref 22–32)
Calcium: 9 mg/dL (ref 8.9–10.3)
Creatinine, Ser: 1.79 mg/dL — ABNORMAL HIGH (ref 0.61–1.24)
GFR calc Af Amer: 36 mL/min — ABNORMAL LOW (ref >60)
GFR calc non Af Amer: 31 mL/min — ABNORMAL LOW (ref >60)
GLUCOSE: 110 mg/dL — AB (ref 65–99)
POTASSIUM: 4.1 mmol/L (ref 3.5–5.1)
SODIUM: 140 mmol/L (ref 135–145)
Total Bilirubin: 1.1 mg/dL (ref 0.3–1.2)
Total Protein: 6.3 g/dL — ABNORMAL LOW (ref 6.5–8.1)

## 2017-02-01 MED ORDER — FENTANYL CITRATE (PF) 100 MCG/2ML IJ SOLN
50.0000 ug | Freq: Once | INTRAMUSCULAR | Status: AC
  Administered 2017-02-01: 50 ug via INTRAVENOUS
  Filled 2017-02-01: qty 2

## 2017-02-01 MED ORDER — ALPRAZOLAM 0.25 MG PO TABS
0.5000 mg | ORAL_TABLET | Freq: Once | ORAL | Status: AC
  Administered 2017-02-01: 0.5 mg via ORAL
  Filled 2017-02-01: qty 2

## 2017-02-01 MED ORDER — FUROSEMIDE 10 MG/ML IJ SOLN
40.0000 mg | Freq: Once | INTRAMUSCULAR | Status: AC
  Administered 2017-02-01: 40 mg via INTRAVENOUS
  Filled 2017-02-01: qty 4

## 2017-02-01 NOTE — ED Notes (Signed)
Updated son (gene) with pt. Permission.

## 2017-02-01 NOTE — ED Triage Notes (Signed)
Pt lives at [facility] in the independent living. Pt was driving to get his hair cut and when he got there tripped in the parking lot. Pt fell and hit the back of his head. Pt is on elequis. Pt only c/o is back pain. Per EMS pt has became increasingly confused since they arrived. Pt has a history of a stroke and has a delay in speech from that.

## 2017-02-01 NOTE — ED Provider Notes (Signed)
[facility] DEPT Provider Note   CSN: [id number] Arrival date & time: 02/01/17  1230     History   Chief Complaint Chief Complaint  Patient presents with  . Fall    HPI Micheal Nicholson is a 83 y.o. male.  HPI 83 year old male who presents after mechanical fall. Arrives from [facility] independent living. He has a history of paroxysmal atrial fibrillation on Eliquis, prior CVA, chronic systolic heart failure, and with pacemaker. States that he was walking in the parking lot and stepped on a curb, and felt as if he was about to lose balance. He attempted to step back, but tripped and fell onto his back and hit his head. He did not have loss of consciousness. States that he has otherwise been in his usual state of health without recent illnesses including fevers, cough, vomiting or diarrhea, or urinary complaints.   He complains of low back pain. Complaint of spasms in the left lower extremity and left hip pain. Denies any chest pain, difficulty breathing, abdominal pain, numbness or weakness.    Past Medical History:  Diagnosis Date  . Anxiety   . Arthritis   . BPH (benign prostatic hyperplasia)   . CHF (congestive heart failure) ([place])   . Chronic combined systolic and diastolic CHF, NYHA class 2 ([place]) 04/17/2015  . GERD (gastroesophageal reflux disease)   . Hypertension   . Stroke ([place])   . Symptomatic sinus bradycardia    PPM Dr. Rayann Heman, 01/13/16, STJ device    Patient Active Problem List   Diagnosis Date Noted  . Acute on chronic systolic CHF (congestive heart failure) ([place]) 10/08/2016  . Acute respiratory failure with hypoxia ([place]) 10/08/2016  . CKD (chronic kidney disease), stage III 10/08/2016  . Sacral fracture, closed ([place]) 10/08/2016  . Hypoxia   . Hypokalemia 08/24/2016  . Community acquired pneumonia 08/23/2016  . Pruritus 08/09/2016  . Chronic anticoagulation 04/28/2016  . Cardiac pacemaker in situ 04/28/2016  . Chronic systolic congestive heart  failure ([place]) 04/28/2016  . Left hemiparesis ([place]) 04/25/2016  . Encounter for therapeutic drug monitoring 04/19/2016  . Cardiomyopathy, ischemic   . Cerebral embolism with cerebral infarction 03/03/2016  . Normocytic anemia   . Thrombocytopenia ([place])   . Acute left hemiparesis ([place])   . Peripheral neuropathic pain   . Hemiplegia ([place]) 03/02/2016  . Adenopathy   . Long term current use of anticoagulant therapy 01/23/2016  . CAD (coronary artery disease) 01/23/2016  . Second degree Mobitz II AV block 01/13/2016  . Chronic systolic CHF (congestive heart failure) ([place])   . Persistent atrial fibrillation ([place])   . Atrial fibrillation with slow ventricular response ([place])   . AKI (acute kidney injury) ([place])   . Bradycardia 12/29/2015  . Acute renal failure (ARF) ([place]) 12/29/2015  . Hyponatremia 12/29/2015  . Hypotension 12/29/2015  . Elevated troponin 12/29/2015  . Acute upper respiratory infection 12/23/2015  . Chronic combined systolic and diastolic CHF, NYHA class 2 ([place]) 04/17/2015  . Vitamin D deficiency 03/11/2015  . Cardiomyopathy -etiology uncertain [id number]  . Paroxysmal atrial fibrillation ([place]) 08/27/2014  . 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 H/O: stroke 08/27/2014  . GERD (gastroesophageal reflux disease) 02/07/2011  . BPH (benign prostatic hypertrophy) 07/12/2010  . Anxiety state 11/02/2009  . Essential hypertension 11/02/2009    Past Surgical History:  Procedure Laterality Date  . CARDIAC CATHETERIZATION  2006   Negative / 4 years ago  . EP IMPLANTABLE DEVICE N/A 01/13/2016   Procedure: Pacemaker Implant;  Surgeon: Jeneen Rinks  Allred, MD;  Location: [facility];  Service: Cardiovascular;  Laterality: N/A;       Home Medications    Prior to Admission medications   Medication Sig Start Date End Date Taking? Authorizing Provider  ALPRAZolam (XANAX) 0.5 MG tablet TAKE 1 TABLET BY MOUTH TWICE A DAY AS NEEDED FOR ANXIETY 12/26/16   [name], MD  bisacodyl  (DULCOLAX) 5 MG EC tablet Take 5 mg by mouth daily as needed for moderate constipation.    Historical Provider, MD  carvedilol (COREG) 6.25 MG tablet Take 1 tablet (6.25 mg total) by mouth 2 (two) times daily with a meal. 11/21/16   Mihai Croitoru, MD  Cholecalciferol (VITAMIN D PO) Take 1 tablet by mouth daily with breakfast.    Historical Provider, MD  ELIQUIS 5 MG TABS tablet Take 5 mg by mouth 2 (two) times daily. 10/01/16   Historical Provider, MD  ELIQUIS 5 MG TABS tablet TAKE 1 TABLET (5 MG TOTAL) BY MOUTH 2 (TWO) TIMES DAILY. 01/11/17   Rosalin Hawking, MD  fluticasone (FLONASE) 50 MCG/ACT nasal spray Place 2 sprays into both nostrils 2 (two) times daily as needed for rhinitis.     Historical Provider, MD  furosemide (LASIX) 40 MG tablet Take 1 tablet (40 mg total) by mouth daily. 08/18/16 11/16/16  Mihai Croitoru, MD  hydrocortisone (CORTIZONE-10) 1 % ointment Apply 1 application topically 2 (two) times daily as needed for itching.    Historical Provider, MD  lisinopril (PRINIVIL,ZESTRIL) 5 MG tablet Take 1 tablet (5 mg total) by mouth daily. 10/18/16   [name], MD  omeprazole (PRILOSEC) 20 MG capsule Take 1 capsule (20 mg total) by mouth daily as needed (for acid reflex). 11/24/16   [name], MD  potassium chloride (KLOR-CON M10) 10 MEQ tablet Take 1 tablet (10 mEq total) by mouth daily. 11/21/16   Mihai Croitoru, MD  pramipexole (MIRAPEX) 0.125 MG tablet Take 1 tablet (0.125 mg total) by mouth at bedtime. 12/21/16   [name], MD  senna-docusate (SENOKOT-S) 8.6-50 MG tablet Take 1 tablet by mouth at bedtime as needed for mild constipation. 03/06/16   Nishant Dhungel, MD  sertraline (ZOLOFT) 100 MG tablet Take 50 mg by mouth daily. Take one half tablet daily.    Historical Provider, MD  tamsulosin (FLOMAX) 0.4 MG CAPS capsule TAKE 1 CAPSULE (0.4 MG TOTAL) BY MOUTH DAILY. 11/08/16   [name], MD  traMADol (ULTRAM) 50 MG tablet Take 1 tablet (50 mg total) by mouth  every 6 (six) hours as needed. 12/04/16   [name], MD  Triamcinolone Acetonide (TRIAMCINOLONE 0.1 % CREAM : EUCERIN) CREA Apply 1 application topically 2 (two) times daily as needed for rash, itching or irritation.    Historical Provider, MD    Family History Family History  Problem Relation Age of [place]  . Pneumonia Father 54    died  . Lung cancer Brother   . Heart disease Brother   . Pancreatic cancer Son   . Healthy Son   . Heart failure Son     Social History Social History  Substance Use Topics  . Smoking status: Former Smoker    Packs/day: 0.50    Years: 5.00  . Smokeless tobacco: Never Used  . Alcohol use 0.6 oz/week    1 Shots of liquor per week     Comment: occ     Allergies   Chocolate   Review of Systems Review of Systems 10/14  systems reviewed and are negative other than those stated in the HPI   Physical Exam Updated Vital Signs BP 111/88   Pulse (!) 57   Resp 14   SpO2 99%   Physical Exam Physical Exam  Nursing note and vitals reviewed. Constitutional: Well developed, well nourished, non-toxic, and in no acute distress Head: Normocephalic and atraumatic.  Mouth/Throat: Oropharynx is clear and moist.  Neck: cervical collar in place Cardiovascular: Normal rate and regular rhythm.  +2 DP pulses Pulmonary/Chest: Effort normal and breath sounds normal. no chest wall tenderness Abdominal: Soft. There is no tenderness. There is no rebound and no guarding.  Musculoskeletal: Normal range of motion. Limited range of motion of left hip due to pain Neurological: Alert, no facial droop, fluent speech, moves all extremities symmetrically Skin: Skin is warm and dry.  Psychiatric: Cooperative   ED Treatments / Results  Labs (all labs ordered are listed, but only abnormal results are displayed) Labs Reviewed  CBC WITH DIFFERENTIAL/PLATELET - Abnormal; Notable for the following:       Result Value   RBC 3.45 (*)    Hemoglobin 10.2 (*)    HCT  31.3 (*)    All other components within normal limits  COMPREHENSIVE METABOLIC PANEL - Abnormal; Notable for the following:    Glucose, Bld 110 (*)    BUN 34 (*)    Creatinine, Ser 1.79 (*)    Total Protein 6.3 (*)    GFR calc non Af Amer 31 (*)    GFR calc Af Amer 36 (*)    All other components within normal limits  URINALYSIS, ROUTINE W REFLEX MICROSCOPIC    EKG  EKG Interpretation None       Radiology Dg Chest 1 View  Result Date: 02/01/2017 CLINICAL DATA:  Status post fall. On anticoagulants. History of cardiomyopathy, CHF, atrial fibrillation, former smoker. EXAM: CHEST 1 VIEW COMPARISON:  PA and lateral chest x-ray of October 08, 2016. FINDINGS: The lungs are well-expanded. The interstitial markings are chronically increased. The cardiac silhouette is enlarged but stable. The central pulmonary vascular vascularity is engorged but also stable. The ICD is in stable position. There is dense calcification in the wall of the thoracic aorta. There is S-shaped thoracolumbar scoliosis with multilevel degenerative disc disease. IMPRESSION: No acute post traumatic injury is observed. There is CHF with pulmonary interstitial edema and possible underlying pulmonary fibrosis. The appearance of the interstitium is not greatly changed since the previous study. Thoracic aortic atherosclerosis. Electronically Signed   By: David  Martinique M.D.   On: 02/01/2017 13:58   Dg Lumbar Spine Complete  Result Date: 02/01/2017 CLINICAL DATA:  Status post fall today. Back pain and jerking of the legs. EXAM: LUMBAR SPINE - COMPLETE 4+ VIEW COMPARISON:  Lumbar spine series of October 15, 2013 FINDINGS: There is curvature of the thoracolumbar spine convex toward the left centered at T11-T12. This is chronic. There is chronic multilevel degenerative disc space narrowing at all lumbar levels. No compression fracture or spondylolisthesis is observed. There is facet joint hypertrophy at all levels greatest at L4-5 and  L5-S1. Where visualized the sacrum is intact. There is calcification in the wall of the abdominal aorta. IMPRESSION: Multilevel degenerative disc disease without acute compression fracture or spondylolisthesis. Chronic levocurvature centered at the thoracolumbar junction. Abdominal aortic atherosclerosis. Electronically Signed   By: David  Martinique M.D.   On: 02/01/2017 14:06   Ct Head Wo Contrast  Result Date: 02/01/2017 CLINICAL DATA:  Status post fall today. The  patient is anticoagulated. EXAM: CT HEAD WITHOUT CONTRAST CT CERVICAL SPINE WITHOUT CONTRAST TECHNIQUE: Multidetector CT imaging of the head and cervical spine was performed following the standard protocol without intravenous contrast. Multiplanar CT image reconstructions of the cervical spine were also generated. COMPARISON:  Head and cervical spine CT scans 03/02/2016. FINDINGS: CT HEAD FINDINGS Brain: Atrophy and chronic microvascular ischemic change are identified. Remote deep white matter infarct in the right parietal lobe is seen and new since the prior exam. No evidence of acute abnormality including infarct, hemorrhage, mass lesion, mass effect, midline shift or abnormal extra-axial fluid collection. No hydrocephalus or pneumocephalus. Vascular: Negative. Skull: Intact. Sinuses/Orbits: No acute abnormality. Other: None. CT CERVICAL SPINE FINDINGS Alignment: Maintained. Skull base and vertebrae: No acute fracture. No primary bone lesion or focal pathologic process. 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 No fracture or focal bony lesion. Scattered facet degenerative disease is seen and worst on the right at C7-T1. Disc levels:  Mild degenerative disc disease is noted. Upper chest: Atherosclerotic vascular disease identified. There is some interlobular septal thickening in the upper lung zones as can be seen interstitial edema. Other: None. IMPRESSION: No acute abnormality head  or cervical spine. Atrophy and chronic microvascular ischemic change. Remote deep white matter infarct in the right parietal lobe is new since the prior CT scan. Mild cervical spondylosis. Findings suggestive of interstitial pulmonary edema. Electronically Signed   By: Inge Rise M.D.   On: 02/01/2017 13:52   Ct Cervical Spine Wo Contrast  Result Date: 02/01/2017 CLINICAL DATA:  Status post fall today. The patient is anticoagulated. EXAM: CT HEAD WITHOUT CONTRAST CT CERVICAL SPINE WITHOUT CONTRAST TECHNIQUE: Multidetector CT imaging of the head and cervical spine was performed following the standard protocol without intravenous contrast. Multiplanar CT image reconstructions of the cervical spine were also generated. COMPARISON:  Head and cervical spine CT scans 03/02/2016. FINDINGS: CT HEAD FINDINGS Brain: Atrophy and chronic microvascular ischemic change are identified. Remote deep white matter infarct in the right parietal lobe is seen and new since the prior exam. No evidence of acute abnormality including infarct, hemorrhage, mass lesion, mass effect, midline shift or abnormal extra-axial fluid collection. No hydrocephalus or pneumocephalus. Vascular: Negative. Skull: Intact. Sinuses/Orbits: No acute abnormality. Other: None. CT CERVICAL SPINE FINDINGS Alignment: Maintained. Skull base and vertebrae: No acute fracture. No primary bone lesion or focal pathologic process. 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 No fracture or focal bony lesion. Scattered facet degenerative disease is seen and worst on the right at C7-T1. Disc levels:  Mild degenerative disc disease is noted. Upper chest: Atherosclerotic vascular disease identified. There is some interlobular septal thickening in the upper lung zones as can be seen interstitial edema. Other: None. IMPRESSION: No acute abnormality head or cervical spine. Atrophy and chronic microvascular  ischemic change. Remote deep white matter infarct in the right parietal lobe is new since the prior CT scan. Mild cervical spondylosis. Findings suggestive of interstitial pulmonary edema. Electronically Signed   By: Inge Rise M.D.   On: 02/01/2017 13:52   Dg Hip Unilat W Or Wo Pelvis 2-3 Views Left  Result Date: 02/01/2017 CLINICAL DATA:  Status post fall today with persistent back pain and jerking of the legs. EXAM: DG HIP (WITH OR WITHOUT PELVIS) 2-3V LEFT COMPARISON:  Lumbar spine series of October 15, 2013. FINDINGS: The bones are subjectively  mildly osteopenic. No acute pelvic fracture is observed. AP and lateral views of the left hip reveal preservation of the joint space. The articular surfaces of the femoral head and acetabulum remains smoothly rounded. The femoral neck, intertrochanteric, and subtrochanteric regions of the left femur appear normal. There are degenerative changes of the lower lumbar spine. IMPRESSION: There is no acute bony abnormality of the left hip or visualized portions of the pelvis. Electronically Signed   By: David  Martinique M.D.   On: 02/01/2017 14:00    Procedures Procedures (including critical care time)  Medications Ordered in ED Medications  ALPRAZolam (XANAX) tablet 0.5 mg (not administered)  fentaNYL (SUBLIMAZE) injection 50 mcg (50 mcg Intravenous Given 02/01/17 1359)  furosemide (LASIX) injection 40 mg (40 mg Intravenous Given 02/01/17 1521)     Initial Impression / Assessment and Plan / ED Course  I have reviewed the triage vital signs and the nursing notes.  Pertinent labs & imaging results that were available during my care of the patient were reviewed by me and considered in my medical decision making (see chart for details).     History of CHF, PAF on Eliquis, and CVA who presents after mechanical fall. Mentating normally and fully oriented on my evaluation. Occasionally difficult to understand due to prior stroke per patient. Grossly neuro in  tact.   CT head and cervical spine visualized and w/o acute traumatic injury. XR hip, lumbar spine, and chest visualized without acute injury. CXR does show some interstitial edema. He has had pedal edema, but no chest pain, dyspnea, DOE, fatigue, orthopnea or PND. Will give 40 mg IV lasix in ED, but I do not feel he requires admission for this at this time.   Will ambulate with pulse ox and make sure oxygenating well and make sure no missed injuries. UA pending as well. Discussed with son that if work-up overall unremarkable with discharge. He will ensure patient will have close PCP follow-up for re-evaluation.   Final Clinical Impressions(s) / ED Diagnoses   Final diagnoses:  Fall, initial encounter    New Prescriptions New Prescriptions   No medications on file     Forde Dandy, MD 02/01/17 [phone]

## 2017-02-01 NOTE — ED Notes (Signed)
Ambulated pt on pulse ox, pt o2 started at 100% and dropped to 95%. Notified Dr. Laverta Baltimore.

## 2017-02-01 NOTE — Discharge Instructions (Signed)
Your scans and x-rays does not show any major traumatic injuries.  Your chest-xray does show a little fluid, which we gave you extra lasix. Resume your normal dose and follow-up closely with your primary care doctor for recheck.  Return without fail for worsening symptoms, including difficulty breathing, chest pain, confusion, or any other symptoms concerning to you.

## 2017-02-01 NOTE — ED Notes (Signed)
Ambulated pt to restroom, no issues. 

## 2017-02-01 NOTE — ED Provider Notes (Signed)
Blood pressure 140/91, pulse 64, resp. rate 16, SpO2 99 %.  Assuming care from Dr. Oleta Mouse.  In short, Micheal Nicholson is a 83 y.o. male with a chief complaint of Fall .  Refer to the original H&P for additional details.  The current plan of care is to ambulate patient .  05:30 PM Patient ambulated around the emergency department without difficulty. Oxygen level never got below 95% on RA. He received Lasix and will follow with his primary care physician tomorrow for repeat assessment. The patient and son are at bedside and her ear to return home. I discussed return precautions in detail. They feel safe and comfortable with the plan at discharge.   Nanda Quinton, MD    Margette Fast, MD 02/01/17 [phone]

## 2017-02-02 ENCOUNTER — Ambulatory Visit: Payer: Commercial Managed Care - HMO | Admitting: Neurology

## 2017-02-09 ENCOUNTER — Other Ambulatory Visit: Payer: Self-pay | Admitting: *Deleted

## 2017-02-09 MED ORDER — CARVEDILOL 6.25 MG PO TABS: 6.2500 mg | ORAL_TABLET | Freq: Two times a day (BID) | ORAL | 0 refills | Status: DC

## 2017-02-12 ENCOUNTER — Other Ambulatory Visit: Payer: Self-pay

## 2017-02-12 MED ORDER — ELIQUIS 5 MG PO TABS: 5.0000 mg | ORAL_TABLET | Freq: Two times a day (BID) | ORAL | 4 refills | Status: DC

## 2017-02-12 MED ORDER — SERTRALINE HCL 100 MG PO TABS: 50.0000 mg | ORAL_TABLET | Freq: Every day | ORAL | 1 refills | Status: DC

## 2017-02-26 ENCOUNTER — Telehealth: Payer: Self-pay

## 2017-02-26 ENCOUNTER — Ambulatory Visit: Payer: Commercial Managed Care - HMO | Admitting: Neurology

## 2017-02-26 NOTE — Telephone Encounter (Signed)
PATIENT WAS A NO SHOW FOR APPT TODAY. 

## 2017-02-27 ENCOUNTER — Encounter: Payer: Self-pay | Admitting: Neurology

## 2017-02-27 DIAGNOSIS — H353131 Nonexudative age-related macular degeneration, bilateral, early dry stage: Secondary | ICD-10-CM | POA: Diagnosis not present

## 2017-03-12 ENCOUNTER — Other Ambulatory Visit: Payer: Self-pay | Admitting: *Deleted

## 2017-03-12 MED ORDER — SERTRALINE HCL 100 MG PO TABS: 50.0000 mg | ORAL_TABLET | Freq: Every day | ORAL | 5 refills | Status: DC

## 2017-03-14 ENCOUNTER — Ambulatory Visit: Payer: Commercial Managed Care - HMO | Admitting: Podiatry

## 2017-03-14 ENCOUNTER — Other Ambulatory Visit: Payer: Self-pay | Admitting: *Deleted

## 2017-03-14 MED ORDER — CARVEDILOL 6.25 MG PO TABS: 6.2500 mg | ORAL_TABLET | Freq: Two times a day (BID) | ORAL | 2 refills | Status: DC

## 2017-03-26 ENCOUNTER — Ambulatory Visit (INDEPENDENT_AMBULATORY_CARE_PROVIDER_SITE_OTHER): Payer: Medicare HMO | Admitting: Family Medicine

## 2017-03-26 ENCOUNTER — Encounter: Payer: Self-pay | Admitting: Family Medicine

## 2017-03-26 VITALS — BP 110/70 | HR 62 | Temp 97.7°F | Wt 149.1 lb

## 2017-03-26 DIAGNOSIS — R0781 Pleurodynia: Secondary | ICD-10-CM | POA: Diagnosis not present

## 2017-03-26 DIAGNOSIS — Z9181 History of falling: Secondary | ICD-10-CM | POA: Diagnosis not present

## 2017-03-26 DIAGNOSIS — I10 Essential (primary) hypertension: Secondary | ICD-10-CM | POA: Diagnosis not present

## 2017-03-26 MED ORDER — TRAMADOL HCL 50 MG PO TABS: 50.0000 mg | ORAL_TABLET | Freq: Four times a day (QID) | ORAL | 3 refills | Status: DC | PRN

## 2017-03-26 NOTE — Progress Notes (Signed)
Pre visit review using our clinic review tool, if applicable. No additional management support is needed unless otherwise documented below in the visit note. 

## 2017-03-26 NOTE — Progress Notes (Signed)
Subjective:     Patient ID: Micheal Nicholson, male   DOB: [birth year], 83 y.o.   MRN: [id number]  HPI Patient has multiple chronic problems including history of second-degree Mobitz 2 AV block, atrial fibrillation, normocytic anemia, hyperlipidemia, history of CVA, history of GERD, hypertension, chronic kidney disease, chronic systolic heart failure, CAD who presents with chief complaint of left-sided pain for the past few days. Patient somewhat of a poor historian.  Location of pain is mostly left anterior rib region. Denies any injury. Very high risk for falls and has fallen multiple times the past but not recently. Ambulatory with cane. His son states he has gotten much stronger with ambulation c/w months ago. Patient has sensation of something "moving around" in his anterior left rib cage region. Symptoms are intermittent. No exacerbating or alleviating factors. No change of bowel habits. Denies pleuritic pain. No dyspnea. No cough. No fevers or chills. No appetite changes.  Takes low-dose tramadol as needed for arthritis pains. Requesting refill  Past Medical History:  Diagnosis Date  . Anxiety   . Arthritis   . BPH (benign prostatic hyperplasia)   . CHF (congestive heart failure) ([place])   . Chronic combined systolic and diastolic CHF, NYHA class 2 ([place]) 04/17/2015  . GERD (gastroesophageal reflux disease)   . Hypertension   . Stroke ([place])   . Symptomatic sinus bradycardia    PPM Dr. Rayann Heman, 01/13/16, STJ device   Past Surgical History:  Procedure Laterality Date  . CARDIAC CATHETERIZATION  2006   Negative / 4 years ago  . EP IMPLANTABLE DEVICE N/A 01/13/2016   Procedure: Pacemaker Implant;  Surgeon: Thompson Grayer, MD;  Location: [facility];  Service: Cardiovascular;  Laterality: N/A;    reports that he has quit smoking. He has a 2.50 pack-year smoking history. He has never used smokeless tobacco. He reports that he drinks about 0.6 oz of alcohol per week . He reports that he  does not use drugs. family history includes Healthy in his son; Heart disease in his brother; Heart failure in his son; Lung cancer in his brother; Pancreatic cancer in his son; Pneumonia (age of onset: 61) in his father. Allergies  Allergen Reactions  . Chocolate Other (See Comments)    Reaction:  Acid reflux      Review of Systems  Constitutional: Negative for chills and fever.  Respiratory: Negative for cough and shortness of breath.   Cardiovascular: Negative for chest pain and palpitations.  Gastrointestinal: Negative for abdominal pain, diarrhea, nausea and vomiting.  Genitourinary: Negative for dysuria.  Neurological: Negative for dizziness and weakness.  Hematological: Negative for adenopathy. Does not bruise/bleed easily.       Objective:   Physical Exam  Constitutional: He appears well-developed and well-nourished.  Cardiovascular: Normal rate and regular rhythm.   Pulmonary/Chest: Effort normal and breath sounds normal. No respiratory distress. He has no wheezes. He has no rales.  Abdominal: Soft. He exhibits no mass. There is no tenderness. There is no rebound and no guarding.  No splenomegaly or hepatomegaly noted. No ecchymosis  Musculoskeletal: He exhibits no edema.  No reproducible tenderness to palpation.  Neurological: He is alert.  Skin: No rash noted.       Assessment:     #1 acute left lower rib cage pain. Nonfocal exam. Etiology unclear. Denies associated recent injury. Pain is not reproducible. Patient in no distress and pain relatively mild  #2 hypertension stable and at goal  #3 high-risk for falls  Plan:     -Discussed possible x-rays with patient and his son and at this point since his pain is mild and only a few days duration we've elected to observe. He does not describe any pleuritic pain and suspect this is musculoskeletal -Consider chest x-ray and left rib films if pain persists -Follow-up immediately for any fever, chills, shortness  of breath -Refill tramadol for as needed use -Discussed possible physical therapy to reduce risk of falls and at this point they wish to observe  [name] MD [facility]

## 2017-03-26 NOTE — Patient Instructions (Signed)
Touch base in 1-2 weeks if pain no better Follow up sooner for any fever, cough, or increased shortness of breath.

## 2017-03-27 ENCOUNTER — Telehealth: Payer: Self-pay | Admitting: Family Medicine

## 2017-03-27 DIAGNOSIS — Z8673 Personal history of transient ischemic attack (TIA), and cerebral infarction without residual deficits: Secondary | ICD-10-CM

## 2017-03-27 DIAGNOSIS — G8194 Hemiplegia, unspecified affecting left nondominant side: Secondary | ICD-10-CM

## 2017-03-27 DIAGNOSIS — Z9181 History of falling: Secondary | ICD-10-CM

## 2017-03-27 NOTE — Telephone Encounter (Signed)
OK to set up 

## 2017-03-27 NOTE — Telephone Encounter (Signed)
° ° ° ° °  Dawn from [facility] home health call to say patient would like home health physical therapy. If this is ok she would like an order . She said they would pick up the order or she can  access the info in Epic. Would like a cal back   [phone]

## 2017-03-28 ENCOUNTER — Other Ambulatory Visit: Payer: Self-pay | Admitting: Family Medicine

## 2017-03-28 NOTE — Telephone Encounter (Signed)
Referral placed and Dawn is aware

## 2017-04-04 ENCOUNTER — Telehealth: Payer: Self-pay | Admitting: Family Medicine

## 2017-04-04 DIAGNOSIS — N183 Chronic kidney disease, stage 3 (moderate): Secondary | ICD-10-CM | POA: Diagnosis not present

## 2017-04-04 DIAGNOSIS — M1990 Unspecified osteoarthritis, unspecified site: Secondary | ICD-10-CM | POA: Diagnosis not present

## 2017-04-04 DIAGNOSIS — G8194 Hemiplegia, unspecified affecting left nondominant side: Secondary | ICD-10-CM | POA: Diagnosis not present

## 2017-04-04 DIAGNOSIS — F419 Anxiety disorder, unspecified: Secondary | ICD-10-CM | POA: Diagnosis not present

## 2017-04-04 DIAGNOSIS — I480 Paroxysmal atrial fibrillation: Secondary | ICD-10-CM | POA: Diagnosis not present

## 2017-04-04 DIAGNOSIS — I255 Ischemic cardiomyopathy: Secondary | ICD-10-CM | POA: Diagnosis not present

## 2017-04-04 DIAGNOSIS — I5042 Chronic combined systolic (congestive) and diastolic (congestive) heart failure: Secondary | ICD-10-CM | POA: Diagnosis not present

## 2017-04-04 DIAGNOSIS — I2510 Atherosclerotic heart disease of native coronary artery without angina pectoris: Secondary | ICD-10-CM | POA: Diagnosis not present

## 2017-04-04 DIAGNOSIS — I130 Hypertensive heart and chronic kidney disease with heart failure and stage 1 through stage 4 chronic kidney disease, or unspecified chronic kidney disease: Secondary | ICD-10-CM | POA: Diagnosis not present

## 2017-04-04 NOTE — Telephone Encounter (Signed)
OK 

## 2017-04-04 NOTE — Telephone Encounter (Signed)
Verbal order for physical therapy twice a week for 4 weeks and 1 OT evaluation.

## 2017-04-06 NOTE — Telephone Encounter (Signed)
Verbal orders given  

## 2017-04-10 ENCOUNTER — Other Ambulatory Visit: Payer: Self-pay | Admitting: Family Medicine

## 2017-04-12 DIAGNOSIS — N183 Chronic kidney disease, stage 3 (moderate): Secondary | ICD-10-CM | POA: Diagnosis not present

## 2017-04-12 DIAGNOSIS — I480 Paroxysmal atrial fibrillation: Secondary | ICD-10-CM | POA: Diagnosis not present

## 2017-04-12 DIAGNOSIS — G8194 Hemiplegia, unspecified affecting left nondominant side: Secondary | ICD-10-CM | POA: Diagnosis not present

## 2017-04-12 DIAGNOSIS — F419 Anxiety disorder, unspecified: Secondary | ICD-10-CM | POA: Diagnosis not present

## 2017-04-12 DIAGNOSIS — I130 Hypertensive heart and chronic kidney disease with heart failure and stage 1 through stage 4 chronic kidney disease, or unspecified chronic kidney disease: Secondary | ICD-10-CM | POA: Diagnosis not present

## 2017-04-12 DIAGNOSIS — I2510 Atherosclerotic heart disease of native coronary artery without angina pectoris: Secondary | ICD-10-CM | POA: Diagnosis not present

## 2017-04-12 DIAGNOSIS — I5042 Chronic combined systolic (congestive) and diastolic (congestive) heart failure: Secondary | ICD-10-CM | POA: Diagnosis not present

## 2017-04-12 DIAGNOSIS — I255 Ischemic cardiomyopathy: Secondary | ICD-10-CM | POA: Diagnosis not present

## 2017-04-12 DIAGNOSIS — M1990 Unspecified osteoarthritis, unspecified site: Secondary | ICD-10-CM | POA: Diagnosis not present

## 2017-04-13 ENCOUNTER — Other Ambulatory Visit: Payer: Self-pay | Admitting: Family Medicine

## 2017-04-13 DIAGNOSIS — N183 Chronic kidney disease, stage 3 (moderate): Secondary | ICD-10-CM | POA: Diagnosis not present

## 2017-04-13 DIAGNOSIS — I5042 Chronic combined systolic (congestive) and diastolic (congestive) heart failure: Secondary | ICD-10-CM | POA: Diagnosis not present

## 2017-04-13 DIAGNOSIS — I2510 Atherosclerotic heart disease of native coronary artery without angina pectoris: Secondary | ICD-10-CM | POA: Diagnosis not present

## 2017-04-13 DIAGNOSIS — F419 Anxiety disorder, unspecified: Secondary | ICD-10-CM | POA: Diagnosis not present

## 2017-04-13 DIAGNOSIS — M1990 Unspecified osteoarthritis, unspecified site: Secondary | ICD-10-CM | POA: Diagnosis not present

## 2017-04-13 DIAGNOSIS — G8194 Hemiplegia, unspecified affecting left nondominant side: Secondary | ICD-10-CM | POA: Diagnosis not present

## 2017-04-13 DIAGNOSIS — I480 Paroxysmal atrial fibrillation: Secondary | ICD-10-CM | POA: Diagnosis not present

## 2017-04-13 DIAGNOSIS — I255 Ischemic cardiomyopathy: Secondary | ICD-10-CM | POA: Diagnosis not present

## 2017-04-13 DIAGNOSIS — I130 Hypertensive heart and chronic kidney disease with heart failure and stage 1 through stage 4 chronic kidney disease, or unspecified chronic kidney disease: Secondary | ICD-10-CM | POA: Diagnosis not present

## 2017-04-15 IMAGING — CR DG CHEST 2V
3 series · 3 of 3 positions shown · non-contrast
Comparison: 08/23/2016

CLINICAL DATA: Shortness of breath

EXAM:
CHEST  2 VIEW

[w chest lat (1 of 2)]
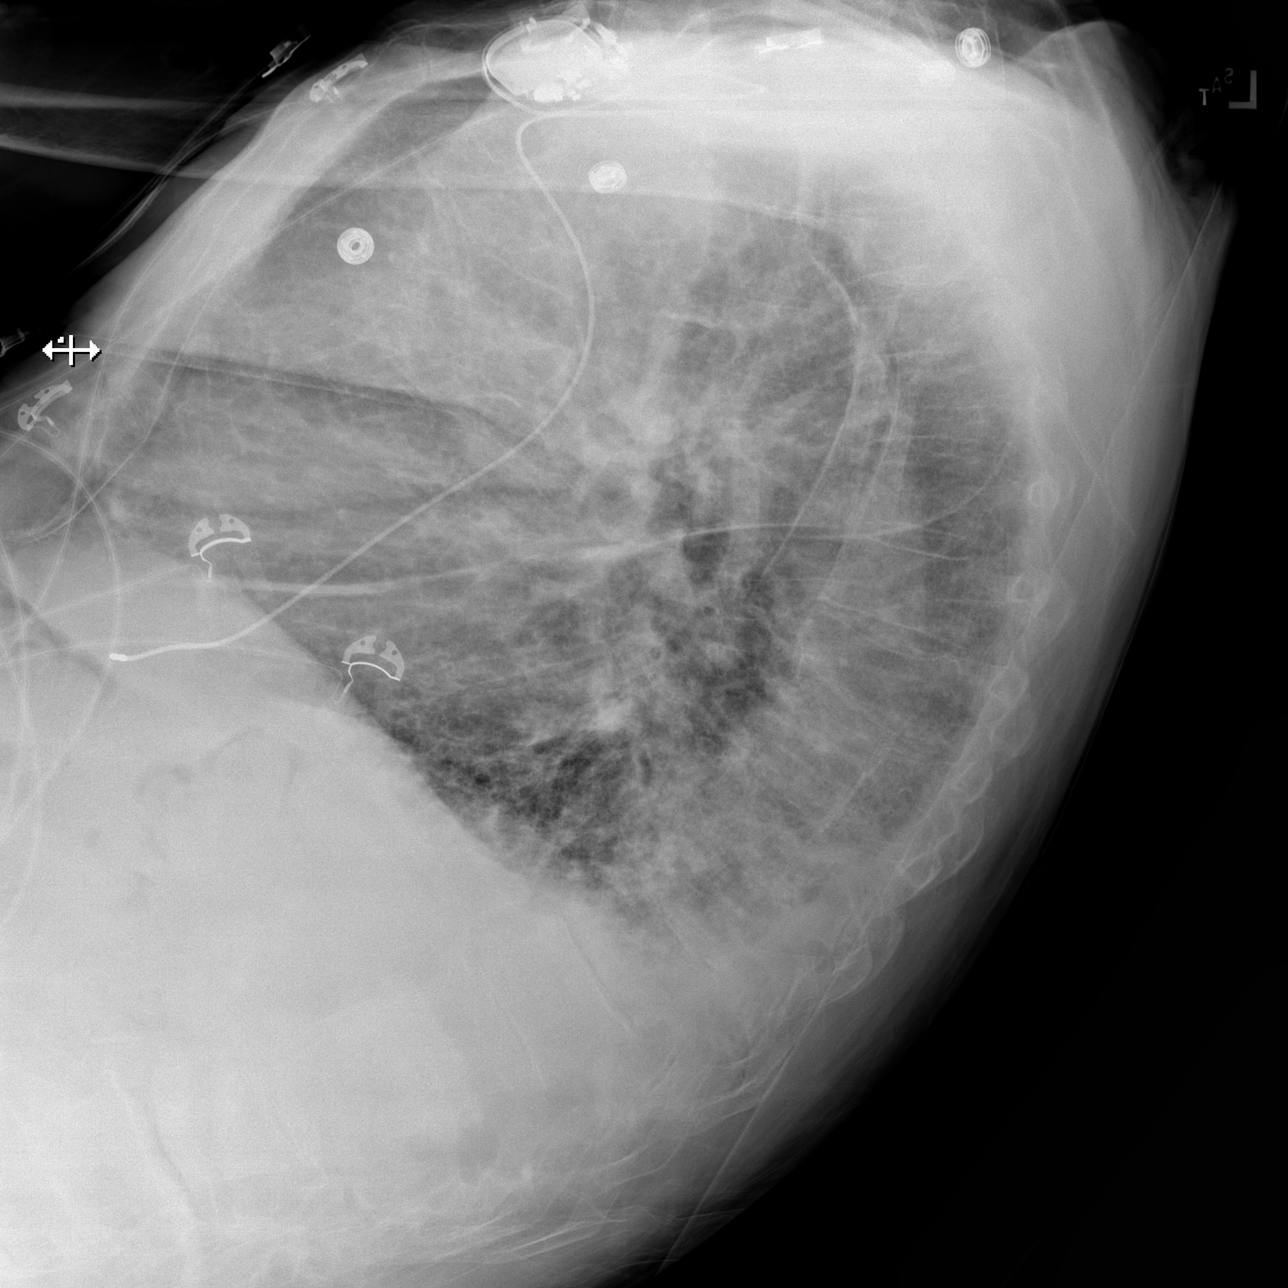

[w chest lat (2 of 2)]
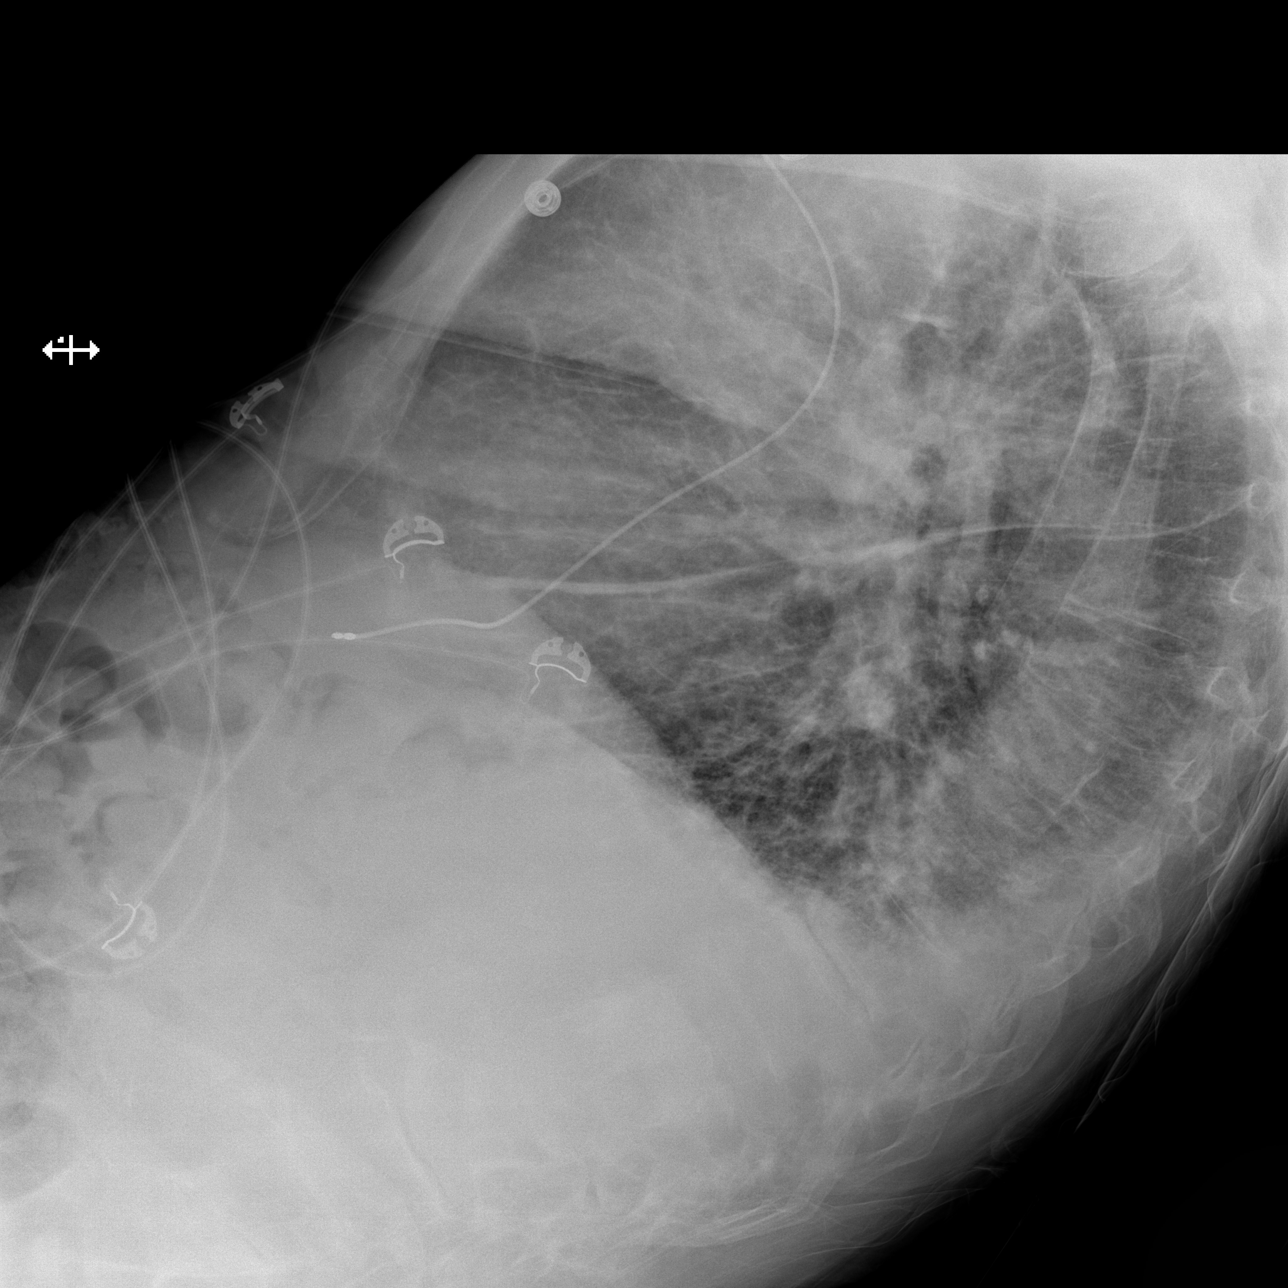

[x chest ap]
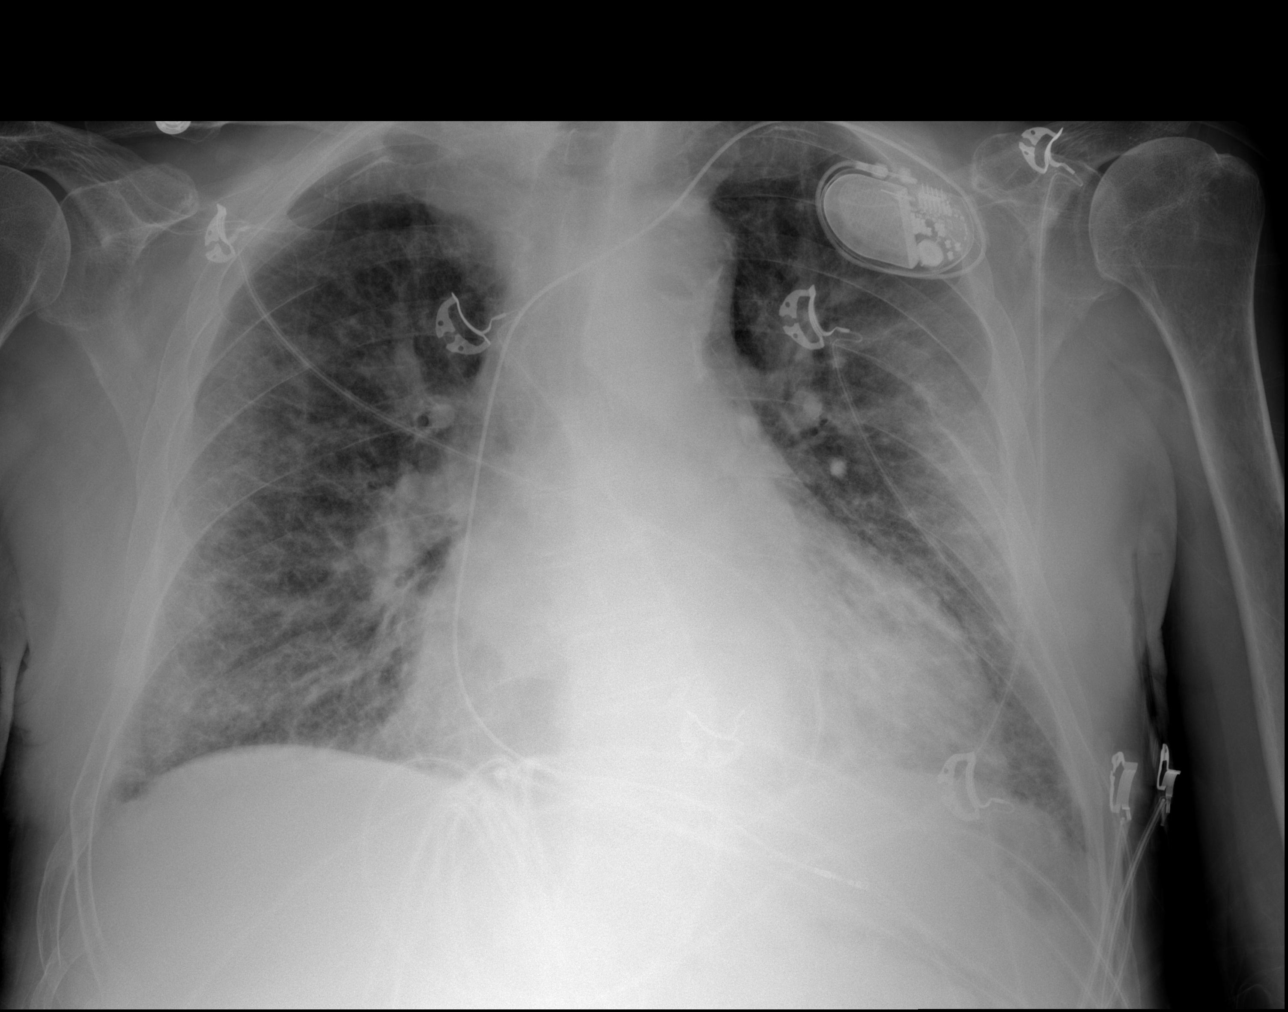

[3 of 3 positions shown; findings below may reference images not displayed]

FINDINGS: Chronic cardiopericardial enlargement. Single chamber pacer from the
left to the right ventricle. Vascular pedicle widening, diffuse
interstitial opacity and trace pleural effusions.
IMPRESSION: CHF.

## 2017-04-15 IMAGING — CR DG SACRUM/COCCYX 2+V
3 series · 3 of 3 positions shown · non-contrast
Comparison: Radiograph dated 10/15/2013

CLINICAL DATA: [AGE] male with fall and trauma to the
tailbone

EXAM:
SACRUM AND COCCYX - 2+ VIEW

[t sacrum ap]
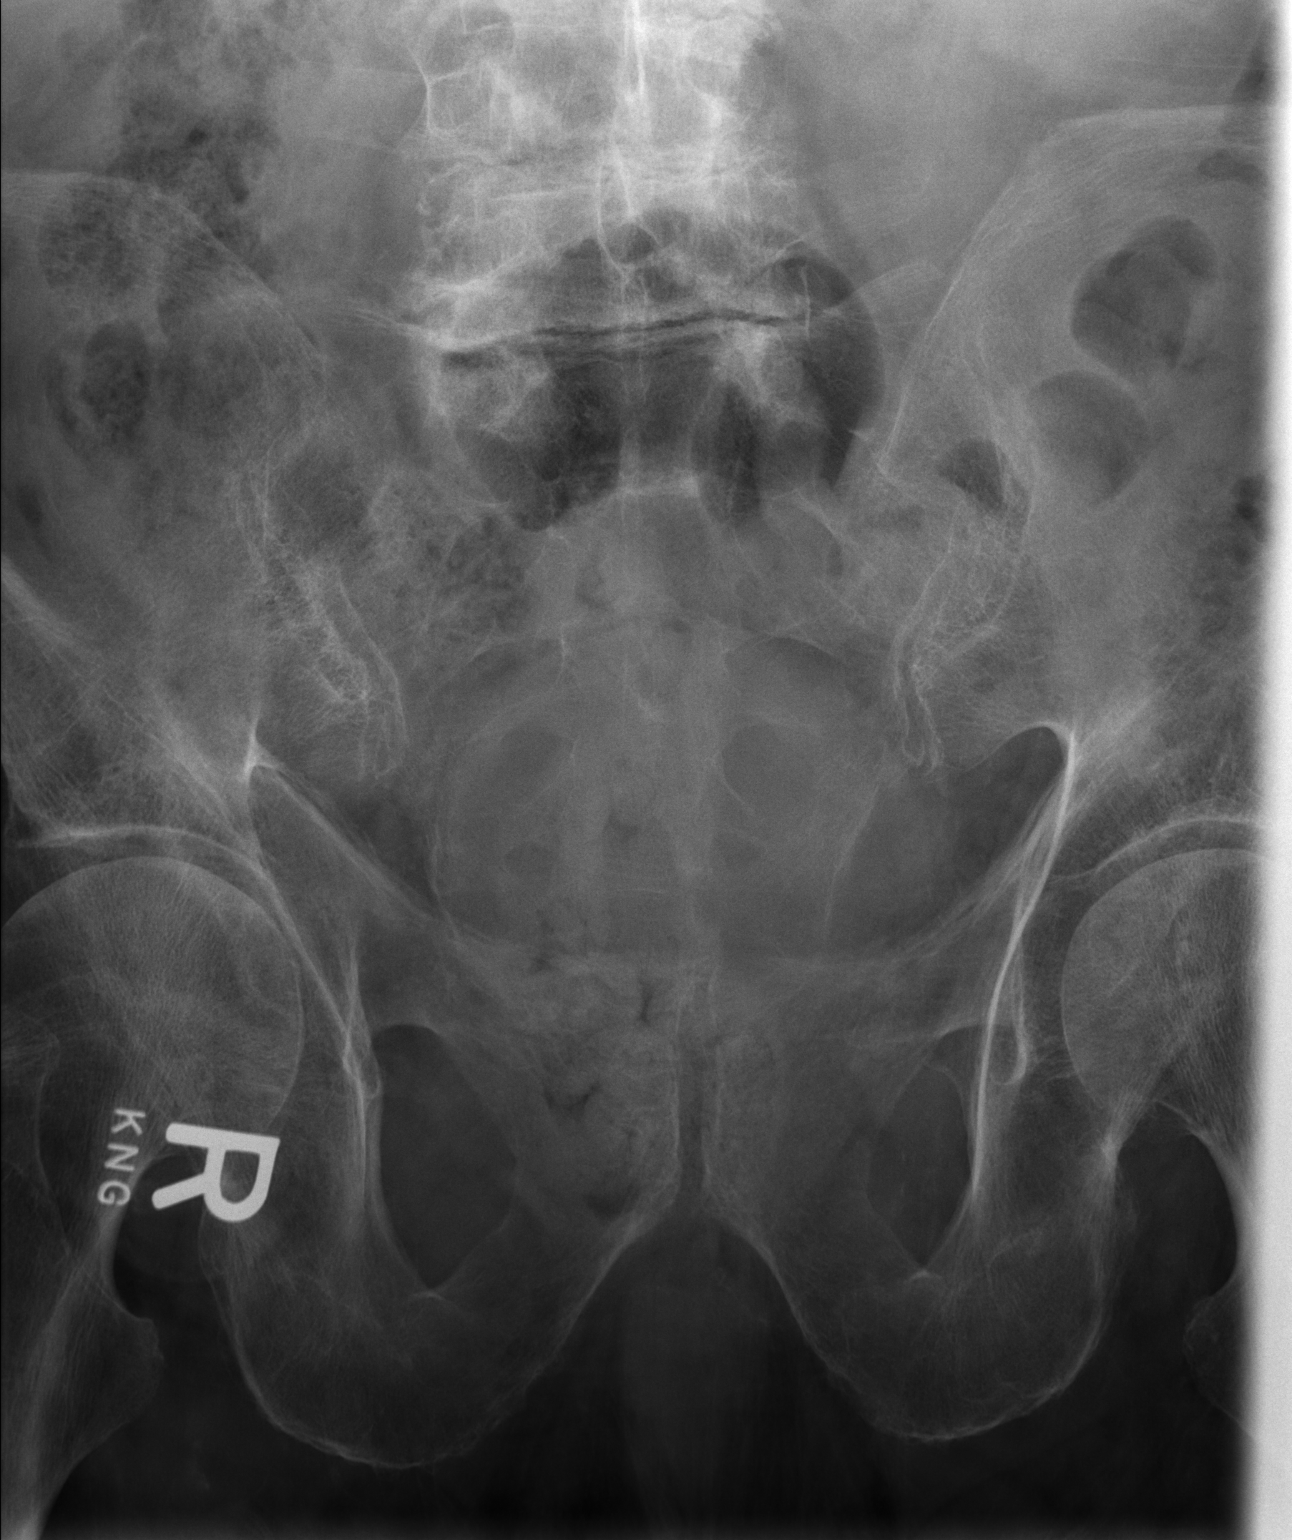

[t coccyx ap]
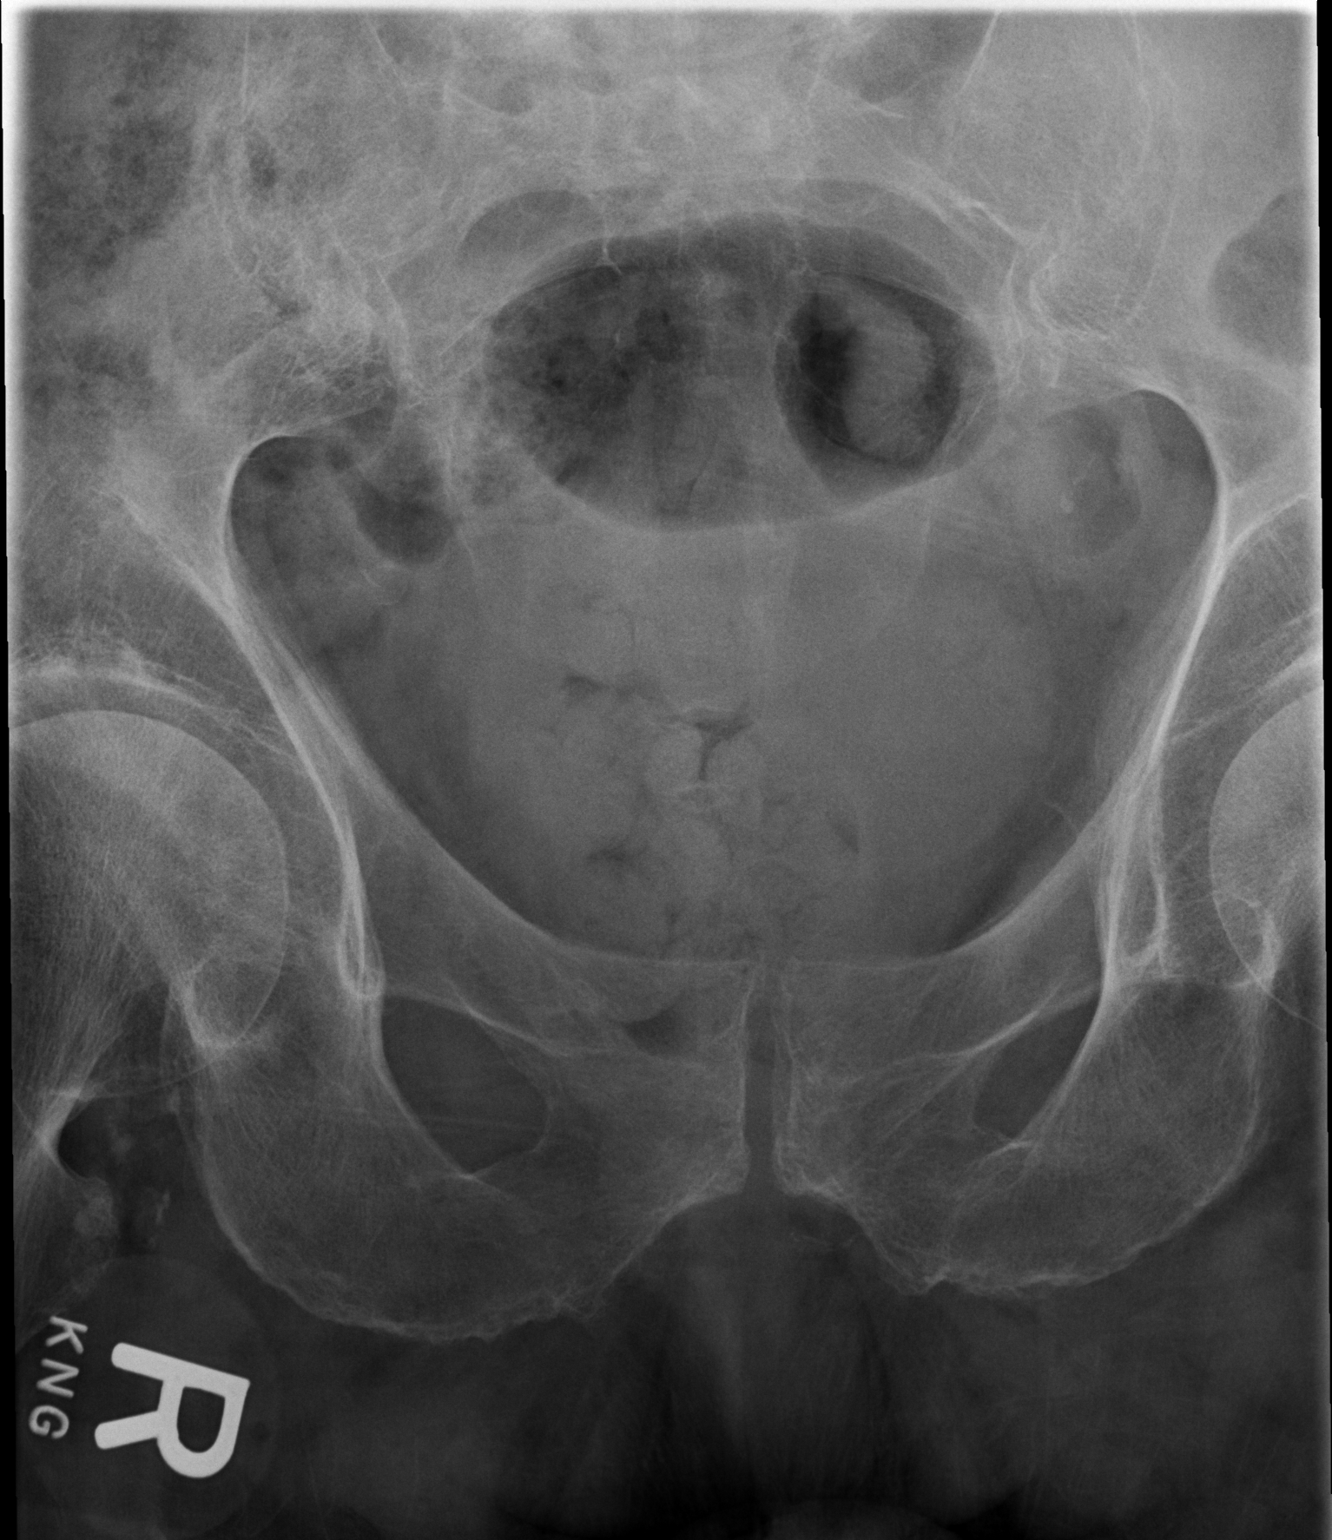

[t sacrum coccyx lat]
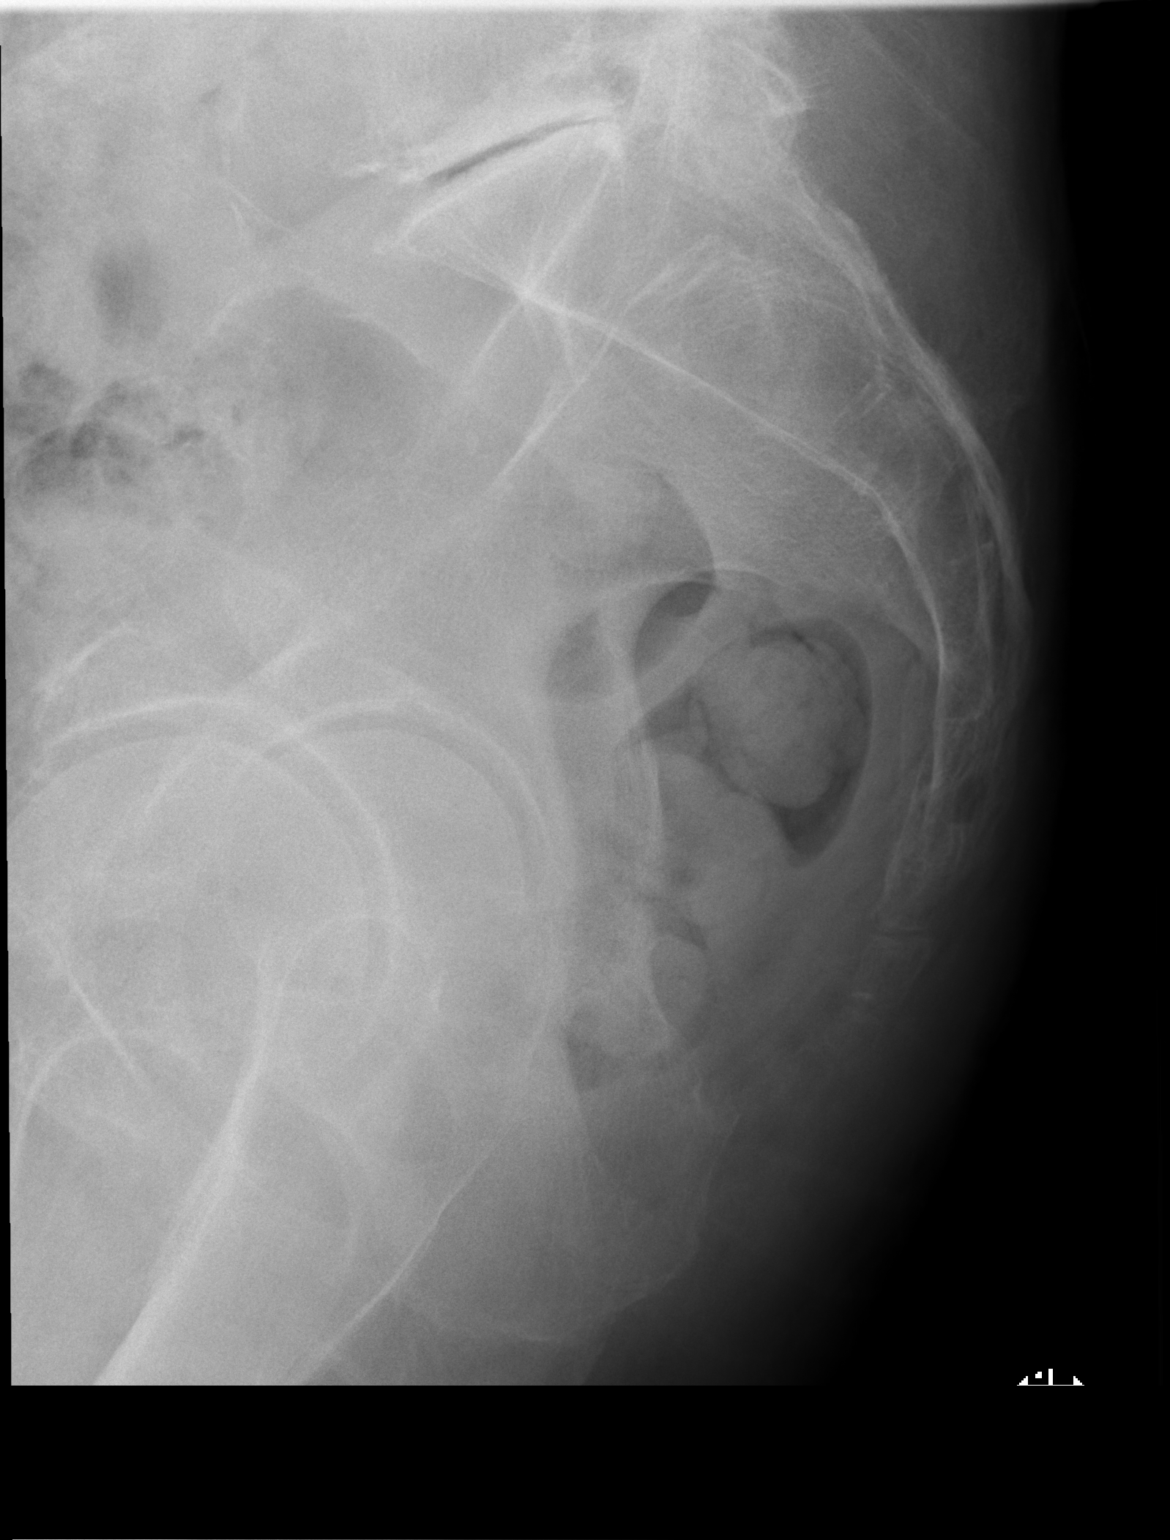

[3 of 3 positions shown; findings below may reference images not displayed]

FINDINGS: There is focal discontinuity of the distal sacral bone seen on the
lateral projection which is age indeterminate. Although this may be
chronic acute fracture is not excluded. Clinical correlation is
recommended. No other definite acute fracture identified. The bones
are osteopenic. There are degenerative changes of the visualized
lower lumbar spine and SI joints. Soft tissues are grossly
unremarkable.
IMPRESSION: Age indeterminate fracture of the distal sacrum. Clinical
correlation recommended.

## 2017-04-16 DIAGNOSIS — G8194 Hemiplegia, unspecified affecting left nondominant side: Secondary | ICD-10-CM | POA: Diagnosis not present

## 2017-04-16 DIAGNOSIS — F419 Anxiety disorder, unspecified: Secondary | ICD-10-CM | POA: Diagnosis not present

## 2017-04-16 DIAGNOSIS — I2510 Atherosclerotic heart disease of native coronary artery without angina pectoris: Secondary | ICD-10-CM | POA: Diagnosis not present

## 2017-04-16 DIAGNOSIS — I130 Hypertensive heart and chronic kidney disease with heart failure and stage 1 through stage 4 chronic kidney disease, or unspecified chronic kidney disease: Secondary | ICD-10-CM | POA: Diagnosis not present

## 2017-04-16 DIAGNOSIS — I5042 Chronic combined systolic (congestive) and diastolic (congestive) heart failure: Secondary | ICD-10-CM | POA: Diagnosis not present

## 2017-04-16 DIAGNOSIS — N183 Chronic kidney disease, stage 3 (moderate): Secondary | ICD-10-CM | POA: Diagnosis not present

## 2017-04-16 DIAGNOSIS — M1990 Unspecified osteoarthritis, unspecified site: Secondary | ICD-10-CM | POA: Diagnosis not present

## 2017-04-16 DIAGNOSIS — I480 Paroxysmal atrial fibrillation: Secondary | ICD-10-CM | POA: Diagnosis not present

## 2017-04-16 DIAGNOSIS — I255 Ischemic cardiomyopathy: Secondary | ICD-10-CM | POA: Diagnosis not present

## 2017-04-18 DIAGNOSIS — I480 Paroxysmal atrial fibrillation: Secondary | ICD-10-CM | POA: Diagnosis not present

## 2017-04-18 DIAGNOSIS — I130 Hypertensive heart and chronic kidney disease with heart failure and stage 1 through stage 4 chronic kidney disease, or unspecified chronic kidney disease: Secondary | ICD-10-CM | POA: Diagnosis not present

## 2017-04-18 DIAGNOSIS — I2510 Atherosclerotic heart disease of native coronary artery without angina pectoris: Secondary | ICD-10-CM | POA: Diagnosis not present

## 2017-04-18 DIAGNOSIS — I255 Ischemic cardiomyopathy: Secondary | ICD-10-CM | POA: Diagnosis not present

## 2017-04-18 DIAGNOSIS — G8194 Hemiplegia, unspecified affecting left nondominant side: Secondary | ICD-10-CM | POA: Diagnosis not present

## 2017-04-18 DIAGNOSIS — N183 Chronic kidney disease, stage 3 (moderate): Secondary | ICD-10-CM | POA: Diagnosis not present

## 2017-04-18 DIAGNOSIS — F419 Anxiety disorder, unspecified: Secondary | ICD-10-CM | POA: Diagnosis not present

## 2017-04-18 DIAGNOSIS — M1990 Unspecified osteoarthritis, unspecified site: Secondary | ICD-10-CM | POA: Diagnosis not present

## 2017-04-18 DIAGNOSIS — I5042 Chronic combined systolic (congestive) and diastolic (congestive) heart failure: Secondary | ICD-10-CM | POA: Diagnosis not present

## 2017-04-20 DIAGNOSIS — G8194 Hemiplegia, unspecified affecting left nondominant side: Secondary | ICD-10-CM | POA: Diagnosis not present

## 2017-04-20 DIAGNOSIS — F419 Anxiety disorder, unspecified: Secondary | ICD-10-CM | POA: Diagnosis not present

## 2017-04-20 DIAGNOSIS — I480 Paroxysmal atrial fibrillation: Secondary | ICD-10-CM | POA: Diagnosis not present

## 2017-04-20 DIAGNOSIS — I255 Ischemic cardiomyopathy: Secondary | ICD-10-CM | POA: Diagnosis not present

## 2017-04-20 DIAGNOSIS — I130 Hypertensive heart and chronic kidney disease with heart failure and stage 1 through stage 4 chronic kidney disease, or unspecified chronic kidney disease: Secondary | ICD-10-CM | POA: Diagnosis not present

## 2017-04-20 DIAGNOSIS — I2510 Atherosclerotic heart disease of native coronary artery without angina pectoris: Secondary | ICD-10-CM | POA: Diagnosis not present

## 2017-04-20 DIAGNOSIS — I5042 Chronic combined systolic (congestive) and diastolic (congestive) heart failure: Secondary | ICD-10-CM | POA: Diagnosis not present

## 2017-04-20 DIAGNOSIS — M1990 Unspecified osteoarthritis, unspecified site: Secondary | ICD-10-CM | POA: Diagnosis not present

## 2017-04-20 DIAGNOSIS — N183 Chronic kidney disease, stage 3 (moderate): Secondary | ICD-10-CM | POA: Diagnosis not present

## 2017-04-23 DIAGNOSIS — I2510 Atherosclerotic heart disease of native coronary artery without angina pectoris: Secondary | ICD-10-CM | POA: Diagnosis not present

## 2017-04-23 DIAGNOSIS — I130 Hypertensive heart and chronic kidney disease with heart failure and stage 1 through stage 4 chronic kidney disease, or unspecified chronic kidney disease: Secondary | ICD-10-CM | POA: Diagnosis not present

## 2017-04-23 DIAGNOSIS — N183 Chronic kidney disease, stage 3 (moderate): Secondary | ICD-10-CM | POA: Diagnosis not present

## 2017-04-23 DIAGNOSIS — I5042 Chronic combined systolic (congestive) and diastolic (congestive) heart failure: Secondary | ICD-10-CM | POA: Diagnosis not present

## 2017-04-23 DIAGNOSIS — I480 Paroxysmal atrial fibrillation: Secondary | ICD-10-CM | POA: Diagnosis not present

## 2017-04-23 DIAGNOSIS — G8194 Hemiplegia, unspecified affecting left nondominant side: Secondary | ICD-10-CM | POA: Diagnosis not present

## 2017-04-23 DIAGNOSIS — I255 Ischemic cardiomyopathy: Secondary | ICD-10-CM | POA: Diagnosis not present

## 2017-04-23 DIAGNOSIS — F419 Anxiety disorder, unspecified: Secondary | ICD-10-CM | POA: Diagnosis not present

## 2017-04-23 DIAGNOSIS — M1990 Unspecified osteoarthritis, unspecified site: Secondary | ICD-10-CM | POA: Diagnosis not present

## 2017-05-03 DIAGNOSIS — N183 Chronic kidney disease, stage 3 (moderate): Secondary | ICD-10-CM | POA: Diagnosis not present

## 2017-05-03 DIAGNOSIS — I2510 Atherosclerotic heart disease of native coronary artery without angina pectoris: Secondary | ICD-10-CM | POA: Diagnosis not present

## 2017-05-03 DIAGNOSIS — M1990 Unspecified osteoarthritis, unspecified site: Secondary | ICD-10-CM | POA: Diagnosis not present

## 2017-05-03 DIAGNOSIS — I480 Paroxysmal atrial fibrillation: Secondary | ICD-10-CM | POA: Diagnosis not present

## 2017-05-03 DIAGNOSIS — I255 Ischemic cardiomyopathy: Secondary | ICD-10-CM | POA: Diagnosis not present

## 2017-05-03 DIAGNOSIS — G8194 Hemiplegia, unspecified affecting left nondominant side: Secondary | ICD-10-CM | POA: Diagnosis not present

## 2017-05-03 DIAGNOSIS — I5042 Chronic combined systolic (congestive) and diastolic (congestive) heart failure: Secondary | ICD-10-CM | POA: Diagnosis not present

## 2017-05-03 DIAGNOSIS — F419 Anxiety disorder, unspecified: Secondary | ICD-10-CM | POA: Diagnosis not present

## 2017-05-03 DIAGNOSIS — I130 Hypertensive heart and chronic kidney disease with heart failure and stage 1 through stage 4 chronic kidney disease, or unspecified chronic kidney disease: Secondary | ICD-10-CM | POA: Diagnosis not present

## 2017-05-04 DIAGNOSIS — N183 Chronic kidney disease, stage 3 (moderate): Secondary | ICD-10-CM | POA: Diagnosis not present

## 2017-05-04 DIAGNOSIS — G8194 Hemiplegia, unspecified affecting left nondominant side: Secondary | ICD-10-CM | POA: Diagnosis not present

## 2017-05-04 DIAGNOSIS — M1990 Unspecified osteoarthritis, unspecified site: Secondary | ICD-10-CM | POA: Diagnosis not present

## 2017-05-04 DIAGNOSIS — I480 Paroxysmal atrial fibrillation: Secondary | ICD-10-CM | POA: Diagnosis not present

## 2017-05-04 DIAGNOSIS — I255 Ischemic cardiomyopathy: Secondary | ICD-10-CM | POA: Diagnosis not present

## 2017-05-04 DIAGNOSIS — F419 Anxiety disorder, unspecified: Secondary | ICD-10-CM | POA: Diagnosis not present

## 2017-05-04 DIAGNOSIS — I5042 Chronic combined systolic (congestive) and diastolic (congestive) heart failure: Secondary | ICD-10-CM | POA: Diagnosis not present

## 2017-05-04 DIAGNOSIS — I130 Hypertensive heart and chronic kidney disease with heart failure and stage 1 through stage 4 chronic kidney disease, or unspecified chronic kidney disease: Secondary | ICD-10-CM | POA: Diagnosis not present

## 2017-05-04 DIAGNOSIS — I2510 Atherosclerotic heart disease of native coronary artery without angina pectoris: Secondary | ICD-10-CM | POA: Diagnosis not present

## 2017-05-07 DIAGNOSIS — L570 Actinic keratosis: Secondary | ICD-10-CM | POA: Diagnosis not present

## 2017-05-07 DIAGNOSIS — D485 Neoplasm of uncertain behavior of skin: Secondary | ICD-10-CM | POA: Diagnosis not present

## 2017-05-07 DIAGNOSIS — C44622 Squamous cell carcinoma of skin of right upper limb, including shoulder: Secondary | ICD-10-CM | POA: Diagnosis not present

## 2017-05-18 ENCOUNTER — Telehealth: Payer: Self-pay | Admitting: Family Medicine

## 2017-05-18 DIAGNOSIS — M25551 Pain in right hip: Secondary | ICD-10-CM

## 2017-05-18 NOTE — Telephone Encounter (Signed)
Referral placed.

## 2017-05-18 NOTE — Telephone Encounter (Signed)
OK 

## 2017-05-18 NOTE — Telephone Encounter (Signed)
Pt need a referral to Dr Nelva Bush ([place])

## 2017-06-07 ENCOUNTER — Other Ambulatory Visit: Payer: Self-pay

## 2017-06-07 MED ORDER — CARVEDILOL 6.25 MG PO TABS: 6.2500 mg | ORAL_TABLET | Freq: Two times a day (BID) | ORAL | 2 refills | Status: DC

## 2017-06-12 DIAGNOSIS — M5441 Lumbago with sciatica, right side: Secondary | ICD-10-CM | POA: Diagnosis not present

## 2017-06-16 ENCOUNTER — Other Ambulatory Visit: Payer: Self-pay | Admitting: Family Medicine

## 2017-06-26 DIAGNOSIS — C44622 Squamous cell carcinoma of skin of right upper limb, including shoulder: Secondary | ICD-10-CM | POA: Diagnosis not present

## 2017-07-01 ENCOUNTER — Other Ambulatory Visit: Payer: Self-pay | Admitting: Family Medicine

## 2017-07-05 ENCOUNTER — Other Ambulatory Visit: Payer: Self-pay | Admitting: Family Medicine

## 2017-07-05 NOTE — Telephone Encounter (Signed)
Last Rx given on 1/2 for #60 with 5 ref

## 2017-07-06 NOTE — Telephone Encounter (Signed)
Refill for 6 months. 

## 2017-07-06 NOTE — Telephone Encounter (Signed)
Rx done. 

## 2017-08-18 ENCOUNTER — Other Ambulatory Visit: Payer: Self-pay | Admitting: Family Medicine

## 2017-08-20 NOTE — Telephone Encounter (Signed)
TRAMADOL HCL 50 MG TABLET Last office visit and refill 03/26/17.  Okay to fill?

## 2017-08-20 NOTE — Telephone Encounter (Signed)
Refill both OK.

## 2017-09-11 ENCOUNTER — Encounter: Payer: Self-pay | Admitting: Family Medicine

## 2017-09-11 ENCOUNTER — Ambulatory Visit (INDEPENDENT_AMBULATORY_CARE_PROVIDER_SITE_OTHER): Payer: Medicare HMO | Admitting: Family Medicine

## 2017-09-11 VITALS — BP 102/64 | HR 60 | Temp 97.8°F | Wt 158.6 lb

## 2017-09-11 DIAGNOSIS — N183 Chronic kidney disease, stage 3 unspecified: Secondary | ICD-10-CM

## 2017-09-11 DIAGNOSIS — G2581 Restless legs syndrome: Secondary | ICD-10-CM

## 2017-09-11 DIAGNOSIS — Z23 Encounter for immunization: Secondary | ICD-10-CM | POA: Diagnosis not present

## 2017-09-11 DIAGNOSIS — I5022 Chronic systolic (congestive) heart failure: Secondary | ICD-10-CM | POA: Diagnosis not present

## 2017-09-11 DIAGNOSIS — I10 Essential (primary) hypertension: Secondary | ICD-10-CM | POA: Diagnosis not present

## 2017-09-11 LAB — BASIC METABOLIC PANEL
BUN: 32 mg/dL — AB (ref 6–23)
CHLORIDE: 103 meq/L (ref 96–112)
CO2: 31 mEq/L (ref 19–32)
CREATININE: 1.63 mg/dL — AB (ref 0.40–1.50)
Calcium: 9.4 mg/dL (ref 8.4–10.5)
GFR: 41.96 mL/min — ABNORMAL LOW (ref >60.00)
GLUCOSE: 118 mg/dL — AB (ref 70–99)
POTASSIUM: 4.2 meq/L (ref 3.5–5.1)
Sodium: 141 mEq/L (ref 135–145)

## 2017-09-11 MED ORDER — PRAMIPEXOLE DIHYDROCHLORIDE 0.125 MG PO TABS: ORAL_TABLET | ORAL | 1 refills | Status: DC

## 2017-09-11 MED ORDER — TRAMADOL HCL 50 MG PO TABS: 50.0000 mg | ORAL_TABLET | Freq: Four times a day (QID) | ORAL | 1 refills | Status: DC | PRN

## 2017-09-11 NOTE — Progress Notes (Signed)
Subjective:     Patient ID: Micheal Nicholson, male   DOB: 23-Aug-1920, 83 y.o.   MRN: [id number]  HPI Patient has turned 89 since last visit here. He has multiple chronic problems including history of CHF, atrial fibrillation, CAD, cerebrovascular disease, hypertension, GERD, restless leg syndrome, chronic kidney disease, chronic anxiety, increased risk of falls  Here with son today to discuss the following issues  He has some chronic low back pain and they're requesting refills of tramadol. He takes this infrequently. Denies fall since last visit  Legs frequently fill "jumpy "at night. This is interfering with sleep. He does take Mirapex and low dose of 0.125 mg. Tries to avoid caffeine.  Long history of chronic anxiety. We've tried in the past multiple times to get him off Xanax because of risk of falls and he has had great difficulty each time. His son has recently given some of his Xanax at night as an extra dosage. He currently takes 0.5 mg twice daily with occasional supplements with third dose at night helps sleep.  Patient still gets out and rides riding mower. We have discouraged any driving of motor vehicles  Past Medical History:  Diagnosis Date  . Anxiety   . Arthritis   . BPH (benign prostatic hyperplasia)   . CHF (congestive heart failure) ([place])   . Chronic combined systolic and diastolic CHF, NYHA class 2 ([place]) 04/17/2015  . GERD (gastroesophageal reflux disease)   . Hypertension   . Stroke ([place])   . Symptomatic sinus bradycardia    PPM Dr. Rayann Heman, 01/13/16, STJ device   Past Surgical History:  Procedure Laterality Date  . CARDIAC CATHETERIZATION  2006   Negative / 4 years ago  . EP IMPLANTABLE DEVICE N/A 01/13/2016   Procedure: Pacemaker Implant;  Surgeon: Thompson Grayer, MD;  Location: [facility];  Service: Cardiovascular;  Laterality: N/A;    reports that he has quit smoking. He has a 2.50 pack-year smoking history. He has never used smokeless tobacco. He  reports that he drinks about 0.6 oz of alcohol per week . He reports that he does not use drugs. family history includes Healthy in his son; Heart disease in his brother; Heart failure in his son; Lung cancer in his brother; Pancreatic cancer in his son; Pneumonia (age of onset: 86) in his father. Allergies  Allergen Reactions  . Chocolate Other (See Comments)    Reaction:  Acid reflux      Review of Systems  Constitutional: Negative for chills and fever.  HENT: Negative for trouble swallowing.   Respiratory: Negative for shortness of breath.   Cardiovascular: Negative for chest pain.  Gastrointestinal: Negative for abdominal pain.  Musculoskeletal: Positive for back pain.       Objective:   Physical Exam  Constitutional:  Alert hard of hearing elderly gentleman in no distress  Neck: Neck supple.  Cardiovascular: Normal rate and regular rhythm.   Pulmonary/Chest: Effort normal and breath sounds normal. No respiratory distress. He has no wheezes. He has no rales.  Abdominal: Soft. There is no tenderness.  Musculoskeletal:  Only trace edema legs bilaterally  Neurological: He is alert.       Assessment:     #1 chronic low back pain.  #2 restless leg syndrome  #3 hypertension stable  #4 history of chronic anxiety with chronic benzodiazepine use    Plan:     -Flu vaccine given -Titrate Mirapex up to 0.25 mg daily at bedtime -We discouraged further titration of  alprazolam and we tried many times the past to get him off this -Refill tramadol to use infrequently for severe low back pain. -Routine follow-up in 3 months to reassess and sooner as needed -check BMP with chronic lasix use.  Micheal Post MD [facility] at [facility]

## 2017-10-28 ENCOUNTER — Other Ambulatory Visit: Payer: Self-pay | Admitting: Cardiovascular Disease

## 2017-10-30 ENCOUNTER — Other Ambulatory Visit: Payer: Self-pay | Admitting: Cardiovascular Disease

## 2017-10-30 ENCOUNTER — Other Ambulatory Visit: Payer: Self-pay | Admitting: Family Medicine

## 2017-10-30 NOTE — Telephone Encounter (Signed)
REFILL 

## 2017-10-31 NOTE — Telephone Encounter (Signed)
Last office visit and refill 09/11/17.  Okay to refill?

## 2017-10-31 NOTE — Telephone Encounter (Signed)
Refill with 2 additional refills. 

## 2017-11-27 ENCOUNTER — Other Ambulatory Visit: Payer: Self-pay | Admitting: Cardiovascular Disease

## 2017-11-27 ENCOUNTER — Other Ambulatory Visit: Payer: Self-pay | Admitting: Family Medicine

## 2017-12-06 DIAGNOSIS — M5136 Other intervertebral disc degeneration, lumbar region: Secondary | ICD-10-CM | POA: Diagnosis not present

## 2017-12-06 DIAGNOSIS — M5441 Lumbago with sciatica, right side: Secondary | ICD-10-CM | POA: Diagnosis not present

## 2017-12-14 ENCOUNTER — Other Ambulatory Visit: Payer: Self-pay | Admitting: Cardiovascular Disease

## 2018-01-15 ENCOUNTER — Other Ambulatory Visit: Payer: Self-pay | Admitting: Family Medicine

## 2018-01-15 NOTE — Telephone Encounter (Signed)
Last refill 07/06/17 and last office visit 09/11/17. Okay to refill?

## 2018-01-15 NOTE — Telephone Encounter (Signed)
Refill for 3 months. 

## 2018-01-20 ENCOUNTER — Other Ambulatory Visit: Payer: Self-pay | Admitting: Family Medicine

## 2018-02-06 ENCOUNTER — Other Ambulatory Visit: Payer: Self-pay | Admitting: Cardiovascular Disease

## 2018-02-14 ENCOUNTER — Other Ambulatory Visit: Payer: Self-pay | Admitting: Cardiovascular Disease

## 2018-02-27 ENCOUNTER — Other Ambulatory Visit: Payer: Self-pay | Admitting: Neurology

## 2018-02-28 ENCOUNTER — Other Ambulatory Visit: Payer: Self-pay

## 2018-02-28 MED ORDER — ELIQUIS 5 MG PO TABS: 5.0000 mg | ORAL_TABLET | Freq: Two times a day (BID) | ORAL | 2 refills | Status: DC

## 2018-03-14 ENCOUNTER — Other Ambulatory Visit: Payer: Self-pay | Admitting: Family Medicine

## 2018-04-10 ENCOUNTER — Other Ambulatory Visit: Payer: Self-pay | Admitting: *Deleted

## 2018-04-10 MED ORDER — SERTRALINE HCL 100 MG PO TABS: 50.0000 mg | ORAL_TABLET | Freq: Every day | ORAL | 0 refills | Status: DC

## 2018-04-14 ENCOUNTER — Other Ambulatory Visit: Payer: Self-pay | Admitting: Cardiology

## 2018-04-14 ENCOUNTER — Other Ambulatory Visit: Payer: Self-pay | Admitting: Family Medicine

## 2018-04-14 ENCOUNTER — Other Ambulatory Visit: Payer: Self-pay | Admitting: Cardiovascular Disease

## 2018-04-15 NOTE — Telephone Encounter (Signed)
Rx(s) sent to pharmacy electronically.  

## 2018-04-15 NOTE — Telephone Encounter (Signed)
Rx request sent to pharmacy.  

## 2018-04-28 ENCOUNTER — Other Ambulatory Visit: Payer: Self-pay | Admitting: Family Medicine

## 2018-04-29 NOTE — Telephone Encounter (Signed)
Refill once.  Needs follow up soon

## 2018-04-29 NOTE — Telephone Encounter (Signed)
Last refill 01/16/18 last office visit 09/11/17.  Okay to fill?

## 2018-05-01 ENCOUNTER — Other Ambulatory Visit: Payer: Self-pay | Admitting: Family Medicine

## 2018-05-14 ENCOUNTER — Other Ambulatory Visit: Payer: Self-pay | Admitting: Cardiovascular Disease

## 2018-05-14 NOTE — Telephone Encounter (Signed)
REFILL 

## 2018-05-19 ENCOUNTER — Other Ambulatory Visit: Payer: Self-pay | Admitting: Cardiovascular Disease

## 2018-05-21 DIAGNOSIS — L570 Actinic keratosis: Secondary | ICD-10-CM | POA: Diagnosis not present

## 2018-05-21 DIAGNOSIS — Z85828 Personal history of other malignant neoplasm of skin: Secondary | ICD-10-CM | POA: Diagnosis not present

## 2018-05-21 DIAGNOSIS — L821 Other seborrheic keratosis: Secondary | ICD-10-CM | POA: Diagnosis not present

## 2018-05-21 DIAGNOSIS — C44329 Squamous cell carcinoma of skin of other parts of face: Secondary | ICD-10-CM | POA: Diagnosis not present

## 2018-05-21 DIAGNOSIS — D485 Neoplasm of uncertain behavior of skin: Secondary | ICD-10-CM | POA: Diagnosis not present

## 2018-05-29 ENCOUNTER — Encounter: Payer: Self-pay | Admitting: Family Medicine

## 2018-05-29 ENCOUNTER — Ambulatory Visit (INDEPENDENT_AMBULATORY_CARE_PROVIDER_SITE_OTHER): Payer: Medicare HMO | Admitting: Family Medicine

## 2018-05-29 VITALS — BP 120/78 | HR 57 | Temp 98.1°F | Wt 156.3 lb

## 2018-05-29 DIAGNOSIS — I5042 Chronic combined systolic (congestive) and diastolic (congestive) heart failure: Secondary | ICD-10-CM

## 2018-05-29 DIAGNOSIS — D649 Anemia, unspecified: Secondary | ICD-10-CM

## 2018-05-29 DIAGNOSIS — I10 Essential (primary) hypertension: Secondary | ICD-10-CM

## 2018-05-29 DIAGNOSIS — N183 Chronic kidney disease, stage 3 unspecified: Secondary | ICD-10-CM

## 2018-05-29 LAB — BASIC METABOLIC PANEL
BUN: 33 mg/dL — ABNORMAL HIGH (ref 6–23)
CALCIUM: 9.3 mg/dL (ref 8.4–10.5)
CO2: 28 mEq/L (ref 19–32)
Chloride: 103 mEq/L (ref 96–112)
Creatinine, Ser: 1.75 mg/dL — ABNORMAL HIGH (ref 0.40–1.50)
GFR: 38.59 mL/min — AB (ref >60.00)
Glucose, Bld: 98 mg/dL (ref 70–99)
Potassium: 4.5 mEq/L (ref 3.5–5.1)
SODIUM: 141 meq/L (ref 135–145)

## 2018-05-29 LAB — CBC WITH DIFFERENTIAL/PLATELET
BASOS ABS: 0 10*3/uL (ref 0.0–0.1)
Basophils Relative: 0.5 % (ref 0.0–3.0)
EOS ABS: 0.2 10*3/uL (ref 0.0–0.7)
Eosinophils Relative: 3.7 % (ref 0.0–5.0)
HCT: 36.4 % — ABNORMAL LOW (ref 39.0–52.0)
Hemoglobin: 12.1 g/dL — ABNORMAL LOW (ref 13.0–17.0)
LYMPHS PCT: 22.4 % (ref 12.0–46.0)
Lymphs Abs: 0.9 10*3/uL (ref 0.7–4.0)
MCHC: 33.3 g/dL (ref 30.0–36.0)
MCV: 88.4 fl (ref 78.0–100.0)
MONOS PCT: 8.3 % (ref 3.0–12.0)
Monocytes Absolute: 0.3 10*3/uL (ref 0.1–1.0)
NEUTROS PCT: 65.1 % (ref 43.0–77.0)
Neutro Abs: 2.7 10*3/uL (ref 1.4–7.7)
Platelets: 207 10*3/uL (ref 150.0–400.0)
RBC: 4.11 Mil/uL — AB (ref 4.22–5.81)
RDW: 15.3 % (ref 11.5–15.5)
WBC: 4.2 10*3/uL (ref 4.0–10.5)

## 2018-05-29 MED ORDER — SERTRALINE HCL 100 MG PO TABS: ORAL_TABLET | ORAL | 3 refills | Status: AC

## 2018-05-29 MED ORDER — ALPRAZOLAM 0.5 MG PO TABS: ORAL_TABLET | ORAL | 1 refills | Status: DC

## 2018-05-29 MED ORDER — TRAMADOL HCL 50 MG PO TABS: 50.0000 mg | ORAL_TABLET | Freq: Four times a day (QID) | ORAL | 2 refills | Status: DC | PRN

## 2018-05-29 MED ORDER — CARVEDILOL 6.25 MG PO TABS: ORAL_TABLET | ORAL | 3 refills | Status: DC

## 2018-05-29 MED ORDER — FUROSEMIDE 40 MG PO TABS: 40.0000 mg | ORAL_TABLET | Freq: Every day | ORAL | 0 refills | Status: DC

## 2018-05-29 MED ORDER — ELIQUIS 5 MG PO TABS: 5.0000 mg | ORAL_TABLET | Freq: Two times a day (BID) | ORAL | 3 refills | Status: DC

## 2018-05-29 NOTE — Progress Notes (Signed)
Subjective:     Patient ID: Micheal Nicholson, male   DOB: 12-Dec-1920, 84 y.o.   MRN: [id number]  HPI Patient for medical follow-up. Accompanied by son. He still lives alone and actually still mows his yard on a riding mower. Ambulates with a cane. No recent falls.  Appetite is fair. Weight down 2 pounds from last visit. Chronic prompt include hypertension, atrial fibrillation, combined systolic and diastolic heart failure, history of CVA, BPH, chronic kidney disease, chronic normocytic anemia, history of restless leg syndrome.  Also has history of recurrent depression currently stable on sertraline. He's been on Xanax for many years. Family aware of increased risk of falls but he is decompensated every time he has come off this.  Has tramadol for back pain and arthritis pain but takes infrequently  Past Medical History:  Diagnosis Date  . Anxiety   . Arthritis   . BPH (benign prostatic hyperplasia)   . CHF (congestive heart failure) ([place])   . Chronic combined systolic and diastolic CHF, NYHA class 2 ([place]) 04/17/2015  . GERD (gastroesophageal reflux disease)   . Hypertension   . Stroke ([place])   . Symptomatic sinus bradycardia    PPM Dr. Rayann Heman, 01/13/16, STJ device   Past Surgical History:  Procedure Laterality Date  . CARDIAC CATHETERIZATION  2006   Negative / 4 years ago  . EP IMPLANTABLE DEVICE N/A 01/13/2016   Procedure: Pacemaker Implant;  Surgeon: Thompson Grayer, MD;  Location: [facility];  Service: Cardiovascular;  Laterality: N/A;    reports that he has quit smoking. He has a 2.50 pack-year smoking history. He has never used smokeless tobacco. He reports that he drinks about 0.6 oz of alcohol per week. He reports that he does not use drugs. family history includes Healthy in his son; Heart disease in his brother; Heart failure in his son; Lung cancer in his brother; Pancreatic cancer in his son; Pneumonia (age of onset: 35) in his father. Allergies  Allergen Reactions   . Chocolate Other (See Comments)    Reaction:  Acid reflux      Review of Systems  Constitutional: Negative for chills, fatigue, fever and unexpected weight change.  Eyes: Negative for visual disturbance.  Respiratory: Negative for cough, chest tightness and shortness of breath.   Cardiovascular: Negative for chest pain, palpitations and leg swelling.  Gastrointestinal: Negative for abdominal pain.  Genitourinary: Negative for dysuria.  Musculoskeletal: Positive for arthralgias.  Neurological: Negative for dizziness, syncope, weakness, light-headedness and headaches.       Objective:   Physical Exam  Constitutional: He is oriented to person, place, and time. He appears well-developed and well-nourished.  HENT:  Right Ear: External ear normal.  Left Ear: External ear normal.  Mouth/Throat: Oropharynx is clear and moist.  Eyes: Pupils are equal, round, and reactive to light.  Neck: Neck supple. No thyromegaly present.  Cardiovascular: Normal rate and regular rhythm.  Pulmonary/Chest: Effort normal and breath sounds normal. No respiratory distress. He has no wheezes. He has no rales.  Musculoskeletal: He exhibits edema.  Patient has 1+ pitting edema lower legs and ankles bilaterally  Neurological: He is alert and oriented to person, place, and time.       Assessment:     #1 history of combined systolic and diastolic heart failure currently stable symptomatically   #2 hypertension stable and at goal  #3 chronic kidney disease  #4 chronic normocytic anemia  #5 history of recurrent depression currently stable on sertraline  #6  history of chronic anxiety  #7 history of atrial fibrillation on anticoagulation with eliquis        Plan:     -Check labs with basic metabolic panel and CBC -Refilled several medications including carvedilol, furosemide, alprazolam, sertraline, eliquis, and tramadol -We discussed fall risk and encouraged him to use cane at all times.  Family aware of increased risk of falls on chronic anticoagulation with eliquis -Family has realistic expectations regarding making quality-of-life priority at this point -Routine follow-up in 6 months consider as needed  [name] MD [facility] at [facility]

## 2018-06-13 ENCOUNTER — Other Ambulatory Visit: Payer: Self-pay | Admitting: Neurology

## 2018-06-13 ENCOUNTER — Ambulatory Visit (INDEPENDENT_AMBULATORY_CARE_PROVIDER_SITE_OTHER): Payer: Medicare HMO | Admitting: Family Medicine

## 2018-06-13 ENCOUNTER — Other Ambulatory Visit: Payer: Self-pay | Admitting: Family Medicine

## 2018-06-13 ENCOUNTER — Encounter: Payer: Self-pay | Admitting: Family Medicine

## 2018-06-13 VITALS — BP 110/68 | HR 86 | Temp 98.4°F | Wt 151.0 lb

## 2018-06-13 DIAGNOSIS — R0982 Postnasal drip: Secondary | ICD-10-CM

## 2018-06-13 DIAGNOSIS — R05 Cough: Secondary | ICD-10-CM | POA: Diagnosis not present

## 2018-06-13 DIAGNOSIS — H6122 Impacted cerumen, left ear: Secondary | ICD-10-CM | POA: Diagnosis not present

## 2018-06-13 DIAGNOSIS — R059 Cough, unspecified: Secondary | ICD-10-CM

## 2018-06-13 NOTE — Progress Notes (Signed)
Subjective:    Patient ID: Micheal Nicholson, male    DOB: January 20, 1920, 84 y.o.   MRN: [id number]  No chief complaint on file. Patient is accompanied by his son.  HPI Patient was seen today for acute concern.  Pt endorses rhinorrhea and coughing up thick gray phlegm x3 to 4 days and a history of seasonal allergies-taking flonase.  Pt denies sore throat, though states it may be slightly irritated from coughing.  Pt denies headache, facial pain or pressure, ear pain or pressure, fever, chills.  He is drinking 2 bottles of green tea and 2 supplement drinks (boost or ensure) per day.  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  Past Medical History:  Diagnosis Date  . Anxiety   . Arthritis   . BPH (benign prostatic hyperplasia)   . CHF (congestive heart failure) ([place])   . Chronic combined systolic and diastolic CHF, NYHA class 2 ([place]) 04/17/2015  . GERD (gastroesophageal reflux disease)   . Hypertension   . Stroke ([place])   . Symptomatic sinus bradycardia    PPM Dr. Rayann Heman, 01/13/16, STJ device    Allergies  Allergen Reactions  . Chocolate Other (See Comments)    Reaction:  Acid reflux     ROS General: Denies fever, chills, night sweats, changes in weight, changes in appetite HEENT: Denies headaches, ear pain, changes in vision   + mild sore throat when coughing, rhinorrhea CV: Denies CP, palpitations, SOB, orthopnea Pulm: Denies SOB, wheezing  +productive cough GI: Denies abdominal pain, nausea, vomiting, diarrhea, constipation GU: Denies dysuria, hematuria, frequency, vaginal discharge Msk: Denies muscle cramps, joint pains Neuro: Denies weakness, numbness, tingling Skin: Denies rashes, bruising Psych: Denies depression, anxiety, hallucinations     Objective:    Blood pressure 110/68, pulse 86, temperature 98.4 F (36.9 C), temperature source Oral, weight 151 lb (68.5 kg), SpO2 96 %.   Gen. Pleasant, well-nourished, in no distress, normal affect, hard of  hearing HEENT: [place]/AT, face symmetric, no scleral icterus, nares patent with clear drainage, pharynx with post nasal drainage,  no erythema or exudate.  R TM normal,  L canal occluded with cerumen,  L TM normal after irrigation. Lungs: occasional cough, no accessory muscle use, CTAB, no wheezes or rales Cardiovascular: RRR Neuro:  A&Ox3, CN II-XII intact, ambulates with a cane. Skin:  Warm, thin skin, small area of ecchymosis on R forearm.    Wt Readings from Last 3 Encounters:  06/13/18 151 lb (68.5 kg)  05/29/18 156 lb 4.8 oz (70.9 kg)  09/11/17 158 lb 9.6 oz (71.9 kg)    Lab Results  Component Value Date   WBC 4.2 05/29/2018   HGB 12.1 (L) 05/29/2018   HCT 36.4 (L) 05/29/2018   PLT 207.0 05/29/2018   GLUCOSE 98 05/29/2018   CHOL 125 03/03/2016   TRIG 59 03/03/2016   HDL 29 (L) 03/03/2016   LDLCALC 84 03/03/2016   ALT 19 02/01/2017   AST 20 02/01/2017   NA 141 05/29/2018   K 4.5 05/29/2018   CL 103 05/29/2018   CREATININE 1.75 (H) 05/29/2018   BUN 33 (H) 05/29/2018   CO2 28 05/29/2018   TSH 0.542 12/29/2015   INR 1.57 08/23/2016   HGBA1C 5.7 (H) 03/03/2016    Assessment/Plan:  Post-nasal drainage -discussed proper nasal spray use. -continue using flonase -consider a po allergy med such as allegra (half dose)  Cough -discussed likely 2/2 post nasal drainage from allergies.  Exam reassuring. -discussed using mucinex and increasing  po intake of water. -given RTC or ED precautions for worsening symptoms (fever, increased cough, fatigue, chills, confusion, etc)  Impacted cerumen of left ear -consent obtained, L ear irrigated, pt tolerated procedure well. -consider debrox ear gtts prn  Follow-up PRN  Grier Mitts, MD

## 2018-06-13 NOTE — Patient Instructions (Addendum)
You can try Mucinex for your cough.  It can be found over the counter at the drug store.  You should also remember to drink more water daily.  Continue taking your nose spray (Flonase) for your symptoms.  You can consider taking Allegra (half a pill) for your allergy symptoms if the flonase is not helping.  If your symptoms do not improve or become worse (fever, chills, decreased energy) please return to clinic or proceed to the Emergency Department.   Postnasal Drip Postnasal drip is the feeling of mucus going down the back of your throat. Mucus is a slimy substance that moistens and cleans your nose and throat, as well as the air pockets in face bones near your forehead and cheeks (sinuses). Small amounts of mucus pass from your nose and sinuses down the back of your throat all the time. This is normal. When you produce too much mucus or the mucus gets too thick, you can feel it. Some common causes of postnasal drip include:  Having more mucus because of: ? A cold or the flu. ? Allergies. ? Cold air. ? Certain medicines.  Having more mucus that is thicker because of: ? A sinus or nasal infection. ? Dry air. ? A food allergy.  Follow these instructions at home: Relieving discomfort  Gargle with a salt-water mixture 3-4 times a day or as needed. To make a salt-water mixture, completely dissolve -1 tsp of salt in 1 cup of warm water.  If the air in your home is dry, use a humidifier to add moisture to the air.  Use a saline spray or container (neti pot) to flush out the nose (nasal irrigation). These methods can help clear away mucus and keep the nasal passages moist. General instructions  Take over-the-counter and prescription medicines only as told by your health care provider.  Follow instructions from your health care provider about eating or drinking restrictions. You may need to avoid caffeine.  Avoid things that you know you are allergic to (allergens), like dust, mold, pollen,  pets, or certain foods.  Drink enough fluid to keep your urine pale yellow.  Keep all follow-up visits as told by your health care provider. This is important. Contact a health care provider if:  You have a fever.  You have a sore throat.  You have difficulty swallowing.  You have headache.  You have sinus pain.  You have a cough that does not go away.  The mucus from your nose becomes thick and is green or yellow in color.  You have cold or flu symptoms that last more than 10 days. Summary  Postnasal drip is the feeling of mucus going down the back of your throat.  If your health care provider approves, use nasal irrigation or a nasal spray 2?4 times a day.  Avoid things that you know you are allergic to (allergens), like dust, mold, pollen, pets, or certain foods. This information is not intended to replace advice given to you by your health care provider. Make sure you discuss any questions you have with your health care provider. Document Released: 03/26/2017 Document Revised: 03/26/2017 Document Reviewed: 03/26/2017 Elsevier Interactive Patient Education  [employer].

## 2018-06-13 NOTE — Telephone Encounter (Signed)
Last OV 05/29/2018   Last refilled 05/29/2018 disp 90 with 3 refills

## 2018-06-13 NOTE — Telephone Encounter (Signed)
Pt last seen 2017.Refill for eliquis was denied. Patients primary doctor is refilling.

## 2018-06-16 ENCOUNTER — Other Ambulatory Visit: Payer: Self-pay | Admitting: Cardiovascular Disease

## 2018-06-17 NOTE — Telephone Encounter (Signed)
Rx request sent to pharmacy.  

## 2018-06-23 ENCOUNTER — Other Ambulatory Visit: Payer: Self-pay | Admitting: Cardiovascular Disease

## 2018-06-24 NOTE — Telephone Encounter (Signed)
Rx(s) sent to pharmacy electronically.  

## 2018-07-04 ENCOUNTER — Ambulatory Visit (INDEPENDENT_AMBULATORY_CARE_PROVIDER_SITE_OTHER): Payer: Medicare HMO | Admitting: Cardiovascular Disease

## 2018-07-04 ENCOUNTER — Encounter: Payer: Self-pay | Admitting: Cardiovascular Disease

## 2018-07-04 VITALS — BP 114/62 | HR 65 | Ht 68.0 in | Wt 152.0 lb

## 2018-07-04 DIAGNOSIS — I4821 Permanent atrial fibrillation: Secondary | ICD-10-CM

## 2018-07-04 DIAGNOSIS — I441 Atrioventricular block, second degree: Secondary | ICD-10-CM | POA: Diagnosis not present

## 2018-07-04 DIAGNOSIS — Z950 Presence of cardiac pacemaker: Secondary | ICD-10-CM

## 2018-07-04 DIAGNOSIS — I2510 Atherosclerotic heart disease of native coronary artery without angina pectoris: Secondary | ICD-10-CM | POA: Diagnosis not present

## 2018-07-04 DIAGNOSIS — I5022 Chronic systolic (congestive) heart failure: Secondary | ICD-10-CM

## 2018-07-04 DIAGNOSIS — Z7901 Long term (current) use of anticoagulants: Secondary | ICD-10-CM | POA: Diagnosis not present

## 2018-07-04 DIAGNOSIS — G8194 Hemiplegia, unspecified affecting left nondominant side: Secondary | ICD-10-CM

## 2018-07-04 DIAGNOSIS — I482 Chronic atrial fibrillation: Secondary | ICD-10-CM

## 2018-07-04 DIAGNOSIS — N183 Chronic kidney disease, stage 3 unspecified: Secondary | ICD-10-CM

## 2018-07-04 DIAGNOSIS — I10 Essential (primary) hypertension: Secondary | ICD-10-CM

## 2018-07-04 NOTE — Progress Notes (Signed)
Patient ID: Micheal Nicholson, male   DOB: 14-Aug-1920, 84 y.o.   MRN: [id number]    Cardiology Office Note    Date:  07/04/2018   ID:  Micheal Nicholson, DOB 1920-09-24, MRN [id number]  PCP:  Micheal Post, MD  Cardiologist:   Micheal Klein, MD   No chief complaint on file.   History of Present Illness:  Micheal Nicholson is a 84 y.o. male with persistent atrial fibrillation and residual left hemiparesis due to embolic stroke, currently on treatment with Eliquis. He has a long-standing history of conduction abnormalities including trifascicular block and symptomatic sinus bradycardia for which she received a [employer] single-chamber permanent pacemaker in January 2017 (Dr. Rayann Heman).  He is becoming more elderly and frail, but remains alert and fairly independent.  He has stopped driving since he lacks depth perception.  Hears well, but has some problems with his receptive aphasia ever since his stroke.  No new neurological complaints.  Moves slowly due to joint issues.  Denies shortness of breath with activity.  He continues to enjoy riding his lawnmower which he finds very relaxing.  After cutting back his diu has occasional orthostatic lightheadedness, but this is also less of a problem.  On the lower diuretic dose he has had fewer episodes of "weak legs" and he has not had any falls.  He denies any bleeding problems on Eliquis.  Has occasional problems with leg edema, but denies orthopnea and PND.  Complains of occasional discomfort in his lower left rib cage, pointing mostly towards his left flank.  This is not exertional.  Pacemaker interrogation shows normal generator and lead parameters.  The device is no close to beginning of life.  He has 86% ventricular pacing and have been no episodes of high ventricular rate.  Device records of his activity level showed he still moves around just under an hour day, every day.  Prior to atrial fibrillation pacemaker implantation he had  cardiomyopathy of unknown etiology. He had an ejection fraction of around 45% in 2006 and only had some minor stenoses in the proximal to mid LAD artery. In 2013 his ejection fraction was described also at 45% with inferior wall hypokinesis and mild right ventricular dilatation. In 2015, when atrial fibrillation was diagnosed, his echo showed an ejection fraction of 30-35%. During this recent hospitalization for stroke his ejection fraction was estimated to be 25-30 %. His most recent nuclear stress test showed a small apical inferolateral fixed defect without ischemia, much too small to explain his cardiomyopathy.  CT of the chest in March raised concern for nonspecific lymphadenopathy and splenomegaly in the aortopulmonary window. Workup deferred in view of his advanced age.  Past Medical History:  Diagnosis Date  . Anxiety   . Arthritis   . BPH (benign prostatic hyperplasia)   . CHF (congestive heart failure) ([place])   . Chronic combined systolic and diastolic CHF, NYHA class 2 ([place]) 04/17/2015  . GERD (gastroesophageal reflux disease)   . Hypertension   . Stroke ([place])   . Symptomatic sinus bradycardia    PPM Dr. Rayann Heman, 01/13/16, STJ device    Past Surgical History:  Procedure Laterality Date  . CARDIAC CATHETERIZATION  2006   Negative / 4 years ago  . EP IMPLANTABLE DEVICE N/A 01/13/2016   Procedure: Pacemaker Implant;  Surgeon: Thompson Grayer, MD;  Location: [facility];  Service: Cardiovascular;  Laterality: N/A;    Current Medications: Outpatient Medications Prior to Visit  Medication Sig Dispense  Refill  . ALPRAZolam (XANAX) 0.5 MG tablet TAKE 1 TABLET BY MOUTH TWICE A DAY AS NEEDED FOR ANXIETY 180 tablet 1  . bisacodyl (DULCOLAX) 5 MG EC tablet Take 5 mg by mouth daily as needed for moderate constipation.    . carvedilol (COREG) 6.25 MG tablet TAKE 1 TABLET BY MOUTH 2 TIMES DAILY WITH A MEAL. 180 tablet 3  . Cholecalciferol (VITAMIN D PO) Take 1 tablet by mouth daily with  breakfast.    . ELIQUIS 5 MG TABS tablet Take 1 tablet (5 mg total) by mouth 2 (two) times daily. 90 tablet 3  . fluticasone (FLONASE) 50 MCG/ACT nasal spray Place 2 sprays into both nostrils 2 (two) times daily as needed for rhinitis.     . furosemide (LASIX) 40 MG tablet Take 1 tablet (40 mg total) by mouth daily. MUST KEEP APPOINTMENT 07/04/18 WITH DR Jiovani Mccammon FOR FUTURE REFILLS 30 tablet 0  . hydrocortisone (CORTIZONE-10) 1 % ointment Apply 1 application topically 2 (two) times daily as needed for itching.    Marland Kitchen lisinopril (PRINIVIL,ZESTRIL) 5 MG tablet TAKE 1 TABLET DAILY 90 tablet 2  . omeprazole (PRILOSEC) 20 MG capsule Take 1 capsule (20 mg total) by mouth daily as needed (for acid reflex). 90 capsule 3  . potassium chloride (KLOR-CON M10) 10 MEQ tablet Take 1 tablet (10 mEq total) by mouth daily. Please schedule appointment with Dr. [name] for refills. 90 tablet 0  . pramipexole (MIRAPEX) 0.125 MG tablet TAKE 2 TABLETS BY MOUTH AT NIGHT FOR RESTLESS LEGS 180 tablet 0  . senna-docusate (SENOKOT-S) 8.6-50 MG tablet Take 1 tablet by mouth at bedtime as needed for mild constipation. 10 tablet 0  . sertraline (ZOLOFT) 100 MG tablet TAKE 1/2 TABLET DAILY NEEDS OFFICE VISIT 45 tablet 3  . tamsulosin (FLOMAX) 0.4 MG CAPS capsule TAKE 1 CAPSULE (0.4 MG TOTAL) BY MOUTH DAILY. 90 capsule 1  . traMADol (ULTRAM) 50 MG tablet Take 1 tablet (50 mg total) by mouth every 6 (six) hours as needed. 90 tablet 2  . Triamcinolone Acetonide (TRIAMCINOLONE 0.1 % CREAM : EUCERIN) CREA Apply 1 application topically 2 (two) times daily as needed for rash, itching or irritation.     No facility-administered medications prior to visit.      Allergies:   Chocolate   Social History   Socioeconomic History  . Marital status: Widowed    Spouse name: Not on file  . Number of children: Not on file  . Years of education: Not on file  . Highest education level: Not on file  Occupational History  . Not on file    Social Needs  . Financial resource strain: Not on file  . Food insecurity:    Worry: Not on file    Inability: Not on file  . Transportation needs:    Medical: Not on file    Non-medical: Not on file  Tobacco Use  . Smoking status: Former Smoker    Packs/day: 0.50    Years: 5.00    Pack years: 2.50  . Smokeless tobacco: Never Used  Substance and Sexual Activity  . Alcohol use: Yes    Alcohol/week: 0.6 oz    Types: 1 Shots of liquor per week    Comment: occ  . Drug use: No  . Sexual activity: Not on file  Lifestyle  . Physical activity:    Days per week: Not on file    Minutes per session: Not on file  . Stress: Not on file  Relationships  . Social connections:    Talks on phone: Not on file    Gets together: Not on file    Attends religious service: Not on file    Active member of club or organization: Not on file    Attends meetings of clubs or organizations: Not on file    Relationship status: Not on file  Other Topics Concern  . Not on file  Social History Narrative  . Not on file     Family History:  The patient's family history includes Healthy in his son; Heart disease in his brother; Heart failure in his son; Lung cancer in his brother; Pancreatic cancer in his son; Pneumonia (age of onset: 32) in his father.   ROS:   Please see the history of present illness.    ROS All other systems reviewed and are negative.   PHYSICAL EXAM:   VS:  BP 114/62   Pulse 65   Ht 5\' 8"  (1.727 m)   Wt 152 lb (68.9 kg)   BMI 23.11 kg/m     General: Alert, oriented x3, no distress, appears elderly and frail Head: no evidence of trauma, PERRL, EOMI, no exophtalmos or lid lag, no myxedema, no xanthelasma; normal ears, nose and oropharynx Neck: normal jugular venous pulsations and no hepatojugular reflux; brisk carotid pulses without delay and no carotid bruits Chest: clear to auscultation, no signs of consolidation by percussion or palpation, normal fremitus, symmetrical and  full respiratory excursions.  Healthy left subclavian pacer site Cardiovascular: normal position and quality of the apical impulse, regular rhythm, normal first and paradoxically split second heart sounds, no murmurs, rubs or gallops Abdomen: no tenderness or distention, no masses by palpation, no abnormal pulsatility or arterial bruits, normal bowel sounds, no hepatosplenomegaly Extremities: no clubbing, cyanosis or edema; 2+ radial, ulnar and brachial pulses bilaterally; 2+ right femoral, posterior tibial and dorsalis pedis pulses; 2+ left femoral, posterior tibial and dorsalis pedis pulses; no subclavian or femoral bruits Neurological: mild left hemiparesis Psych: Normal mood and affect  Wt Readings from Last 3 Encounters:  07/04/18 152 lb (68.9 kg)  06/13/18 151 lb (68.5 kg)  05/29/18 156 lb 4.8 oz (70.9 kg)      Studies/Labs Reviewed:   EKG:  EKG is ordered today.  It shows atrial fibrillation with mostly ventricular paced beats. Recent Labs: 05/29/2018: BUN 33; Creatinine, Ser 1.75; Hemoglobin 12.1; Platelets 207.0; Potassium 4.5; Sodium 141   Lipid Panel    Component Value Date/Time   CHOL 125 03/03/2016 0633   TRIG 59 03/03/2016 0633   HDL 29 (L) 03/03/2016 0633   CHOLHDL 4.3 03/03/2016 0633   VLDL 12 03/03/2016 0633   LDLCALC 84 03/03/2016 0633    ASSESSMENT:    1. Permanent atrial fibrillation ([place])   2. Second degree Mobitz II AV block   3. Cardiac pacemaker in situ   4. Essential hypertension   5. Coronary artery disease involving native coronary artery of native heart without angina pectoris   6. Chronic systolic congestive heart failure ([place])   7. Left hemiparesis ([place])   8. Long term current use of anticoagulant therapy   9. CKD (chronic kidney disease), stage III ([facility])      PLAN:  In order of problems listed above:  1. AFib: High ventricular rates are not an issue.  No new neurological or other embolic events. On Eliquis. CHADSVasc 7 (age 76, CVA 2, CAD,  HTN, CHF) 2. AV block: Requires frequent pacing. Prior to permanent  atrial fibrillation he had evidence of trifascicular block.  Fortunately, ventricular pacing does not seem to have had a major adverse impact with his depressed left ventricular systolic function. Electrophysiology evaluation was performed and the decision was made to proceed with single chamber pacing January. 3. PPM: Normal function of single lead pacemaker. Remote download 3 months, at least yearly office visit. 4. HTN: Good blood pressure control. 5. CAD: He has always had minor abnormalities on cath and nuclear studies and has never complained of angina pectoris. His cardiomyopathy appears to be nonischemic 6. CHF: No clinical evidence of hypervolemia.  He moves very slowly and is difficult to assess his functional status, but his activity level is completely unchanged compared to a year ago.  Shortness of breath is not a big complaint.  He is on ACE inhibitor's and beta-blockers 7. Left hemiparesis and receptive aphasia 8. Eliquis: Despite his frailty has not had falls and has not had any bleeding problems.  At this point, the benefits of stroke prevention appear to outweigh the risk of bleeding.  9. CKD: New creatinine baseline seems to be around 1.7.  Will need to reduce his Eliquis to 2.5 mg twice daily dose.   Medication Adjustments/Labs and Tests Ordered: Current medicines are reviewed at length with the patient today.  Concerns regarding medicines are outlined above.  Medication changes, Labs and Tests ordered today are listed in the Patient Instructions below. Patient Instructions  Dr [name] recommends that you continue on your current medications as directed. Please refer to the Current Medication list given to you today.  Remote monitoring is used to monitor your Pacemaker or ICD from home. This monitoring reduces the number of office visits required to check your device to one time per year. It allows [place] to keep an  eye on the functioning of your device to ensure it is working properly. You are scheduled for a device check from home on Thursday, October 10th, 2019. You may send your transmission at any time that day. If you have a wireless device, the transmission will be sent automatically. After your physician reviews your transmission, you will receive a notification with your next transmission date.  To improve our patient care and to more adequately follow your device, [facility] has decided, as a practice, to start following each patient four times a year with your home monitor. This means that you may experience a remote appointment that is close to an in-office appointment with your physician. Your insurance will apply at the same rate as other remote monitoring transmissions.  Dr [name] recommends that you schedule a follow-up appointment in 12 months with a pacemaker check. You will receive a reminder letter in the mail two months in advance. If you don't receive a letter, please call our office to schedule the follow-up appointment.  If you need a refill on your cardiac medications before your next appointment, please call your pharmacy.    Signed, Micheal Klein, MD  07/04/2018 10:34 AM    [facility] [address], [place], [place]  [address] Phone: [phone]; Fax: [phone]

## 2018-07-04 NOTE — Patient Instructions (Signed)
Dr [name] recommends that you continue on your current medications as directed. Please refer to the Current Medication list given to you today.  Remote monitoring is used to monitor your Pacemaker or ICD from home. This monitoring reduces the number of office visits required to check your device to one time per year. It allows [place] to keep an eye on the functioning of your device to ensure it is working properly. You are scheduled for a device check from home on Thursday, October 10th, 2019. You may send your transmission at any time that day. If you have a wireless device, the transmission will be sent automatically. After your physician reviews your transmission, you will receive a notification with your next transmission date.  To improve our patient care and to more adequately follow your device, [facility] has decided, as a practice, to start following each patient four times a year with your home monitor. This means that you may experience a remote appointment that is close to an in-office appointment with your physician. Your insurance will apply at the same rate as other remote monitoring transmissions.  Dr [name] recommends that you schedule a follow-up appointment in 12 months with a pacemaker check. You will receive a reminder letter in the mail two months in advance. If you don't receive a letter, please call our office to schedule the follow-up appointment.  If you need a refill on your cardiac medications before your next appointment, please call your pharmacy.

## 2018-07-08 ENCOUNTER — Other Ambulatory Visit: Payer: Self-pay | Admitting: Family Medicine

## 2018-07-11 ENCOUNTER — Other Ambulatory Visit: Payer: Self-pay

## 2018-07-11 ENCOUNTER — Ambulatory Visit (INDEPENDENT_AMBULATORY_CARE_PROVIDER_SITE_OTHER): Payer: Medicare HMO | Admitting: Family Medicine

## 2018-07-11 ENCOUNTER — Encounter: Payer: Self-pay | Admitting: Family Medicine

## 2018-07-11 VITALS — BP 126/68 | HR 64 | Temp 98.0°F | Ht 68.0 in | Wt 149.5 lb

## 2018-07-11 DIAGNOSIS — R5383 Other fatigue: Secondary | ICD-10-CM | POA: Diagnosis not present

## 2018-07-11 DIAGNOSIS — J069 Acute upper respiratory infection, unspecified: Secondary | ICD-10-CM

## 2018-07-11 MED ORDER — APIXABAN 2.5 MG PO TABS: 2.5000 mg | ORAL_TABLET | Freq: Two times a day (BID) | ORAL | 3 refills | Status: DC

## 2018-07-11 NOTE — Progress Notes (Signed)
HPI:  Using dictation device. Unfortunately this device frequently misinterprets words/phrases.  Acute visit for fatigue/cough: -started a few days ago -symptoms: nasal congestion, cough, more tired and weak then usual -more tired then usual but still insisted on mowing his yard and not drinking enough fluids according to his son - had a few stumbles/minor fall they thinks is due to the heat and dehydration a few days ago -no fevers, thick sputum, SOB, CP today - though reports episode "chest congestion" a few days ago, vomiting, diarrhea  ROS: See pertinent positives and negatives per HPI.  Past Medical History:  Diagnosis Date  . Anxiety   . Arthritis   . BPH (benign prostatic hyperplasia)   . CHF (congestive heart failure) ([place])   . Chronic combined systolic and diastolic CHF, NYHA class 2 ([place]) 04/17/2015  . GERD (gastroesophageal reflux disease)   . Hypertension   . Stroke ([place])   . Symptomatic sinus bradycardia    PPM Dr. Rayann Heman, 01/13/16, STJ device    Past Surgical History:  Procedure Laterality Date  . CARDIAC CATHETERIZATION  2006   Negative / 4 years ago  . EP IMPLANTABLE DEVICE N/A 01/13/2016   Procedure: Pacemaker Implant;  Surgeon: Thompson Grayer, MD;  Location: [facility];  Service: Cardiovascular;  Laterality: N/A;    Family History  Problem Relation Age of Onset  . Pneumonia Father 69       died  . Lung cancer Brother   . Heart disease Brother   . Pancreatic cancer Son   . Healthy Son   . Heart failure Son     SOCIAL HX: see hpi   Current Outpatient Medications:  .  ALPRAZolam (XANAX) 0.5 MG tablet, TAKE 1 TABLET BY MOUTH TWICE A DAY AS NEEDED FOR ANXIETY, Disp: 180 tablet, Rfl: 1 .  bisacodyl (DULCOLAX) 5 MG EC tablet, Take 5 mg by mouth daily as needed for moderate constipation., Disp: , Rfl:  .  carvedilol (COREG) 6.25 MG tablet, TAKE 1 TABLET BY MOUTH 2 TIMES DAILY WITH A MEAL., Disp: 180 tablet, Rfl: 3 .  Cholecalciferol (VITAMIN D PO),  Take 1 tablet by mouth daily with breakfast., Disp: , Rfl:  .  ELIQUIS 5 MG TABS tablet, Take 1 tablet (5 mg total) by mouth 2 (two) times daily., Disp: 90 tablet, Rfl: 3 .  fluticasone (FLONASE) 50 MCG/ACT nasal spray, Place 2 sprays into both nostrils 2 (two) times daily as needed for rhinitis. , Disp: , Rfl:  .  furosemide (LASIX) 40 MG tablet, Take 1 tablet (40 mg total) by mouth daily. MUST KEEP APPOINTMENT 07/04/18 WITH DR CROITORU FOR FUTURE REFILLS, Disp: 30 tablet, Rfl: 0 .  hydrocortisone (CORTIZONE-10) 1 % ointment, Apply 1 application topically 2 (two) times daily as needed for itching., Disp: , Rfl:  .  lisinopril (PRINIVIL,ZESTRIL) 5 MG tablet, TAKE 1 TABLET DAILY, Disp: 90 tablet, Rfl: 2 .  omeprazole (PRILOSEC) 20 MG capsule, Take 1 capsule (20 mg total) by mouth daily as needed (for acid reflex)., Disp: 90 capsule, Rfl: 3 .  potassium chloride (KLOR-CON M10) 10 MEQ tablet, Take 1 tablet (10 mEq total) by mouth daily. Please schedule appointment with Dr. [name] for refills., Disp: 90 tablet, Rfl: 0 .  pramipexole (MIRAPEX) 0.125 MG tablet, TAKE 2 TABLETS BY MOUTH AT NIGHT FOR RESTLESS LEGS, Disp: 180 tablet, Rfl: 0 .  senna-docusate (SENOKOT-S) 8.6-50 MG tablet, Take 1 tablet by mouth at bedtime as needed for mild constipation., Disp: 10 tablet, Rfl:  0 .  sertraline (ZOLOFT) 100 MG tablet, TAKE 1/2 TABLET DAILY NEEDS OFFICE VISIT, Disp: 45 tablet, Rfl: 3 .  tamsulosin (FLOMAX) 0.4 MG CAPS capsule, TAKE 1 CAPSULE BY MOUTH EVERY DAY, Disp: 90 capsule, Rfl: 1 .  traMADol (ULTRAM) 50 MG tablet, Take 1 tablet (50 mg total) by mouth every 6 (six) hours as needed., Disp: 90 tablet, Rfl: 2 .  Triamcinolone Acetonide (TRIAMCINOLONE 0.1 % CREAM : EUCERIN) CREA, Apply 1 application topically 2 (two) times daily as needed for rash, itching or irritation., Disp: , Rfl:   EXAM:  Vitals:   07/11/18 1038  BP: 126/68  Pulse: 64  Temp: 98 F (36.7 C)  SpO2: 96%    Body mass index is 22.73  kg/m.  GENERAL: vitals reviewed and listed above, alert, oriented, appears well hydrated and in no acute distress  HEENT: atraumatic, conjunttiva clear, no obvious abnormalities on inspection of external nose and ears, normal appearance of ear canals and TMs, thick nasal congestion, mild post oropharyngeal erythema with PND, no tonsillar edema or exudate, no sinus TTP  NECK: no obvious masses on inspection  LUNGS: clear to auscultation bilaterally, no wheezes, rales or rhonchi, good air movement  CV: HRRR currently, no peripheral edema  MS: walks carefully with cane  PSYCH: pleasant and cooperative, no obvious depression or anxiety  ASSESSMENT AND PLAN:  Discussed the following assessment and plan:  Upper respiratory tract infection, unspecified type  Fatigue, unspecified type  -we discussed possible serious and likely etiologies, workup and treatment, treatment risks and return precautions -has upper resp findings on exam -VURI/heat/dehydration could all explain his symptoms - seems better on exam today -discussed symptomatic care UR symptoms, offered CXR to exclude LRI - they opted to hold off -advised adequate oral hydration and staying out to the heat until feels better -they opted for a recheck next week and agree to seek care sooner if worsening or new concerns given age and comorbidities  Patient Instructions  BEFORE YOU LEAVE: -follow up: 1 week  Plenty of water  Nasal saline 2 times daily for the nose  Take it east this next week - no work outside until feeling better  Cough lozenges if needed for the cough  I hope you are feeling better soon! Seek care promptly if your symptoms worsen, new concerns arise or you are not improving with treatment. See emergency care if any chest pain or difficulty breathing.      Lucretia Kern, DO

## 2018-07-11 NOTE — Patient Instructions (Signed)
BEFORE YOU LEAVE: -follow up: 1 week  Plenty of water  Nasal saline 2 times daily for the nose  Take it east this next week - no work outside until feeling better  Cough lozenges if needed for the cough  I hope you are feeling better soon! Seek care promptly if your symptoms worsen, new concerns arise or you are not improving with treatment. See emergency care if any chest pain or difficulty breathing.

## 2018-07-11 NOTE — Telephone Encounter (Signed)
After patient left his appointment last week, Dr C decided to decrease his Eliquis. CKD: New creatinine baseline seems to be around 1.7.  Will need to reduce his Eliquis to 2.5 mg twice daily dose  Called patient's son, Gene. Son verbalized understanding and agreed with plan.  Rx(s) sent to patient's preferred pharmacy electronically.

## 2018-07-15 ENCOUNTER — Ambulatory Visit: Payer: Medicare HMO | Admitting: Family Medicine

## 2018-07-17 ENCOUNTER — Other Ambulatory Visit: Payer: Self-pay | Admitting: Cardiovascular Disease

## 2018-07-17 NOTE — Telephone Encounter (Signed)
Rx request sent to pharmacy.  

## 2018-07-19 ENCOUNTER — Emergency Department (HOSPITAL_COMMUNITY): Payer: Medicare HMO

## 2018-07-19 ENCOUNTER — Ambulatory Visit (INDEPENDENT_AMBULATORY_CARE_PROVIDER_SITE_OTHER): Payer: Medicare HMO | Admitting: Family Medicine

## 2018-07-19 ENCOUNTER — Encounter: Payer: Self-pay | Admitting: Family Medicine

## 2018-07-19 ENCOUNTER — Inpatient Hospital Stay (HOSPITAL_COMMUNITY)
Admission: EM | Admit: 2018-07-19 | Discharge: 2018-07-24 | DRG: 193 | Disposition: A | Discharge status: Skilled Nursing Facility | Payer: Medicare HMO | Attending: Internal Medicine | Admitting: Internal Medicine

## 2018-07-19 ENCOUNTER — Other Ambulatory Visit: Payer: Self-pay

## 2018-07-19 ENCOUNTER — Encounter (HOSPITAL_COMMUNITY): Payer: Self-pay

## 2018-07-19 VITALS — BP 130/83 | HR 64 | Temp 97.9°F

## 2018-07-19 DIAGNOSIS — S80919A Unspecified superficial injury of unspecified knee, initial encounter: Secondary | ICD-10-CM | POA: Diagnosis not present

## 2018-07-19 DIAGNOSIS — N183 Chronic kidney disease, stage 3 (moderate): Secondary | ICD-10-CM | POA: Diagnosis present

## 2018-07-19 DIAGNOSIS — I5042 Chronic combined systolic (congestive) and diastolic (congestive) heart failure: Secondary | ICD-10-CM | POA: Diagnosis present

## 2018-07-19 DIAGNOSIS — R338 Other retention of urine: Secondary | ICD-10-CM | POA: Diagnosis present

## 2018-07-19 DIAGNOSIS — M25461 Effusion, right knee: Secondary | ICD-10-CM | POA: Diagnosis present

## 2018-07-19 DIAGNOSIS — F0150 Vascular dementia without behavioral disturbance: Secondary | ICD-10-CM | POA: Diagnosis present

## 2018-07-19 DIAGNOSIS — Y92008 Other place in unspecified non-institutional (private) residence as the place of occurrence of the external cause: Secondary | ICD-10-CM | POA: Diagnosis not present

## 2018-07-19 DIAGNOSIS — F419 Anxiety disorder, unspecified: Secondary | ICD-10-CM | POA: Diagnosis present

## 2018-07-19 DIAGNOSIS — M25562 Pain in left knee: Secondary | ICD-10-CM | POA: Diagnosis not present

## 2018-07-19 DIAGNOSIS — W19XXXA Unspecified fall, initial encounter: Secondary | ICD-10-CM

## 2018-07-19 DIAGNOSIS — R001 Bradycardia, unspecified: Secondary | ICD-10-CM | POA: Diagnosis present

## 2018-07-19 DIAGNOSIS — R410 Disorientation, unspecified: Secondary | ICD-10-CM

## 2018-07-19 DIAGNOSIS — Z7901 Long term (current) use of anticoagulants: Secondary | ICD-10-CM

## 2018-07-19 DIAGNOSIS — S8991XA Unspecified injury of right lower leg, initial encounter: Secondary | ICD-10-CM | POA: Diagnosis not present

## 2018-07-19 DIAGNOSIS — R627 Adult failure to thrive: Secondary | ICD-10-CM | POA: Diagnosis present

## 2018-07-19 DIAGNOSIS — M79604 Pain in right leg: Secondary | ICD-10-CM | POA: Diagnosis not present

## 2018-07-19 DIAGNOSIS — Z87891 Personal history of nicotine dependence: Secondary | ICD-10-CM

## 2018-07-19 DIAGNOSIS — S199XXA Unspecified injury of neck, initial encounter: Secondary | ICD-10-CM | POA: Diagnosis not present

## 2018-07-19 DIAGNOSIS — R531 Weakness: Secondary | ICD-10-CM

## 2018-07-19 DIAGNOSIS — R2689 Other abnormalities of gait and mobility: Secondary | ICD-10-CM | POA: Diagnosis not present

## 2018-07-19 DIAGNOSIS — G8929 Other chronic pain: Secondary | ICD-10-CM | POA: Diagnosis present

## 2018-07-19 DIAGNOSIS — R51 Headache: Secondary | ICD-10-CM | POA: Diagnosis not present

## 2018-07-19 DIAGNOSIS — I2510 Atherosclerotic heart disease of native coronary artery without angina pectoris: Secondary | ICD-10-CM | POA: Diagnosis present

## 2018-07-19 DIAGNOSIS — M25561 Pain in right knee: Secondary | ICD-10-CM

## 2018-07-19 DIAGNOSIS — R4020 Unspecified coma: Secondary | ICD-10-CM | POA: Diagnosis not present

## 2018-07-19 DIAGNOSIS — I130 Hypertensive heart and chronic kidney disease with heart failure and stage 1 through stage 4 chronic kidney disease, or unspecified chronic kidney disease: Secondary | ICD-10-CM | POA: Diagnosis not present

## 2018-07-19 DIAGNOSIS — I959 Hypotension, unspecified: Secondary | ICD-10-CM | POA: Diagnosis not present

## 2018-07-19 DIAGNOSIS — Z7401 Bed confinement status: Secondary | ICD-10-CM | POA: Diagnosis not present

## 2018-07-19 DIAGNOSIS — R079 Chest pain, unspecified: Secondary | ICD-10-CM | POA: Diagnosis not present

## 2018-07-19 DIAGNOSIS — Z8249 Family history of ischemic heart disease and other diseases of the circulatory system: Secondary | ICD-10-CM

## 2018-07-19 DIAGNOSIS — Y939 Activity, unspecified: Secondary | ICD-10-CM

## 2018-07-19 DIAGNOSIS — N401 Enlarged prostate with lower urinary tract symptoms: Secondary | ICD-10-CM | POA: Diagnosis present

## 2018-07-19 DIAGNOSIS — J189 Pneumonia, unspecified organism: Secondary | ICD-10-CM

## 2018-07-19 DIAGNOSIS — R5383 Other fatigue: Secondary | ICD-10-CM

## 2018-07-19 DIAGNOSIS — R296 Repeated falls: Secondary | ICD-10-CM

## 2018-07-19 DIAGNOSIS — I129 Hypertensive chronic kidney disease with stage 1 through stage 4 chronic kidney disease, or unspecified chronic kidney disease: Secondary | ICD-10-CM | POA: Diagnosis not present

## 2018-07-19 DIAGNOSIS — K219 Gastro-esophageal reflux disease without esophagitis: Secondary | ICD-10-CM | POA: Diagnosis present

## 2018-07-19 DIAGNOSIS — S79911A Unspecified injury of right hip, initial encounter: Secondary | ICD-10-CM | POA: Diagnosis not present

## 2018-07-19 DIAGNOSIS — R000 Tachycardia, unspecified: Secondary | ICD-10-CM | POA: Diagnosis not present

## 2018-07-19 DIAGNOSIS — M1990 Unspecified osteoarthritis, unspecified site: Secondary | ICD-10-CM | POA: Diagnosis present

## 2018-07-19 DIAGNOSIS — R0602 Shortness of breath: Secondary | ICD-10-CM

## 2018-07-19 DIAGNOSIS — S0990XA Unspecified injury of head, initial encounter: Secondary | ICD-10-CM | POA: Diagnosis not present

## 2018-07-19 DIAGNOSIS — M542 Cervicalgia: Secondary | ICD-10-CM | POA: Diagnosis not present

## 2018-07-19 DIAGNOSIS — Z66 Do not resuscitate: Secondary | ICD-10-CM | POA: Diagnosis not present

## 2018-07-19 DIAGNOSIS — E860 Dehydration: Secondary | ICD-10-CM | POA: Diagnosis not present

## 2018-07-19 DIAGNOSIS — Z79899 Other long term (current) drug therapy: Secondary | ICD-10-CM

## 2018-07-19 DIAGNOSIS — Z950 Presence of cardiac pacemaker: Secondary | ICD-10-CM | POA: Diagnosis not present

## 2018-07-19 DIAGNOSIS — R278 Other lack of coordination: Secondary | ICD-10-CM | POA: Diagnosis not present

## 2018-07-19 DIAGNOSIS — J181 Lobar pneumonia, unspecified organism: Secondary | ICD-10-CM | POA: Diagnosis not present

## 2018-07-19 DIAGNOSIS — I69354 Hemiplegia and hemiparesis following cerebral infarction affecting left non-dominant side: Secondary | ICD-10-CM | POA: Diagnosis not present

## 2018-07-19 DIAGNOSIS — M2550 Pain in unspecified joint: Secondary | ICD-10-CM | POA: Diagnosis not present

## 2018-07-19 DIAGNOSIS — W010XXA Fall on same level from slipping, tripping and stumbling without subsequent striking against object, initial encounter: Secondary | ICD-10-CM | POA: Diagnosis present

## 2018-07-19 DIAGNOSIS — G9341 Metabolic encephalopathy: Secondary | ICD-10-CM | POA: Diagnosis not present

## 2018-07-19 DIAGNOSIS — H9190 Unspecified hearing loss, unspecified ear: Secondary | ICD-10-CM | POA: Diagnosis present

## 2018-07-19 DIAGNOSIS — M6281 Muscle weakness (generalized): Secondary | ICD-10-CM | POA: Diagnosis not present

## 2018-07-19 DIAGNOSIS — I482 Chronic atrial fibrillation: Secondary | ICD-10-CM | POA: Diagnosis present

## 2018-07-19 DIAGNOSIS — R1311 Dysphagia, oral phase: Secondary | ICD-10-CM | POA: Diagnosis not present

## 2018-07-19 DIAGNOSIS — F411 Generalized anxiety disorder: Secondary | ICD-10-CM | POA: Diagnosis not present

## 2018-07-19 LAB — CBC
HEMATOCRIT: 35.2 % — AB (ref 39.0–52.0)
Hemoglobin: 11.2 g/dL — ABNORMAL LOW (ref 13.0–17.0)
MCH: 29.1 pg (ref 26.0–34.0)
MCHC: 31.8 g/dL (ref 30.0–36.0)
MCV: 91.4 fL (ref 78.0–100.0)
Platelets: 170 10*3/uL (ref 150–400)
RBC: 3.85 MIL/uL — ABNORMAL LOW (ref 4.22–5.81)
RDW: 14.9 % (ref 11.5–15.5)
WBC: 3.3 10*3/uL — ABNORMAL LOW (ref 4.0–10.5)

## 2018-07-19 LAB — CREATININE, SERUM
Creatinine, Ser: 1.47 mg/dL — ABNORMAL HIGH (ref 0.61–1.24)
GFR calc Af Amer: 45 mL/min — ABNORMAL LOW (ref >60)
GFR, EST NON AFRICAN AMERICAN: 38 mL/min — AB (ref >60)

## 2018-07-19 LAB — COMPREHENSIVE METABOLIC PANEL
ALBUMIN: 3.5 g/dL (ref 3.5–5.0)
ALK PHOS: 51 U/L (ref 38–126)
ALT: 15 U/L (ref 0–44)
ANION GAP: 10 (ref 5–15)
AST: 20 U/L (ref 15–41)
BUN: 24 mg/dL — ABNORMAL HIGH (ref 8–23)
CALCIUM: 8.9 mg/dL (ref 8.9–10.3)
CHLORIDE: 107 mmol/L (ref 98–111)
CO2: 23 mmol/L (ref 22–32)
Creatinine, Ser: 1.5 mg/dL — ABNORMAL HIGH (ref 0.61–1.24)
GFR calc non Af Amer: 38 mL/min — ABNORMAL LOW (ref >60)
GFR, EST AFRICAN AMERICAN: 43 mL/min — AB (ref >60)
GLUCOSE: 90 mg/dL (ref 70–99)
Potassium: 4.3 mmol/L (ref 3.5–5.1)
SODIUM: 140 mmol/L (ref 135–145)
Total Bilirubin: 1.7 mg/dL — ABNORMAL HIGH (ref 0.3–1.2)
Total Protein: 6.4 g/dL — ABNORMAL LOW (ref 6.5–8.1)

## 2018-07-19 LAB — CBC WITH DIFFERENTIAL/PLATELET
Abs Immature Granulocytes: 0 10*3/uL (ref 0.0–0.1)
BASOS ABS: 0 10*3/uL (ref 0.0–0.1)
BASOS PCT: 0 %
EOS ABS: 0.1 10*3/uL (ref 0.0–0.7)
EOS PCT: 2 %
HCT: 37.5 % — ABNORMAL LOW (ref 39.0–52.0)
Hemoglobin: 11.7 g/dL — ABNORMAL LOW (ref 13.0–17.0)
Immature Granulocytes: 0 %
Lymphocytes Relative: 14 %
Lymphs Abs: 0.5 10*3/uL — ABNORMAL LOW (ref 0.7–4.0)
MCH: 28.6 pg (ref 26.0–34.0)
MCHC: 31.2 g/dL (ref 30.0–36.0)
MCV: 91.7 fL (ref 78.0–100.0)
Monocytes Absolute: 0.3 10*3/uL (ref 0.1–1.0)
Monocytes Relative: 8 %
NEUTROS PCT: 76 %
Neutro Abs: 3 10*3/uL (ref 1.7–7.7)
PLATELETS: 183 10*3/uL (ref 150–400)
RBC: 4.09 MIL/uL — AB (ref 4.22–5.81)
RDW: 14.9 % (ref 11.5–15.5)
WBC: 4 10*3/uL (ref 4.0–10.5)

## 2018-07-19 LAB — BRAIN NATRIURETIC PEPTIDE: B Natriuretic Peptide: 748.8 pg/mL — ABNORMAL HIGH (ref 0.0–100.0)

## 2018-07-19 LAB — I-STAT CG4 LACTIC ACID, ED: LACTIC ACID, VENOUS: 1.85 mmol/L (ref 0.5–1.9)

## 2018-07-19 LAB — TROPONIN I

## 2018-07-19 MED ORDER — SODIUM CHLORIDE 0.9 % IV SOLN
INTRAVENOUS | Status: AC
  Administered 2018-07-19: 21:00:00 via INTRAVENOUS

## 2018-07-19 MED ORDER — PANTOPRAZOLE SODIUM 40 MG PO TBEC
40.0000 mg | DELAYED_RELEASE_TABLET | Freq: Every day | ORAL | Status: DC
Start: 2018-07-19 — End: 2018-07-25
  Administered 2018-07-20 – 2018-07-24 (×5): 40 mg via ORAL
  Filled 2018-07-19 (×5): qty 1

## 2018-07-19 MED ORDER — SODIUM CHLORIDE 0.9 % IV SOLN
500.0000 mg | Freq: Once | INTRAVENOUS | Status: AC
  Administered 2018-07-19: 500 mg via INTRAVENOUS
  Filled 2018-07-19: qty 500

## 2018-07-19 MED ORDER — APIXABAN 2.5 MG PO TABS
2.5000 mg | ORAL_TABLET | Freq: Two times a day (BID) | ORAL | Status: DC
  Administered 2018-07-19 – 2018-07-22 (×6): 2.5 mg via ORAL
  Filled 2018-07-19 (×6): qty 1

## 2018-07-19 MED ORDER — SODIUM CHLORIDE 0.9 % IV SOLN
1.0000 g | Freq: Once | INTRAVENOUS | Status: AC
  Administered 2018-07-19: 1 g via INTRAVENOUS
  Filled 2018-07-19: qty 10

## 2018-07-19 MED ORDER — DOCUSATE SODIUM 100 MG PO CAPS
100.0000 mg | ORAL_CAPSULE | Freq: Two times a day (BID) | ORAL | Status: DC
  Administered 2018-07-19 – 2018-07-24 (×10): 100 mg via ORAL
  Filled 2018-07-19 (×10): qty 1

## 2018-07-19 MED ORDER — HYDROXYZINE HCL 25 MG PO TABS
25.0000 mg | ORAL_TABLET | Freq: Once | ORAL | Status: AC
Start: 2018-07-19 — End: 2018-07-19
  Administered 2018-07-19: 25 mg via ORAL
  Filled 2018-07-19: qty 1

## 2018-07-19 MED ORDER — SODIUM CHLORIDE 0.9 % IV SOLN
1.0000 g | INTRAVENOUS | Status: DC
  Administered 2018-07-20: 1 g via INTRAVENOUS
  Filled 2018-07-19 (×2): qty 10

## 2018-07-19 MED ORDER — LISINOPRIL 5 MG PO TABS
5.0000 mg | ORAL_TABLET | Freq: Every day | ORAL | Status: DC
  Administered 2018-07-20 – 2018-07-24 (×5): 5 mg via ORAL
  Filled 2018-07-19 (×5): qty 1

## 2018-07-19 MED ORDER — DOXYCYCLINE HYCLATE 100 MG PO TABS
100.0000 mg | ORAL_TABLET | Freq: Two times a day (BID) | ORAL | Status: AC
  Administered 2018-07-20 – 2018-07-23 (×8): 100 mg via ORAL
  Filled 2018-07-19 (×8): qty 1

## 2018-07-19 MED ORDER — CARVEDILOL 3.125 MG PO TABS
3.1250 mg | ORAL_TABLET | Freq: Two times a day (BID) | ORAL | Status: DC
  Administered 2018-07-19 – 2018-07-24 (×10): 3.125 mg via ORAL
  Filled 2018-07-19 (×10): qty 1

## 2018-07-19 MED ORDER — SODIUM CHLORIDE 0.9 % IV BOLUS
1000.0000 mL | Freq: Once | INTRAVENOUS | Status: AC
  Administered 2018-07-19: 1000 mL via INTRAVENOUS

## 2018-07-19 MED ORDER — TAMSULOSIN HCL 0.4 MG PO CAPS
0.4000 mg | ORAL_CAPSULE | Freq: Every day | ORAL | Status: DC
  Administered 2018-07-20 – 2018-07-21 (×2): 0.4 mg via ORAL
  Filled 2018-07-19 (×2): qty 1

## 2018-07-19 MED ORDER — SENNOSIDES-DOCUSATE SODIUM 8.6-50 MG PO TABS: 1.0000 | ORAL_TABLET | Freq: Every evening | ORAL | Status: DC | PRN

## 2018-07-19 MED ORDER — BISACODYL 5 MG PO TBEC: 5.0000 mg | DELAYED_RELEASE_TABLET | Freq: Every day | ORAL | Status: DC | PRN

## 2018-07-19 MED ORDER — SERTRALINE HCL 25 MG PO TABS
25.0000 mg | ORAL_TABLET | Freq: Every day | ORAL | Status: DC
  Administered 2018-07-20 – 2018-07-24 (×5): 25 mg via ORAL
  Filled 2018-07-19 (×5): qty 1

## 2018-07-19 MED ORDER — ACETAMINOPHEN 325 MG PO TABS
325.0000 mg | ORAL_TABLET | Freq: Four times a day (QID) | ORAL | Status: DC | PRN
  Administered 2018-07-20 – 2018-07-21 (×4): 325 mg via ORAL
  Filled 2018-07-19 (×4): qty 1

## 2018-07-19 MED ORDER — PRAMIPEXOLE DIHYDROCHLORIDE 0.25 MG PO TABS
0.2500 mg | ORAL_TABLET | Freq: Every day | ORAL | Status: DC
  Administered 2018-07-20 – 2018-07-23 (×5): 0.25 mg via ORAL
  Filled 2018-07-19 (×7): qty 1

## 2018-07-19 NOTE — ED Notes (Signed)
Got patient undress on the monitor did ekg shown to Dr Breck Coons patient is resting with call bell in reach

## 2018-07-19 NOTE — ED Notes (Signed)
EDP at bedside  

## 2018-07-19 NOTE — ED Notes (Signed)
Patient transported to X-ray 

## 2018-07-19 NOTE — ED Notes (Addendum)
Pacemaker interrogated EDIT: Pacemaker normal per [facility]

## 2018-07-19 NOTE — H&P (Addendum)
History and Physical    DOA: 07/19/2018  PCP: [name], MD  Patient coming from: Home  Chief Complaint: Brought in by family after a fall  HPI: Micheal Nicholson is a 84 y.o. [employer] War [employer] veteran with history h/o atrial fibrillation, hypertension, combined systolic and diastolic heart failure, CAD, history of CVA, BPH, chronic kidney disease, chronic anxiety presents with recurrent falls.  Patient extremely somnolent but arousable.  Most of the history obtained from son bedside.  Patient apparently lives alone and until 2 weeks back was able to perform his ADLs independently .  He has mild memory issues and walks around with a cane for balance since he suffered a stroke 2 years back.  He had been using the ridermover and working on his lawn. Per son, patient was evaluated 1 week ago by PCP for dehydration and chest congestion.  He was advised to stay indoors and encourage oral fluid intake.  Per son patient likes green tea and has been drinking a lot recently.  Son lives close by and checks on patient frequently.  Patient was found on the side of the bed this morning when son went to check on him.  Patient apparently was lucid and reported to son that he fell on the deck last night when he bent to pick up something and lost balance.  He could not pick himself up and laid on the floor for few hours, subsequently crawled into the house at some point.  It is not clear if patient rolled off the bed or laid on the floor all night.  No report of urinary or bowel incontinence.  No report of any focal weakness although patient apparently has been complaining of right knee pain.  Per son patient did complain of lower chest wall pain as well as other joint pains/fatigue over the last week.  No report of melena, hematochezia, shortness of breath or chest pain.  Patient has had upper respiratory symptoms over the last few days with productive cough, yellow phlegm, no blood.  No report of fevers.   Gradual weight loss of 20 pounds over a year mostly due to loss of appetite and poor oral intake per son.  Patient has not complained about focal weakness, dysuria, fever or chills.  Work-up in the ED revealed labs at his baseline, mild interstitial edema and patchy right basilar opacity on chest x-ray, BNP of 748 (baseline around 500 in the setting of chronic kidney disease)     Review of Systems: As per HPI otherwise 10 point review of systems negative.    Past Medical History:  Diagnosis Date  . Anxiety   . Arthritis   . BPH (benign prostatic hyperplasia)   . CHF (congestive heart failure) ([place])   . Chronic combined systolic and diastolic CHF, NYHA class 2 ([place]) 04/17/2015  . GERD (gastroesophageal reflux disease)   . Hypertension   . Stroke ([place])   . Symptomatic sinus bradycardia    PPM Dr. Rayann Heman, 01/13/16, STJ device    Past Surgical History:  Procedure Laterality Date  . CARDIAC CATHETERIZATION  2006   Negative / 4 years ago  . EP IMPLANTABLE DEVICE N/A 01/13/2016   Procedure: Pacemaker Implant;  Surgeon: Thompson Grayer, MD;  Location: [facility];  Service: Cardiovascular;  Laterality: N/A;    Social history:  reports that he has quit smoking. He has a 2.50 pack-year smoking history. He has never used smokeless tobacco. He reports that he drinks about 0.6  oz of alcohol per week. He reports that he does not use drugs.   Allergies  Allergen Reactions  . Chocolate Other (See Comments)    Acid reflux     Family History  Problem Relation Age of Onset  . Pneumonia Father 62       died  . Lung cancer Brother   . Heart disease Brother   . Pancreatic cancer Son   . Healthy Son   . Heart failure Son       Prior to Admission medications   Medication Sig Start Date End Date Taking? Authorizing Provider  acetaminophen (TYLENOL) 325 MG tablet Take 325 mg by mouth every 6 (six) hours as needed for mild pain or headache.   Yes [provider]  ALPRAZolam  (XANAX) 0.5 MG tablet TAKE 1 TABLET BY MOUTH TWICE A DAY AS NEEDED FOR ANXIETY Patient taking differently: Take 0.5 mg by mouth See admin instructions. Take 0.5 mg by mouth two times a day- morning and bedtime 05/29/18  Yes Burchette, Alinda Sierras, MD  apixaban (ELIQUIS) 5 MG TABS tablet Take 5 mg by mouth 2 (two) times daily.   Yes [provider]  carvedilol (COREG) 6.25 MG tablet TAKE 1 TABLET BY MOUTH 2 TIMES DAILY WITH A MEAL. 05/29/18  Yes Burchette, Alinda Sierras, MD  docusate sodium (COLACE) 100 MG capsule Take 100 mg by mouth 2 (two) times daily.   Yes [provider]  fluticasone (FLONASE) 50 MCG/ACT nasal spray Place 2 sprays into both nostrils 2 (two) times daily as needed for rhinitis.    Yes [provider]  furosemide (LASIX) 40 MG tablet PLEASE SEE ATTACHED FOR DETAILED DIRECTIONS Patient taking differently: Take 40 mg by mouth in the morning 07/17/18  Yes Croitoru, Mihai, MD  lisinopril (PRINIVIL,ZESTRIL) 5 MG tablet TAKE 1 TABLET DAILY 01/21/18  Yes Burchette, Alinda Sierras, MD  potassium chloride (KLOR-CON M10) 10 MEQ tablet Take 1 tablet (10 mEq total) by mouth daily. Please schedule appointment with Dr. [name] for refills. 06/17/18  Yes Croitoru, Mihai, MD  pramipexole (MIRAPEX) 0.125 MG tablet TAKE 2 TABLETS BY MOUTH AT NIGHT FOR RESTLESS LEGS 05/01/18  Yes Burchette, Alinda Sierras, MD  sertraline (ZOLOFT) 100 MG tablet TAKE 1/2 TABLET DAILY NEEDS OFFICE VISIT Patient taking differently: Take 50 mg by mouth every morning. TAKE 1/2 TABLET DAILY NEEDS OFFICE VISIT 05/29/18  Yes Burchette, Alinda Sierras, MD  tamsulosin (FLOMAX) 0.4 MG CAPS capsule TAKE 1 CAPSULE BY MOUTH EVERY DAY 07/08/18  Yes Burchette, Alinda Sierras, MD  apixaban (ELIQUIS) 2.5 MG TABS tablet Take 1 tablet (2.5 mg total) by mouth 2 (two) times daily. 07/11/18   Croitoru, Mihai, MD  bisacodyl (DULCOLAX) 5 MG EC tablet Take 5 mg by mouth daily as needed for moderate constipation.    [provider]  hydrocortisone  (CORTIZONE-10) 1 % ointment Apply 1 application topically 2 (two) times daily as needed for itching.    [provider]  omeprazole (PRILOSEC) 20 MG capsule Take 1 capsule (20 mg total) by mouth daily as needed (for acid reflex). 11/24/16   Burchette, Alinda Sierras, MD  senna-docusate (SENOKOT-S) 8.6-50 MG tablet Take 1 tablet by mouth at bedtime as needed for mild constipation. 03/06/16   Dhungel, Flonnie Overman, MD  traMADol (ULTRAM) 50 MG tablet Take 1 tablet (50 mg total) by mouth every 6 (six) hours as needed. 05/29/18   Burchette, Alinda Sierras, MD  Triamcinolone Acetonide (TRIAMCINOLONE 0.1 % CREAM : EUCERIN) CREA Apply 1 application topically  2 (two) times daily as needed for rash, itching or irritation.    [provider]    Physical Exam: Vitals:   07/19/18 1330 07/19/18 1345 07/19/18 1445 07/19/18 1515  BP: 125/67 120/62 133/76 119/71  Pulse: (!) 130 89 (!) 46 62  Resp: 16 16 14 11   Temp:      TempSrc:      SpO2: 100% 100% 98% 98%  Weight:      Height:        Constitutional: NAD, calm, comfortable Vitals:   07/19/18 1330 07/19/18 1345 07/19/18 1445 07/19/18 1515  BP: 125/67 120/62 133/76 119/71  Pulse: (!) 130 89 (!) 46 62  Resp: 16 16 14 11   Temp:      TempSrc:      SpO2: 100% 100% 98% 98%  Weight:      Height:      Gen: Elderly patient laying in stretcher , in mild discomfort due to back pain/uncomfortable on the stretcher. Appears somnolent.  Eyes: PERRL, lids and conjunctivae normal ENMT: Mucous membranes are dry. Posterior pharynx clear of any exudate or lesions.Normal dentition.  Neck: normal, supple, no masses, no thyromegaly Respiratory: clear to auscultation bilaterally, no wheezing, no crackles. Normal respiratory effort. No accessory muscle use.  Cardiovascular: Regular rate and rhythm, no murmurs / rubs / gallops. No extremity edema.Intact pedal pulses. Abdomen: no tenderness, no masses palpated. No hepatosplenomegaly. Bowel sounds positive.  Musculoskeletal:  no clubbing / cyanosis. No joint deformity upper and lower extremities. No rib tenderness Neurologic: Somnolent. Could not assess in detail but patient was able to recall his name and his son's name.  He knew he is in a hospital but could not tell me the name.  He is able to move all extremities.  No obvious facial droop or speech abnormality noted Psychiatric: Could not assess in detail but appears to be in good spirit SKIN/catheters: Abrasion on right shin with scab. Labs on Admission: I have personally reviewed following labs and imaging studies  CBC: Recent Labs  Lab 07/19/18 1335  WBC 4.0  NEUTROABS 3.0  HGB 11.7*  HCT 37.5*  MCV 91.7  PLT 462   Basic Metabolic Panel: Recent Labs  Lab 07/19/18 1335  NA 140  K 4.3  CL 107  CO2 23  GLUCOSE 90  BUN 24*  CREATININE 1.50*  CALCIUM 8.9   GFR: Estimated Creatinine Clearance: 26.9 mL/min (A) (by C-G formula based on SCr of 1.5 mg/dL (H)). Liver Function Tests: Recent Labs  Lab 07/19/18 1335  AST 20  ALT 15  ALKPHOS 51  BILITOT 1.7*  PROT 6.4*  ALBUMIN 3.5   No results for input(s): LIPASE, AMYLASE in the last 168 hours. No results for input(s): AMMONIA in the last 168 hours. Coagulation Profile: No results for input(s): INR, PROTIME in the last 168 hours. Cardiac Enzymes: Recent Labs  Lab 07/19/18 1335  TROPONINI <0.03   BNP (last 3 results) No results for input(s): PROBNP in the last 8760 hours. HbA1C: No results for input(s): HGBA1C in the last 72 hours. CBG: No results for input(s): GLUCAP in the last 168 hours. Lipid Profile: No results for input(s): CHOL, HDL, LDLCALC, TRIG, CHOLHDL, LDLDIRECT in the last 72 hours. Thyroid Function Tests: No results for input(s): TSH, T4TOTAL, FREET4, T3FREE, THYROIDAB in the last 72 hours. Anemia Panel: No results for input(s): VITAMINB12, FOLATE, FERRITIN, TIBC, IRON, RETICCTPCT in the last 72 hours. Urine analysis:    Component Value Date/Time   COLORURINE  STRAW (  A) 02/01/2017 [place] 02/01/2017 1645   LABSPEC 1.008 02/01/2017 1645   PHURINE 7.0 02/01/2017 [place] 02/01/2017 1645   HGBUR NEGATIVE 02/01/2017 1645   BILIRUBINUR NEGATIVE 02/01/2017 1645   BILIRUBINUR negative 11/10/2013 1713   [place] 02/01/2017 1645   PROTEINUR NEGATIVE 02/01/2017 1645   UROBILINOGEN 1.0 03/14/2015 1608   NITRITE NEGATIVE 02/01/2017 1645   LEUKOCYTESUR NEGATIVE 02/01/2017 1645    Radiological Exams on Admission: Dg Chest 1 View  Result Date: 07/19/2018 CLINICAL DATA:  Shortness of breath. Increased weakness and falls for 1 week. Atrial fibrillation. EXAM: CHEST  1 VIEW COMPARISON:  02/01/2017 FINDINGS: A single lead pacemaker remains in place. The cardiac silhouette remains moderately to prominently enlarged. There is aortic atherosclerosis. There is mild pulmonary vascular congestion and mild diffuse interstitial prominence, overall slightly less than on the prior study. Asymmetric patchy opacity is present in the right lung base. No sizable pleural effusion or pneumothorax is identified. Thoracolumbar scoliosis and old left rib fractures are noted. IMPRESSION: 1. Cardiomegaly and mild interstitial edema, slightly improved from the prior study. 2. Patchy right basilar opacity could reflect superimposed pneumonia. Electronically Signed   By: Logan Bores M.D.   On: 07/19/2018 14:17   Ct Head Wo Contrast  Result Date: 07/19/2018 CLINICAL DATA:  Pain after trauma. EXAM: CT HEAD WITHOUT CONTRAST CT CERVICAL SPINE WITHOUT CONTRAST TECHNIQUE: Multidetector CT imaging of the head and cervical spine was performed following the standard protocol without intravenous contrast. Multiplanar CT image reconstructions of the cervical spine were also generated. COMPARISON:  February 01, 2017 FINDINGS: CT HEAD FINDINGS Brain: No subdural, epidural, or subarachnoid hemorrhage. The ventricles and sulci are stable and unremarkable.  Low-attenuation in the left cerebellar hemisphere on series 4, image 6 is noted to be within a sulcus on coronal imaging. The cerebellum is unremarkable. Cerebellum is otherwise unremarkable. The brainstem and basal cisterns are normal. No mass effect or midline shift. There is an infarct involving the white matter of the right frontal parietal region and a small portion of overlying cortex, unchanged. Scattered white matter changes are identified. A lacunar infarct in the left external capsule on image 19 is stable. Vascular: No hyperdense vessel or unexpected calcification. Skull: Normal. Negative for fracture or focal lesion. Sinuses/Orbits: No acute finding. Other: None. CT CERVICAL SPINE FINDINGS Alignment: Anterolisthesis C7 versus T1 measuring 3 mm today is unchanged since February 2018. No other malalignment. Skull base and vertebrae: No fractures are seen Soft tissues and spinal canal: No prevertebral fluid or swelling. No visible canal hematoma. Disc levels:  Multilevel degenerative changes. Upper chest: Negative. Other: No other abnormalities. IMPRESSION: 1. No acute intracranial abnormality. 2. No fracture or acute traumatic malalignment in the cervical spine. Electronically Signed   By: Dorise Bullion III M.D   On: 07/19/2018 15:19   Ct Cervical Spine Wo Contrast  Result Date: 07/19/2018 CLINICAL DATA:  Pain after trauma. EXAM: CT HEAD WITHOUT CONTRAST CT CERVICAL SPINE WITHOUT CONTRAST TECHNIQUE: Multidetector CT imaging of the head and cervical spine was performed following the standard protocol without intravenous contrast. Multiplanar CT image reconstructions of the cervical spine were also generated. COMPARISON:  February 01, 2017 FINDINGS: CT HEAD FINDINGS Brain: No subdural, epidural, or subarachnoid hemorrhage. The ventricles and sulci are stable and unremarkable. Low-attenuation in the left cerebellar hemisphere on series 4, image 6 is noted to be within a sulcus on coronal imaging. The  cerebellum is unremarkable. Cerebellum is otherwise unremarkable. The  brainstem and basal cisterns are normal. No mass effect or midline shift. There is an infarct involving the white matter of the right frontal parietal region and a small portion of overlying cortex, unchanged. Scattered white matter changes are identified. A lacunar infarct in the left external capsule on image 19 is stable. Vascular: No hyperdense vessel or unexpected calcification. Skull: Normal. Negative for fracture or focal lesion. Sinuses/Orbits: No acute finding. Other: None. CT CERVICAL SPINE FINDINGS Alignment: Anterolisthesis C7 versus T1 measuring 3 mm today is unchanged since February 2018. No other malalignment. Skull base and vertebrae: No fractures are seen Soft tissues and spinal canal: No prevertebral fluid or swelling. No visible canal hematoma. Disc levels:  Multilevel degenerative changes. Upper chest: Negative. Other: No other abnormalities. IMPRESSION: 1. No acute intracranial abnormality. 2. No fracture or acute traumatic malalignment in the cervical spine. Electronically Signed   By: Dorise Bullion III M.D   On: 07/19/2018 15:19   Dg Knee Complete 4 Views Right  Result Date: 07/19/2018 CLINICAL DATA:  Leg pain after fall last night. EXAM: RIGHT KNEE - COMPLETE 4+ VIEW COMPARISON:  04/22/2007 FINDINGS: There is no fracture or dislocation. There is a prominent joint effusion in the suprapatellar recess. Calcific tendinopathy of the distal quadriceps tendon. Tiny marginal osteophyte on the upper pole the patella. Slight medial joint space narrowing. IMPRESSION: New joint effusion.  No appreciable acute bone abnormality. Electronically Signed   By: Lorriane Shire M.D.   On: 07/19/2018 14:16   Dg Hip Unilat W Or Wo Pelvis 2-3 Views Right  Result Date: 07/19/2018 CLINICAL DATA:  Right leg pain secondary to a fall at home last night. EXAM: DG HIP (WITH OR WITHOUT PELVIS) 2-3V RIGHT COMPARISON:  Radiographs dated  02/01/2017 FINDINGS: There is no evidence of hip fracture or dislocation. There is no evidence of arthropathy or other focal bone abnormality. Diffuse osteopenia. Minimal medial joint space narrowing, chronic. IMPRESSION: No acute abnormalities. Electronically Signed   By: Lorriane Shire M.D.   On: 07/19/2018 14:18    EKG: Independently reviewed.  A. fib with PVCs   Assessment and Plan:   1.  Right lower lobe pneumonia: Patient does not have fever or leukocytosis.  He received Rocephin and Zithromax in the ED.  Will continue empiric antibiotics for community-acquired pneumonia in this elderly patient.  Given right-sided infiltrate, will obtain swallow evaluation to rule out aspiration.  Could be secondary to atelectasis in the setting of rib pain and poor inspiratory effort.  Incentive spirometry ordered.  Will send blood cultures if spikes fevers  2.Recurrent falls/metabolic encephalopathy: Secondary to problem #1 versus dehydration.  Patient did receive a liter of IV fluids in the ED.  Given history of CHF, elevated BNP and chest x-ray suggestive of mild pulmonary congestion, will avoid aggressive hydration.  His kidney function is at his baseline.  Check UA to rule out UTI.   Patient is on multiple sedatives.  Resume lower dose of Zoloft and hold benzodiazepines/opiates in this elderly patient with recurrent falls.Hold diuretics. PT evaluation requested, may need short-term rehab and son okay with that option.   3.  History of CVA: Patient on anticoagulation.  Uses cane at baseline to ambulate and has chronic ataxia/hearing issues and memory issues since his stroke  4.  Chronic atrial fibrillation: Resume home medications including anticoagulation.  Person Eliquis dose was recently reduced to half a pill twice daily.  Consulted cardiology to reevaluate his risk and need for continued anticoagulation.   5.  Chronic kidney disease Stage 3: Baseline creatinine around 1.6-1.7.  His labs appear to be  at baseline today. Ginger IV hydration overnight and hold diuretics for now. He does have chronic BNP elevation in setting of CKD.   DVT prophylaxis: on Eliquis  Code Status: DNR  Family Communication: Discussed  in detail with son bedside who is a healthcare proxy.  All questions answered and he is agreeable for admission and plan as described above Consults called: Epic message sent to cardiology APP Gay Filler.  Admission status:  Patient admitted as inpatient status as anticipated LOS greater than 2 midnights    Guilford Shi MD [facility] Pager [phone]  If 7PM-7AM, please contact night-coverage [email or web address] Password [facility]  07/19/2018, 4:12 PM

## 2018-07-19 NOTE — ED Provider Notes (Addendum)
[facility] EMERGENCY DEPARTMENT Provider Note   CSN: [id number] Arrival date & time: 07/19/18  1257     History   Chief Complaint Chief Complaint  Patient presents with  . Altered Mental Status  . Fall    HPI Micheal Nicholson is a 84 y.o. male.  HPI 84 year old male with past medical history as below on chronic anticoagulation due to A. fib here with generalized weakness and increased frequency of falls.  Patient states that over the last week, he has had 4-5 falls at home.  He states that he just loses his balance, then is too weak to correct himself.  He is fallen and hit his head multiple times.  He also had to reportedly crawl on his knees and has had some right knee pain since then.  Denies any new hip pain, though does have some chronic right hip pain.  Of note, the patient states that earlier this week, he did have a cough with some sputum production and was bringing up phlegm.  States he has had some mild left-sided chest pain that is sharp and worse with inspiration over the same time.  Denies any current headache.  Denies any focal numbness or weakness.  States he has been taking his medications.  He does note that his appetite is been poor over the last week or 2.  Lives alone.  Past Medical History:  Diagnosis Date  . Anxiety   . Arthritis   . BPH (benign prostatic hyperplasia)   . CHF (congestive heart failure) ([place])   . Chronic combined systolic and diastolic CHF, NYHA class 2 ([place]) 04/17/2015  . GERD (gastroesophageal reflux disease)   . Hypertension   . Stroke ([place])   . Symptomatic sinus bradycardia    PPM Dr. Rayann Heman, 01/13/16, STJ device    Patient Active Problem List   Diagnosis Date Noted  . At high risk for falls 03/26/2017  . Acute on chronic systolic CHF (congestive heart failure) ([place]) 10/08/2016  . Acute respiratory failure with hypoxia ([place]) 10/08/2016  . CKD (chronic kidney disease), stage III ([place]) 10/08/2016  . Sacral  fracture, closed ([place]) 10/08/2016  . Hypoxia   . Hypokalemia 08/24/2016  . Community acquired pneumonia 08/23/2016  . Pruritus 08/09/2016  . Chronic anticoagulation 04/28/2016  . Cardiac pacemaker in situ 04/28/2016  . Chronic systolic congestive heart failure ([place]) 04/28/2016  . Left hemiparesis ([place]) 04/25/2016  . Encounter for therapeutic drug monitoring 04/19/2016  . Cardiomyopathy, ischemic   . Cerebral embolism with cerebral infarction 03/03/2016  . Normocytic anemia   . Thrombocytopenia ([place])   . Acute left hemiparesis ([place])   . Peripheral neuropathic pain   . Hemiplegia ([place]) 03/02/2016  . Adenopathy   . Long term current use of anticoagulant therapy 01/23/2016  . CAD (coronary artery disease) 01/23/2016  . Second degree Mobitz II AV block 01/13/2016  . Persistent atrial fibrillation ([place])   . Atrial fibrillation with slow ventricular response ([place])   . AKI (acute kidney injury) ([place])   . Bradycardia 12/29/2015  . Acute renal failure (ARF) ([place]) 12/29/2015  . Hyponatremia 12/29/2015  . Hypotension 12/29/2015  . Elevated troponin 12/29/2015  . Acute upper respiratory infection 12/23/2015  . RLS (restless legs syndrome) 09/09/2015  . Chronic combined systolic and diastolic CHF, NYHA class 2 ([place]) 04/17/2015  . Vitamin D deficiency 03/11/2015  . Cardiomyopathy -etiology uncertain 25/95/6387  . Paroxysmal atrial fibrillation ([place]) 08/27/2014  . 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 H/O: stroke 08/27/2014  .  GERD (gastroesophageal reflux disease) 02/07/2011  . BPH (benign prostatic hypertrophy) 07/12/2010  . Anxiety state 11/02/2009  . Essential hypertension 11/02/2009    Past Surgical History:  Procedure Laterality Date  . CARDIAC CATHETERIZATION  2006   Negative / 4 years ago  . EP IMPLANTABLE DEVICE N/A 01/13/2016   Procedure: Pacemaker Implant;  Surgeon: Thompson Grayer, MD;  Location: [facility];  Service: Cardiovascular;  Laterality: N/A;        Home  Medications    Prior to Admission medications   Medication Sig Start Date End Date Taking? Authorizing Provider  ALPRAZolam (XANAX) 0.5 MG tablet TAKE 1 TABLET BY MOUTH TWICE A DAY AS NEEDED FOR ANXIETY 05/29/18   Burchette, Alinda Sierras, MD  apixaban (ELIQUIS) 2.5 MG TABS tablet Take 1 tablet (2.5 mg total) by mouth 2 (two) times daily. 07/11/18   Croitoru, Mihai, MD  bisacodyl (DULCOLAX) 5 MG EC tablet Take 5 mg by mouth daily as needed for moderate constipation.    [provider]  carvedilol (COREG) 6.25 MG tablet TAKE 1 TABLET BY MOUTH 2 TIMES DAILY WITH A MEAL. 05/29/18   Burchette, Alinda Sierras, MD  Cholecalciferol (VITAMIN D PO) Take 1 tablet by mouth daily with breakfast.    [provider]  fluticasone (FLONASE) 50 MCG/ACT nasal spray Place 2 sprays into both nostrils 2 (two) times daily as needed for rhinitis.     [provider]  furosemide (LASIX) 40 MG tablet PLEASE SEE ATTACHED FOR DETAILED DIRECTIONS 07/17/18   Croitoru, Mihai, MD  hydrocortisone (CORTIZONE-10) 1 % ointment Apply 1 application topically 2 (two) times daily as needed for itching.    [provider]  lisinopril (PRINIVIL,ZESTRIL) 5 MG tablet TAKE 1 TABLET DAILY 01/21/18   Burchette, Alinda Sierras, MD  omeprazole (PRILOSEC) 20 MG capsule Take 1 capsule (20 mg total) by mouth daily as needed (for acid reflex). 11/24/16   Burchette, Alinda Sierras, MD  potassium chloride (KLOR-CON M10) 10 MEQ tablet Take 1 tablet (10 mEq total) by mouth daily. Please schedule appointment with Dr. [name] for refills. 06/17/18   Croitoru, Mihai, MD  pramipexole (MIRAPEX) 0.125 MG tablet TAKE 2 TABLETS BY MOUTH AT NIGHT FOR RESTLESS LEGS 05/01/18   Burchette, Alinda Sierras, MD  senna-docusate (SENOKOT-S) 8.6-50 MG tablet Take 1 tablet by mouth at bedtime as needed for mild constipation. 03/06/16   Dhungel, Flonnie Overman, MD  sertraline (ZOLOFT) 100 MG tablet TAKE 1/2 TABLET DAILY NEEDS OFFICE VISIT 05/29/18   Burchette, Alinda Sierras, MD  tamsulosin  (FLOMAX) 0.4 MG CAPS capsule TAKE 1 CAPSULE BY MOUTH EVERY DAY 07/08/18   Burchette, Alinda Sierras, MD  traMADol (ULTRAM) 50 MG tablet Take 1 tablet (50 mg total) by mouth every 6 (six) hours as needed. 05/29/18   Burchette, Alinda Sierras, MD  Triamcinolone Acetonide (TRIAMCINOLONE 0.1 % CREAM : EUCERIN) CREA Apply 1 application topically 2 (two) times daily as needed for rash, itching or irritation.    [provider]    Family History Family History  Problem Relation Age of Onset  . Pneumonia Father 62       died  . Lung cancer Brother   . Heart disease Brother   . Pancreatic cancer Son   . Healthy Son   . Heart failure Son     Social History Social History   Tobacco Use  . Smoking status: Former Smoker    Packs/day: 0.50    Years: 5.00    Pack years: 2.50  .  Smokeless tobacco: Never Used  Substance Use Topics  . Alcohol use: Yes    Alcohol/week: 0.6 oz    Types: 1 Shots of liquor per week    Comment: occ  . Drug use: No     Allergies   Chocolate   Review of Systems Review of Systems  Constitutional: Positive for fatigue. Negative for chills and fever.  HENT: Negative for congestion and rhinorrhea.   Eyes: Negative for visual disturbance.  Respiratory: Positive for cough and shortness of breath. Negative for wheezing.   Cardiovascular: Positive for chest pain. Negative for leg swelling.  Gastrointestinal: Negative for abdominal pain, diarrhea, nausea and vomiting.  Genitourinary: Negative for dysuria and flank pain.  Musculoskeletal: Positive for arthralgias. Negative for neck pain and neck stiffness.  Skin: Negative for rash and wound.  Allergic/Immunologic: Negative for immunocompromised state.  Neurological: Positive for weakness. Negative for syncope and headaches.  All other systems reviewed and are negative.    Physical Exam Updated Vital Signs BP 119/71   Pulse 62   Temp 97.6 F (36.4 C) (Oral)   Resp 11   Ht 5\' 7"  (1.702 m)   Wt 68 kg (150 lb)    SpO2 98%   BMI 23.49 kg/m   Physical Exam  Constitutional: He is oriented to person, place, and time. He appears well-developed and well-nourished. No distress.  Elderly, frail  HENT:  Head: Normocephalic and atraumatic.  Dry mucous membranes  Eyes: Conjunctivae are normal.  Neck: Neck supple.  Cardiovascular: Normal rate, regular rhythm and normal heart sounds. Exam reveals no friction rub.  No murmur heard. Pulmonary/Chest: Effort normal. No respiratory distress. He has no wheezes. He has rales.  Left basilar rales appreciated  Abdominal: Soft. He exhibits no distension.  Musculoskeletal: He exhibits no edema.  Moderate tenderness of right anterior knee without bruising or deformity.  Mild tenderness of her right greater trochanter.  No pain with passive range of motion of hips.  Neurological: He is alert and oriented to person, place, and time. He exhibits normal muscle tone.  Is all extremities with 5 out of 5 strength.  Normal sensation light touch.  Face symmetric.  Alert and oriented x4.  Skin: Skin is warm. Capillary refill takes less than 2 seconds.  Psychiatric: He has a normal mood and affect.  Nursing note and vitals reviewed.    ED Treatments / Results  Labs (all labs ordered are listed, but only abnormal results are displayed) Labs Reviewed  CBC WITH DIFFERENTIAL/PLATELET - Abnormal; Notable for the following components:      Result Value   RBC 4.09 (*)    Hemoglobin 11.7 (*)    HCT 37.5 (*)    Lymphs Abs 0.5 (*)    All other components within normal limits  COMPREHENSIVE METABOLIC PANEL - Abnormal; Notable for the following components:   BUN 24 (*)    Creatinine, Ser 1.50 (*)    Total Protein 6.4 (*)    Total Bilirubin 1.7 (*)    GFR calc non Af Amer 38 (*)    GFR calc Af Amer 43 (*)    All other components within normal limits  BRAIN NATRIURETIC PEPTIDE - Abnormal; Notable for the following components:   B Natriuretic Peptide 748.8 (*)    All other  components within normal limits  CULTURE, BLOOD (ROUTINE X 2)  CULTURE, BLOOD (ROUTINE X 2)  TROPONIN I  URINALYSIS, ROUTINE W REFLEX MICROSCOPIC  I-STAT CG4 LACTIC ACID, ED  I-STAT CG4 LACTIC  ACID, ED    EKG EKG Interpretation  Date/Time:  Friday July 19 2018 13:09:00 EDT Ventricular Rate:  63 PR Interval:    QRS Duration: 190 QT Interval:  525 QTC Calculation: 538 R Axis:   -85 Text Interpretation:  Atrial fibrillation Ventricular premature complex Right bundle branch block Left ventricular hypertrophy No significant change since last tracing Confirmed by Duffy Bruce [phone]) on 07/19/2018 1:17:01 PM   Radiology Dg Chest 1 View  Result Date: 07/19/2018 CLINICAL DATA:  Shortness of breath. Increased weakness and falls for 1 week. Atrial fibrillation. EXAM: CHEST  1 VIEW COMPARISON:  02/01/2017 FINDINGS: A single lead pacemaker remains in place. The cardiac silhouette remains moderately to prominently enlarged. There is aortic atherosclerosis. There is mild pulmonary vascular congestion and mild diffuse interstitial prominence, overall slightly less than on the prior study. Asymmetric patchy opacity is present in the right lung base. No sizable pleural effusion or pneumothorax is identified. Thoracolumbar scoliosis and old left rib fractures are noted. IMPRESSION: 1. Cardiomegaly and mild interstitial edema, slightly improved from the prior study. 2. Patchy right basilar opacity could reflect superimposed pneumonia. Electronically Signed   By: Logan Bores M.D.   On: 07/19/2018 14:17   Ct Head Wo Contrast  Result Date: 07/19/2018 CLINICAL DATA:  Pain after trauma. EXAM: CT HEAD WITHOUT CONTRAST CT CERVICAL SPINE WITHOUT CONTRAST TECHNIQUE: Multidetector CT imaging of the head and cervical spine was performed following the standard protocol without intravenous contrast. Multiplanar CT image reconstructions of the cervical spine were also generated. COMPARISON:  February 01, 2017  FINDINGS: CT HEAD FINDINGS Brain: No subdural, epidural, or subarachnoid hemorrhage. The ventricles and sulci are stable and unremarkable. Low-attenuation in the left cerebellar hemisphere on series 4, image 6 is noted to be within a sulcus on coronal imaging. The cerebellum is unremarkable. Cerebellum is otherwise unremarkable. The brainstem and basal cisterns are normal. No mass effect or midline shift. There is an infarct involving the white matter of the right frontal parietal region and a small portion of overlying cortex, unchanged. Scattered white matter changes are identified. A lacunar infarct in the left external capsule on image 19 is stable. Vascular: No hyperdense vessel or unexpected calcification. Skull: Normal. Negative for fracture or focal lesion. Sinuses/Orbits: No acute finding. Other: None. CT CERVICAL SPINE FINDINGS Alignment: Anterolisthesis C7 versus T1 measuring 3 mm today is unchanged since February 2018. No other malalignment. Skull base and vertebrae: No fractures are seen Soft tissues and spinal canal: No prevertebral fluid or swelling. No visible canal hematoma. Disc levels:  Multilevel degenerative changes. Upper chest: Negative. Other: No other abnormalities. IMPRESSION: 1. No acute intracranial abnormality. 2. No fracture or acute traumatic malalignment in the cervical spine. Electronically Signed   By: Dorise Bullion III M.D   On: 07/19/2018 15:19   Ct Cervical Spine Wo Contrast  Result Date: 07/19/2018 CLINICAL DATA:  Pain after trauma. EXAM: CT HEAD WITHOUT CONTRAST CT CERVICAL SPINE WITHOUT CONTRAST TECHNIQUE: Multidetector CT imaging of the head and cervical spine was performed following the standard protocol without intravenous contrast. Multiplanar CT image reconstructions of the cervical spine were also generated. COMPARISON:  February 01, 2017 FINDINGS: CT HEAD FINDINGS Brain: No subdural, epidural, or subarachnoid hemorrhage. The ventricles and sulci are stable and  unremarkable. Low-attenuation in the left cerebellar hemisphere on series 4, image 6 is noted to be within a sulcus on coronal imaging. The cerebellum is unremarkable. Cerebellum is otherwise unremarkable. The brainstem and basal cisterns are normal. No mass  effect or midline shift. There is an infarct involving the white matter of the right frontal parietal region and a small portion of overlying cortex, unchanged. Scattered white matter changes are identified. A lacunar infarct in the left external capsule on image 19 is stable. Vascular: No hyperdense vessel or unexpected calcification. Skull: Normal. Negative for fracture or focal lesion. Sinuses/Orbits: No acute finding. Other: None. CT CERVICAL SPINE FINDINGS Alignment: Anterolisthesis C7 versus T1 measuring 3 mm today is unchanged since February 2018. No other malalignment. Skull base and vertebrae: No fractures are seen Soft tissues and spinal canal: No prevertebral fluid or swelling. No visible canal hematoma. Disc levels:  Multilevel degenerative changes. Upper chest: Negative. Other: No other abnormalities. IMPRESSION: 1. No acute intracranial abnormality. 2. No fracture or acute traumatic malalignment in the cervical spine. Electronically Signed   By: Dorise Bullion III M.D   On: 07/19/2018 15:19   Dg Knee Complete 4 Views Right  Result Date: 07/19/2018 CLINICAL DATA:  Leg pain after fall last night. EXAM: RIGHT KNEE - COMPLETE 4+ VIEW COMPARISON:  04/22/2007 FINDINGS: There is no fracture or dislocation. There is a prominent joint effusion in the suprapatellar recess. Calcific tendinopathy of the distal quadriceps tendon. Tiny marginal osteophyte on the upper pole the patella. Slight medial joint space narrowing. IMPRESSION: New joint effusion.  No appreciable acute bone abnormality. Electronically Signed   By: Lorriane Shire M.D.   On: 07/19/2018 14:16   Dg Hip Unilat W Or Wo Pelvis 2-3 Views Right  Result Date: 07/19/2018 CLINICAL DATA:   Right leg pain secondary to a fall at home last night. EXAM: DG HIP (WITH OR WITHOUT PELVIS) 2-3V RIGHT COMPARISON:  Radiographs dated 02/01/2017 FINDINGS: There is no evidence of hip fracture or dislocation. There is no evidence of arthropathy or other focal bone abnormality. Diffuse osteopenia. Minimal medial joint space narrowing, chronic. IMPRESSION: No acute abnormalities. Electronically Signed   By: Lorriane Shire M.D.   On: 07/19/2018 14:18    Procedures .Critical Care Performed by: Duffy Bruce, MD Authorized by: Duffy Bruce, MD   Critical care provider statement:    Critical care time (minutes):  35   Critical care time was exclusive of:  Separately billable procedures and treating other patients and teaching time   Critical care was necessary to treat or prevent imminent or life-threatening deterioration of the following conditions:  Cardiac failure, circulatory failure and sepsis   Critical care was time spent personally by me on the following activities:  Development of treatment plan with patient or surrogate, discussions with consultants, evaluation of patient's response to treatment, examination of patient, obtaining history from patient or surrogate, ordering and performing treatments and interventions, ordering and review of laboratory studies, ordering and review of radiographic studies, pulse oximetry, re-evaluation of patient's condition and review of old charts   I assumed direction of critical care for this patient from another provider in my specialty: no     (including critical care time)  Medications Ordered in ED Medications  sodium chloride 0.9 % bolus 1,000 mL (0 mLs Intravenous Stopped 07/19/18 1443)  cefTRIAXone (ROCEPHIN) 1 g in sodium chloride 0.9 % 100 mL IVPB (0 g Intravenous Stopped 07/19/18 1516)  azithromycin (ZITHROMAX) 500 mg in sodium chloride 0.9 % 250 mL IVPB (500 mg Intravenous New Bag/Given 07/19/18 1447)     Initial Impression / Assessment and  Plan / ED Course  I have reviewed the triage vital signs and the nursing notes.  Pertinent labs & imaging  results that were available during my care of the patient were reviewed by me and considered in my medical decision making (see chart for details).  Clinical Course as of Jul 19 1548  Fri Jul 19, 6617  7934 84 year old male here with altered mental status, generalized weakness, and increased frequency of falls.  No focal neuro deficits on my exam.  He does appear dehydrated and generally weak.  Clinically, concern for possible pneumonia contributing to generalized weakness with increased falls.  Will obtain chest x-ray, as well as imaging of his head and neck given recurrent falls on blood thinners.  Screening labs sent.   [CI]    Clinical Course User Index [CI] Duffy Bruce, MD    Chest x-ray concerning for pneumonia.  Lab work is otherwise reassuring. WBC normal. CMP at baseline, though mildly elevated BUN:Cr ratio likely 2/2 dehydration. BNP elevated but pt appears clinically dehydrated.  Patient given fluids, antibiotics, will admit  Final Clinical Impressions(s) / ED Diagnoses   Final diagnoses:  Community acquired pneumonia of right lower lobe of lung ([place])  Fall, initial encounter    ED Discharge Orders    None       Duffy Bruce, MD 07/19/18 1549    Duffy Bruce, MD 07/30/18 [phone]

## 2018-07-19 NOTE — ED Triage Notes (Signed)
Pt brought in by [facility] from home for increased weakness and increase in # of falls x1 week. Pt lives at home alone, last fall last night- unwitnessed. Pt on eliquis for afib. Pt has pacemaker for bradycardia and heart block. Pt currently A+Ox4 and in NAD.

## 2018-07-19 NOTE — Progress Notes (Signed)
Subjective:     Patient ID: Micheal Nicholson, male   DOB: 11/02/1920, 84 y.o.   MRN: [id number]  HPI Patient seen with several week history of progressive lethargy, generalized weakness, frequent falls.  His chronic problems include history of atrial fibrillation, hypertension, combined systolic and diastolic heart failure, CAD, history of CVA, BPH, chronic kidney disease, chronic anxiety.  Has been living alone up to this time. Up until a couple weeks ago he was out frequently taking on his riding lawn mower and sometimes staying out several hours per day. Family suspected some dehydration. He was seen here week ago with suspected viral URI type symptoms. Nonfocal exam. No further evaluation done.  He ambulates with a Rollator and son noticed about a week ago that he was leaning more to the right side. Patient reported that he had fallen last night was on the ground for about 4 hours. When the son went to check on him this morning he found in on the floor beside his bed with his pants off. No reported urine or stool incontinence  They have noted decline in appetite recently. No slurred speech. No focal weakness. No known head injury but apparently had multiple falls as above  Patient is very lethargic currently and arousable but unable to provide good history  Past Medical History:  Diagnosis Date  . Anxiety   . Arthritis   . BPH (benign prostatic hyperplasia)   . CHF (congestive heart failure) ([place])   . Chronic combined systolic and diastolic CHF, NYHA class 2 ([place]) 04/17/2015  . GERD (gastroesophageal reflux disease)   . Hypertension   . Stroke ([place])   . Symptomatic sinus bradycardia    PPM Dr. Rayann Heman, 01/13/16, STJ device   Past Surgical History:  Procedure Laterality Date  . CARDIAC CATHETERIZATION  2006   Negative / 4 years ago  . EP IMPLANTABLE DEVICE N/A 01/13/2016   Procedure: Pacemaker Implant;  Surgeon: Thompson Grayer, MD;  Location: [facility];  Service:  Cardiovascular;  Laterality: N/A;    reports that he has quit smoking. He has a 2.50 pack-year smoking history. He has never used smokeless tobacco. He reports that he drinks about 0.6 oz of alcohol per week. He reports that he does not use drugs. family history includes Healthy in his son; Heart disease in his brother; Heart failure in his son; Lung cancer in his brother; Pancreatic cancer in his son; Pneumonia (age of onset: 37) in his father. Allergies  Allergen Reactions  . Chocolate Other (See Comments)    Reaction:  Acid reflux      Review of Systems As per above in history of present illness and obtained from family. Unreliable from patient -Progressive several week history of generalized weakness, decreased appetite, frequent falls, lethargy    Objective:   Physical Exam  Constitutional:  Patient is very lethargic but arousable.  HENT:  Mouth/Throat: Oropharynx is clear and moist.  Cardiovascular: Normal rate.  Pulmonary/Chest: Effort normal. No respiratory distress. He has no wheezes.  Abdominal: Soft. He exhibits no distension. There is no tenderness.  Musculoskeletal: He exhibits no edema.  Skin:  Patient has multiple small abrasions/eschars lower extremities       Assessment:     84 year old male with multiple chronic problems as above who presents with at least a few week history of progressive lethargy, generalized weakness, decreasing appetite, and frequent falls.  Rule out dehydration, infection, metabolic disturbance, head injury.  He is on eliquis for  atrial fibrillation history    Plan:     -Discussed with family further evaluation in ED. May need admission and even possibly some rehabilitation to see if he is suitable to go back home. Will almost certainly need more 85-GNPH supervision going forward -Increased risk of bleeds with eliquis. Discuss risk-benefit with cardiology  [name] MD [facility]

## 2018-07-20 DIAGNOSIS — J181 Lobar pneumonia, unspecified organism: Principal | ICD-10-CM

## 2018-07-20 DIAGNOSIS — I482 Chronic atrial fibrillation: Secondary | ICD-10-CM

## 2018-07-20 DIAGNOSIS — I5042 Chronic combined systolic (congestive) and diastolic (congestive) heart failure: Secondary | ICD-10-CM

## 2018-07-20 DIAGNOSIS — N183 Chronic kidney disease, stage 3 (moderate): Secondary | ICD-10-CM

## 2018-07-20 DIAGNOSIS — R627 Adult failure to thrive: Secondary | ICD-10-CM

## 2018-07-20 LAB — PROCALCITONIN

## 2018-07-20 LAB — URINALYSIS, ROUTINE W REFLEX MICROSCOPIC
BILIRUBIN URINE: NEGATIVE
Glucose, UA: NEGATIVE mg/dL
Hgb urine dipstick: NEGATIVE
KETONES UR: 5 mg/dL — AB
Leukocytes, UA: NEGATIVE
NITRITE: NEGATIVE
PROTEIN: NEGATIVE mg/dL
Specific Gravity, Urine: 1.023 (ref 1.005–1.030)
pH: 5 (ref 5.0–8.0)

## 2018-07-20 LAB — STREP PNEUMONIAE URINARY ANTIGEN: STREP PNEUMO URINARY ANTIGEN: NEGATIVE

## 2018-07-20 LAB — MRSA PCR SCREENING: MRSA by PCR: POSITIVE — AB

## 2018-07-20 MED ORDER — LORAZEPAM 2 MG/ML IJ SOLN
0.5000 mg | Freq: Four times a day (QID) | INTRAMUSCULAR | Status: DC | PRN
  Administered 2018-07-20 – 2018-07-23 (×4): 0.5 mg via INTRAVENOUS
  Filled 2018-07-20 (×4): qty 1

## 2018-07-20 NOTE — Evaluation (Signed)
Physical Therapy Evaluation Patient Details Name: Micheal Nicholson MRN: [id number] DOB: 06-26-1920 Today's Date: 07/20/2018   History of Present Illness  Pt is a 84 y/o male [employer] veteran admitted secondary to falls. Pt found to have RLL PNA. PMH including but not limited to atrial fibrillation, hypertension, combined systolic and diastolic heart failure, CAD, history of CVA, BPH, chronic kidney disease, chronic anxiety.    Clinical Impression  Pt presented sitting OOB in recliner chair, awake and willing to participate in therapy session. Prior to admission, pt reported that he ambulated with use of RW and was independent with ADLs. Pt lives alone but his youngest son comes to check on him often; however, cannot be with him 24/7. Pt currently requires min A for transfers and min guard to ambulate a short distance (~50') with RW. Pt limited secondary to fatigue. As pt lives alone and has had several falls per chart review, he would benefit from [employer] rehab at a SNF prior to returning home. Pt would continue to benefit from skilled physical therapy services at this time while admitted and after d/c to address the below listed limitations in order to improve overall safety and independence with functional mobility.      Follow Up Recommendations SNF    Equipment Recommendations  None recommended by PT    Recommendations for Other Services       Precautions / Restrictions Precautions Precautions: Fall Restrictions Weight Bearing Restrictions: No      Mobility  Bed Mobility               General bed mobility comments: pt OOB in recliner chair upon arrival  Transfers Overall transfer level: Needs assistance Equipment used: Rolling walker (2 wheeled) Transfers: Sit to/from Stand Sit to Stand: Min assist         General transfer comment: increased time and effort, min A to power into standing and for stability with transition  Ambulation/Gait Ambulation/Gait assistance:  Min guard Gait Distance (Feet): 50 Feet Assistive device: Rolling walker (2 wheeled) Gait Pattern/deviations: Trunk flexed;Decreased stride length;Step-through pattern;Decreased step length - right;Decreased step length - left Gait velocity: decreased Gait velocity interpretation: <1.31 ft/sec, indicative of household ambulator General Gait Details: pt with modest instability requiring constant min guard and cueing for safety with RW; pt very limited secondary to fatigue and required frequent standing rest breaks  Stairs            Wheelchair Mobility    Modified Rankin (Stroke Patients Only)       Balance Overall balance assessment: Needs assistance;History of Falls Sitting-balance support: Feet supported Sitting balance-Leahy Scale: Good     Standing balance support: During functional activity;Bilateral upper extremity supported Standing balance-Leahy Scale: Poor                               Pertinent Vitals/Pain Pain Assessment: No/denies pain    Home Living Family/patient expects to be discharged to:: Private residence Living Arrangements: Alone Available Help at Discharge: Family;Available PRN/intermittently Type of Home: House Home Access: Level entry     Home Layout: One level Home Equipment: Walker - 2 wheels;Shower seat      Prior Function Level of Independence: Independent with assistive device(s)         Comments: pt ambulates with RW     Hand Dominance        Extremity/Trunk Assessment   Upper Extremity Assessment Upper Extremity Assessment: Generalized weakness  Lower Extremity Assessment Lower Extremity Assessment: Generalized weakness    Cervical / Trunk Assessment Cervical / Trunk Assessment: Kyphotic  Communication   Communication: HOH  Cognition Arousal/Alertness: Awake/alert Behavior During Therapy: Impulsive Overall Cognitive Status: Impaired/Different from baseline Area of Impairment:  Memory;Safety/judgement;Problem solving                     Memory: Decreased short-term memory   Safety/Judgement: Decreased awareness of deficits;Decreased awareness of safety   Problem Solving: Difficulty sequencing;Requires verbal cues        General Comments      Exercises     Assessment/Plan    PT Assessment Patient needs continued PT services  PT Problem List Decreased strength;Decreased activity tolerance;Decreased balance;Decreased mobility;Decreased coordination;Decreased knowledge of use of DME;Decreased safety awareness;Decreased knowledge of precautions;Decreased cognition       PT Treatment Interventions DME instruction;Stair training;Gait training;Functional mobility training;Therapeutic activities;Therapeutic exercise;Balance training;Neuromuscular re-education;Patient/family education    PT Goals (Current goals can be found in the Care Plan section)  Acute Rehab PT Goals Patient Stated Goal: to go home ASAP PT Goal Formulation: With patient Time For Goal Achievement: 08/03/18 Potential to Achieve Goals: Fair    Frequency Min 3X/week   Barriers to discharge        Co-evaluation               AM-PAC PT "6 Clicks" Daily Activity  Outcome Measure Difficulty turning over in bed (including adjusting bedclothes, sheets and blankets)?: A Little Difficulty moving from lying on back to sitting on the side of the bed? : A Little Difficulty sitting down on and standing up from a chair with arms (e.g., wheelchair, bedside commode, etc,.)?: Unable Help needed moving to and from a bed to chair (including a wheelchair)?: A Little Help needed walking in hospital room?: A Little Help needed climbing 3-5 steps with a railing? : A Little 6 Click Score: 16    End of Session Equipment Utilized During Treatment: Gait belt Activity Tolerance: Patient limited by fatigue Patient left: in chair;with call bell/phone within reach;with chair alarm set Nurse  Communication: Mobility status PT Visit Diagnosis: Other abnormalities of gait and mobility (R26.89)    Time: 2637-8588 PT Time Calculation (min) (ACUTE ONLY): 22 min   Charges:   PT Evaluation $PT Eval Moderate Complexity: [place], [place], [place] 07/20/2018, 1:40 PM

## 2018-07-20 NOTE — Evaluation (Signed)
Clinical/Bedside Swallow Evaluation Patient Details  Name: Micheal Nicholson MRN: [id number] Date of Birth: 1920-03-09  Today's Date: 07/20/2018 Time: SLP Start Time (ACUTE ONLY): 1049 SLP Stop Time (ACUTE ONLY): 1103 SLP Time Calculation (min) (ACUTE ONLY): 14 min  Past Medical History:  Past Medical History:  Diagnosis Date  . Anxiety   . Arthritis   . BPH (benign prostatic hyperplasia)   . CHF (congestive heart failure) ([place])   . Chronic combined systolic and diastolic CHF, NYHA class 2 ([place]) 04/17/2015  . GERD (gastroesophageal reflux disease)   . Hypertension   . Stroke ([place])   . Symptomatic sinus bradycardia    PPM Dr. Rayann Heman, 01/13/16, STJ device   Past Surgical History:  Past Surgical History:  Procedure Laterality Date  . CARDIAC CATHETERIZATION  2006   Negative / 4 years ago  . EP IMPLANTABLE DEVICE N/A 01/13/2016   Procedure: Pacemaker Implant;  Surgeon: Thompson Grayer, MD;  Location: [facility];  Service: Cardiovascular;  Laterality: N/A;   HPI:  Pt is a 84 y.o. [employer] veteran with history h/o atrial fibrillation, hypertension, combined systolic and diastolic heart failure, CAD, CVA, BPH, chronic kidney disease, chronic anxiety presents with recurrent falls. CXR concerning for RLL PNA, therefore swallow evaluation was ordered. Prior BSEs in March and September 2017 were both completed after episodes of coughing with intake. Pt was felt to be at risk for episodic aspiration that did not warrant diet modifications or SLP f/u.   Assessment / Plan / Recommendation Clinical Impression  Upon SLP arrival pt was swallowing meds whole with thin liquid, with immediate coughing noted. Pt was semi-reclined in his chair, with coughing reduced but not eliminated after repositioning. Wet vocal quality was also intermittently noted. SLP removed straw with reduced overt signs of aspiration observed. No overt signs of aspiration were noted with other solid textures but with  soft solids he has moderately prolonged mastication. Pt self-regulated with Mod I, and would not take another bite or sip until his mouth was cleared. Recommend continuing on regular diet and thin liquids but with no straws, meds whole in puree, and use of full supervision to increase safety. SLP will f/u to assess for tolerance. May want to consider MBS in setting of RLL PNA if s/s of aspiration persist if pt/family are interested. SLP Visit Diagnosis: Dysphagia, unspecified (R13.10)    Aspiration Risk  Mild aspiration risk;Moderate aspiration risk    Diet Recommendation Regular;Thin liquid   Liquid Administration via: Cup;No straw Medication Administration: Whole meds with puree Supervision: Patient able to self feed;Full supervision/cueing for compensatory strategies Compensations: Minimize environmental distractions;Slow rate;Small sips/bites Postural Changes: Seated upright at 90 degrees    Other  Recommendations Oral Care Recommendations: Oral care BID   Follow up Recommendations (tba)      Frequency and Duration min 2x/week  2 weeks       Prognosis        Swallow Study   General HPI: Pt is a 84 y.o. World War II veteran with history h/o atrial fibrillation, hypertension, combined systolic and diastolic heart failure, CAD, CVA, BPH, chronic kidney disease, chronic anxiety presents with recurrent falls. CXR concerning for RLL PNA, therefore swallow evaluation was ordered. Prior BSEs in March and September 2017 were both completed after episodes of coughing with intake. Pt was felt to be at risk for episodic aspiration that did not warrant diet modifications or SLP f/u. Type of Study: Bedside Swallow Evaluation Previous Swallow Assessment: see HPI  Diet Prior to this Study: Regular;Thin liquids Temperature Spikes Noted: No Respiratory Status: Room air History of Recent Intubation: No Behavior/Cognition: Alert;Cooperative;Requires cueing Oral Care Completed by SLP: No Oral  [place] - Dentition: Missing dentition Vision: Functional for self-feeding Self-Feeding Abilities: Able to feed self Patient Positioning: Upright in chair Baseline Vocal Quality: Normal Volitional Cough: Strong    Oral/Motor/Sensory Function     Ice Chips Ice chips: Not tested   Thin Liquid Thin Liquid: Impaired Presentation: Cup;Self Fed;Straw Pharyngeal  Phase Impairments: Cough - Immediate;Wet Vocal Quality;Throat Clearing - Immediate    Nectar Thick Nectar Thick Liquid: Not tested   Honey Thick Honey Thick Liquid: Not tested   Puree Puree: Within functional limits Presentation: Self Fed;Spoon   Solid     Solid: Impaired Presentation: Self Fed Oral Phase Functional Implications: Impaired mastication      Germain Osgood 07/20/2018,11:17 AM   Germain Osgood, M.A. CCC-SLP [phone]

## 2018-07-20 NOTE — Progress Notes (Addendum)
PROGRESS NOTE  Micheal Nicholson ZJQ:[id number] DOB: [birth year] DOA: 07/19/2018 PCP: [name], MD   Shortness of breath. URI symptom with productive cough,  Increased weakness and falls for 1 week. she was seen by pmd on 7/18 then 7/26 for this, she is sent to ED for eval on 7/26 and got admitted for FTT and possible pna   HPI/Recap of past 24 hours:  Sitter in room due to intermittent confusion Currently he is sitting up in chair , eating I did not notice cough,  He denies pain,  He is hard of hearing, he is oriented to person, place and the month He states it is  Year 2020  He asked "me where is Dr Loletha Grayer" ( his cardiologist is Dr [name])  Assessment/Plan: Active Problems:   Metabolic encephalopathy  CAP -cxr with "Patchy right basilar opacity could reflect superimposed" -No hypoxia, no fever, no leukocytosis -Will check procalcitonin, urine strep, sputum culture, respiratory viral panel, mrsa screening, swallow eval   Combined systolic and diastolic CHF -Last ef from 2017 35-40% -cxr "Cardiomegaly and mild interstitial edema" -no wheezing or rales on exam, no edema -close monitor volume status  Afib with bradycardia s/p pacemaker Rate controlled on coreg, apixaban  HTN Stable on current regimen  CKDIII Stable at baseline Renal dosing meds  Frequent falls Will discuss with patient about apixaban If patient agreed to snf, then could continue apixaban, if he insist on going home, then would d/c apixaban  FTT  get PT eval, patient has in the past declined snf  Addendum: 4pm on 7/27 Patient became increasingly confused in the Evening, continue sitter, prn ativan  Code Status: DNR  Family Communication: patient   Disposition Plan: pending   Consultants:  cardiology  Procedures:  none  Antibiotics:  Doxycycline and rocephin since admission   Objective: BP 133/73 (BP Location: Left Arm)   Pulse (!) 58   Temp 97.7 F (36.5 C)  (Oral)   Resp 18   Ht 5\' 10"  (1.778 m)   Wt 71.2 kg (156 lb 15.5 oz)   SpO2 99%   BMI 22.52 kg/m   Intake/Output Summary (Last 24 hours) at 07/20/2018 2409 Last data filed at 07/20/2018 7353 Gross per 24 hour  Intake 1368.04 ml  Output 1 ml  Net 1367.04 ml   Filed Weights   07/19/18 1304 07/19/18 1931  Weight: 68 kg (150 lb) 71.2 kg (156 lb 15.5 oz)    Exam: Patient is examined daily including today on 07/20/2018, exams remain the same as of yesterday except that has changed    General:   Very Frail, hard of hearing, thin but NAD  Cardiovascular: paced rhythm  Respiratory: CTABL  Abdomen: Soft/ND/NT, positive BS  Musculoskeletal: No Edema Neuro: alert, oriented person, place and the month  Data Reviewed: Basic Metabolic Panel: Recent Labs  Lab 07/19/18 1335 07/19/18 1831  NA 140  --   K 4.3  --   CL 107  --   CO2 23  --   GLUCOSE 90  --   BUN 24*  --   CREATININE 1.50* 1.47*  CALCIUM 8.9  --    Liver Function Tests: Recent Labs  Lab 07/19/18 1335  AST 20  ALT 15  ALKPHOS 51  BILITOT 1.7*  PROT 6.4*  ALBUMIN 3.5   No results for input(s): LIPASE, AMYLASE in the last 168 hours. No results for input(s): AMMONIA in the last 168 hours. CBC: Recent Labs  Lab 07/19/18 1335 07/19/18  1831  WBC 4.0 3.3*  NEUTROABS 3.0  --   HGB 11.7* 11.2*  HCT 37.5* 35.2*  MCV 91.7 91.4  PLT 183 170   Cardiac Enzymes:   Recent Labs  Lab 07/19/18 1335  TROPONINI <0.03   BNP (last 3 results) Recent Labs    07/19/18 1335  BNP 748.8*    ProBNP (last 3 results) No results for input(s): PROBNP in the last 8760 hours.  CBG: No results for input(s): GLUCAP in the last 168 hours.  No results found for this or any previous visit (from the past 240 hour(s)).   Studies: Dg Chest 1 View  Result Date: 07/19/2018 CLINICAL DATA:  Shortness of breath. Increased weakness and falls for 1 week. Atrial fibrillation. EXAM: CHEST  1 VIEW COMPARISON:  02/01/2017  FINDINGS: A single lead pacemaker remains in place. The cardiac silhouette remains moderately to prominently enlarged. There is aortic atherosclerosis. There is mild pulmonary vascular congestion and mild diffuse interstitial prominence, overall slightly less than on the prior study. Asymmetric patchy opacity is present in the right lung base. No sizable pleural effusion or pneumothorax is identified. Thoracolumbar scoliosis and old left rib fractures are noted. IMPRESSION: 1. Cardiomegaly and mild interstitial edema, slightly improved from the prior study. 2. Patchy right basilar opacity could reflect superimposed pneumonia. Electronically Signed   By: Logan Bores M.D.   On: 07/19/2018 14:17   Ct Head Wo Contrast  Result Date: 07/19/2018 CLINICAL DATA:  Pain after trauma. EXAM: CT HEAD WITHOUT CONTRAST CT CERVICAL SPINE WITHOUT CONTRAST TECHNIQUE: Multidetector CT imaging of the head and cervical spine was performed following the standard protocol without intravenous contrast. Multiplanar CT image reconstructions of the cervical spine were also generated. COMPARISON:  February 01, 2017 FINDINGS: CT HEAD FINDINGS Brain: No subdural, epidural, or subarachnoid hemorrhage. The ventricles and sulci are stable and unremarkable. Low-attenuation in the left cerebellar hemisphere on series 4, image 6 is noted to be within a sulcus on coronal imaging. The cerebellum is unremarkable. Cerebellum is otherwise unremarkable. The brainstem and basal cisterns are normal. No mass effect or midline shift. There is an infarct involving the white matter of the right frontal parietal region and a small portion of overlying cortex, unchanged. Scattered white matter changes are identified. A lacunar infarct in the left external capsule on image 19 is stable. Vascular: No hyperdense vessel or unexpected calcification. Skull: Normal. Negative for fracture or focal lesion. Sinuses/Orbits: No acute finding. Other: None. CT CERVICAL SPINE  FINDINGS Alignment: Anterolisthesis C7 versus T1 measuring 3 mm today is unchanged since February 2018. No other malalignment. Skull base and vertebrae: No fractures are seen Soft tissues and spinal canal: No prevertebral fluid or swelling. No visible canal hematoma. Disc levels:  Multilevel degenerative changes. Upper chest: Negative. Other: No other abnormalities. IMPRESSION: 1. No acute intracranial abnormality. 2. No fracture or acute traumatic malalignment in the cervical spine. Electronically Signed   By: Dorise Bullion III M.D   On: 07/19/2018 15:19   Ct Cervical Spine Wo Contrast  Result Date: 07/19/2018 CLINICAL DATA:  Pain after trauma. EXAM: CT HEAD WITHOUT CONTRAST CT CERVICAL SPINE WITHOUT CONTRAST TECHNIQUE: Multidetector CT imaging of the head and cervical spine was performed following the standard protocol without intravenous contrast. Multiplanar CT image reconstructions of the cervical spine were also generated. COMPARISON:  February 01, 2017 FINDINGS: CT HEAD FINDINGS Brain: No subdural, epidural, or subarachnoid hemorrhage. The ventricles and sulci are stable and unremarkable. Low-attenuation in the left cerebellar hemisphere on  series 4, image 6 is noted to be within a sulcus on coronal imaging. The cerebellum is unremarkable. Cerebellum is otherwise unremarkable. The brainstem and basal cisterns are normal. No mass effect or midline shift. There is an infarct involving the white matter of the right frontal parietal region and a small portion of overlying cortex, unchanged. Scattered white matter changes are identified. A lacunar infarct in the left external capsule on image 19 is stable. Vascular: No hyperdense vessel or unexpected calcification. Skull: Normal. Negative for fracture or focal lesion. Sinuses/Orbits: No acute finding. Other: None. CT CERVICAL SPINE FINDINGS Alignment: Anterolisthesis C7 versus T1 measuring 3 mm today is unchanged since February 2018. No other malalignment.  Skull base and vertebrae: No fractures are seen Soft tissues and spinal canal: No prevertebral fluid or swelling. No visible canal hematoma. Disc levels:  Multilevel degenerative changes. Upper chest: Negative. Other: No other abnormalities. IMPRESSION: 1. No acute intracranial abnormality. 2. No fracture or acute traumatic malalignment in the cervical spine. Electronically Signed   By: Dorise Bullion III M.D   On: 07/19/2018 15:19   Dg Knee Complete 4 Views Right  Result Date: 07/19/2018 CLINICAL DATA:  Leg pain after fall last night. EXAM: RIGHT KNEE - COMPLETE 4+ VIEW COMPARISON:  04/22/2007 FINDINGS: There is no fracture or dislocation. There is a prominent joint effusion in the suprapatellar recess. Calcific tendinopathy of the distal quadriceps tendon. Tiny marginal osteophyte on the upper pole the patella. Slight medial joint space narrowing. IMPRESSION: New joint effusion.  No appreciable acute bone abnormality. Electronically Signed   By: Lorriane Shire M.D.   On: 07/19/2018 14:16   Dg Hip Unilat W Or Wo Pelvis 2-3 Views Right  Result Date: 07/19/2018 CLINICAL DATA:  Right leg pain secondary to a fall at home last night. EXAM: DG HIP (WITH OR WITHOUT PELVIS) 2-3V RIGHT COMPARISON:  Radiographs dated 02/01/2017 FINDINGS: There is no evidence of hip fracture or dislocation. There is no evidence of arthropathy or other focal bone abnormality. Diffuse osteopenia. Minimal medial joint space narrowing, chronic. IMPRESSION: No acute abnormalities. Electronically Signed   By: Lorriane Shire M.D.   On: 07/19/2018 14:18    Scheduled Meds: . apixaban  2.5 mg Oral BID  . carvedilol  3.125 mg Oral BID WC  . docusate sodium  100 mg Oral BID  . doxycycline  100 mg Oral Q12H  . lisinopril  5 mg Oral Daily  . pantoprazole  40 mg Oral Daily  . pramipexole  0.25 mg Oral QHS  . sertraline  25 mg Oral Daily  . tamsulosin  0.4 mg Oral Daily    Continuous Infusions: . cefTRIAXone (ROCEPHIN)  IV        Time spent: 69mins, case discussed with cardiology Dr Haroldine Laws over the phone I have personally reviewed and interpreted on  07/20/2018 daily labs, tele strips, imagings as discussed above under date review session and assessment and plans.  I reviewed all nursing notes, pharmacy notes, consultant notes,  vitals, pertinent old records  I have discussed plan of care as described above with RN , patient on 07/20/2018   Florencia Reasons MD, PhD  [facility] Pager [phone]. If 7PM-7AM, please contact night-coverage at [email or web address], password [facility] 07/20/2018, 8:21 AM  LOS: 1 day

## 2018-07-20 NOTE — Progress Notes (Signed)
   Cardiology  Asked to see patient with regards to Eliquis continuation.   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 Has been on Eliquis 2.5 bid. Recently seen by Dr. Recardo Evangelist and Eliquis continued as he was doing well and not falling.   Now admitted with PNA, FTT with encephalopathy and falls.   Consult called regarding our opinion on continuing Eliquis.   Yearly stroke risk with CHADSVAsc 7 = 9.6% (high risk)  I agree with Dr. Recardo Evangelist. If patient felt to be low fall risk after treatment of current illness, I would continue. If continues to be weak and at high risk for falls I think it would be prudent to stop.   I d/w Dr. Annamaria Boots by phone. Please call if further questions.   Glori Bickers, MD  8:14 AM

## 2018-07-21 LAB — CBC WITH DIFFERENTIAL/PLATELET
ABS IMMATURE GRANULOCYTES: 0 10*3/uL (ref 0.0–0.1)
BASOS PCT: 0 %
Basophils Absolute: 0 10*3/uL (ref 0.0–0.1)
EOS ABS: 0 10*3/uL (ref 0.0–0.7)
Eosinophils Relative: 0 %
HCT: 39.1 % (ref 39.0–52.0)
Hemoglobin: 12.8 g/dL — ABNORMAL LOW (ref 13.0–17.0)
IMMATURE GRANULOCYTES: 1 %
LYMPHS ABS: 0.7 10*3/uL (ref 0.7–4.0)
Lymphocytes Relative: 12 %
MCH: 29 pg (ref 26.0–34.0)
MCHC: 32.7 g/dL (ref 30.0–36.0)
MCV: 88.7 fL (ref 78.0–100.0)
Monocytes Absolute: 0.5 10*3/uL (ref 0.1–1.0)
Monocytes Relative: 9 %
NEUTROS PCT: 78 %
Neutro Abs: 4.5 10*3/uL (ref 1.7–7.7)
PLATELETS: 214 10*3/uL (ref 150–400)
RBC: 4.41 MIL/uL (ref 4.22–5.81)
RDW: 14.8 % (ref 11.5–15.5)
WBC: 5.8 10*3/uL (ref 4.0–10.5)

## 2018-07-21 LAB — RESPIRATORY PANEL BY PCR
ADENOVIRUS-RVPPCR: NOT DETECTED
Bordetella pertussis: NOT DETECTED
CHLAMYDOPHILA PNEUMONIAE-RVPPCR: NOT DETECTED
CORONAVIRUS NL63-RVPPCR: NOT DETECTED
Coronavirus 229E: NOT DETECTED
Coronavirus HKU1: NOT DETECTED
Coronavirus OC43: NOT DETECTED
INFLUENZA A-RVPPCR: NOT DETECTED
INFLUENZA B-RVPPCR: NOT DETECTED
MYCOPLASMA PNEUMONIAE-RVPPCR: NOT DETECTED
Metapneumovirus: NOT DETECTED
PARAINFLUENZA VIRUS 4-RVPPCR: NOT DETECTED
Parainfluenza Virus 1: NOT DETECTED
Parainfluenza Virus 2: NOT DETECTED
Parainfluenza Virus 3: NOT DETECTED
Respiratory Syncytial Virus: NOT DETECTED
Rhinovirus / Enterovirus: NOT DETECTED

## 2018-07-21 LAB — COMPREHENSIVE METABOLIC PANEL
ALBUMIN: 3.2 g/dL — AB (ref 3.5–5.0)
ALT: 16 U/L (ref 0–44)
AST: 26 U/L (ref 15–41)
Alkaline Phosphatase: 54 U/L (ref 38–126)
Anion gap: 9 (ref 5–15)
BUN: 25 mg/dL — AB (ref 8–23)
CHLORIDE: 108 mmol/L (ref 98–111)
CO2: 22 mmol/L (ref 22–32)
Calcium: 8.9 mg/dL (ref 8.9–10.3)
Creatinine, Ser: 1.55 mg/dL — ABNORMAL HIGH (ref 0.61–1.24)
GFR calc Af Amer: 42 mL/min — ABNORMAL LOW (ref >60)
GFR, EST NON AFRICAN AMERICAN: 36 mL/min — AB (ref >60)
GLUCOSE: 108 mg/dL — AB (ref 70–99)
Potassium: 4.4 mmol/L (ref 3.5–5.1)
SODIUM: 139 mmol/L (ref 135–145)
Total Bilirubin: 2.5 mg/dL — ABNORMAL HIGH (ref 0.3–1.2)
Total Protein: 6.2 g/dL — ABNORMAL LOW (ref 6.5–8.1)

## 2018-07-21 LAB — PROCALCITONIN: Procalcitonin: <0.1 ng/mL

## 2018-07-21 MED ORDER — LORAZEPAM 2 MG/ML IJ SOLN
0.5000 mg | Freq: Every day | INTRAMUSCULAR | Status: AC
  Administered 2018-07-21 – 2018-07-22 (×2): 0.5 mg via INTRAVENOUS
  Filled 2018-07-21 (×2): qty 1

## 2018-07-21 MED ORDER — CHLORHEXIDINE GLUCONATE CLOTH 2 % EX PADS
6.0000 | MEDICATED_PAD | Freq: Every day | CUTANEOUS | Status: DC
  Administered 2018-07-22 – 2018-07-24 (×2): 6 via TOPICAL

## 2018-07-21 MED ORDER — AMOXICILLIN-POT CLAVULANATE 500-125 MG PO TABS
500.0000 mg | ORAL_TABLET | Freq: Two times a day (BID) | ORAL | Status: DC
  Administered 2018-07-21 – 2018-07-24 (×7): 500 mg via ORAL
  Filled 2018-07-21 (×7): qty 1

## 2018-07-21 MED ORDER — MUPIROCIN 2 % EX OINT
1.0000 "application " | TOPICAL_OINTMENT | Freq: Two times a day (BID) | CUTANEOUS | Status: DC
  Administered 2018-07-21 – 2018-07-24 (×7): 1 via NASAL
  Filled 2018-07-21 (×4): qty 22

## 2018-07-21 MED ORDER — TAMSULOSIN HCL 0.4 MG PO CAPS
0.4000 mg | ORAL_CAPSULE | Freq: Two times a day (BID) | ORAL | Status: DC
  Administered 2018-07-21 – 2018-07-24 (×6): 0.4 mg via ORAL
  Filled 2018-07-21 (×6): qty 1

## 2018-07-21 NOTE — Progress Notes (Addendum)
PROGRESS NOTE  Quentin Shorey UEA:[id number] DOB: Oct 20, 1920 DOA: 07/19/2018 PCP: [name], MD   Shortness of breath. URI symptom with productive cough,  Increased weakness and falls for 1 week. she was seen by pmd on 7/18 then 7/26 for this, she is sent to ED for eval on 7/26 and got admitted for FTT and possible pna   HPI/Recap of past 24 hours:  Sitter in room due to intermittent confusion He had urinary retention required in and out cath last night I did not notice cough,   He is drowsy this am  Assessment/Plan: Active Problems:   Metabolic encephalopathy  CAP -cxr with "Patchy right basilar opacity could reflect superimposed" -Family reported patient has been having congested cough and right lower chest pain at home -No hypoxia, no fever, no leukocytosis -procalcitonin less than 0.1, urine strep antigen negative, sputum culture not collected, respiratory viral panel negtive mrsa screening +,  He did well with swallow eval -d/c rocephin, change to augmentin, continue doxycycline,    Combined systolic and diastolic CHF -Last ef from 2017 35-40% -cxr "Cardiomegaly and mild interstitial edema" -no wheezing or rales on exam, no edema -close monitor volume status  Afib with bradycardia s/p pacemaker ([employer] Jude single-chamber permanent pacemaker in January 2017 (Dr. Rayann Heman). 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  Urinary retention: Not a candidate for indwelling foley due to confusion Increase flomax from daily to bid,  Bladder scan and in and out cath prn  HTN Stable on current regimen  CKDIII Stable at baseline Renal dosing meds  FTT  get PT eval, patient has in the past declined snf  Delirium: Will schedule low dose ativan at night Will need snf placement once delirium improves and he can be off sitter   Code Status: DNR  Family Communication: patient and family at bedside   Disposition Plan: SNF   Consultants:  cardiology  Procedures:  none  Antibiotics:  Doxycycline  Since admission  rocephin from admission ot 7/28   Objective: BP 129/80 (BP Location: Left Arm)   Pulse 64   Temp 97.6 F (36.4 C) (Oral)   Resp 18   Ht 5\' 10"  (1.778 m)   Wt 71.2 kg (156 lb 15.5 oz)   SpO2 96%   BMI 22.52 kg/m   Intake/Output Summary (Last 24 hours) at 07/21/2018 0820 Last data filed at 07/21/2018 0429 Gross per 24 hour  Intake 700 ml  Output 515 ml  Net 185 ml   Filed Weights   07/19/18 1304 07/19/18 1931  Weight: 68 kg (150 lb) 71.2 kg (156 lb 15.5 oz)    Exam: Patient is examined daily including today on 07/21/2018, exams remain the same as of yesterday except that has changed    General:   Very Frail, hard of hearing, thin, confused and lethargic this am  Cardiovascular: paced rhythm  Respiratory: CTABL  Abdomen: Soft/ND/NT, positive BS  Musculoskeletal: No Edema Neuro: he is very lethargic today  Data Reviewed: Basic Metabolic Panel: Recent Labs  Lab 07/19/18 1335 07/19/18 1831  NA 140  --   K 4.3  --   CL 107  --   CO2 23  --   GLUCOSE 90  --   BUN 24*  --   CREATININE 1.50* 1.47*  CALCIUM 8.9  --    Liver Function Tests: Recent Labs  Lab 07/19/18 1335  AST 20  ALT 15  ALKPHOS 51  BILITOT 1.7*  PROT 6.4*  ALBUMIN 3.5   No results for input(s): LIPASE, AMYLASE in the last 168 hours. No results for input(s): AMMONIA in  the last 168 hours. CBC: Recent Labs  Lab 07/19/18 1335 07/19/18 1831 07/21/18 0715  WBC 4.0 3.3* 5.8  NEUTROABS 3.0  --  4.5  HGB 11.7* 11.2* 12.8*  HCT 37.5* 35.2* 39.1  MCV 91.7 91.4 88.7  PLT 183 170 214   Cardiac Enzymes:   Recent Labs  Lab 07/19/18 1335  TROPONINI <0.03   BNP (last 3 results) Recent Labs    07/19/18 1335  BNP 748.8*    ProBNP (last 3 results) No results for input(s): PROBNP in the last 8760 hours.  CBG: No results for input(s): GLUCAP in the last 168 hours.  Recent Results (from the past 240 hour(s))  Blood culture (routine x 2)     Status: None (Preliminary result)   Collection Time: 07/19/18  2:30 PM  Result Value Ref Range Status   Specimen Description BLOOD SITE NOT SPECIFIED  Final   Special Requests   Final    BOTTLES DRAWN AEROBIC AND ANAEROBIC Blood Culture adequate volume   Culture   Final    NO GROWTH 1 DAY Performed at [facility], [address] [place], [place] [address]    Report Status PENDING  Incomplete  Blood culture (routine x 2)     Status: None (Preliminary result)   Collection Time: 07/19/18  6:31 PM  Result Value Ref Range Status   Specimen Description BLOOD LEFT ANTECUBITAL  Final   Special Requests   Final    BOTTLES DRAWN AEROBIC AND ANAEROBIC Blood Culture adequate volume   Culture   Final    NO GROWTH < 24 HOURS Performed at [facility], [place] [address], [place], [place] [address]    Report Status PENDING  Incomplete  MRSA PCR Screening     Status: Abnormal   Collection Time: 07/20/18 12:28 PM  Result Value Ref Range Status   MRSA by PCR POSITIVE (A) NEGATIVE Final    Comment:        The GeneXpert MRSA Assay (FDA approved for NASAL specimens only), is one component of a comprehensive MRSA colonization surveillance program. It is not intended to diagnose MRSA infection nor to guide or monitor treatment for MRSA infections. RESULT CALLED TO, READ BACK BY AND VERIFIED WITH: [facility] RN  AT 1425 07/20/18 BY A.DAVIS Performed at [facility], [place] [address] [place],  [address]      Studies: No results found.  Scheduled Meds: . apixaban  2.5 mg Oral BID  . carvedilol  3.125 mg Oral BID WC  . docusate sodium  100 mg Oral BID  . doxycycline  100 mg Oral Q12H  . lisinopril  5 mg Oral Daily  . pantoprazole  40 mg Oral Daily  . pramipexole  0.25 mg Oral QHS  . sertraline  25 mg  Oral Daily  . tamsulosin  0.4 mg Oral Daily    Continuous Infusions:    Time spent: 73mins,  I have personally reviewed and interpreted on  07/21/2018 daily labs,  imagings as discussed above under date review session and assessment and plans.  I reviewed all nursing notes, pharmacy notes, vitals, pertinent old records  I have discussed plan of care as described above with RN , patient and family on 07/21/2018   Florencia Reasons MD, PhD  [facility] Pager [phone]. If 7PM-7AM, please contact night-coverage at [email or web address], password [facility] [facility] 07/21/2018, 8:20 AM  LOS: 2 days

## 2018-07-21 NOTE — Progress Notes (Signed)
  Speech Language Pathology Treatment: Dysphagia  Patient Details Name: Micheal Nicholson MRN: [id number] DOB: [birth year] Today's Date: 07/21/2018 Time: 1771-1657 SLP Time Calculation (min) (ACUTE ONLY): 10 min  Assessment / Plan / Recommendation Clinical Impression  Pt initially verbally responding to SLP's questions, kept eyes closed. Repositioned with assistance of sitter at bedside. Once HOB elevated pt became sleepy requiring increased cueing. Tactile cues and hand over hand support with cup provided in attempts to increase automacity and alertness for feeding (no straws) without success. SLP examined oral cavity and noted pt with one lower tooth and scattered upper. No family present to comment on mastication. Downgraded diet to Dys 3 due to episodes of decreased alertness. ST will continue to follow and provide education for safe swallow strategies and pt's aspiration risk.   HPI HPI: Pt is a 84 y.o. [employer] War [employer] veteran with history h/o atrial fibrillation, hypertension, combined systolic and diastolic heart failure, CAD, CVA, BPH, chronic kidney disease, chronic anxiety presents with recurrent falls. CXR concerning for RLL PNA, therefore swallow evaluation was ordered. Prior BSEs in March and September 2017 were both completed after episodes of coughing with intake. Pt was felt to be at risk for episodic aspiration that did not warrant diet modifications or SLP f/u.      SLP Plan  Continue with current plan of care       Recommendations  Diet recommendations: Dysphagia 3 (mechanical soft);Thin liquid Liquids provided via: Cup;No straw Medication Administration: Crushed with puree Supervision: Staff to assist with self feeding;Full supervision/cueing for compensatory strategies Compensations: Minimize environmental distractions;Slow rate;Small sips/bites Postural Changes and/or Swallow Maneuvers: Seated upright 90 degrees                Oral Care Recommendations: Oral care  BID Follow up Recommendations: 24 hour supervision/assistance SLP Visit Diagnosis: Dysphagia, unspecified (R13.10) Plan: Continue with current plan of care       GO                Houston Siren 07/21/2018, 4:08 PM   Orbie Pyo Colvin Caroli.Ed [employer] [phone]

## 2018-07-22 LAB — URINE CULTURE: CULTURE: NO GROWTH

## 2018-07-22 LAB — BASIC METABOLIC PANEL
ANION GAP: 11 (ref 5–15)
BUN: 24 mg/dL — AB (ref 8–23)
CALCIUM: 9 mg/dL (ref 8.9–10.3)
CO2: 21 mmol/L — AB (ref 22–32)
Chloride: 110 mmol/L (ref 98–111)
Creatinine, Ser: 1.42 mg/dL — ABNORMAL HIGH (ref 0.61–1.24)
GFR calc Af Amer: 46 mL/min — ABNORMAL LOW (ref >60)
GFR, EST NON AFRICAN AMERICAN: 40 mL/min — AB (ref >60)
Glucose, Bld: 115 mg/dL — ABNORMAL HIGH (ref 70–99)
POTASSIUM: 4.2 mmol/L (ref 3.5–5.1)
Sodium: 142 mmol/L (ref 135–145)

## 2018-07-22 LAB — BRAIN NATRIURETIC PEPTIDE: B Natriuretic Peptide: 1206.5 pg/mL — ABNORMAL HIGH (ref 0.0–100.0)

## 2018-07-22 LAB — PROCALCITONIN

## 2018-07-22 LAB — URIC ACID: Uric Acid, Serum: 6.2 mg/dL (ref 3.7–8.6)

## 2018-07-22 MED ORDER — METHYLPREDNISOLONE SODIUM SUCC 125 MG IJ SOLR
60.0000 mg | Freq: Once | INTRAMUSCULAR | Status: AC
  Administered 2018-07-22: 60 mg via INTRAVENOUS
  Filled 2018-07-22: qty 2

## 2018-07-22 MED ORDER — VITAMIN B-1 100 MG PO TABS
100.0000 mg | ORAL_TABLET | Freq: Every day | ORAL | Status: DC
  Administered 2018-07-22 – 2018-07-24 (×3): 100 mg via ORAL
  Filled 2018-07-22 (×3): qty 1

## 2018-07-22 MED ORDER — PREDNISONE 50 MG PO TABS
50.0000 mg | ORAL_TABLET | Freq: Every day | ORAL | Status: DC
  Administered 2018-07-23 – 2018-07-24 (×2): 50 mg via ORAL
  Filled 2018-07-22 (×2): qty 1

## 2018-07-22 NOTE — Clinical Social Work Note (Signed)
Clinical Social Work Assessment  Patient Details  Name: Micheal Nicholson MRN: [id number] Date of Birth: January 11, 1920  Date of referral:  07/22/18               Reason for consult:  Facility Placement                Permission sought to share information with:  Facility Sport and exercise psychologist, Family Supports Permission granted to share information::  Yes, Verbal Permission Granted  Name::     Gene  Agency::  [facility]  Relationship::  Son  Contact Information:     Housing/Transportation Living arrangements for the past 2 months:  Single Family Home Source of Information:  Adult Children, Medical Team Patient Interpreter Needed:  None Criminal Activity/Legal Involvement Pertinent to Current Situation/Hospitalization:  No - Comment as needed Significant Relationships:  Adult Children Lives with:  Self Do you feel safe going back to the place where you live?  Yes Need for family participation in patient care:  Yes (Comment)  Care giving concerns:  Patient from home alone, will benefit from short term rehab at discharge.   Social Worker assessment / plan:  CSW contacted patient's son to discuss recommendation for SNF. CSW confirmed availability at [facility], and they will initiate insurance authorization. CSW to follow.  Employment status:  Retired Nurse, adult PT Recommendations:  [place] / Referral to community resources:  [place]  Patient/Family's Response to care:  Patient's son agreeable to SNF placement.  Patient/Family's Understanding of and Emotional Response to Diagnosis, Current Treatment, and Prognosis:  Patient's son discussed how the patient needs additional assistance at discharge, and hopeful for [facility]. Patient's son appreciative of care received at hospital.  Emotional Assessment Appearance:  Appears stated age Attitude/Demeanor/Rapport:  Unable to Assess Affect (typically  observed):  Unable to Assess Orientation:  Oriented to Self, Oriented to Place Alcohol / Substance use:  Not Applicable Psych involvement (Current and /or in the community):  No (Comment)  Discharge Needs  Concerns to be addressed:  Care Coordination Readmission within the last 30 days:  No Current discharge risk:  Dependent with Mobility, Lives alone Barriers to Discharge:  Continued Medical Work up, Ship broker, Requiring sitter/restraints   Geralynn Ochs, [place] 07/22/2018, 4:23 PM

## 2018-07-22 NOTE — Progress Notes (Signed)
SLP Cancellation Note  Patient Details Name: [name] MRN: [id number] DOB: 1920-06-12   Cancelled treatment:       Reason Eval/Treat Not Completed: Fatigue/lethargy limiting ability to participate. Consult with MD regarding pt current status. Pt currently lethargic. No family present at this time for education. ST will continue efforts.  Dominic Mahaney B. [facility], CCC-SLP Speech Language Pathologist [phone]  Shonna Chock 07/22/2018, 2:25 PM

## 2018-07-22 NOTE — NC FL2 (Signed)
[place] MEDICAID FL2 LEVEL OF CARE SCREENING TOOL     IDENTIFICATION  Patient Name: Micheal Nicholson Birthdate: 1920-03-06 Sex: male Admission Date (Current Location): 07/19/2018  [facility] and [place] Number:  Herbalist and Address:  [facility] [place], [place] [address], [place], [place] [address]      Provider Number: [id number]  Attending Physician Name and Address:  Florencia Reasons, MD  Relative Name and Phone Number:       Current Level of Care: Hospital Recommended Level of Care: [place] Prior Approval Number:    Date Approved/Denied:   PASRR Number: [id number]  Discharge Plan: SNF    Current Diagnoses: Patient Active Problem List   Diagnosis Date Noted  . Metabolic encephalopathy 26/37/8588  . At high risk for falls 03/26/2017  . Acute on chronic systolic CHF (congestive heart failure) ([place]) 10/08/2016  . Acute respiratory failure with hypoxia ([place]) 10/08/2016  . CKD (chronic kidney disease), stage III ([place]) 10/08/2016  . Sacral fracture, closed ([place]) 10/08/2016  . Hypoxia   . Hypokalemia 08/24/2016  . Community acquired pneumonia 08/23/2016  . Pruritus 08/09/2016  . Chronic anticoagulation 04/28/2016  . Cardiac pacemaker in situ 04/28/2016  . Chronic systolic congestive heart failure ([place]) 04/28/2016  . Left hemiparesis ([place]) 04/25/2016  . Encounter for therapeutic drug monitoring 04/19/2016  . Cardiomyopathy, ischemic   . Cerebral embolism with cerebral infarction 03/03/2016  . Normocytic anemia   . Thrombocytopenia ([place])   . Acute left hemiparesis ([place])   . Peripheral neuropathic pain   . Hemiplegia ([place]) 03/02/2016  . Adenopathy   . Long term current use of anticoagulant therapy 01/23/2016  . CAD (coronary artery disease) 01/23/2016  . Second degree [place] II AV block 01/13/2016  . Persistent atrial fibrillation ([place])   . Atrial fibrillation with slow ventricular response ([place])   . AKI (acute kidney  injury) ([place])   . Bradycardia 12/29/2015  . Acute renal failure (ARF) ([place]) 12/29/2015  . Hyponatremia 12/29/2015  . Hypotension 12/29/2015  . Elevated troponin 12/29/2015  . Acute upper respiratory infection 12/23/2015  . RLS (restless legs syndrome) 09/09/2015  . Chronic combined systolic and diastolic CHF, NYHA class 2 ([place]) 04/17/2015  . Vitamin D deficiency 03/11/2015  . Cardiomyopathy -etiology uncertain 50/27/7412  . Paroxysmal atrial fibrillation ([place]) 08/27/2014  . 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 H/O: stroke 08/27/2014  . GERD (gastroesophageal reflux disease) 02/07/2011  . BPH (benign prostatic hypertrophy) 07/12/2010  . Anxiety state 11/02/2009  . Essential hypertension 11/02/2009    Orientation RESPIRATION BLADDER Height & Weight     Self, Place  Normal Incontinent Weight: 156 lb 15.5 oz (71.2 kg) Height:  5\' 10"  (177.8 cm)  BEHAVIORAL SYMPTOMS/MOOD NEUROLOGICAL BOWEL NUTRITION STATUS      Continent Diet(mechanical soft)  AMBULATORY STATUS COMMUNICATION OF NEEDS Skin   Limited Assist Verbally Skin abrasions(left elbow, foam dressing)                       Personal Care Assistance Level of Assistance  Bathing, Feeding, Dressing Bathing Assistance: Limited assistance Feeding assistance: Limited assistance Dressing Assistance: Limited assistance     Functional Limitations Info  Sight, Hearing, Speech Sight Info: Adequate Hearing Info: Adequate Speech Info: Adequate    SPECIAL CARE FACTORS FREQUENCY  OT (By licensed OT), PT (By licensed PT), Speech therapy     PT Frequency: 5x/wk OT Frequency: 5x/wk     Speech Therapy Frequency: 5x/wk  Contractures Contractures Info: Not present    Additional Factors Info  Code Status, Allergies, Psychotropic Code Status Info: DNR Allergies Info: Chocolate Psychotropic Info: Zoloft 25mg  daily         Current Medications (07/22/2018):  This is the current hospital active medication list Current  Facility-Administered Medications  Medication Dose Route Frequency Provider Last Rate Last Dose  . acetaminophen (TYLENOL) tablet 325 mg  325 mg Oral Q6H PRN Guilford Shi, MD   325 mg at 07/21/18 0450  . amoxicillin-clavulanate (AUGMENTIN) 500-125 MG per tablet 500 mg  500 mg Oral BID Florencia Reasons, MD   500 mg at 07/22/18 1047  . apixaban (ELIQUIS) tablet 2.5 mg  2.5 mg Oral BID Guilford Shi, MD   2.5 mg at 07/22/18 1045  . bisacodyl (DULCOLAX) EC tablet 5 mg  5 mg Oral Daily PRN Guilford Shi, MD      . carvedilol (COREG) tablet 3.125 mg  3.125 mg Oral BID WC Guilford Shi, MD   3.125 mg at 07/22/18 1050  . Chlorhexidine Gluconate Cloth 2 % PADS 6 each  6 each Topical Q0600 Florencia Reasons, MD   6 each at 07/22/18 [id number]  . docusate sodium (COLACE) capsule 100 mg  100 mg Oral BID Guilford Shi, MD   100 mg at 07/22/18 1045  . doxycycline (VIBRA-TABS) tablet 100 mg  100 mg Oral Q12H Guilford Shi, MD   100 mg at 07/22/18 1049  . lisinopril (PRINIVIL,ZESTRIL) tablet 5 mg  5 mg Oral Daily Guilford Shi, MD   5 mg at 07/22/18 1049  . LORazepam (ATIVAN) injection 0.5 mg  0.5 mg Intravenous Q6H PRN Florencia Reasons, MD   0.5 mg at 07/21/18 1029  . LORazepam (ATIVAN) injection 0.5 mg  0.5 mg Intravenous QHS Florencia Reasons, MD   0.5 mg at 07/21/18 2151  . mupirocin ointment (BACTROBAN) 2 % 1 application  1 application Nasal BID Florencia Reasons, MD   1 application at 26/37/85 1041  . pantoprazole (PROTONIX) EC tablet 40 mg  40 mg Oral Daily Guilford Shi, MD   40 mg at 07/22/18 1051  . pramipexole (MIRAPEX) tablet 0.25 mg  0.25 mg Oral QHS Guilford Shi, MD   0.25 mg at 07/21/18 2152  . senna-docusate (Senokot-S) tablet 1 tablet  1 tablet Oral QHS PRN Guilford Shi, MD      . sertraline (ZOLOFT) tablet 25 mg  25 mg Oral Daily Guilford Shi, MD   25 mg at 07/22/18 1044  . tamsulosin (FLOMAX) capsule 0.4 mg  0.4 mg Oral BID Florencia Reasons, MD   0.4 mg at 07/22/18 1044  . thiamine (VITAMIN B-1) tablet  100 mg  100 mg Oral Daily Florencia Reasons, MD   100 mg at 07/22/18 1050     Discharge Medications: Please see discharge summary for a list of discharge medications.  Relevant Imaging Results:  Relevant Lab Results:   Additional Information SS#: [id number]  Geralynn Ochs, LCSW

## 2018-07-22 NOTE — Discharge Instructions (Signed)
Information on my medicine - ELIQUIS (apixaban)  You were on this medication, ELiquis (Apixaban) prior to this hospital admission.   This medication education was reviewed with me or my healthcare representative as part of my discharge preparation.   Why was Eliquis prescribed for you? Eliquis was prescribed for you to reduce the risk of a blood clot forming that can cause a stroke if you have a medical condition called atrial fibrillation (a type of irregular heartbeat).  What do You need to know about Eliquis ? Take your Eliquis TWICE DAILY - one tablet in the morning and one tablet in the evening with or without food. If you have difficulty swallowing the tablet whole please discuss with your pharmacist how to take the medication safely.  Take Eliquis exactly as prescribed by your doctor and DO NOT stop taking Eliquis without talking to the doctor who prescribed the medication.  Stopping may increase your risk of developing a stroke.  Refill your prescription before you run out.  After discharge, you should have regular check-up appointments with your healthcare provider that is prescribing your Eliquis.  In the future your dose may need to be changed if your kidney function or weight changes by a significant amount or as you get older.  What do you do if you miss a dose? If you miss a dose, take it as soon as you remember on the same day and resume taking twice daily.  Do not take more than one dose of ELIQUIS at the same time to make up a missed dose.  Important Safety Information A possible side effect of Eliquis is bleeding. You should call your healthcare provider right away if you experience any of the following: ? Bleeding from an injury or your nose that does not stop. ? Unusual colored urine (red or dark brown) or unusual colored stools (red or black). ? Unusual bruising for unknown reasons. ? A serious fall or if you hit your head (even if there is no bleeding).  Some  medicines may interact with Eliquis and might increase your risk of bleeding or clotting while on Eliquis. To help avoid this, consult your healthcare provider or pharmacist prior to using any new prescription or non-prescription medications, including herbals, vitamins, non-steroidal anti-inflammatory drugs (NSAIDs) and supplements.  This website has more information on Eliquis (apixaban): http://www.eliquis.com/eliquis/home

## 2018-07-22 NOTE — Progress Notes (Addendum)
PROGRESS NOTE  Micheal Nicholson FKC:[id number] DOB: May 15, 1920 DOA: 07/19/2018 PCP: [name], MD   Shortness of breath. URI symptom with productive cough,  Increased weakness and falls for 1 week. she was seen by pmd on 7/18 then 7/26 for this, she is sent to ED for eval on 7/26 and got admitted for FTT and possible pna   HPI/Recap of past 24 hours:   He did not require sitter last night He is sleepy this am   He had urinary retention required in and out cath last night   Assessment/Plan: Active Problems:   Metabolic encephalopathy  CAP -cxr with "Patchy right basilar opacity could reflect superimposed" -Family reported patient has been having congested cough and right lower chest pain at home -No hypoxia, no fever, no leukocytosis -procalcitonin less than 0.1, urine strep antigen negative, sputum culture not collected, respiratory viral panel negtive, mrsa screening +,  He did well with swallow eval iniitally, now he is lethargic -d/c rocephin, change to augmentin, continue doxycycline,    Combined systolic and diastolic CHF -Last ef from 2017 35-40% -cxr "Cardiomegaly and mild interstitial edema" -no wheezing or rales on exam, no edema -close monitor volume status  Afib with bradycardia s/p pacemaker ([employer] Jude single-chamber permanent pacemaker in January 2017 (Dr. Rayann Heman). CHADSVasc 7 (age 73, CVA 2, CAD, HTN, CHF) Rate controlled on coreg, apixaban I have discussed with the family about risk and benefit of apixaban, with increased fall . 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), vascular dementia  Frequent falls/FTT/increase confusion I have discussed with family and HPOA who agreed to snf placement  Right knee pain: Right knee x ray , no fractures, + "There is a prominent joint effusion in the suprapatellar  recess." Uric acid 6.2 Radiology wants patient to be off elliquis for 2 days, before able to consider knee tap I have discussed with radiology about the joint effusion today, per radiology the effusion is not large Will try steroids for now, prn analgesics Hold elliquis , if patient continue to be in the hospital will proceed with radiology imaging guided thoracentesis, otherwise follow up with ortho Dr Nelva Bush.   Urinary retention: Not a candidate for indwelling foley due to confusion Increase flomax from daily to bid,  Bladder scan and in and out cath prn  HTN Stable on current regimen  CKDIII Stable at baseline Renal dosing meds   Delirium: Improved with scheduled low dose ativan at night    Code Status: DNR  Family Communication: patient and family at bedside on 7/28  Disposition Plan: SNF, likely on 7/30   Consultants:  cardiology  Procedures:  none  Antibiotics:  Doxycycline  Since admission  rocephin from admission ot 7/28   Objective: BP (!) 147/67   Pulse 60   Temp (!) 97.5 F (36.4 C) (Oral)   Resp 19   Ht 5\' 10"  (1.778 m)   Wt 71.2 kg (156 lb 15.5 oz)   SpO2 (!) 84%   BMI 22.52 kg/m   Intake/Output Summary (Last 24 hours) at 07/22/2018 1259 Last data filed at 07/21/2018 1455 Gross per 24 hour  Intake 240 ml  Output -  Net 240 ml   Filed Weights   07/19/18 1304 07/19/18 1931  Weight: 68 kg (150 lb) 71.2 kg (156 lb 15.5 oz)    Exam:  Patient is examined daily including today on 07/22/2018, exams remain the same as of yesterday except that has changed    General:   Very Frail, hard of hearing, thin, confused and lethargic this am  Cardiovascular: paced rhythm  Respiratory: CTABL  Abdomen: Soft/ND/NT, positive BS  Musculoskeletal: No Edema Neuro: he is very lethargic today  Data Reviewed: Basic Metabolic Panel: Recent Labs  Lab 07/19/18 1335 07/19/18 1831 07/21/18 0715 07/22/18 0612  NA 140  --  139 142  K 4.3  --  4.4  4.2  CL 107  --  108 110  CO2 23  --  22 21*  GLUCOSE 90  --  108* 115*  BUN 24*  --  25* 24*  CREATININE 1.50* 1.47* 1.55* 1.42*  CALCIUM 8.9  --  8.9 9.0   Liver Function Tests: Recent Labs  Lab 07/19/18 1335 07/21/18 0715  AST 20 26  ALT 15 16  ALKPHOS 51 54  BILITOT 1.7* 2.5*  PROT 6.4* 6.2*  ALBUMIN 3.5 3.2*   No results for input(s): LIPASE, AMYLASE in the last 168 hours. No results for input(s): AMMONIA in the last 168 hours. CBC: Recent Labs  Lab 07/19/18 1335 07/19/18 1831 07/21/18 0715  WBC 4.0 3.3* 5.8  NEUTROABS 3.0  --  4.5  HGB 11.7* 11.2* 12.8*  HCT 37.5* 35.2* 39.1  MCV 91.7 91.4 88.7  PLT 183 170 214   Cardiac Enzymes:   Recent Labs  Lab 07/19/18 1335  TROPONINI <0.03   BNP (last 3 results) Recent Labs    07/19/18 1335 07/22/18 0612  BNP 748.8* 1,206.5*    ProBNP (last 3 results) No results for input(s): PROBNP in the last 8760 hours.  CBG: No results for input(s): GLUCAP in the last 168 hours.  Recent Results (from the past 240 hour(s))  Blood culture (routine x 2)     Status: None (Preliminary result)   Collection Time: 07/19/18  2:30 PM  Result Value Ref Range Status   Specimen Description BLOOD SITE NOT SPECIFIED  Final   Special Requests   Final    BOTTLES DRAWN AEROBIC AND ANAEROBIC Blood Culture adequate volume   Culture   Final    NO GROWTH 2 DAYS Performed at [facility], [address] [place], [place] [address]    Report Status PENDING  Incomplete  Blood culture (routine x 2)     Status: None (Preliminary result)   Collection Time: 07/19/18  6:31 PM  Result Value Ref Range Status   Specimen Description BLOOD LEFT ANTECUBITAL  Final   Special Requests   Final    BOTTLES DRAWN AEROBIC AND ANAEROBIC Blood Culture adequate volume   Culture   Final    NO GROWTH 2 DAYS Performed at [facility], [place] [address], [place], [place] [address]    Report Status PENDING  Incomplete  MRSA PCR Screening      Status: Abnormal   Collection Time: 07/20/18 12:28 PM  Result Value Ref Range Status   MRSA by PCR POSITIVE (A) NEGATIVE Final    Comment:        The GeneXpert MRSA Assay (FDA approved for NASAL specimens only), is one component of a comprehensive MRSA colonization surveillance program. It is not intended to diagnose MRSA infection nor to guide or monitor treatment for MRSA infections. RESULT CALLED TO, READ BACK BY AND VERIFIED WITH: [facility] RN AT 1425 07/20/18 BY A.DAVIS Performed at [facility], [place]  [address] [place], [place] [address]   Respiratory Panel by PCR     Status: None   Collection Time: 07/20/18 10:27 PM  Result Value Ref Range Status   Adenovirus NOT DETECTED NOT DETECTED Final   Coronavirus 229E NOT DETECTED NOT DETECTED Final   Coronavirus HKU1 NOT DETECTED NOT DETECTED Final   Coronavirus NL63 NOT DETECTED NOT DETECTED Final   Coronavirus OC43 NOT DETECTED NOT DETECTED Final   Metapneumovirus NOT DETECTED NOT DETECTED Final   Rhinovirus / Enterovirus NOT DETECTED NOT DETECTED Final   Influenza A NOT DETECTED NOT DETECTED Final   Influenza B NOT DETECTED NOT DETECTED Final   Parainfluenza Virus 1 NOT DETECTED NOT DETECTED Final   Parainfluenza Virus 2 NOT DETECTED NOT DETECTED Final   Parainfluenza Virus 3 NOT DETECTED NOT DETECTED Final   Parainfluenza Virus 4 NOT DETECTED NOT DETECTED Final   Respiratory Syncytial Virus NOT DETECTED NOT DETECTED Final   Bordetella pertussis NOT DETECTED NOT DETECTED Final   Chlamydophila pneumoniae NOT DETECTED NOT DETECTED Final   Mycoplasma pneumoniae NOT DETECTED NOT DETECTED Final    Comment: Performed at [facility], [place] [address] [place], [place] [address]  Urine culture     Status: None   Collection Time: 07/21/18  5:42 AM  Result Value Ref Range Status   Specimen Description URINE, RANDOM  Final   Special Requests NONE  Final   Culture   Final    NO GROWTH Performed at [facility],  [place] [address], [place], [place] [address]    Report Status 07/22/2018 FINAL  Final     Studies: No results found.  Scheduled Meds: . amoxicillin-clavulanate  500 mg Oral BID  . apixaban  2.5 mg Oral BID  . carvedilol  3.125 mg Oral BID WC  . Chlorhexidine Gluconate Cloth  6 each Topical Q0600  . docusate sodium  100 mg Oral BID  . doxycycline  100 mg Oral Q12H  . lisinopril  5 mg Oral Daily  . LORazepam  0.5 mg Intravenous QHS  . mupirocin ointment  1 application Nasal BID  . pantoprazole  40 mg Oral Daily  . pramipexole  0.25 mg Oral QHS  . sertraline  25 mg Oral Daily  . tamsulosin  0.4 mg Oral BID  . thiamine  100 mg Oral Daily    Continuous Infusions:    Time spent: 65mins,  I have personally reviewed and interpreted on  07/22/2018 daily labs,  imagings as discussed above under date review session and assessment and plans.  I reviewed all nursing notes, pharmacy notes, vitals, pertinent old records  I have discussed plan of care as described above with RN , patient  on 07/22/2018   Florencia Reasons MD, PhD  [facility] Pager [phone]. If 7PM-7AM, please contact night-coverage at [email or web address], password [facility] 07/22/2018, 12:59 PM  LOS: 3 days

## 2018-07-23 LAB — AMMONIA: AMMONIA: 22 umol/L (ref 9–35)

## 2018-07-23 LAB — COMPREHENSIVE METABOLIC PANEL
ALT: 17 U/L (ref 0–44)
ANION GAP: 8 (ref 5–15)
AST: 24 U/L (ref 15–41)
Albumin: 3.3 g/dL — ABNORMAL LOW (ref 3.5–5.0)
Alkaline Phosphatase: 54 U/L (ref 38–126)
BUN: 26 mg/dL — ABNORMAL HIGH (ref 8–23)
CHLORIDE: 111 mmol/L (ref 98–111)
CO2: 21 mmol/L — ABNORMAL LOW (ref 22–32)
Calcium: 9 mg/dL (ref 8.9–10.3)
Creatinine, Ser: 1.4 mg/dL — ABNORMAL HIGH (ref 0.61–1.24)
GFR, EST AFRICAN AMERICAN: 47 mL/min — AB (ref >60)
GFR, EST NON AFRICAN AMERICAN: 41 mL/min — AB (ref >60)
Glucose, Bld: 113 mg/dL — ABNORMAL HIGH (ref 70–99)
POTASSIUM: 4.2 mmol/L (ref 3.5–5.1)
Sodium: 140 mmol/L (ref 135–145)
Total Bilirubin: 2.7 mg/dL — ABNORMAL HIGH (ref 0.3–1.2)
Total Protein: 6.2 g/dL — ABNORMAL LOW (ref 6.5–8.1)

## 2018-07-23 MED ORDER — FUROSEMIDE 40 MG PO TABS: 40.0000 mg | ORAL_TABLET | Freq: Every day | ORAL | Status: DC

## 2018-07-23 MED ORDER — FUROSEMIDE 10 MG/ML IJ SOLN
40.0000 mg | Freq: Once | INTRAMUSCULAR | Status: AC
  Administered 2018-07-23: 40 mg via INTRAVENOUS
  Filled 2018-07-23: qty 4

## 2018-07-23 MED ORDER — FUROSEMIDE 40 MG PO TABS
40.0000 mg | ORAL_TABLET | Freq: Every day | ORAL | Status: DC
Start: 2018-07-24 — End: 2018-07-25
  Administered 2018-07-24: 40 mg via ORAL
  Filled 2018-07-23: qty 1

## 2018-07-23 NOTE — Progress Notes (Signed)
Physical Therapy Treatment Patient Details Name: Micheal Nicholson MRN: [id number] DOB: [birth year] Today's Date: 07/23/2018    History of Present Illness Pt is a 84 y/o male [employer] veteran admitted secondary to falls. Pt found to have RLL PNA. PMH including but not limited to atrial fibrillation, hypertension, combined systolic and diastolic heart failure, CAD, history of CVA, BPH, chronic kidney disease, chronic anxiety.    PT Comments    Patient received in bed. PT session focusing on improving safe functional mobility, with session limited due to fatigue. Patient today requiring up to Mod A +2 for transfers for safety with max cueing for sequencing and stability once in sitting and standing upright.  PT to continue to follow acutely.     Follow Up Recommendations  SNF     Equipment Recommendations  None recommended by PT    Recommendations for Other Services       Precautions / Restrictions Precautions Precautions: Fall Restrictions Weight Bearing Restrictions: No    Mobility  Bed Mobility Overal bed mobility: Needs Assistance Bed Mobility: Supine to Sit     Supine to sit: Mod assist     General bed mobility comments: assist to bring LE towards EOB, assist for trunk control to sit upright  Transfers Overall transfer level: Needs assistance Equipment used: 2 person hand held assist Transfers: Sit to/from Stand;Stand Pivot Transfers Sit to Stand: Mod assist;+2 physical assistance Stand pivot transfers: Mod assist;+2 physical assistance       General transfer comment: patient talking while seated EOB, but with eyes closed. Nursing present to assist with transfer. Mod A today - nursing stated that patient had Ativan last night and may be the reason for increased assistance  Ambulation/Gait             General Gait Details: deferred   Stairs             Wheelchair Mobility    Modified Rankin (Stroke Patients Only)       Balance Overall  balance assessment: Needs assistance;History of Falls Sitting-balance support: Feet supported Sitting balance-Leahy Scale: Poor   Postural control: Posterior lean Standing balance support: Bilateral upper extremity supported;During functional activity Standing balance-Leahy Scale: Poor                              Cognition Arousal/Alertness: Awake/alert;Lethargic Behavior During Therapy: Restless Overall Cognitive Status: No family/caregiver present to determine baseline cognitive functioning                                 General Comments: upon PT arrival, patient throwing legs off EOB; difficulty following 1 step commads today      Exercises      General Comments        Pertinent Vitals/Pain Pain Assessment: Faces Faces Pain Scale: Hurts even more Pain Location: generalized LE with movement Pain Intervention(s): Limited activity within patient's tolerance;Monitored during session;Repositioned    Home Living                      Prior Function            PT Goals (current goals can now be found in the care plan section) Acute Rehab PT Goals Patient Stated Goal: to go home ASAP PT Goal Formulation: With patient Time For Goal Achievement: 08/03/18 Potential to Achieve Goals: Fair Progress towards PT  goals: Progressing toward goals    Frequency    Min 3X/week      PT Plan Current plan remains appropriate    Co-evaluation              AM-PAC PT "6 Clicks" Daily Activity  Outcome Measure  Difficulty turning over in bed (including adjusting bedclothes, sheets and blankets)?: A Little Difficulty moving from lying on back to sitting on the side of the bed? : Unable Difficulty sitting down on and standing up from a chair with arms (e.g., wheelchair, bedside commode, etc,.)?: Unable Help needed moving to and from a bed to chair (including a wheelchair)?: A Lot Help needed walking in hospital room?: A Lot Help needed  climbing 3-5 steps with a railing? : Total 6 Click Score: 10    End of Session Equipment Utilized During Treatment: Gait belt Activity Tolerance: Patient limited by lethargy Patient left: in chair;with call bell/phone within reach;with chair alarm set;with nursing/sitter in room Nurse Communication: Mobility status PT Visit Diagnosis: Other abnormalities of gait and mobility (R26.89)     Time: 1610-9604 PT Time Calculation (min) (ACUTE ONLY): 25 min  Charges:  $Therapeutic Activity: 23-37 mins                     Lanney Gins, PT, DPT 07/23/18 3:40 PM Pager: [phone]

## 2018-07-23 NOTE — Progress Notes (Signed)
CSW still awaiting insurance auth for [facility].   Cedric Fishman LCSW [phone]'

## 2018-07-23 NOTE — Progress Notes (Signed)
  Speech Language Pathology Treatment: Dysphagia  Patient Details Name: Micheal Nicholson MRN: [id number] DOB: [birth year] Today's Date: 07/23/2018 Time: 5643-3295 SLP Time Calculation (min) (ACUTE ONLY): 26 min  Assessment / Plan / Recommendation Clinical Impression  Pt received Ativan last night and initially very lethargic with increasing alertness with tactile/verbal frequent cueing. Noted expectorated mucous on side of face and moist mucous beginning to develop on hard palate. SLP set up suction, cleaned oral cavity and used ice chips to loosen. As his alertness improved so did his volitional and reflexive cough. RN arrived for meds and initially recommended no po's/meds until alertness improved however RN can proceed with meds now and hold off on meal until more alert. This therapist downgraded diet texture to Dys 2.    HPI HPI: Pt is a 84 y.o. World War II veteran with history h/o atrial fibrillation, hypertension, combined systolic and diastolic heart failure, CAD, CVA, BPH, chronic kidney disease, chronic anxiety presents with recurrent falls. CXR concerning for RLL PNA, therefore swallow evaluation was ordered. Prior BSEs in March and September 2017 were both completed after episodes of coughing with intake. Pt was felt to be at risk for episodic aspiration that did not warrant diet modifications or SLP f/u.      SLP Plan  Continue with current plan of care       Recommendations  Diet recommendations: Dysphagia 2 (fine chop);Thin liquid Liquids provided via: Cup;No straw Medication Administration: Crushed with puree Supervision: Staff to assist with self feeding;Full supervision/cueing for compensatory strategies Compensations: Minimize environmental distractions;Slow rate;Small sips/bites;Lingual sweep for clearance of pocketing Postural Changes and/or Swallow Maneuvers: Seated upright 90 degrees                Oral Care Recommendations: Oral care BID Follow up  Recommendations: 24 hour supervision/assistance;Skilled Nursing facility SLP Visit Diagnosis: Dysphagia, unspecified (R13.10) Plan: Continue with current plan of care                      Houston Siren 07/23/2018, 10:10 AM   Orbie Pyo Colvin Caroli.[employer] [phone]

## 2018-07-23 NOTE — Progress Notes (Signed)
PROGRESS NOTE  Micheal Nicholson ZOX:[id number] DOB: Jun 03, 1940 DOA: 07/19/2018 PCP: [name], MD  Brief Summary:  Patient has Shortness of breath. URI symptom with productive cough,  Increased weakness and falls for 1 week.  he was seen by pmd on 7/18 then 7/26 for this, he is sent to ED for eval on 7/26 and got admitted for FTT and possible pna   HPI/Recap of past 24 hours:   He did not require sitter last night He is drowsy  this am  He has productive cough, required oral suction this am He has no fever, no hypoxia, he denies chest pain  Assessment/Plan: Active Problems:   Metabolic encephalopathy  CAP -cxr with "Patchy right basilar opacity could reflect superimposed" -Family reported patient has been having congested cough and right lower chest pain at home -No hypoxia, no fever, no leukocytosis -procalcitonin less than 0.1, urine strep antigen negative, sputum culture not collected, respiratory viral panel negtive, mrsa screening +,  He did well with swallow eval iniitally, now he is lethargic -d/c rocephin, change to augmentin, continue doxycycline,    Combined systolic and diastolic CHF -Last ef from 2017 35-40% -cxr "Cardiomegaly and mild interstitial edema" -no wheezing or rales on exam, no edema -home lasix held initially, resumed on 7/30  Afib with bradycardia s/p pacemaker ([employer] single-chamber permanent pacemaker in January 2017 (Dr. Rayann Heman). CHADSVasc 7 (age 84, CVA 2, CAD, HTN, CHF) Rate controlled on coreg I have discussed with the family about risk and benefit of apixaban, with increased fall . 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   Urinary retention: Not a candidate for indwelling foley due to confusion Increase flomax from daily to bid,  Bladder scan and in and out cath prn  HTN Stable on current regimen  CKDIII Stable at baseline Renal dosing meds   Delirium: Improved with scheduled low dose ativan at night    Code Status: DNR  Family Communication: patient and family at bedside on 7/28  Disposition Plan: SNF, likely on 7/31   Consultants:  Cardiology  Radiology for fluro guided arthrocentesis right knee  Procedures: fluro guided arthrocentesis right knee on 7/31  Antibiotics:  Doxycycline  And augmentin  rocephin from admission ot 7/28   Objective: BP (!) 137/92 (BP Location: Right Arm)   Pulse 63   Temp 98.2 F (36.8 C) (Oral)   Resp (!) 22   Ht 5\' 10"  (1.778 m)   Wt 71.2 kg (156 lb 15.5 oz)   SpO2 91%   BMI 22.52 kg/m   Intake/Output Summary (Last 24 hours) at 07/23/2018 1708 Last data filed at 07/23/2018 1500 Gross per 24 hour  Intake -  Output 350 ml  Net -350 ml   Filed Weights   07/19/18 1304 07/19/18 1931  Weight: 68 kg (150 lb) 71.2 kg (156 lb 15.5 oz)    Exam: Patient is examined daily including today on  07/23/2018, exams remain the same as of yesterday except that has changed    General:   Very Frail, hard of hearing, thin, confused and lethargic this am  Cardiovascular: paced rhythm  Respiratory: CTABL  Abdomen: Soft/ND/NT, positive BS  Musculoskeletal: No Edema Neuro: he is very lethargic today  Data Reviewed: Basic Metabolic Panel: Recent Labs  Lab 07/19/18 1335 07/19/18 1831 07/21/18 0715 07/22/18 0612 07/23/18 0628  NA 140  --  139 142 140  K 4.3  --  4.4  4.2 4.2  CL 107  --  108 110 111  CO2 23  --  22 21* 21*  GLUCOSE 90  --  108* 115* 113*  BUN 24*  --  25* 24* 26*  CREATININE 1.50* 1.47* 1.55* 1.42* 1.40*  CALCIUM 8.9  --  8.9 9.0 9.0   Liver Function Tests: Recent Labs  Lab 07/19/18 1335 07/21/18 0715 07/23/18 0628  AST 20 26 24   ALT 15 16 17   ALKPHOS 51 54 54  BILITOT 1.7* 2.5* 2.7*  PROT 6.4* 6.2* 6.2*  ALBUMIN 3.5 3.2* 3.3*   No results for input(s): LIPASE, AMYLASE in the last 168 hours. Recent Labs  Lab 07/23/18 0628  AMMONIA 22   CBC: Recent Labs  Lab 07/19/18 1335 07/19/18 1831 07/21/18 0715  WBC 4.0 3.3* 5.8  NEUTROABS 3.0  --  4.5  HGB 11.7* 11.2* 12.8*  HCT 37.5* 35.2* 39.1  MCV 91.7 91.4 88.7  PLT 183 170 214   Cardiac Enzymes:   Recent Labs  Lab 07/19/18 1335  TROPONINI <0.03   BNP (last 3 results) Recent Labs    07/19/18 1335 07/22/18 0612  BNP 748.8* 1,206.5*    ProBNP (last 3 results) No results for input(s): PROBNP in the last 8760 hours.  CBG: No results for input(s): GLUCAP in the last 168 hours.  Recent Results (from the past 240 hour(s))  Blood culture (routine x 2)     Status: None (Preliminary result)   Collection Time: 07/19/18  2:30 PM  Result Value Ref Range Status   Specimen Description BLOOD SITE NOT SPECIFIED  Final   Special Requests   Final    BOTTLES DRAWN AEROBIC AND ANAEROBIC Blood Culture adequate volume   Culture   Final    NO GROWTH 4 DAYS Performed at [facility], [address] [place], [place] [address]    Report Status PENDING  Incomplete  Blood culture (routine x 2)     Status: None (Preliminary result)   Collection Time: 07/19/18  6:31 PM  Result Value Ref Range Status   Specimen Description BLOOD LEFT ANTECUBITAL  Final   Special Requests   Final    BOTTLES DRAWN AEROBIC AND ANAEROBIC Blood Culture adequate volume   Culture   Final    NO GROWTH 4 DAYS Performed at [facility], [place] [address] [place], [place] [address]     Report Status PENDING  Incomplete  MRSA PCR Screening     Status: Abnormal   Collection Time: 07/20/18 12:28 PM  Result Value Ref Range Status   MRSA by PCR POSITIVE (A) NEGATIVE Final    Comment:        The GeneXpert MRSA Assay (FDA approved for NASAL specimens only), is one component of a comprehensive MRSA colonization surveillance program. It is not intended to diagnose MRSA infection nor to guide or monitor treatment for MRSA infections. RESULT CALLED TO, READ BACK BY AND VERIFIED WITH: L.SMITH RN AT 1275  07/20/18 BY A.DAVIS Performed at [facility] [address], [place], [place] [address]   Respiratory Panel by PCR     Status: None   Collection Time: 07/20/18 10:27 PM  Result Value Ref Range Status   Adenovirus NOT DETECTED NOT DETECTED Final   Coronavirus 229E NOT DETECTED NOT DETECTED Final   Coronavirus HKU1 NOT DETECTED NOT DETECTED Final   Coronavirus NL63 NOT DETECTED NOT DETECTED Final   Coronavirus OC43 NOT DETECTED NOT DETECTED Final   Metapneumovirus NOT DETECTED NOT DETECTED Final   Rhinovirus / Enterovirus NOT DETECTED NOT DETECTED Final   Influenza A NOT DETECTED NOT DETECTED Final   Influenza B NOT DETECTED NOT DETECTED Final   Parainfluenza Virus 1 NOT DETECTED NOT DETECTED Final   Parainfluenza Virus 2 NOT DETECTED NOT DETECTED Final   Parainfluenza Virus 3 NOT DETECTED NOT DETECTED Final   Parainfluenza Virus 4 NOT DETECTED NOT DETECTED Final   Respiratory Syncytial Virus NOT DETECTED NOT DETECTED Final   Bordetella pertussis NOT DETECTED NOT DETECTED Final   Chlamydophila pneumoniae NOT DETECTED NOT DETECTED Final   Mycoplasma pneumoniae NOT DETECTED NOT DETECTED Final    Comment: Performed at [facility], [place] [address] [place], [place] [address]  Urine culture     Status: None   Collection Time: 07/21/18  5:42 AM  Result Value Ref Range Status   Specimen Description URINE, RANDOM  Final   Special Requests NONE  Final   Culture    Final    NO GROWTH Performed at [facility], [place] [address] [place], [place] [address]    Report Status 07/22/2018 FINAL  Final     Studies: No results found.  Scheduled Meds: . amoxicillin-clavulanate  500 mg Oral BID  . carvedilol  3.125 mg Oral BID WC  . Chlorhexidine Gluconate Cloth  6 each Topical Q0600  . docusate sodium  100 mg Oral BID  . doxycycline  100 mg Oral Q12H  . furosemide  40 mg Intravenous Once  . [START ON 07/24/2018] furosemide  40 mg Oral Daily  . lisinopril  5 mg Oral Daily  . mupirocin ointment  1 application Nasal BID  . pantoprazole  40 mg Oral Daily  . pramipexole  0.25 mg Oral QHS  . predniSONE  50 mg Oral Q breakfast  . sertraline  25 mg Oral Daily  . tamsulosin  0.4 mg Oral BID  . thiamine  100 mg Oral Daily    Continuous Infusions:    Time spent: 13mins,  I have personally reviewed and interpreted on  07/23/2018 daily labs,  imagings as discussed above under date review session and assessment and plans.  I reviewed all nursing notes, pharmacy notes, vitals, pertinent old records  I have discussed plan of care as described above with RN , patient  on 07/23/2018   Florencia Reasons MD, PhD  [facility] Pager [phone]. If 7PM-7AM, please contact night-coverage at [email or web address], password [facility] 07/23/2018, 5:08 PM  LOS: 4 days

## 2018-07-24 ENCOUNTER — Inpatient Hospital Stay (HOSPITAL_COMMUNITY): Payer: Medicare HMO

## 2018-07-24 DIAGNOSIS — I5043 Acute on chronic combined systolic (congestive) and diastolic (congestive) heart failure: Secondary | ICD-10-CM | POA: Diagnosis not present

## 2018-07-24 DIAGNOSIS — R4182 Altered mental status, unspecified: Secondary | ICD-10-CM | POA: Diagnosis not present

## 2018-07-24 DIAGNOSIS — K219 Gastro-esophageal reflux disease without esophagitis: Secondary | ICD-10-CM | POA: Diagnosis not present

## 2018-07-24 DIAGNOSIS — N183 Chronic kidney disease, stage 3 (moderate): Secondary | ICD-10-CM | POA: Diagnosis not present

## 2018-07-24 DIAGNOSIS — J690 Pneumonitis due to inhalation of food and vomit: Secondary | ICD-10-CM | POA: Diagnosis not present

## 2018-07-24 DIAGNOSIS — W19XXXA Unspecified fall, initial encounter: Secondary | ICD-10-CM | POA: Diagnosis not present

## 2018-07-24 DIAGNOSIS — R2689 Other abnormalities of gait and mobility: Secondary | ICD-10-CM | POA: Diagnosis not present

## 2018-07-24 DIAGNOSIS — R0902 Hypoxemia: Secondary | ICD-10-CM | POA: Diagnosis not present

## 2018-07-24 DIAGNOSIS — F411 Generalized anxiety disorder: Secondary | ICD-10-CM | POA: Diagnosis not present

## 2018-07-24 DIAGNOSIS — Z87891 Personal history of nicotine dependence: Secondary | ICD-10-CM | POA: Diagnosis not present

## 2018-07-24 DIAGNOSIS — R000 Tachycardia, unspecified: Secondary | ICD-10-CM | POA: Diagnosis not present

## 2018-07-24 DIAGNOSIS — I5042 Chronic combined systolic (congestive) and diastolic (congestive) heart failure: Secondary | ICD-10-CM | POA: Diagnosis not present

## 2018-07-24 DIAGNOSIS — R338 Other retention of urine: Secondary | ICD-10-CM | POA: Diagnosis not present

## 2018-07-24 DIAGNOSIS — R1032 Left lower quadrant pain: Secondary | ICD-10-CM | POA: Diagnosis present

## 2018-07-24 DIAGNOSIS — R278 Other lack of coordination: Secondary | ICD-10-CM | POA: Diagnosis not present

## 2018-07-24 DIAGNOSIS — R1012 Left upper quadrant pain: Secondary | ICD-10-CM | POA: Diagnosis not present

## 2018-07-24 DIAGNOSIS — G9341 Metabolic encephalopathy: Secondary | ICD-10-CM | POA: Diagnosis not present

## 2018-07-24 DIAGNOSIS — J189 Pneumonia, unspecified organism: Secondary | ICD-10-CM | POA: Diagnosis not present

## 2018-07-24 DIAGNOSIS — R296 Repeated falls: Secondary | ICD-10-CM | POA: Diagnosis not present

## 2018-07-24 DIAGNOSIS — R001 Bradycardia, unspecified: Secondary | ICD-10-CM | POA: Diagnosis not present

## 2018-07-24 DIAGNOSIS — D696 Thrombocytopenia, unspecified: Secondary | ICD-10-CM | POA: Diagnosis not present

## 2018-07-24 DIAGNOSIS — Z8673 Personal history of transient ischemic attack (TIA), and cerebral infarction without residual deficits: Secondary | ICD-10-CM | POA: Diagnosis not present

## 2018-07-24 DIAGNOSIS — F0150 Vascular dementia without behavioral disturbance: Secondary | ICD-10-CM | POA: Diagnosis not present

## 2018-07-24 DIAGNOSIS — I2510 Atherosclerotic heart disease of native coronary artery without angina pectoris: Secondary | ICD-10-CM | POA: Diagnosis not present

## 2018-07-24 DIAGNOSIS — I482 Chronic atrial fibrillation: Secondary | ICD-10-CM | POA: Diagnosis not present

## 2018-07-24 DIAGNOSIS — Z79899 Other long term (current) drug therapy: Secondary | ICD-10-CM | POA: Diagnosis not present

## 2018-07-24 DIAGNOSIS — R195 Other fecal abnormalities: Secondary | ICD-10-CM | POA: Diagnosis not present

## 2018-07-24 DIAGNOSIS — R161 Splenomegaly, not elsewhere classified: Secondary | ICD-10-CM | POA: Diagnosis not present

## 2018-07-24 DIAGNOSIS — I10 Essential (primary) hypertension: Secondary | ICD-10-CM | POA: Diagnosis not present

## 2018-07-24 DIAGNOSIS — E46 Unspecified protein-calorie malnutrition: Secondary | ICD-10-CM | POA: Diagnosis not present

## 2018-07-24 DIAGNOSIS — R52 Pain, unspecified: Secondary | ICD-10-CM | POA: Diagnosis not present

## 2018-07-24 DIAGNOSIS — I509 Heart failure, unspecified: Secondary | ICD-10-CM | POA: Diagnosis not present

## 2018-07-24 DIAGNOSIS — M2550 Pain in unspecified joint: Secondary | ICD-10-CM | POA: Diagnosis not present

## 2018-07-24 DIAGNOSIS — Z950 Presence of cardiac pacemaker: Secondary | ICD-10-CM | POA: Diagnosis not present

## 2018-07-24 DIAGNOSIS — I639 Cerebral infarction, unspecified: Secondary | ICD-10-CM | POA: Diagnosis not present

## 2018-07-24 DIAGNOSIS — I129 Hypertensive chronic kidney disease with stage 1 through stage 4 chronic kidney disease, or unspecified chronic kidney disease: Secondary | ICD-10-CM | POA: Diagnosis not present

## 2018-07-24 DIAGNOSIS — Z7401 Bed confinement status: Secondary | ICD-10-CM | POA: Diagnosis not present

## 2018-07-24 DIAGNOSIS — M25562 Pain in left knee: Secondary | ICD-10-CM | POA: Diagnosis not present

## 2018-07-24 DIAGNOSIS — I69354 Hemiplegia and hemiparesis following cerebral infarction affecting left non-dominant side: Secondary | ICD-10-CM | POA: Diagnosis not present

## 2018-07-24 DIAGNOSIS — R0602 Shortness of breath: Secondary | ICD-10-CM | POA: Diagnosis not present

## 2018-07-24 DIAGNOSIS — M6281 Muscle weakness (generalized): Secondary | ICD-10-CM | POA: Diagnosis not present

## 2018-07-24 DIAGNOSIS — J181 Lobar pneumonia, unspecified organism: Secondary | ICD-10-CM | POA: Diagnosis not present

## 2018-07-24 DIAGNOSIS — I130 Hypertensive heart and chronic kidney disease with heart failure and stage 1 through stage 4 chronic kidney disease, or unspecified chronic kidney disease: Secondary | ICD-10-CM | POA: Diagnosis not present

## 2018-07-24 DIAGNOSIS — I4891 Unspecified atrial fibrillation: Secondary | ICD-10-CM | POA: Diagnosis not present

## 2018-07-24 DIAGNOSIS — M25561 Pain in right knee: Secondary | ICD-10-CM | POA: Diagnosis not present

## 2018-07-24 DIAGNOSIS — R1311 Dysphagia, oral phase: Secondary | ICD-10-CM | POA: Diagnosis not present

## 2018-07-24 LAB — BASIC METABOLIC PANEL
ANION GAP: 9 (ref 5–15)
BUN: 27 mg/dL — AB (ref 8–23)
CO2: 25 mmol/L (ref 22–32)
Calcium: 9 mg/dL (ref 8.9–10.3)
Chloride: 109 mmol/L (ref 98–111)
Creatinine, Ser: 1.51 mg/dL — ABNORMAL HIGH (ref 0.61–1.24)
GFR calc Af Amer: 43 mL/min — ABNORMAL LOW (ref >60)
GFR calc non Af Amer: 37 mL/min — ABNORMAL LOW (ref >60)
GLUCOSE: 110 mg/dL — AB (ref 70–99)
POTASSIUM: 3.7 mmol/L (ref 3.5–5.1)
Sodium: 143 mmol/L (ref 135–145)

## 2018-07-24 LAB — CULTURE, BLOOD (ROUTINE X 2)
CULTURE: NO GROWTH
Culture: NO GROWTH
SPECIAL REQUESTS: ADEQUATE
Special Requests: ADEQUATE

## 2018-07-24 MED ORDER — METHYLPREDNISOLONE ACETATE 40 MG/ML INJ SUSP (RADIOLOG
120.0000 mg | Freq: Once | INTRAMUSCULAR | Status: AC
  Administered 2018-07-24: 120 mg via INTRA_ARTICULAR

## 2018-07-24 MED ORDER — METHYLPREDNISOLONE ACETATE 80 MG/ML IJ SUSP
INTRAMUSCULAR | Status: AC
  Filled 2018-07-24: qty 1

## 2018-07-24 MED ORDER — BUPIVACAINE HCL (PF) 0.25 % IJ SOLN
10.0000 mL | Freq: Once | INTRAMUSCULAR | Status: AC
  Administered 2018-07-24: 10 mL via INTRA_ARTICULAR
  Filled 2018-07-24: qty 10

## 2018-07-24 MED ORDER — BUPIVACAINE HCL (PF) 0.25 % IJ SOLN
INTRAMUSCULAR | Status: AC
  Filled 2018-07-24: qty 30

## 2018-07-24 MED ORDER — IOPAMIDOL (ISOVUE-M 200) INJECTION 41%
INTRAMUSCULAR | Status: AC
  Filled 2018-07-24: qty 10

## 2018-07-24 MED ORDER — METHYLPREDNISOLONE ACETATE 40 MG/ML IJ SUSP
INTRAMUSCULAR | Status: AC
  Filled 2018-07-24: qty 1

## 2018-07-24 MED ORDER — SODIUM CHLORIDE 0.9 % IJ SOLN
INTRAMUSCULAR | Status: DC
Start: 2018-07-24 — End: 2018-07-25
  Filled 2018-07-24: qty 20

## 2018-07-24 MED ORDER — AMOXICILLIN-POT CLAVULANATE 500-125 MG PO TABS: 500.0000 mg | ORAL_TABLET | Freq: Two times a day (BID) | ORAL | 0 refills | Status: AC

## 2018-07-24 MED ORDER — CARVEDILOL 3.125 MG PO TABS: 3.1250 mg | ORAL_TABLET | Freq: Two times a day (BID) | ORAL | 0 refills | Status: DC

## 2018-07-24 MED ORDER — TAMSULOSIN HCL 0.4 MG PO CAPS: 0.4000 mg | ORAL_CAPSULE | Freq: Two times a day (BID) | ORAL | 0 refills | Status: AC

## 2018-07-24 MED ORDER — LIDOCAINE HCL (PF) 1 % IJ SOLN
INTRAMUSCULAR | Status: DC
Start: 2018-07-24 — End: 2018-07-25
  Filled 2018-07-24: qty 5

## 2018-07-24 MED ORDER — LIDOCAINE HCL (PF) 1 % IJ SOLN
5.0000 mL | Freq: Once | INTRAMUSCULAR | Status: AC
  Administered 2018-07-24: 5 mL via INTRADERMAL
  Filled 2018-07-24: qty 5

## 2018-07-24 MED ORDER — PREDNISONE 10 MG PO TABS: 10.0000 mg | ORAL_TABLET | Freq: Every day | ORAL | Status: DC

## 2018-07-24 MED ORDER — ALPRAZOLAM 0.5 MG PO TABS: ORAL_TABLET | ORAL | 1 refills | Status: AC

## 2018-07-24 NOTE — Progress Notes (Signed)
Patient will DC to: [facility] Anticipated DC date: 07/24/18 Family notified: Son and daughter in Systems [employer] by: Corey Harold (about an hour behind)   Per MD patient ready for DC to [facility]. RN, patient, patient's family, and facility notified of DC. Discharge Summary sent to facility. RN given number for report [phone] Room [phone]). DC packet on chart. Ambulance transport requested for patient.   CSW signing off.  Cedric Fishman, LCSW Clinical Social Worker [phone]

## 2018-07-24 NOTE — Progress Notes (Signed)
Pt resting in bed, eating a little applesauce. Pt not oriented. Will continue to monitor.

## 2018-07-24 NOTE — Progress Notes (Signed)
[facility] notified CSW that they have insurance pre-authorization for patient.  Percell Locus Laurie Lovejoy LCSW [phone]

## 2018-07-24 NOTE — Progress Notes (Signed)
Lab called concerning fluid brought for analysis. Said there was not enough fluid for cell count or diff, will only be able to get culture.

## 2018-07-24 NOTE — Discharge Summary (Signed)
Physician Discharge Summary  Micheal Nicholson LPF:[id number] DOB: 1920/04/21 DOA: 07/19/2018  PCP: [name], MD  Admit date: 07/19/2018 Discharge date: 07/24/2018  Admitted From: Home Disposition:  SNF  Recommendations for Outpatient Follow-up:  1. Follow up with PCP in 1-2 weeks 2. Recommend repeat BMET and CBC in one week  Discharge Condition:Stable CODE STATUS:DNR Diet recommendation: Dysphagia 2 with thin liquids   Brief/Interim Summary: Patient has Shortness of breath. URI symptom with productive cough,  Increased weakness and falls for 1 week.  he was seen by pmd on 7/18 then 7/26 for this, he is sent to ED for eval on 7/26 and got admitted for FTT and possible pna  CAP -cxr with "Patchy right basilar opacity could reflect superimposed" -Family reported patient has been having congested cough and right lower chest pain at home -No hypoxia, no fever, no leukocytosis -procalcitonin less than 0.1, urine strep antigen negative, sputum culture not collected, respiratory viral panel negtive, mrsa screening + -Patient initially noted to be lethargic, improved -stopped rocephin, change to augmentin x 2 more days treatment on discharge  Combined systolic and diastolic CHF -Last ef from 2017 35-40% -cxr "Cardiomegaly and mild interstitial edema" -no wheezing or rales on exam, no edema -home lasix held initially, resumed on 7/30  Afib with bradycardia s/p pacemaker ([employer] Jude single-chamber permanent pacemaker in January 2017 (Dr. Rayann Heman). CHADSVasc 7 (age 40, CVA 2, CAD, HTN, CHF) Rate controlled on coreg Concerns for increased fall risk, thus likely not candidate for therapeutic anticoagulation. 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   Urinary retention: Not a candidate for indwelling foley due to confusion Increased flomax from daily to bid,   HTN Stable on current regimen  CKDIII Stable at baseline Renal dosing meds  Delirium: Improved with scheduled low dose ativan PRN   Discharge Diagnoses:  Active Problems:   Metabolic encephalopathy    Discharge Instructions   Allergies as of 07/24/2018      Reactions   Chocolate Other (See Comments)   Acid reflux       Medication List    STOP taking these medications   apixaban 2.5 MG Tabs tablet Commonly known as:  ELIQUIS   ELIQUIS 5 MG Tabs tablet Generic drug:  apixaban   potassium chloride 10 MEQ tablet Commonly known as:  KLOR-CON M10   traMADol 50 MG tablet Commonly known as:  ULTRAM     TAKE these medications   acetaminophen 325 MG tablet Commonly known as:  TYLENOL Take 325 mg by mouth every 6 (six) hours as needed for mild pain or headache.   ALPRAZolam 0.5 MG tablet Commonly known as:  XANAX TAKE 1 TABLET BY MOUTH TWICE A DAY AS NEEDED FOR ANXIETY What changed:    how much to take  how to take this  when to take this  additional instructions   amoxicillin-clavulanate 500-125 MG tablet Commonly known as:  AUGMENTIN Take 1 tablet (500 mg total) by mouth 2 (two) times daily for 2 days.   bisacodyl 5 MG EC tablet Commonly known  as:  DULCOLAX Take 5 mg by mouth daily as needed for moderate constipation.   carvedilol 3.125 MG tablet Commonly known as:  COREG Take 1 tablet (3.125 mg total) by mouth 2 (two) times daily with a meal. What changed:    medication strength  how much to take  how to take this  when to take this  additional instructions    CORTIZONE-10 1 % ointment Generic drug:  hydrocortisone Apply 1 application topically 2 (two) times daily as needed (to affected areas for itching).   docusate sodium 100 MG capsule Commonly known as:  COLACE Take 100 mg by mouth 2 (two) times daily.   fluticasone 50 MCG/ACT nasal spray Commonly known as:  FLONASE Place 2 sprays into both nostrils 2 (two) times daily as needed for rhinitis.   furosemide 40 MG tablet Commonly known as:  LASIX PLEASE SEE ATTACHED FOR DETAILED DIRECTIONS What changed:  See the new instructions.   lisinopril 5 MG tablet Commonly known as:  PRINIVIL,ZESTRIL TAKE 1 TABLET DAILY   omeprazole 20 MG capsule Commonly known as:  PRILOSEC Take 1 capsule (20 mg total) by mouth daily as needed (for acid reflex).   pramipexole 0.125 MG tablet Commonly known as:  MIRAPEX TAKE 2 TABLETS BY MOUTH AT NIGHT FOR RESTLESS LEGS   predniSONE 10 MG tablet Commonly known as:  DELTASONE Take 1 tablet (10 mg total) by mouth daily with breakfast. Taper dose: 60mg  po daily x 2 days, then 40mg  po daily x 2 days, then 20mg  po daily x 2 days, then 10mg  po daily x 2 days, then 5mg  po daily x 2 days, then stop, zero refills   senna-docusate 8.6-50 MG tablet Commonly known as:  Senokot-S Take 1 tablet by mouth at bedtime as needed for mild constipation.   sertraline 100 MG tablet Commonly known as:  ZOLOFT TAKE 1/2 TABLET DAILY NEEDS OFFICE VISIT What changed:    how much to take  how to take this  when to take this  additional instructions   tamsulosin 0.4 MG Caps capsule Commonly known as:  FLOMAX Take 1 capsule (0.4 mg total) by mouth 2 (two) times daily. What changed:  when to take this   triamcinolone 0.1 % cream : eucerin Crea Apply 1 application topically 2 (two) times daily as needed for rash, itching or irritation.       Contact information for follow-up providers    Suella Broad, MD Follow up.   Specialty:  Physical Medicine and  Rehabilitation Why:  right knee pain, effusion  Contact information: [address] [place] [address] [phone]        [name], MD Follow up.   Specialty:  Family Medicine Why:  to discuss palliative care Contact information: [place] [address] [phone]            Contact information for after-discharge care    Destination    [facility] Preferred SNF .   Service:  Skilled Nursing Contact information: [place] [phone]                 Allergies  Allergen Reactions  . Chocolate Other (See Comments)    Acid reflux     Consultations:  Cardiology  Radiology for fluro guided arthrocentesis right knee  Procedures/Studies: Dg Chest 1 View  Result Date: 07/19/2018 CLINICAL DATA:  Shortness of breath. Increased weakness and falls for 1 week. Atrial fibrillation.  EXAM: CHEST  1 VIEW COMPARISON:  02/01/2017 FINDINGS: A single lead pacemaker remains in place. The cardiac silhouette remains moderately to prominently enlarged. There is aortic atherosclerosis. There is mild pulmonary vascular congestion and mild diffuse interstitial prominence, overall slightly less than on the prior study. Asymmetric patchy opacity is present in the right lung base. No sizable pleural effusion or pneumothorax is identified. Thoracolumbar scoliosis and old left rib fractures are noted. IMPRESSION: 1. Cardiomegaly and mild interstitial edema, slightly improved from the prior study. 2. Patchy right basilar opacity could reflect superimposed pneumonia. Electronically Signed   By: Logan Bores M.D.   On: 07/19/2018 14:17   Ct Head Wo Contrast  Result Date: 07/19/2018 CLINICAL DATA:  Pain after trauma. EXAM: CT HEAD WITHOUT CONTRAST CT CERVICAL SPINE WITHOUT CONTRAST TECHNIQUE: Multidetector CT imaging of the head and cervical spine was performed following the standard  protocol without intravenous contrast. Multiplanar CT image reconstructions of the cervical spine were also generated. COMPARISON:  February 01, 2017 FINDINGS: CT HEAD FINDINGS Brain: No subdural, epidural, or subarachnoid hemorrhage. The ventricles and sulci are stable and unremarkable. Low-attenuation in the left cerebellar hemisphere on series 4, image 6 is noted to be within a sulcus on coronal imaging. The cerebellum is unremarkable. Cerebellum is otherwise unremarkable. The brainstem and basal cisterns are normal. No mass effect or midline shift. There is an infarct involving the white matter of the right frontal parietal region and a small portion of overlying cortex, unchanged. Scattered white matter changes are identified. A lacunar infarct in the left external capsule on image 19 is stable. Vascular: No hyperdense vessel or unexpected calcification. Skull: Normal. Negative for fracture or focal lesion. Sinuses/Orbits: No acute finding. Other: None. CT CERVICAL SPINE FINDINGS Alignment: Anterolisthesis C7 versus T1 measuring 3 mm today is unchanged since February 2018. No other malalignment. Skull base and vertebrae: No fractures are seen Soft tissues and spinal canal: No prevertebral fluid or swelling. No visible canal hematoma. Disc levels:  Multilevel degenerative changes. Upper chest: Negative. Other: No other abnormalities. IMPRESSION: 1. No acute intracranial abnormality. 2. No fracture or acute traumatic malalignment in the cervical spine. Electronically Signed   By: Dorise Bullion III M.D   On: 07/19/2018 15:19   Ct Cervical Spine Wo Contrast  Result Date: 07/19/2018 CLINICAL DATA:  Pain after trauma. EXAM: CT HEAD WITHOUT CONTRAST CT CERVICAL SPINE WITHOUT CONTRAST TECHNIQUE: Multidetector CT imaging of the head and cervical spine was performed following the standard protocol without intravenous contrast. Multiplanar CT image reconstructions of the cervical spine were also generated.  COMPARISON:  February 01, 2017 FINDINGS: CT HEAD FINDINGS Brain: No subdural, epidural, or subarachnoid hemorrhage. The ventricles and sulci are stable and unremarkable. Low-attenuation in the left cerebellar hemisphere on series 4, image 6 is noted to be within a sulcus on coronal imaging. The cerebellum is unremarkable. Cerebellum is otherwise unremarkable. The brainstem and basal cisterns are normal. No mass effect or midline shift. There is an infarct involving the white matter of the right frontal parietal region and a small portion of overlying cortex, unchanged. Scattered white matter changes are identified. A lacunar infarct in the left external capsule on image 19 is stable. Vascular: No hyperdense vessel or unexpected calcification. Skull: Normal. Negative for fracture or focal lesion. Sinuses/Orbits: No acute finding. Other: None. CT CERVICAL SPINE FINDINGS Alignment: Anterolisthesis C7 versus T1 measuring 3 mm today is unchanged since February 2018. No other malalignment. Skull base and vertebrae: No fractures are seen Soft tissues  and spinal canal: No prevertebral fluid or swelling. No visible canal hematoma. Disc levels:  Multilevel degenerative changes. Upper chest: Negative. Other: No other abnormalities. IMPRESSION: 1. No acute intracranial abnormality. 2. No fracture or acute traumatic malalignment in the cervical spine. Electronically Signed   By: Dorise Bullion III M.D   On: 07/19/2018 15:19   Dg Knee Complete 4 Views Right  Result Date: 07/19/2018 CLINICAL DATA:  Leg pain after fall last night. EXAM: RIGHT KNEE - COMPLETE 4+ VIEW COMPARISON:  04/22/2007 FINDINGS: There is no fracture or dislocation. There is a prominent joint effusion in the suprapatellar recess. Calcific tendinopathy of the distal quadriceps tendon. Tiny marginal osteophyte on the upper pole the patella. Slight medial joint space narrowing. IMPRESSION: New joint effusion.  No appreciable acute bone abnormality.  Electronically Signed   By: Lorriane Shire M.D.   On: 07/19/2018 14:16   Dg Fluoro Guided Needle Plc Aspiration/injection Loc  Result Date: 07/24/2018 CLINICAL DATA:  Right knee pain. EXAM: Right knee INJECTION UNDER FLUOROSCOPY FLUOROSCOPY TIME:  Fluoroscopy Time:  0 minutes and 0 seconds Radiation Exposure Index (if provided by the fluoroscopic device): N/A Number of Acquired Spot Images: 0 PROCEDURE: An appropriate side for osseous centesis is marked along the medial aspect of the upper patella. This area was prepped and draped using usual sterile fashion with Betadine solution. The scan and subcutaneous tissues were infiltrated with 1% xylocaine for local anesthesia. The needle was advanced into the joint and approximately 1.5 cc of yellow, slightly viscous joint fluid was aspirated. 120mg  Depo-Medrol and 8 mlSensorcaine 0.25% were then administered. No immediate complication. IMPRESSION: Technically successful right knee injection. Electronically Signed   By: Marijo Sanes M.D.   On: 07/24/2018 13:59   Dg Hip Unilat W Or Wo Pelvis 2-3 Views Right  Result Date: 07/19/2018 CLINICAL DATA:  Right leg pain secondary to a fall at home last night. EXAM: DG HIP (WITH OR WITHOUT PELVIS) 2-3V RIGHT COMPARISON:  Radiographs dated 02/01/2017 FINDINGS: There is no evidence of hip fracture or dislocation. There is no evidence of arthropathy or other focal bone abnormality. Diffuse osteopenia. Minimal medial joint space narrowing, chronic. IMPRESSION: No acute abnormalities. Electronically Signed   By: Lorriane Shire M.D.   On: 07/19/2018 14:18     Subjective: Without complaints  Discharge Exam: Vitals:   07/23/18 2123 07/24/18 0502  BP: 122/73 131/74  Pulse: 95 61  Resp: 20 20  Temp: 98 F (36.7 C) 98.4 F (36.9 C)  SpO2: 93%    Vitals:   07/22/18 1333 07/22/18 2157 07/23/18 2123 07/24/18 0502  BP: 134/73 (!) 137/92 122/73 131/74  Pulse: 60 63 95 61  Resp: 20 (!) 22 20 20   Temp: 98.1 F (36.7  C) 98.2 F (36.8 C) 98 F (36.7 C) 98.4 F (36.9 C)  TempSrc: Oral Oral Oral Oral  SpO2: 94% 91% 93%   Weight:      Height:        General: Pt is alert, awake, not in acute distress Cardiovascular: RRR, S1/S2 +, no rubs, no gallops Respiratory: CTA bilaterally, no wheezing, no rhonchi Abdominal: Soft, NT, ND, bowel sounds + Extremities: no edema, no cyanosis   The results of significant diagnostics from this hospitalization (including imaging, microbiology, ancillary and laboratory) are listed below for reference.     Microbiology: Recent Results (from the past 240 hour(s))  Blood culture (routine x 2)     Status: None   Collection Time: 07/19/18  2:30 PM  Result Value Ref Range Status   Specimen Description BLOOD SITE NOT SPECIFIED  Final   Special Requests   Final    BOTTLES DRAWN AEROBIC AND ANAEROBIC Blood Culture adequate volume   Culture   Final    NO GROWTH 5 DAYS Performed at [facility], [address], [place], [place] [address]    Report Status 07/24/2018 FINAL  Final  Blood culture (routine x 2)     Status: None   Collection Time: 07/19/18  6:31 PM  Result Value Ref Range Status   Specimen Description BLOOD LEFT ANTECUBITAL  Final   Special Requests   Final    BOTTLES DRAWN AEROBIC AND ANAEROBIC Blood Culture adequate volume   Culture   Final    NO GROWTH 5 DAYS Performed at [facility], [place] [address] [place], [place] [address]    Report Status 07/24/2018 FINAL  Final  MRSA PCR Screening     Status: Abnormal   Collection Time: 07/20/18 12:28 PM  Result Value Ref Range Status   MRSA by PCR POSITIVE (A) NEGATIVE Final    Comment:        The GeneXpert MRSA Assay (FDA approved for NASAL specimens only), is one component of a comprehensive MRSA colonization surveillance program. It is not intended to diagnose MRSA infection nor to guide or monitor treatment for MRSA infections. RESULT CALLED TO, READ BACK BY AND VERIFIED WITH: [facility]  RN AT 1425 07/20/18 BY A.DAVIS Performed at [facility], [place] [address], [place], [place] [address]   Respiratory Panel by PCR     Status: None   Collection Time: 07/20/18 10:27 PM  Result Value Ref Range Status   Adenovirus NOT DETECTED NOT DETECTED Final   Coronavirus 229E NOT DETECTED NOT DETECTED Final   Coronavirus HKU1 NOT DETECTED NOT DETECTED Final   Coronavirus NL63 NOT DETECTED NOT DETECTED Final   Coronavirus OC43 NOT DETECTED NOT DETECTED Final   Metapneumovirus NOT DETECTED NOT DETECTED Final   Rhinovirus / Enterovirus NOT DETECTED NOT DETECTED Final   Influenza A NOT DETECTED NOT DETECTED Final   Influenza B NOT DETECTED NOT DETECTED Final   Parainfluenza Virus 1 NOT DETECTED NOT DETECTED Final   Parainfluenza Virus 2 NOT DETECTED NOT DETECTED Final   Parainfluenza Virus 3 NOT DETECTED NOT DETECTED Final   Parainfluenza Virus 4 NOT DETECTED NOT DETECTED Final   Respiratory Syncytial Virus NOT DETECTED NOT DETECTED Final   Bordetella pertussis NOT DETECTED NOT DETECTED Final   Chlamydophila pneumoniae NOT DETECTED NOT DETECTED Final   Mycoplasma pneumoniae NOT DETECTED NOT DETECTED Final    Comment: Performed at [facility], [place] [address] [place], [place] [address]  Urine culture     Status: None   Collection Time: 07/21/18  5:42 AM  Result Value Ref Range Status   Specimen Description URINE, RANDOM  Final   Special Requests NONE  Final   Culture   Final    NO GROWTH Performed at [facility], [place] [address] [place],  [address]    Report Status 07/22/2018 FINAL  Final  Body fluid culture     Status: None (Preliminary result)   Collection Time: 07/24/18  1:51 PM  Result Value Ref Range Status   Specimen Description SYNOVIAL RIGHT KNEE  Final   Special Requests NONE  Final   Gram Stain   Final    FEW WBC PRESENT, PREDOMINANTLY MONONUCLEAR NO ORGANISMS SEEN Performed at  [facility], [place] [address], [place], [place] [address]     Culture PENDING  Incomplete   Report Status PENDING  Incomplete     Labs: BNP (last 3 results) Recent Labs    07/19/18 1335 07/22/18 0612  BNP 748.8* 8,003.4*   Basic Metabolic Panel: Recent Labs  Lab 07/19/18 1335 07/19/18 1831 07/21/18 0715 07/22/18 0612 07/23/18 0628 07/24/18 0552  NA 140  --  139 142 140 143  K 4.3  --  4.4 4.2 4.2 3.7  CL 107  --  108 110 111 109  CO2 23  --  22 21* 21* 25  GLUCOSE 90  --  108* 115* 113* 110*  BUN 24*  --  25* 24* 26* 27*  CREATININE 1.50* 1.47* 1.55* 1.42* 1.40* 1.51*  CALCIUM 8.9  --  8.9 9.0 9.0 9.0   Liver Function Tests: Recent Labs  Lab 07/19/18 1335 07/21/18 0715 07/23/18 0628  AST 20 26 24   ALT 15 16 17   ALKPHOS 51 54 54  BILITOT 1.7* 2.5* 2.7*  PROT 6.4* 6.2* 6.2*  ALBUMIN 3.5 3.2* 3.3*   No results for input(s): LIPASE, AMYLASE in the last 168 hours. Recent Labs  Lab 07/23/18 0628  AMMONIA 22   CBC: Recent Labs  Lab 07/19/18 1335 07/19/18 1831 07/21/18 0715  WBC 4.0 3.3* 5.8  NEUTROABS 3.0  --  4.5  HGB 11.7* 11.2* 12.8*  HCT 37.5* 35.2* 39.1  MCV 91.7 91.4 88.7  PLT 183 170 214   Cardiac Enzymes: Recent Labs  Lab 07/19/18 1335  TROPONINI <0.03   BNP: Invalid input(s): POCBNP CBG: No results for input(s): GLUCAP in the last 168 hours. D-Dimer No results for input(s): DDIMER in the last 72 hours. Hgb A1c No results for input(s): HGBA1C in the last 72 hours. Lipid Profile No results for input(s): CHOL, HDL, LDLCALC, TRIG, CHOLHDL, LDLDIRECT in the last 72 hours. Thyroid function studies No results for input(s): TSH, T4TOTAL, T3FREE, THYROIDAB in the last 72 hours.  Invalid input(s): FREET3 Anemia work up No results for input(s): VITAMINB12, FOLATE, FERRITIN, TIBC, IRON, RETICCTPCT in the last 72 hours. Urinalysis    Component Value Date/Time   COLORURINE YELLOW 07/20/2018 1832   APPEARANCEUR CLEAR 07/20/2018 1832   LABSPEC 1.023 07/20/2018 1832   PHURINE 5.0 07/20/2018 1832    GLUCOSEU NEGATIVE 07/20/2018 1832   HGBUR NEGATIVE 07/20/2018 1832   BILIRUBINUR NEGATIVE 07/20/2018 1832   BILIRUBINUR negative 11/10/2013 1713   KETONESUR 5 (A) 07/20/2018 1832   PROTEINUR NEGATIVE 07/20/2018 1832   UROBILINOGEN 1.0 03/14/2015 1608   NITRITE NEGATIVE 07/20/2018 1832   LEUKOCYTESUR NEGATIVE 07/20/2018 1832   Sepsis Labs Invalid input(s): PROCALCITONIN,  WBC,  LACTICIDVEN Microbiology Recent Results (from the past 240 hour(s))  Blood culture (routine x 2)     Status: None   Collection Time: 07/19/18  2:30 PM  Result Value Ref Range Status   Specimen Description BLOOD SITE NOT SPECIFIED  Final   Special Requests   Final    BOTTLES DRAWN AEROBIC AND ANAEROBIC Blood Culture adequate volume   Culture   Final    NO GROWTH 5 DAYS Performed at [facility], [place] [address] [place], [place] [address]    Report Status 07/24/2018 FINAL  Final  Blood culture (routine x 2)     Status: None   Collection Time: 07/19/18  6:31 PM  Result Value Ref Range Status   Specimen Description BLOOD LEFT ANTECUBITAL  Final  Special Requests   Final    BOTTLES DRAWN AEROBIC AND ANAEROBIC Blood Culture adequate volume   Culture   Final    NO GROWTH 5 DAYS Performed at [facility], [place] [address], [place], [place] [address]    Report Status 07/24/2018 FINAL  Final  MRSA PCR Screening     Status: Abnormal   Collection Time: 07/20/18 12:28 PM  Result Value Ref Range Status   MRSA by PCR POSITIVE (A) NEGATIVE Final    Comment:        The GeneXpert MRSA Assay (FDA approved for NASAL specimens only), is one component of a comprehensive MRSA colonization surveillance program. It is not intended to diagnose MRSA infection nor to guide or monitor treatment for MRSA infections. RESULT CALLED TO, READ BACK BY AND VERIFIED WITH: [facility] RN AT 1425 07/20/18 BY A.DAVIS Performed at [facility], [place] [address], [place], [place] [address]   Respiratory Panel by PCR      Status: None   Collection Time: 07/20/18 10:27 PM  Result Value Ref Range Status   Adenovirus NOT DETECTED NOT DETECTED Final   Coronavirus 229E NOT DETECTED NOT DETECTED Final   Coronavirus HKU1 NOT DETECTED NOT DETECTED Final   Coronavirus NL63 NOT DETECTED NOT DETECTED Final   Coronavirus OC43 NOT DETECTED NOT DETECTED Final   Metapneumovirus NOT DETECTED NOT DETECTED Final   Rhinovirus / Enterovirus NOT DETECTED NOT DETECTED Final   Influenza A NOT DETECTED NOT DETECTED Final   Influenza B NOT DETECTED NOT DETECTED Final   Parainfluenza Virus 1 NOT DETECTED NOT DETECTED Final   Parainfluenza Virus 2 NOT DETECTED NOT DETECTED Final   Parainfluenza Virus 3 NOT DETECTED NOT DETECTED Final   Parainfluenza Virus 4 NOT DETECTED NOT DETECTED Final   Respiratory Syncytial Virus NOT DETECTED NOT DETECTED Final   Bordetella pertussis NOT DETECTED NOT DETECTED Final   Chlamydophila pneumoniae NOT DETECTED NOT DETECTED Final   Mycoplasma pneumoniae NOT DETECTED NOT DETECTED Final    Comment: Performed at [facility], [place] [address], [place], [place] [address]  Urine culture     Status: None   Collection Time: 07/21/18  5:42 AM  Result Value Ref Range Status   Specimen Description URINE, RANDOM  Final   Special Requests NONE  Final   Culture   Final    NO GROWTH Performed at [facility], [place] [address] [place], [place] [address]    Report Status 07/22/2018 FINAL  Final  Body fluid culture     Status: None (Preliminary result)   Collection Time: 07/24/18  1:51 PM  Result Value Ref Range Status   Specimen Description SYNOVIAL RIGHT KNEE  Final   Special Requests NONE  Final   Gram Stain   Final    FEW WBC PRESENT, PREDOMINANTLY MONONUCLEAR NO ORGANISMS SEEN Performed at [facility], [place] [address] [place], [place] [address]    Culture PENDING  Incomplete   Report Status PENDING  Incomplete   Time spent: 55min  SIGNED:   Marylu Lund, MD  [facility] 07/24/2018, 4:02 PM  If 7PM-7AM, please contact night-coverage [email or web address] Password [email or web address]

## 2018-07-24 NOTE — Progress Notes (Signed)
Report given to [facility]. Awaiting [facility] transport. IV out.

## 2018-07-24 NOTE — Clinical Social Work Placement (Signed)
   CLINICAL SOCIAL WORK PLACEMENT  NOTE  Date:  07/24/2018  Patient Details  Name: Micheal Nicholson MRN: [id number] Date of Birth: [birth year]  Clinical Social Work is seeking post-discharge placement for this patient at the [place] level of care (*CSW will initial, date and re-position this form in  chart as items are completed):  Yes   Patient/family provided with [facility] Work Department's list of facilities offering this level of care within the geographic area requested by the patient (or if unable, by the patient's family).  Yes   Patient/family informed of their freedom to choose among providers that offer the needed level of care, that participate in Medicare, Medicaid or managed care program needed by the patient, have an available bed and are willing to accept the patient.  Yes   Patient/family informed of [facility]'s ownership interest in [facility] and [facility], as well as of the fact that they are under no obligation to receive care at these facilities.  PASRR submitted to EDS on       PASRR number received on       Existing PASRR number confirmed on 07/24/18     FL2 transmitted to all facilities in geographic area requested by pt/family on 07/24/18     FL2 transmitted to all facilities within larger geographic area on       Patient informed that his/her managed care company has contracts with or will negotiate with certain facilities, including the following:        Yes   Patient/family informed of bed offers received.  Patient chooses bed at [facility]     Physician recommends and patient chooses bed at      Patient to be transferred to [facility] on 07/24/18.  Patient to be transferred to facility by [facility]     Patient family notified on 07/24/18 of transfer.  Name of family member notified:  Gene, son     PHYSICIAN       Additional Comment:     _______________________________________________ Benard Halsted, [place] 07/24/2018, 4:16 PM

## 2018-07-24 NOTE — Progress Notes (Signed)
Pt resting in bed, wakes to voice, somewhat drowsy. Not alert enough to take medications. Oral care given.   Radiology called and said pt ok to have breakfast but to hold lunch for knee aspiration around 1pm

## 2018-07-25 DIAGNOSIS — I5042 Chronic combined systolic (congestive) and diastolic (congestive) heart failure: Secondary | ICD-10-CM | POA: Diagnosis not present

## 2018-07-25 DIAGNOSIS — I482 Chronic atrial fibrillation: Secondary | ICD-10-CM | POA: Diagnosis not present

## 2018-07-25 DIAGNOSIS — M25561 Pain in right knee: Secondary | ICD-10-CM | POA: Diagnosis not present

## 2018-07-25 DIAGNOSIS — J189 Pneumonia, unspecified organism: Secondary | ICD-10-CM | POA: Diagnosis not present

## 2018-07-27 ENCOUNTER — Other Ambulatory Visit: Payer: Self-pay | Admitting: Family Medicine

## 2018-07-28 LAB — BODY FLUID CULTURE: Culture: NO GROWTH

## 2018-07-29 DIAGNOSIS — J189 Pneumonia, unspecified organism: Secondary | ICD-10-CM | POA: Diagnosis not present

## 2018-07-29 DIAGNOSIS — I5042 Chronic combined systolic (congestive) and diastolic (congestive) heart failure: Secondary | ICD-10-CM | POA: Diagnosis not present

## 2018-07-29 DIAGNOSIS — M25561 Pain in right knee: Secondary | ICD-10-CM | POA: Diagnosis not present

## 2018-07-29 DIAGNOSIS — R338 Other retention of urine: Secondary | ICD-10-CM | POA: Diagnosis not present

## 2018-08-01 DIAGNOSIS — J189 Pneumonia, unspecified organism: Secondary | ICD-10-CM | POA: Diagnosis not present

## 2018-08-01 DIAGNOSIS — R296 Repeated falls: Secondary | ICD-10-CM | POA: Diagnosis not present

## 2018-08-01 DIAGNOSIS — I5042 Chronic combined systolic (congestive) and diastolic (congestive) heart failure: Secondary | ICD-10-CM | POA: Diagnosis not present

## 2018-08-01 DIAGNOSIS — M25561 Pain in right knee: Secondary | ICD-10-CM | POA: Diagnosis not present

## 2018-08-02 DIAGNOSIS — R2689 Other abnormalities of gait and mobility: Secondary | ICD-10-CM | POA: Diagnosis not present

## 2018-08-02 DIAGNOSIS — R4182 Altered mental status, unspecified: Secondary | ICD-10-CM | POA: Diagnosis not present

## 2018-08-02 DIAGNOSIS — D696 Thrombocytopenia, unspecified: Secondary | ICD-10-CM | POA: Diagnosis not present

## 2018-08-02 DIAGNOSIS — J690 Pneumonitis due to inhalation of food and vomit: Secondary | ICD-10-CM | POA: Diagnosis not present

## 2018-08-02 DIAGNOSIS — I639 Cerebral infarction, unspecified: Secondary | ICD-10-CM | POA: Diagnosis not present

## 2018-08-02 DIAGNOSIS — I10 Essential (primary) hypertension: Secondary | ICD-10-CM | POA: Diagnosis not present

## 2018-08-02 DIAGNOSIS — I4891 Unspecified atrial fibrillation: Secondary | ICD-10-CM | POA: Diagnosis not present

## 2018-08-02 DIAGNOSIS — E46 Unspecified protein-calorie malnutrition: Secondary | ICD-10-CM | POA: Diagnosis not present

## 2018-08-02 DIAGNOSIS — I509 Heart failure, unspecified: Secondary | ICD-10-CM | POA: Diagnosis not present

## 2018-08-15 DIAGNOSIS — R296 Repeated falls: Secondary | ICD-10-CM | POA: Diagnosis not present

## 2018-08-15 DIAGNOSIS — I5042 Chronic combined systolic (congestive) and diastolic (congestive) heart failure: Secondary | ICD-10-CM | POA: Diagnosis not present

## 2018-08-15 DIAGNOSIS — I482 Chronic atrial fibrillation: Secondary | ICD-10-CM | POA: Diagnosis not present

## 2018-08-15 DIAGNOSIS — J189 Pneumonia, unspecified organism: Secondary | ICD-10-CM | POA: Diagnosis not present

## 2018-08-15 LAB — CUP PACEART INCLINIC DEVICE CHECK
Implantable Lead Implant Date: 20170119
Implantable Pulse Generator Implant Date: 20170119
MDC IDC LEAD LOCATION: 753860
MDC IDC SESS DTM: 20190822144821
Pulse Gen Serial Number: 7822433

## 2018-08-22 DIAGNOSIS — I5042 Chronic combined systolic (congestive) and diastolic (congestive) heart failure: Secondary | ICD-10-CM | POA: Diagnosis not present

## 2018-08-22 DIAGNOSIS — Z8673 Personal history of transient ischemic attack (TIA), and cerebral infarction without residual deficits: Secondary | ICD-10-CM | POA: Diagnosis not present

## 2018-08-22 DIAGNOSIS — F0150 Vascular dementia without behavioral disturbance: Secondary | ICD-10-CM | POA: Diagnosis not present

## 2018-08-22 DIAGNOSIS — I482 Chronic atrial fibrillation: Secondary | ICD-10-CM | POA: Diagnosis not present

## 2018-08-23 ENCOUNTER — Other Ambulatory Visit: Payer: Self-pay

## 2018-08-23 ENCOUNTER — Encounter (HOSPITAL_COMMUNITY): Payer: Self-pay | Admitting: Emergency Medicine

## 2018-08-23 ENCOUNTER — Emergency Department (HOSPITAL_COMMUNITY)
Admission: EM | Admit: 2018-08-23 | Discharge: 2018-08-24 | Disposition: A | Discharge status: Home / Self Care | Payer: Medicare HMO | Attending: Emergency Medicine | Admitting: Emergency Medicine

## 2018-08-23 DIAGNOSIS — R195 Other fecal abnormalities: Secondary | ICD-10-CM | POA: Diagnosis not present

## 2018-08-23 DIAGNOSIS — R1012 Left upper quadrant pain: Secondary | ICD-10-CM

## 2018-08-23 DIAGNOSIS — Z87891 Personal history of nicotine dependence: Secondary | ICD-10-CM | POA: Insufficient documentation

## 2018-08-23 DIAGNOSIS — I130 Hypertensive heart and chronic kidney disease with heart failure and stage 1 through stage 4 chronic kidney disease, or unspecified chronic kidney disease: Secondary | ICD-10-CM | POA: Diagnosis not present

## 2018-08-23 DIAGNOSIS — N183 Chronic kidney disease, stage 3 (moderate): Secondary | ICD-10-CM | POA: Insufficient documentation

## 2018-08-23 DIAGNOSIS — Z950 Presence of cardiac pacemaker: Secondary | ICD-10-CM | POA: Insufficient documentation

## 2018-08-23 DIAGNOSIS — R161 Splenomegaly, not elsewhere classified: Secondary | ICD-10-CM

## 2018-08-23 DIAGNOSIS — Z79899 Other long term (current) drug therapy: Secondary | ICD-10-CM | POA: Insufficient documentation

## 2018-08-23 DIAGNOSIS — I5043 Acute on chronic combined systolic (congestive) and diastolic (congestive) heart failure: Secondary | ICD-10-CM | POA: Diagnosis not present

## 2018-08-23 DIAGNOSIS — I2510 Atherosclerotic heart disease of native coronary artery without angina pectoris: Secondary | ICD-10-CM | POA: Diagnosis not present

## 2018-08-23 NOTE — ED Triage Notes (Signed)
Pt BIB EMS from [facility] LL[facility] pain began yesterday, radiating to L shoulder. Was at rehab facility for past two weeks for a fall. No new falls or other trauma. Denies CP, SOB, N/V/D. Facility staff gave pt 324mg  ASA prior to EMS arrival.

## 2018-08-23 NOTE — ED Provider Notes (Signed)
[facility] EMERGENCY DEPARTMENT Provider Note   CSN: [id number] Arrival date & time: 08/23/18  2341     History   Chief Complaint Chief Complaint  Patient presents with  . Abdominal Pain    HPI Micheal Nicholson is a 84 y.o. male.  Patient is a 84 year old male with past medical history of CHF, pacer, hypertension, and prior CVA.  He is brought from his extended care facility for evaluation of left-sided abdominal pain.  He tells me he has had this pain off and on "for years".  It seems to have worsened over the past day.  He denies any nausea, vomiting, or diarrhea.  He denies any fevers or chills.  The history is provided by the patient.  Abdominal Pain   This is a new problem. The current episode started yesterday. The problem occurs constantly. The problem has been rapidly worsening. The pain is associated with an unknown factor. The pain is located in the LLQ. The quality of the pain is cramping. The pain is moderate. Pertinent negatives include fever and constipation. Nothing aggravates the symptoms. Nothing relieves the symptoms.    Past Medical History:  Diagnosis Date  . Anxiety   . Arthritis   . BPH (benign prostatic hyperplasia)   . CHF (congestive heart failure) ([place])   . Chronic combined systolic and diastolic CHF, NYHA class 2 ([place]) 04/17/2015  . GERD (gastroesophageal reflux disease)   . Hypertension   . Stroke ([place])   . Symptomatic sinus bradycardia    PPM Dr. Rayann Heman, 01/13/16, STJ device    Patient Active Problem List   Diagnosis Date Noted  . Metabolic encephalopathy 40/07/6760  . At high risk for falls 03/26/2017  . Acute on chronic systolic CHF (congestive heart failure) ([place]) 10/08/2016  . Acute respiratory failure with hypoxia ([place]) 10/08/2016  . CKD (chronic kidney disease), stage III ([place]) 10/08/2016  . Sacral fracture, closed ([place]) 10/08/2016  . Hypoxia   . Hypokalemia 08/24/2016  . Community acquired pneumonia 08/23/2016   . Pruritus 08/09/2016  . Chronic anticoagulation 04/28/2016  . Cardiac pacemaker in situ 04/28/2016  . Chronic systolic congestive heart failure ([place]) 04/28/2016  . Left hemiparesis ([place]) 04/25/2016  . Encounter for therapeutic drug monitoring 04/19/2016  . Cardiomyopathy, ischemic   . Cerebral embolism with cerebral infarction 03/03/2016  . Normocytic anemia   . Thrombocytopenia ([place])   . Acute left hemiparesis ([place])   . Peripheral neuropathic pain   . Hemiplegia ([place]) 03/02/2016  . Adenopathy   . Long term current use of anticoagulant therapy 01/23/2016  . CAD (coronary artery disease) 01/23/2016  . Second degree Mobitz II AV block 01/13/2016  . Persistent atrial fibrillation ([place])   . Atrial fibrillation with slow ventricular response ([place])   . AKI (acute kidney injury) ([place])   . Bradycardia 12/29/2015  . Acute renal failure (ARF) ([place]) 12/29/2015  . Hyponatremia 12/29/2015  . Hypotension 12/29/2015  . Elevated troponin 12/29/2015  . Acute upper respiratory infection 12/23/2015  . RLS (restless legs syndrome) 09/09/2015  . Chronic combined systolic and diastolic CHF, NYHA class 2 ([place]) 04/17/2015  . Vitamin D deficiency 03/11/2015  . Cardiomyopathy -etiology uncertain 95/08/3266  . Paroxysmal atrial fibrillation ([place]) 08/27/2014  . 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 H/O: stroke 08/27/2014  . GERD (gastroesophageal reflux disease) 02/07/2011  . BPH (benign prostatic hypertrophy) 07/12/2010  . Anxiety state 11/02/2009  . Essential hypertension 11/02/2009    Past Surgical History:  Procedure Laterality Date  . CARDIAC  CATHETERIZATION  2006   Negative / 4 years ago  . EP IMPLANTABLE DEVICE N/A 01/13/2016   Procedure: Pacemaker Implant;  Surgeon: Thompson Grayer, MD;  Location: [facility];  Service: Cardiovascular;  Laterality: N/A;        Home Medications    Prior to Admission medications   Medication Sig Start Date End Date Taking? Authorizing Provider    acetaminophen (TYLENOL) 325 MG tablet Take 325 mg by mouth every 6 (six) hours as needed for mild pain or headache.    [provider]  ALPRAZolam Duanne Moron) 0.5 MG tablet TAKE 1 TABLET BY MOUTH TWICE A DAY AS NEEDED FOR ANXIETY 07/24/18   Donne Hazel, MD  bisacodyl (DULCOLAX) 5 MG EC tablet Take 5 mg by mouth daily as needed for moderate constipation.    [provider]  carvedilol (COREG) 3.125 MG tablet Take 1 tablet (3.125 mg total) by mouth 2 (two) times daily with a meal. 07/24/18 08/23/18  Donne Hazel, MD  docusate sodium (COLACE) 100 MG capsule Take 100 mg by mouth 2 (two) times daily.    [provider]  fluticasone (FLONASE) 50 MCG/ACT nasal spray Place 2 sprays into both nostrils 2 (two) times daily as needed for rhinitis.     [provider]  furosemide (LASIX) 40 MG tablet PLEASE SEE ATTACHED FOR DETAILED DIRECTIONS Patient taking differently: Take 40 mg by mouth in the morning 07/17/18   Croitoru, Mihai, MD  hydrocortisone (CORTIZONE-10) 1 % ointment Apply 1 application topically 2 (two) times daily as needed (to affected areas for itching).     [provider]  lisinopril (PRINIVIL,ZESTRIL) 5 MG tablet TAKE 1 TABLET DAILY 01/21/18   Burchette, Alinda Sierras, MD  omeprazole (PRILOSEC) 20 MG capsule Take 1 capsule (20 mg total) by mouth daily as needed (for acid reflex). 11/24/16   Burchette, Alinda Sierras, MD  pramipexole (MIRAPEX) 0.125 MG tablet TAKE 2 TABLETS BY MOUTH AT NIGHT FOR RESTLESS LEGS 07/29/18   Burchette, Alinda Sierras, MD  predniSONE (DELTASONE) 10 MG tablet Take 1 tablet (10 mg total) by mouth daily with breakfast. Taper dose: 60mg  po daily x 2 days, then 40mg  po daily x 2 days, then 20mg  po daily x 2 days, then 10mg  po daily x 2 days, then 5mg  po daily x 2 days, then stop, zero refills 07/24/18   Donne Hazel, MD  senna-docusate (SENOKOT-S) 8.6-50 MG tablet Take 1 tablet by mouth at bedtime as needed for mild constipation. 03/06/16   Dhungel,  Nishant, MD  sertraline (ZOLOFT) 100 MG tablet TAKE 1/2 TABLET DAILY NEEDS OFFICE VISIT Patient taking differently: Take 50 mg by mouth every morning.  05/29/18   Burchette, Alinda Sierras, MD  tamsulosin (FLOMAX) 0.4 MG CAPS capsule Take 1 capsule (0.4 mg total) by mouth 2 (two) times daily. 07/24/18 08/23/18  Donne Hazel, MD  Triamcinolone Acetonide (TRIAMCINOLONE 0.1 % CREAM : EUCERIN) CREA Apply 1 application topically 2 (two) times daily as needed for rash, itching or irritation.    [provider]    Family History Family History  Problem Relation Age of Onset  . Pneumonia Father 22       died  . Lung cancer Brother   . Heart disease Brother   . Pancreatic cancer Son   . Healthy Son   . Heart failure Son     Social History Social History   Tobacco Use  . Smoking status: Former Smoker    Packs/day: 0.50  Years: 5.00    Pack years: 2.50  . Smokeless tobacco: Never Used  Substance Use Topics  . Alcohol use: Yes    Alcohol/week: 1.0 standard drinks    Types: 1 Shots of liquor per week    Comment: occ  . Drug use: No     Allergies   Chocolate   Review of Systems Review of Systems  Constitutional: Negative for fever.  Gastrointestinal: Positive for abdominal pain. Negative for constipation.  All other systems reviewed and are negative.    Physical Exam Updated Vital Signs BP 113/64 (BP Location: Right Arm)   Pulse 69   Resp 19   Ht 5\' 6"  (1.676 m)   Wt 61.5 kg   SpO2 96%   BMI 21.89 kg/m   Physical Exam  Constitutional: He is oriented to person, place, and time. He appears well-developed and well-nourished. No distress.  HENT:  Head: Normocephalic and atraumatic.  Mouth/Throat: Oropharynx is clear and moist.  Neck: Normal range of motion. Neck supple.  Cardiovascular: Normal rate and regular rhythm. Exam reveals no friction rub.  No murmur heard. Pulmonary/Chest: Effort normal and breath sounds normal. No respiratory distress. He has no wheezes.  He has no rales.  Abdominal: Soft. Bowel sounds are normal. He exhibits no distension. There is tenderness in the left lower quadrant. There is no rigidity, no rebound and no guarding.  Musculoskeletal: Normal range of motion. He exhibits no edema.  Neurological: He is alert and oriented to person, place, and time. Coordination normal.  Skin: Skin is warm and dry. He is not diaphoretic.  Nursing note and vitals reviewed.    ED Treatments / Results  Labs (all labs ordered are listed, but only abnormal results are displayed) Labs Reviewed  LIPASE, BLOOD  COMPREHENSIVE METABOLIC PANEL  CBC  URINALYSIS, ROUTINE W REFLEX MICROSCOPIC    EKG None  Radiology No results found.  Procedures Procedures (including critical care time)  Medications Ordered in ED Medications - No data to display   Initial Impression / Assessment and Plan / ED Course  I have reviewed the triage vital signs and the nursing notes.  Pertinent labs & imaging results that were available during my care of the patient were reviewed by me and considered in my medical decision making (see chart for details).  Patient presents with left-sided abdominal pain.  He has mild tenderness in this area, however no white count and CT scan shows possible splenic masses.  Recommendation for ultrasound follow-up was followed and revealed no identifiable abnormality.  At this point, the patient appears very comfortable and I see no indication for further work-up.  He will be discharged, to follow-up as needed.  Final Clinical Impressions(s) / ED Diagnoses   Final diagnoses:  None    ED Discharge Orders    None       Veryl Speak, MD 08/24/18 [phone]

## 2018-08-24 ENCOUNTER — Emergency Department (HOSPITAL_COMMUNITY): Payer: Medicare HMO

## 2018-08-24 LAB — COMPREHENSIVE METABOLIC PANEL
ALK PHOS: 64 U/L (ref 38–126)
ALT: 10 U/L (ref 0–44)
ANION GAP: 9 (ref 5–15)
AST: 15 U/L (ref 15–41)
Albumin: 3.4 g/dL — ABNORMAL LOW (ref 3.5–5.0)
BILIRUBIN TOTAL: 1 mg/dL (ref 0.3–1.2)
BUN: 20 mg/dL (ref 8–23)
CALCIUM: 8.6 mg/dL — AB (ref 8.9–10.3)
CO2: 26 mmol/L (ref 22–32)
CREATININE: 1.58 mg/dL — AB (ref 0.61–1.24)
Chloride: 100 mmol/L (ref 98–111)
GFR, EST AFRICAN AMERICAN: 41 mL/min — AB (ref >60)
GFR, EST NON AFRICAN AMERICAN: 35 mL/min — AB (ref >60)
Glucose, Bld: 108 mg/dL — ABNORMAL HIGH (ref 70–99)
Potassium: 4.5 mmol/L (ref 3.5–5.1)
Sodium: 135 mmol/L (ref 135–145)
TOTAL PROTEIN: 5.7 g/dL — AB (ref 6.5–8.1)

## 2018-08-24 LAB — CBC
HCT: 34.3 % — ABNORMAL LOW (ref 39.0–52.0)
Hemoglobin: 11 g/dL — ABNORMAL LOW (ref 13.0–17.0)
MCH: 29.3 pg (ref 26.0–34.0)
MCHC: 32.1 g/dL (ref 30.0–36.0)
MCV: 91.5 fL (ref 78.0–100.0)
PLATELETS: 138 10*3/uL — AB (ref 150–400)
RBC: 3.75 MIL/uL — AB (ref 4.22–5.81)
RDW: 15 % (ref 11.5–15.5)
WBC: 3.7 10*3/uL — AB (ref 4.0–10.5)

## 2018-08-24 LAB — LIPASE, BLOOD: Lipase: 38 U/L (ref 11–51)

## 2018-08-24 MED ORDER — IOPAMIDOL (ISOVUE-300) INJECTION 61%
INTRAVENOUS | Status: AC
  Filled 2018-08-24: qty 100

## 2018-08-24 MED ORDER — HYDROCODONE-ACETAMINOPHEN 5-325 MG PO TABS
1.0000 | ORAL_TABLET | Freq: Once | ORAL | Status: AC
  Administered 2018-08-24: 1 via ORAL
  Filled 2018-08-24: qty 1

## 2018-08-24 MED ORDER — HYDROCODONE-ACETAMINOPHEN 5-325 MG PO TABS: 1.0000 | ORAL_TABLET | Freq: Four times a day (QID) | ORAL | 0 refills | Status: DC | PRN

## 2018-08-24 MED ORDER — IOPAMIDOL (ISOVUE-370) INJECTION 76%
100.0000 mL | Freq: Once | INTRAVENOUS | Status: AC | PRN
  Administered 2018-08-24: 100 mL via INTRAVENOUS

## 2018-08-24 NOTE — ED Notes (Signed)
Patient transported to CT 

## 2018-08-24 NOTE — Discharge Instructions (Addendum)
Hydrocodone as prescribed as needed for pain.  Follow up with your primary doctor if not improving in the next week, and return to the ER if symptoms significantly worsen or change.

## 2018-08-24 NOTE — ED Notes (Signed)
Patient transported to Ultrasound 

## 2018-08-24 NOTE — ED Notes (Signed)
Made two attempts to contact [facility] for handoff report. No answer or voicemail at listed number on facility's website.

## 2018-08-24 NOTE — ED Notes (Signed)
[facility] called for transport.  

## 2018-08-24 NOTE — ED Notes (Signed)
Made third attempt to call report to [facility] with no answer at listed number. Report given to [facility] crew. Pt departed in NAD.

## 2018-08-29 DIAGNOSIS — M5416 Radiculopathy, lumbar region: Secondary | ICD-10-CM | POA: Diagnosis not present

## 2018-08-30 ENCOUNTER — Telehealth: Payer: Self-pay | Admitting: Family Medicine

## 2018-08-30 ENCOUNTER — Other Ambulatory Visit: Payer: Self-pay | Admitting: Family Medicine

## 2018-08-30 NOTE — Telephone Encounter (Signed)
Copied from [place] [phone]. Topic: Quick Communication - See Telephone Encounter >> Aug 30, 2018  3:33 PM Synthia Innocent wrote: CRM for notification. See Telephone encounter for: 08/30/18. [facility] will go out to the home on 09/02/18 for eval

## 2018-08-30 NOTE — Telephone Encounter (Signed)
FYI

## 2018-09-03 DIAGNOSIS — I130 Hypertensive heart and chronic kidney disease with heart failure and stage 1 through stage 4 chronic kidney disease, or unspecified chronic kidney disease: Secondary | ICD-10-CM | POA: Diagnosis not present

## 2018-09-03 DIAGNOSIS — I5043 Acute on chronic combined systolic (congestive) and diastolic (congestive) heart failure: Secondary | ICD-10-CM | POA: Diagnosis not present

## 2018-09-03 DIAGNOSIS — N183 Chronic kidney disease, stage 3 (moderate): Secondary | ICD-10-CM | POA: Diagnosis not present

## 2018-09-03 DIAGNOSIS — I69354 Hemiplegia and hemiparesis following cerebral infarction affecting left non-dominant side: Secondary | ICD-10-CM | POA: Diagnosis not present

## 2018-09-03 DIAGNOSIS — I69351 Hemiplegia and hemiparesis following cerebral infarction affecting right dominant side: Secondary | ICD-10-CM | POA: Diagnosis not present

## 2018-09-03 DIAGNOSIS — I69318 Other symptoms and signs involving cognitive functions following cerebral infarction: Secondary | ICD-10-CM | POA: Diagnosis not present

## 2018-09-03 DIAGNOSIS — F0150 Vascular dementia without behavioral disturbance: Secondary | ICD-10-CM | POA: Diagnosis not present

## 2018-09-03 DIAGNOSIS — D631 Anemia in chronic kidney disease: Secondary | ICD-10-CM | POA: Diagnosis not present

## 2018-09-03 DIAGNOSIS — I480 Paroxysmal atrial fibrillation: Secondary | ICD-10-CM | POA: Diagnosis not present

## 2018-09-05 ENCOUNTER — Telehealth: Payer: Self-pay | Admitting: Family Medicine

## 2018-09-05 DIAGNOSIS — I69318 Other symptoms and signs involving cognitive functions following cerebral infarction: Secondary | ICD-10-CM | POA: Diagnosis not present

## 2018-09-05 DIAGNOSIS — I130 Hypertensive heart and chronic kidney disease with heart failure and stage 1 through stage 4 chronic kidney disease, or unspecified chronic kidney disease: Secondary | ICD-10-CM | POA: Diagnosis not present

## 2018-09-05 DIAGNOSIS — I69351 Hemiplegia and hemiparesis following cerebral infarction affecting right dominant side: Secondary | ICD-10-CM | POA: Diagnosis not present

## 2018-09-05 DIAGNOSIS — N183 Chronic kidney disease, stage 3 (moderate): Secondary | ICD-10-CM | POA: Diagnosis not present

## 2018-09-05 DIAGNOSIS — I5043 Acute on chronic combined systolic (congestive) and diastolic (congestive) heart failure: Secondary | ICD-10-CM | POA: Diagnosis not present

## 2018-09-05 DIAGNOSIS — F0150 Vascular dementia without behavioral disturbance: Secondary | ICD-10-CM | POA: Diagnosis not present

## 2018-09-05 DIAGNOSIS — I69354 Hemiplegia and hemiparesis following cerebral infarction affecting left non-dominant side: Secondary | ICD-10-CM | POA: Diagnosis not present

## 2018-09-05 DIAGNOSIS — D631 Anemia in chronic kidney disease: Secondary | ICD-10-CM | POA: Diagnosis not present

## 2018-09-05 DIAGNOSIS — I480 Paroxysmal atrial fibrillation: Secondary | ICD-10-CM | POA: Diagnosis not present

## 2018-09-05 NOTE — Telephone Encounter (Signed)
Copied from [place] [phone]. Topic: Quick Communication - See Telephone Encounter >> Sep 05, 2018  4:36 PM Reyne Dumas L wrote: CRM for notification. See Telephone encounter for: 09/05/18.  Malorie from [facility] calling to get verbal orders for [place] Once a week for 1 week Twice a week for 4 weeks Malorie can be reached at [phone], OK to leave a message.

## 2018-09-06 ENCOUNTER — Telehealth: Payer: Self-pay | Admitting: Family Medicine

## 2018-09-06 NOTE — Telephone Encounter (Signed)
I called Malorie and informed her Dr Elease Hashimoto approved the orders below.

## 2018-09-06 NOTE — Telephone Encounter (Signed)
Copied from [place]. Topic: Quick Communication - See Telephone Encounter >> Sep 06, 2018 10:06 AM Ahmed Prima L wrote: CRM for notification. See Telephone encounter for: 09/06/18.  Levada Dy, RN with [facility] called to get verbal orders to see the patient for skilled nursing. She would like to see him with a starting date of 9/10. She had planned to go see him on 9/9, but he did not have his medications in the home yet. She would like to go out once a week for 4 weeks for medical and medication management, home transition and safety. She can be reached at [phone] and if she does not answer you can leave a detailed message.

## 2018-09-06 NOTE — Telephone Encounter (Signed)
OK 

## 2018-09-09 ENCOUNTER — Telehealth: Payer: Self-pay | Admitting: Family Medicine

## 2018-09-09 NOTE — Telephone Encounter (Signed)
I left a detailed message on Angela's voicemail with the verbal orders that were approved by Dr Elease Hashimoto.

## 2018-09-09 NOTE — Telephone Encounter (Signed)
Last OV 07/19/18, No future OV  Please advise.

## 2018-09-09 NOTE — Telephone Encounter (Signed)
OK to proceed as requested.

## 2018-09-09 NOTE — Telephone Encounter (Signed)
Called Micheal Nicholson and left a voice message to give her the OK for the PT verbal orders requested.

## 2018-09-09 NOTE — Telephone Encounter (Signed)
OK 

## 2018-09-09 NOTE — Telephone Encounter (Signed)
Copied from [place] [phone]. Topic: Quick Communication - See Telephone Encounter >> Sep 09, 2018 11:22 AM Ivar Drape wrote: CRM for notification. See Telephone encounter for: 09/09/18. [facility]w [facility] [phone] needs verbal orders for Physical Therapy for the patient for balance, strengthening for 2 times a week for 8 weeks.

## 2018-09-10 DIAGNOSIS — N183 Chronic kidney disease, stage 3 (moderate): Secondary | ICD-10-CM | POA: Diagnosis not present

## 2018-09-10 DIAGNOSIS — I5043 Acute on chronic combined systolic (congestive) and diastolic (congestive) heart failure: Secondary | ICD-10-CM | POA: Diagnosis not present

## 2018-09-10 DIAGNOSIS — D631 Anemia in chronic kidney disease: Secondary | ICD-10-CM | POA: Diagnosis not present

## 2018-09-10 DIAGNOSIS — I480 Paroxysmal atrial fibrillation: Secondary | ICD-10-CM | POA: Diagnosis not present

## 2018-09-10 DIAGNOSIS — F0150 Vascular dementia without behavioral disturbance: Secondary | ICD-10-CM | POA: Diagnosis not present

## 2018-09-10 DIAGNOSIS — I69354 Hemiplegia and hemiparesis following cerebral infarction affecting left non-dominant side: Secondary | ICD-10-CM | POA: Diagnosis not present

## 2018-09-10 DIAGNOSIS — I130 Hypertensive heart and chronic kidney disease with heart failure and stage 1 through stage 4 chronic kidney disease, or unspecified chronic kidney disease: Secondary | ICD-10-CM | POA: Diagnosis not present

## 2018-09-10 DIAGNOSIS — I69318 Other symptoms and signs involving cognitive functions following cerebral infarction: Secondary | ICD-10-CM | POA: Diagnosis not present

## 2018-09-10 DIAGNOSIS — I69351 Hemiplegia and hemiparesis following cerebral infarction affecting right dominant side: Secondary | ICD-10-CM | POA: Diagnosis not present

## 2018-09-11 DIAGNOSIS — I69354 Hemiplegia and hemiparesis following cerebral infarction affecting left non-dominant side: Secondary | ICD-10-CM | POA: Diagnosis not present

## 2018-09-11 DIAGNOSIS — F0150 Vascular dementia without behavioral disturbance: Secondary | ICD-10-CM | POA: Diagnosis not present

## 2018-09-11 DIAGNOSIS — I69318 Other symptoms and signs involving cognitive functions following cerebral infarction: Secondary | ICD-10-CM | POA: Diagnosis not present

## 2018-09-11 DIAGNOSIS — I480 Paroxysmal atrial fibrillation: Secondary | ICD-10-CM | POA: Diagnosis not present

## 2018-09-11 DIAGNOSIS — I130 Hypertensive heart and chronic kidney disease with heart failure and stage 1 through stage 4 chronic kidney disease, or unspecified chronic kidney disease: Secondary | ICD-10-CM | POA: Diagnosis not present

## 2018-09-11 DIAGNOSIS — D631 Anemia in chronic kidney disease: Secondary | ICD-10-CM | POA: Diagnosis not present

## 2018-09-11 DIAGNOSIS — I5043 Acute on chronic combined systolic (congestive) and diastolic (congestive) heart failure: Secondary | ICD-10-CM | POA: Diagnosis not present

## 2018-09-11 DIAGNOSIS — I69351 Hemiplegia and hemiparesis following cerebral infarction affecting right dominant side: Secondary | ICD-10-CM | POA: Diagnosis not present

## 2018-09-11 DIAGNOSIS — N183 Chronic kidney disease, stage 3 (moderate): Secondary | ICD-10-CM | POA: Diagnosis not present

## 2018-09-12 DIAGNOSIS — I130 Hypertensive heart and chronic kidney disease with heart failure and stage 1 through stage 4 chronic kidney disease, or unspecified chronic kidney disease: Secondary | ICD-10-CM | POA: Diagnosis not present

## 2018-09-12 DIAGNOSIS — I480 Paroxysmal atrial fibrillation: Secondary | ICD-10-CM | POA: Diagnosis not present

## 2018-09-12 DIAGNOSIS — I69318 Other symptoms and signs involving cognitive functions following cerebral infarction: Secondary | ICD-10-CM | POA: Diagnosis not present

## 2018-09-12 DIAGNOSIS — I69354 Hemiplegia and hemiparesis following cerebral infarction affecting left non-dominant side: Secondary | ICD-10-CM | POA: Diagnosis not present

## 2018-09-12 DIAGNOSIS — N183 Chronic kidney disease, stage 3 (moderate): Secondary | ICD-10-CM | POA: Diagnosis not present

## 2018-09-12 DIAGNOSIS — I69351 Hemiplegia and hemiparesis following cerebral infarction affecting right dominant side: Secondary | ICD-10-CM | POA: Diagnosis not present

## 2018-09-12 DIAGNOSIS — D631 Anemia in chronic kidney disease: Secondary | ICD-10-CM | POA: Diagnosis not present

## 2018-09-12 DIAGNOSIS — I5043 Acute on chronic combined systolic (congestive) and diastolic (congestive) heart failure: Secondary | ICD-10-CM | POA: Diagnosis not present

## 2018-09-12 DIAGNOSIS — F0150 Vascular dementia without behavioral disturbance: Secondary | ICD-10-CM | POA: Diagnosis not present

## 2018-09-14 ENCOUNTER — Inpatient Hospital Stay (HOSPITAL_COMMUNITY)
Admission: EM | Admit: 2018-09-14 | Discharge: 2018-09-16 | DRG: 291 | Disposition: A | Discharge status: Home Health | Payer: Medicare HMO | Attending: Family Medicine | Admitting: Family Medicine

## 2018-09-14 ENCOUNTER — Encounter (HOSPITAL_COMMUNITY): Payer: Self-pay | Admitting: Oncology

## 2018-09-14 ENCOUNTER — Emergency Department (HOSPITAL_COMMUNITY): Payer: Medicare HMO

## 2018-09-14 ENCOUNTER — Other Ambulatory Visit: Payer: Self-pay

## 2018-09-14 DIAGNOSIS — N183 Chronic kidney disease, stage 3 unspecified: Secondary | ICD-10-CM | POA: Diagnosis present

## 2018-09-14 DIAGNOSIS — Z79899 Other long term (current) drug therapy: Secondary | ICD-10-CM

## 2018-09-14 DIAGNOSIS — Z8249 Family history of ischemic heart disease and other diseases of the circulatory system: Secondary | ICD-10-CM

## 2018-09-14 DIAGNOSIS — D638 Anemia in other chronic diseases classified elsewhere: Secondary | ICD-10-CM | POA: Diagnosis present

## 2018-09-14 DIAGNOSIS — I2510 Atherosclerotic heart disease of native coronary artery without angina pectoris: Secondary | ICD-10-CM | POA: Diagnosis not present

## 2018-09-14 DIAGNOSIS — I5043 Acute on chronic combined systolic (congestive) and diastolic (congestive) heart failure: Secondary | ICD-10-CM | POA: Diagnosis present

## 2018-09-14 DIAGNOSIS — K219 Gastro-esophageal reflux disease without esophagitis: Secondary | ICD-10-CM | POA: Diagnosis not present

## 2018-09-14 DIAGNOSIS — F419 Anxiety disorder, unspecified: Secondary | ICD-10-CM | POA: Diagnosis not present

## 2018-09-14 DIAGNOSIS — F411 Generalized anxiety disorder: Secondary | ICD-10-CM | POA: Diagnosis present

## 2018-09-14 DIAGNOSIS — N400 Enlarged prostate without lower urinary tract symptoms: Secondary | ICD-10-CM | POA: Diagnosis present

## 2018-09-14 DIAGNOSIS — R079 Chest pain, unspecified: Secondary | ICD-10-CM | POA: Diagnosis present

## 2018-09-14 DIAGNOSIS — Z8673 Personal history of transient ischemic attack (TIA), and cerebral infarction without residual deficits: Secondary | ICD-10-CM

## 2018-09-14 DIAGNOSIS — I429 Cardiomyopathy, unspecified: Secondary | ICD-10-CM

## 2018-09-14 DIAGNOSIS — R0902 Hypoxemia: Secondary | ICD-10-CM | POA: Diagnosis not present

## 2018-09-14 DIAGNOSIS — Z950 Presence of cardiac pacemaker: Secondary | ICD-10-CM | POA: Diagnosis not present

## 2018-09-14 DIAGNOSIS — Z23 Encounter for immunization: Secondary | ICD-10-CM | POA: Diagnosis not present

## 2018-09-14 DIAGNOSIS — J90 Pleural effusion, not elsewhere classified: Secondary | ICD-10-CM | POA: Diagnosis not present

## 2018-09-14 DIAGNOSIS — I340 Nonrheumatic mitral (valve) insufficiency: Secondary | ICD-10-CM | POA: Diagnosis not present

## 2018-09-14 DIAGNOSIS — I959 Hypotension, unspecified: Secondary | ICD-10-CM | POA: Diagnosis not present

## 2018-09-14 DIAGNOSIS — I4891 Unspecified atrial fibrillation: Secondary | ICD-10-CM | POA: Diagnosis not present

## 2018-09-14 DIAGNOSIS — Z87891 Personal history of nicotine dependence: Secondary | ICD-10-CM | POA: Diagnosis not present

## 2018-09-14 DIAGNOSIS — I10 Essential (primary) hypertension: Secondary | ICD-10-CM | POA: Diagnosis present

## 2018-09-14 DIAGNOSIS — I110 Hypertensive heart disease with heart failure: Secondary | ICD-10-CM | POA: Diagnosis not present

## 2018-09-14 DIAGNOSIS — G8194 Hemiplegia, unspecified affecting left nondominant side: Secondary | ICD-10-CM | POA: Diagnosis present

## 2018-09-14 DIAGNOSIS — I130 Hypertensive heart and chronic kidney disease with heart failure and stage 1 through stage 4 chronic kidney disease, or unspecified chronic kidney disease: Secondary | ICD-10-CM | POA: Diagnosis not present

## 2018-09-14 DIAGNOSIS — I361 Nonrheumatic tricuspid (valve) insufficiency: Secondary | ICD-10-CM | POA: Diagnosis not present

## 2018-09-14 DIAGNOSIS — Z91018 Allergy to other foods: Secondary | ICD-10-CM

## 2018-09-14 DIAGNOSIS — I69354 Hemiplegia and hemiparesis following cerebral infarction affecting left non-dominant side: Secondary | ICD-10-CM | POA: Diagnosis not present

## 2018-09-14 DIAGNOSIS — I5042 Chronic combined systolic (congestive) and diastolic (congestive) heart failure: Secondary | ICD-10-CM | POA: Diagnosis present

## 2018-09-14 DIAGNOSIS — R0602 Shortness of breath: Secondary | ICD-10-CM | POA: Diagnosis present

## 2018-09-14 DIAGNOSIS — I351 Nonrheumatic aortic (valve) insufficiency: Secondary | ICD-10-CM | POA: Diagnosis not present

## 2018-09-14 LAB — CALCIUM: Calcium: 8.5 mg/dL — ABNORMAL LOW (ref 8.9–10.3)

## 2018-09-14 LAB — CREATININE, SERUM
Creatinine, Ser: 1.54 mg/dL — ABNORMAL HIGH (ref 0.61–1.24)
GFR calc Af Amer: 42 mL/min — ABNORMAL LOW (ref >60)
GFR, EST NON AFRICAN AMERICAN: 36 mL/min — AB (ref >60)

## 2018-09-14 LAB — TROPONIN I
Troponin I: <0.03 ng/mL (ref <0.03)
Troponin I: <0.03 ng/mL (ref <0.03)

## 2018-09-14 LAB — CBC WITH DIFFERENTIAL/PLATELET
Abs Immature Granulocytes: 0 10*3/uL (ref 0.0–0.1)
BASOS PCT: 1 %
Basophils Absolute: 0 10*3/uL (ref 0.0–0.1)
EOS ABS: 0.2 10*3/uL (ref 0.0–0.7)
EOS PCT: 5 %
HCT: 32.9 % — ABNORMAL LOW (ref 39.0–52.0)
Hemoglobin: 10.3 g/dL — ABNORMAL LOW (ref 13.0–17.0)
IMMATURE GRANULOCYTES: 0 %
Lymphocytes Relative: 15 %
Lymphs Abs: 0.6 10*3/uL — ABNORMAL LOW (ref 0.7–4.0)
MCH: 28.9 pg (ref 26.0–34.0)
MCHC: 31.3 g/dL (ref 30.0–36.0)
MCV: 92.4 fL (ref 78.0–100.0)
MONO ABS: 0.3 10*3/uL (ref 0.1–1.0)
MONOS PCT: 8 %
NEUTROS PCT: 71 %
Neutro Abs: 2.7 10*3/uL (ref 1.7–7.7)
PLATELETS: 200 10*3/uL (ref 150–400)
RBC: 3.56 MIL/uL — ABNORMAL LOW (ref 4.22–5.81)
RDW: 15.4 % (ref 11.5–15.5)
WBC: 3.9 10*3/uL — ABNORMAL LOW (ref 4.0–10.5)

## 2018-09-14 LAB — MRSA PCR SCREENING: MRSA BY PCR: POSITIVE — AB

## 2018-09-14 LAB — BASIC METABOLIC PANEL
Anion gap: 9 (ref 5–15)
BUN: 23 mg/dL (ref 8–23)
CALCIUM: 8.5 mg/dL — AB (ref 8.9–10.3)
CO2: 22 mmol/L (ref 22–32)
CREATININE: 1.59 mg/dL — AB (ref 0.61–1.24)
Chloride: 108 mmol/L (ref 98–111)
GFR calc Af Amer: 41 mL/min — ABNORMAL LOW (ref >60)
GFR calc non Af Amer: 35 mL/min — ABNORMAL LOW (ref >60)
GLUCOSE: 98 mg/dL (ref 70–99)
Potassium: 4.4 mmol/L (ref 3.5–5.1)
Sodium: 139 mmol/L (ref 135–145)

## 2018-09-14 LAB — MAGNESIUM: MAGNESIUM: 2.3 mg/dL (ref 1.7–2.4)

## 2018-09-14 LAB — HEMOGLOBIN A1C
Hgb A1c MFr Bld: 5.8 % — ABNORMAL HIGH (ref 4.8–5.6)
Mean Plasma Glucose: 119.76 mg/dL

## 2018-09-14 LAB — I-STAT TROPONIN, ED: Troponin i, poc: 0.01 ng/mL (ref 0.00–0.08)

## 2018-09-14 LAB — URINALYSIS, ROUTINE W REFLEX MICROSCOPIC
BILIRUBIN URINE: NEGATIVE
Glucose, UA: NEGATIVE mg/dL
Hgb urine dipstick: NEGATIVE
Ketones, ur: NEGATIVE mg/dL
LEUKOCYTES UA: NEGATIVE
NITRITE: NEGATIVE
Protein, ur: NEGATIVE mg/dL
SPECIFIC GRAVITY, URINE: 1.005 (ref 1.005–1.030)
pH: 7 (ref 5.0–8.0)

## 2018-09-14 LAB — BRAIN NATRIURETIC PEPTIDE: B Natriuretic Peptide: 1048.1 pg/mL — ABNORMAL HIGH (ref 0.0–100.0)

## 2018-09-14 LAB — PHOSPHORUS: PHOSPHORUS: 3.7 mg/dL (ref 2.5–4.6)

## 2018-09-14 MED ORDER — FUROSEMIDE 10 MG/ML IJ SOLN
40.0000 mg | Freq: Two times a day (BID) | INTRAMUSCULAR | Status: DC
  Administered 2018-09-14 – 2018-09-15 (×4): 40 mg via INTRAVENOUS
  Filled 2018-09-14 (×4): qty 4

## 2018-09-14 MED ORDER — ONDANSETRON HCL 4 MG PO TABS: 4.0000 mg | ORAL_TABLET | Freq: Four times a day (QID) | ORAL | Status: DC | PRN

## 2018-09-14 MED ORDER — ALBUTEROL SULFATE (2.5 MG/3ML) 0.083% IN NEBU: 2.5000 mg | INHALATION_SOLUTION | RESPIRATORY_TRACT | Status: DC | PRN

## 2018-09-14 MED ORDER — HEPARIN SODIUM (PORCINE) 5000 UNIT/ML IJ SOLN
5000.0000 [IU] | Freq: Three times a day (TID) | INTRAMUSCULAR | Status: DC
  Administered 2018-09-14 – 2018-09-16 (×6): 5000 [IU] via SUBCUTANEOUS
  Filled 2018-09-14 (×4): qty 1

## 2018-09-14 MED ORDER — HYDROCODONE-ACETAMINOPHEN 5-325 MG PO TABS
1.0000 | ORAL_TABLET | ORAL | Status: DC | PRN
  Administered 2018-09-14 – 2018-09-16 (×4): 1 via ORAL
  Filled 2018-09-14 (×4): qty 1

## 2018-09-14 MED ORDER — PREDNISONE 20 MG PO TABS: 10.0000 mg | ORAL_TABLET | Freq: Every day | ORAL | Status: DC

## 2018-09-14 MED ORDER — PRAMIPEXOLE DIHYDROCHLORIDE 0.125 MG PO TABS: 0.1250 mg | ORAL_TABLET | Freq: Every day | ORAL | Status: DC

## 2018-09-14 MED ORDER — CHLORHEXIDINE GLUCONATE CLOTH 2 % EX PADS
6.0000 | MEDICATED_PAD | Freq: Every day | CUTANEOUS | Status: DC
  Administered 2018-09-15 – 2018-09-16 (×2): 6 via TOPICAL

## 2018-09-14 MED ORDER — LEVALBUTEROL HCL 0.63 MG/3ML IN NEBU: 0.6300 mg | INHALATION_SOLUTION | Freq: Four times a day (QID) | RESPIRATORY_TRACT | Status: DC | PRN

## 2018-09-14 MED ORDER — PRAMIPEXOLE DIHYDROCHLORIDE 0.25 MG PO TABS
0.2500 mg | ORAL_TABLET | Freq: Every day | ORAL | Status: DC
  Administered 2018-09-14: 0.25 mg via ORAL
  Filled 2018-09-14 (×2): qty 1

## 2018-09-14 MED ORDER — ACETAMINOPHEN 650 MG RE SUPP: 650.0000 mg | Freq: Four times a day (QID) | RECTAL | Status: DC | PRN

## 2018-09-14 MED ORDER — CARVEDILOL 6.25 MG PO TABS
6.2500 mg | ORAL_TABLET | Freq: Two times a day (BID) | ORAL | Status: DC
  Filled 2018-09-14: qty 1

## 2018-09-14 MED ORDER — ONDANSETRON HCL 4 MG/2ML IJ SOLN: 4.0000 mg | Freq: Four times a day (QID) | INTRAMUSCULAR | Status: DC | PRN

## 2018-09-14 MED ORDER — SERTRALINE HCL 50 MG PO TABS
50.0000 mg | ORAL_TABLET | ORAL | Status: DC
  Administered 2018-09-15 – 2018-09-16 (×2): 50 mg via ORAL
  Filled 2018-09-14 (×2): qty 1

## 2018-09-14 MED ORDER — ASPIRIN EC 81 MG PO TBEC
81.0000 mg | DELAYED_RELEASE_TABLET | Freq: Every day | ORAL | Status: DC
  Administered 2018-09-14 – 2018-09-16 (×3): 81 mg via ORAL
  Filled 2018-09-14 (×3): qty 1

## 2018-09-14 MED ORDER — ACETAMINOPHEN 325 MG PO TABS: 650.0000 mg | ORAL_TABLET | Freq: Four times a day (QID) | ORAL | Status: DC | PRN

## 2018-09-14 MED ORDER — MUPIROCIN 2 % EX OINT
1.0000 "application " | TOPICAL_OINTMENT | Freq: Two times a day (BID) | CUTANEOUS | Status: DC
  Administered 2018-09-14 – 2018-09-16 (×4): 1 via NASAL
  Filled 2018-09-14: qty 22

## 2018-09-14 MED ORDER — INFLUENZA VAC SPLIT HIGH-DOSE 0.5 ML IM SUSY
0.5000 mL | PREFILLED_SYRINGE | INTRAMUSCULAR | Status: AC
  Administered 2018-09-16: 0.5 mL via INTRAMUSCULAR
  Filled 2018-09-14: qty 0.5

## 2018-09-14 MED ORDER — MAGNESIUM CITRATE PO SOLN
1.0000 | Freq: Once | ORAL | Status: DC | PRN
  Filled 2018-09-14: qty 296

## 2018-09-14 MED ORDER — ALPRAZOLAM 0.25 MG PO TABS
0.2500 mg | ORAL_TABLET | Freq: Two times a day (BID) | ORAL | Status: DC | PRN
  Administered 2018-09-14 – 2018-09-15 (×2): 0.25 mg via ORAL
  Filled 2018-09-14 (×3): qty 1

## 2018-09-14 MED ORDER — FLUTICASONE PROPIONATE 50 MCG/ACT NA SUSP
2.0000 | Freq: Two times a day (BID) | NASAL | Status: DC | PRN
  Filled 2018-09-14: qty 16

## 2018-09-14 MED ORDER — FUROSEMIDE 10 MG/ML IJ SOLN
40.0000 mg | Freq: Once | INTRAMUSCULAR | Status: AC
  Administered 2018-09-14: 40 mg via INTRAVENOUS
  Filled 2018-09-14: qty 4

## 2018-09-14 MED ORDER — CARVEDILOL 3.125 MG PO TABS: 3.1250 mg | ORAL_TABLET | Freq: Two times a day (BID) | ORAL | Status: DC

## 2018-09-14 MED ORDER — HYDROCORTISONE 1 % EX OINT
1.0000 "application " | TOPICAL_OINTMENT | Freq: Two times a day (BID) | CUTANEOUS | Status: DC | PRN
  Filled 2018-09-14: qty 28

## 2018-09-14 MED ORDER — TRAZODONE HCL 50 MG PO TABS
50.0000 mg | ORAL_TABLET | Freq: Every evening | ORAL | Status: DC | PRN
  Administered 2018-09-15: 50 mg via ORAL
  Filled 2018-09-14: qty 1

## 2018-09-14 MED ORDER — KETOROLAC TROMETHAMINE 15 MG/ML IJ SOLN: 15.0000 mg | Freq: Four times a day (QID) | INTRAMUSCULAR | Status: DC | PRN

## 2018-09-14 MED ORDER — SENNOSIDES-DOCUSATE SODIUM 8.6-50 MG PO TABS: 1.0000 | ORAL_TABLET | Freq: Every evening | ORAL | Status: DC | PRN

## 2018-09-14 MED ORDER — PANTOPRAZOLE SODIUM 40 MG PO TBEC
40.0000 mg | DELAYED_RELEASE_TABLET | Freq: Every day | ORAL | Status: DC
  Administered 2018-09-14 – 2018-09-16 (×3): 40 mg via ORAL
  Filled 2018-09-14 (×3): qty 1

## 2018-09-14 MED ORDER — DOCUSATE SODIUM 100 MG PO CAPS
100.0000 mg | ORAL_CAPSULE | Freq: Two times a day (BID) | ORAL | Status: DC
  Administered 2018-09-14 – 2018-09-16 (×5): 100 mg via ORAL
  Filled 2018-09-14 (×5): qty 1

## 2018-09-14 MED ORDER — TRIAMCINOLONE 0.1 % CREAM:EUCERIN CREAM 1:1
1.0000 "application " | TOPICAL_CREAM | Freq: Two times a day (BID) | CUTANEOUS | Status: DC | PRN
  Filled 2018-09-14: qty 1

## 2018-09-14 MED ORDER — CARVEDILOL 3.125 MG PO TABS
3.1250 mg | ORAL_TABLET | Freq: Two times a day (BID) | ORAL | Status: DC
  Administered 2018-09-15 – 2018-09-16 (×3): 3.125 mg via ORAL
  Filled 2018-09-14 (×3): qty 1

## 2018-09-14 MED ORDER — SORBITOL 70 % SOLN: 30.0000 mL | Freq: Every day | Status: DC | PRN

## 2018-09-14 NOTE — ED Notes (Signed)
Gave patient the urinal 

## 2018-09-14 NOTE — ED Provider Notes (Signed)
[facility] EMERGENCY DEPARTMENT Provider Note   CSN: [id number] Arrival date & time: 09/14/18  0510     History   Chief Complaint Chief Complaint  Patient presents with  . Pacemeaker Check    HPI Micheal Nicholson is a 84 y.o. male.  Patient presents to the emergency department with complaints of pain.  Patient reports that when he lies on his right side he gets pain across the top of his chest.  It only happens when he rolls onto the right side.  He denies any injury.  He is concerned because he thinks it might be coming from his pacemaker.  He denies persistent chest pain.  He has not had any shortness of breath.     Past Medical History:  Diagnosis Date  . Anxiety   . Arthritis   . BPH (benign prostatic hyperplasia)   . CHF (congestive heart failure) ([place])   . Chronic combined systolic and diastolic CHF, NYHA class 2 ([place]) 04/17/2015  . GERD (gastroesophageal reflux disease)   . Hypertension   . Stroke ([place])   . Symptomatic sinus bradycardia    PPM Dr. Rayann Heman, 01/13/16, STJ device    Patient Active Problem List   Diagnosis Date Noted  . Metabolic encephalopathy 88/28/0034  . At high risk for falls 03/26/2017  . Acute on chronic systolic CHF (congestive heart failure) ([place]) 10/08/2016  . Acute respiratory failure with hypoxia ([place]) 10/08/2016  . CKD (chronic kidney disease), stage III ([place]) 10/08/2016  . Sacral fracture, closed ([place]) 10/08/2016  . Hypoxia   . Hypokalemia 08/24/2016  . Community acquired pneumonia 08/23/2016  . Pruritus 08/09/2016  . Chronic anticoagulation 04/28/2016  . Cardiac pacemaker in situ 04/28/2016  . Chronic systolic congestive heart failure ([place]) 04/28/2016  . Left hemiparesis ([place]) 04/25/2016  . Encounter for therapeutic drug monitoring 04/19/2016  . Cardiomyopathy, ischemic   . Cerebral embolism with cerebral infarction 03/03/2016  . Normocytic anemia   . Thrombocytopenia ([place])   . Acute left hemiparesis  ([place])   . Peripheral neuropathic pain   . Hemiplegia ([place]) 03/02/2016  . Adenopathy   . Long term current use of anticoagulant therapy 01/23/2016  . CAD (coronary artery disease) 01/23/2016  . Second degree Mobitz II AV block 01/13/2016  . Persistent atrial fibrillation ([place])   . Atrial fibrillation with slow ventricular response ([place])   . AKI (acute kidney injury) ([place])   . Bradycardia 12/29/2015  . Acute renal failure (ARF) ([place]) 12/29/2015  . Hyponatremia 12/29/2015  . Hypotension 12/29/2015  . Elevated troponin 12/29/2015  . Acute upper respiratory infection 12/23/2015  . RLS (restless legs syndrome) 09/09/2015  . Chronic combined systolic and diastolic CHF, NYHA class 2 ([place]) 04/17/2015  . Vitamin D deficiency 03/11/2015  . Cardiomyopathy -etiology uncertain 91/79/1505  . Paroxysmal atrial fibrillation ([place]) 08/27/2014  . 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 H/O: stroke 08/27/2014  . GERD (gastroesophageal reflux disease) 02/07/2011  . BPH (benign prostatic hypertrophy) 07/12/2010  . Anxiety state 11/02/2009  . Essential hypertension 11/02/2009    Past Surgical History:  Procedure Laterality Date  . CARDIAC CATHETERIZATION  2006   Negative / 4 years ago  . EP IMPLANTABLE DEVICE N/A 01/13/2016   Procedure: Pacemaker Implant;  Surgeon: Thompson Grayer, MD;  Location: [facility];  Service: Cardiovascular;  Laterality: N/A;        Home Medications    Prior to Admission medications   Medication Sig Start Date End Date Taking? Authorizing Provider  acetaminophen (TYLENOL) 325 MG tablet Take 325 mg by mouth every 6 (six) hours as needed for mild pain or headache.    [provider]  ALPRAZolam Duanne Moron) 0.5 MG tablet TAKE 1 TABLET BY MOUTH TWICE A DAY AS NEEDED FOR ANXIETY 07/24/18   Donne Hazel, MD  bisacodyl (DULCOLAX) 5 MG EC tablet Take 5 mg by mouth daily as needed for moderate constipation.    [provider]  carvedilol (COREG) 3.125 MG tablet  Take 1 tablet (3.125 mg total) by mouth 2 (two) times daily with a meal. 07/24/18 08/23/18  Donne Hazel, MD  docusate sodium (COLACE) 100 MG capsule Take 100 mg by mouth 2 (two) times daily.    [provider]  fluticasone (FLONASE) 50 MCG/ACT nasal spray Place 2 sprays into both nostrils 2 (two) times daily as needed for rhinitis.     [provider]  furosemide (LASIX) 40 MG tablet PLEASE SEE ATTACHED FOR DETAILED DIRECTIONS Patient taking differently: Take 40 mg by mouth in the morning 07/17/18   Croitoru, Mihai, MD  HYDROcodone-acetaminophen (NORCO) 5-325 MG tablet Take 1 tablet by mouth every 6 (six) hours as needed. 08/24/18   Veryl Speak, MD  hydrocortisone (CORTIZONE-10) 1 % ointment Apply 1 application topically 2 (two) times daily as needed (to affected areas for itching).     [provider]  lisinopril (PRINIVIL,ZESTRIL) 5 MG tablet TAKE 1 TABLET DAILY 01/21/18   Burchette, Alinda Sierras, MD  omeprazole (PRILOSEC) 20 MG capsule Take 1 capsule (20 mg total) by mouth daily as needed (for acid reflex). 11/24/16   Burchette, Alinda Sierras, MD  omeprazole (PRILOSEC) 20 MG capsule TAKE 1 CAPSULE (20 MG TOTAL) BY MOUTH DAILY AS NEEDED (FOR ACID REFLEX). 08/30/18   Burchette, Alinda Sierras, MD  pramipexole (MIRAPEX) 0.125 MG tablet TAKE 2 TABLETS BY MOUTH AT NIGHT FOR RESTLESS LEGS 07/29/18   Burchette, Alinda Sierras, MD  predniSONE (DELTASONE) 10 MG tablet Take 1 tablet (10 mg total) by mouth daily with breakfast. Taper dose: 60mg  po daily x 2 days, then 40mg  po daily x 2 days, then 20mg  po daily x 2 days, then 10mg  po daily x 2 days, then 5mg  po daily x 2 days, then stop, zero refills 07/24/18   Donne Hazel, MD  senna-docusate (SENOKOT-S) 8.6-50 MG tablet Take 1 tablet by mouth at bedtime as needed for mild constipation. 03/06/16   Dhungel, Nishant, MD  sertraline (ZOLOFT) 100 MG tablet TAKE 1/2 TABLET DAILY NEEDS OFFICE VISIT Patient taking differently: Take 50 mg by mouth every morning.   05/29/18   Burchette, Alinda Sierras, MD  Triamcinolone Acetonide (TRIAMCINOLONE 0.1 % CREAM : EUCERIN) CREA Apply 1 application topically 2 (two) times daily as needed for rash, itching or irritation.    [provider]    Family History Family History  Problem Relation Age of Onset  . Pneumonia Father 33       died  . Lung cancer Brother   . Heart disease Brother   . Pancreatic cancer Son   . Healthy Son   . Heart failure Son     Social History Social History   Tobacco Use  . Smoking status: Former Smoker    Packs/day: 0.50    Years: 5.00    Pack years: 2.50  . Smokeless tobacco: Never Used  Substance Use Topics  . Alcohol use: Yes    Alcohol/week: 1.0 standard drinks    Types: 1 Shots of liquor per week  Comment: occ  . Drug use: No     Allergies   Chocolate   Review of Systems Review of Systems  Cardiovascular: Positive for chest pain.  All other systems reviewed and are negative.    Physical Exam Updated Vital Signs BP 121/66 (BP Location: Right Arm)   Pulse 71   Temp 98.4 F (36.9 C) (Oral)   Resp 13   Ht 5\' 6"  (1.676 m)   Wt 61 kg   SpO2 94%   BMI 21.71 kg/m   Physical Exam  Constitutional: He is oriented to person, place, and time. He appears well-developed and well-nourished. No distress.  HENT:  Head: Normocephalic and atraumatic.  Right Ear: Hearing normal.  Left Ear: Hearing normal.  Nose: Nose normal.  Mouth/Throat: Oropharynx is clear and moist and mucous membranes are normal.  Eyes: Pupils are equal, round, and reactive to light. Conjunctivae and EOM are normal.  Neck: Normal range of motion. Neck supple.  Cardiovascular: Regular rhythm, S1 normal and S2 normal. Exam reveals no gallop and no friction rub.  No murmur heard. Pulmonary/Chest: Effort normal and breath sounds normal. No respiratory distress. He exhibits tenderness.    Abdominal: Soft. Normal appearance and bowel sounds are normal. There is no hepatosplenomegaly.  There is no tenderness. There is no rebound, no guarding, no tenderness at McBurney's point and negative Murphy's sign. No hernia.  Musculoskeletal: Normal range of motion.       Right shoulder: He exhibits tenderness (AC joint). He exhibits normal range of motion and no deformity.  Neurological: He is alert and oriented to person, place, and time. He has normal strength. No cranial nerve deficit or sensory deficit. Coordination normal. GCS eye subscore is 4. GCS verbal subscore is 5. GCS motor subscore is 6.  Skin: Skin is warm, dry and intact. No rash noted. No cyanosis.  Psychiatric: He has a normal mood and affect. His speech is normal and behavior is normal. Thought content normal.  Nursing note and vitals reviewed.    ED Treatments / Results  Labs (all labs ordered are listed, but only abnormal results are displayed) Labs Reviewed  CBC WITH DIFFERENTIAL/PLATELET  BASIC METABOLIC PANEL  BRAIN NATRIURETIC PEPTIDE    EKG EKG Interpretation  Date/Time:  Saturday September 14 2018 05:19:15 EDT Ventricular Rate:  135 PR Interval:    QRS Duration: 194 QT Interval:  268 QTC Calculation: 330 R Axis:   -76 Text Interpretation:  VENTRICULAR PACED RHYTHM Confirmed by Orpah Greek [phone]) on 09/14/2018 6:06:20 AM   Radiology Dg Chest 2 View  Result Date: 09/14/2018 CLINICAL DATA:  84 y/o  M; sensation of pacemaker having moved. EXAM: CHEST - 2 VIEW COMPARISON:  07/19/2018 chest radiograph. FINDINGS: Stable cardiomegaly given projection and technique. Single lead pacemaker projecting over left upper chest. No significant interval rotation of the pacemaking device. Aortic atherosclerosis with calcification. Diffuse reticular and patchy basilar opacities probably representing interstitial and alveolar pulmonary edema. Small right pleural effusion. Moderate levocurvature of the thoracolumbar spine. IMPRESSION: 1. Interstitial and alveolar pulmonary edema increased from prior chest  radiograph. Small right effusion. 2. Pacemaking device projects over left upper chest, no significant interval rotation in comparison with prior study. 3. Stable cardiomegaly. 4.  Aortic Atherosclerosis (ICD10-I70.0). Electronically Signed   By: Kristine Garbe M.D.   On: 09/14/2018 06:29    Procedures Procedures (including critical care time)  Medications Ordered in ED Medications  furosemide (LASIX) injection 40 mg (has no administration in time range)  Initial Impression / Assessment and Plan / ED Course  I have reviewed the triage vital signs and the nursing notes.  Pertinent labs & imaging results that were available during my care of the patient were reviewed by me and considered in my medical decision making (see chart for details).     Patient presents to the emergency department with vague complaints of upper chest pain that only occurs when he lies on his right side.  Patient reported that he was concerned that it was secondary to his pacemaker shifting in his chest.  EKG shows ventricular paced rhythm, pacemaker is functional.  Chest x-ray shows no abnormality with the pacemaker, however, there is evidence of interstitial pulmonary edema.  Patient is not significantly hypoxic or short of breath.  Will initiate cardiac work-up and initiate diuresis.  Patient does have a known history of heart failure, is on Lasix.  Administered Lasix 40 mg IV.  Will sign out to oncoming ER physician to follow-up on lab results and progress after lasix.  Final Clinical Impressions(s) / ED Diagnoses   Final diagnoses:  Chest pain, unspecified type  Acute on chronic combined systolic and diastolic congestive heart failure [facility])    ED Discharge Orders    None       Orpah Greek, MD 09/14/18 [phone]

## 2018-09-14 NOTE — ED Notes (Signed)
Pt's O2 sats observed to be in the 80's on room air; pt placed on 2L O2 via ; O2 sats now in the 90's

## 2018-09-14 NOTE — ED Provider Notes (Signed)
Patient is an elderly 84 year old male with known systolic and diastolic heart failure pacemaker placement who was presenting today for more of a musculoskeletal pain in the right side of his chest and neck that woke him up from sleep last night.  Patient's pain is reproducible on exam however x-ray today here showed signs of diffuse fluid overload.  Patient does admit to having worsening swelling in his legs despite taking 40 mg of Lasix daily and oxygen saturation dropping to the 80s when not on nasal cannula.  Patient given IV Lasix here with mild diuresis but still requiring oxygen.  BNP is greater than 1000 and creatinine is at baseline.  Troponin within normal limits and EKG shows a paced rhythm.  Will admit for diuresis.   Blanchie Dessert, MD 09/14/18 [phone]

## 2018-09-14 NOTE — ED Triage Notes (Signed)
Pt bib [facility] from home d/t feeling like his pacemaker was moving.  Pt reported to EMS that he was on his right side when he woke up feeling like his pacemaker had moved.  Pt denied pain or associated sx.

## 2018-09-14 NOTE — H&P (Signed)
4        History and Physical   Patient: Micheal Nicholson                            PCP: [name], MD                    DOB: 1920/02/02            DOA: 09/14/2018 BJS:[id number]             DOS: 09/14/2018, 10:11 AM  Patient coming from: SNF I have personally reviewed patient's medical records, in electronic medical records, including: [facility] link, and care everywhere.    Chief Complaint:   Chief Complaint  Patient presents with  .  Shortness of breath, chest pain     History of present illness:   Micheal Nicholson is a 84 y.o. male from SNF, with multiple comorbidities, including systolic and diastolic CHF for the last ejection fracture of 30% presented with chest pain shortness of breath, satting in 80s on room air. Patient's past medical history is consistent with chronic systolic/diastolic congestive heart failure, cardiomyopathy, dysrhythmia on pacemaker, history of stroke and left-sided hemiparesis, GERD, hypertension, anxiety, arthritis, CKD, anemia, ect...  Patient right-sided chest pain is more pleuritic, positional.  Nonradiating.  Pain is reproducible.  Also complaining of some mild shortness of breath, worsening swelling in his legs despite taking his Lasix daily.  Also noted that his O2 sat has been dropping to 80s, he is not on his O2 nasal cannula.  Medical history significant of      ED Course:   She was evaluated in ED, cardiac enzymes were negative, EKG was paced, chest pain was reproducible, likely musculoskeletal in nature.  Patient vitals were stable with exception of O2 sat in 80s on room air, improved to 90s with 2 L of oxygen via nasal cannula Patient was found volume overloaded, is on chest x-ray, BNP greater than 1000, with stable baseline creatinine, She has received 1 dose of IV Lasix 40 mg, Positive for hospitalist to admit for volume overload, CHF exacerbation  Review of Systems: As per HPI otherwise 12 point review of systems  negative.    Assessment / Plan:   Principal Problem:   Acute on chronic combined systolic and diastolic CHF (congestive heart failure) (HCC) Active Problems:   Anxiety state   Essential hypertension   GERD (gastroesophageal reflux disease)   H/O: stroke   Cardiomyopathy -etiology uncertain   Chronic combined systolic and diastolic CHF, NYHA class 2 (HCC)   Atrial fibrillation with slow ventricular response (HCC)   Left hemiparesis (HCC)   CKD (chronic kidney disease), stage III (HCC)   SOB (shortness of breath)   Chest pain in adult  SOB (shortness of breath) due to acute on chronic combined systolic and diastolic CHF (congestive heart failure) (HCC)/NYHA class II/III -Patient will be admitted to monitored floor -Chest x-ray reviewed consistent with volume overload, proBNP greater than thousand in setting of baseline BUN/creatinine, -We will initiate IV diuretics Lasix 40 mg IV twice daily -We will monitor I's and O's, daily weight -Monitor his kidney function  Chest pain -Likely muscular skeletal nevertheless we will recycle cardiac enzymes x3 sets -Continue supportive, medical management, continue aspirin, beta-blocker,   Cardiomyopathy -etiology uncertain -Continue medical management, medication for CHF, as above    Atrial fibrillation with slow ventricular response (HCC)/with pacemaker -Pacemaker in place has not  been interrogated, is not complaining of any overt palpitation, chest pain that is cardiac in nature. -Continue beta-blockers  History of CVA with left hemiparesis (HCC) -Currently stable, continue aspirin   CKD (chronic kidney disease), stage III (HCC) -Serum creatinine at baseline 22/1.59 -Monitor closely, avoid nephrotoxins    Anxiety state -Stable, continue PRN Xanax    Essential hypertension -Blood pressure stable, continue beta-blocker, - holding ACE inhibitor as patient will be on IV Lasix twice a day -    GERD (gastroesophageal reflux  disease) -Continue PPI  Anemia of chronic disease -H&H stable, continue to monitor  DVT prophylaxis: Heparin SQ  Code Status:   Code Status: Full Code  Family Communication:  The above findings and plan of care has been discussed with patient and family in detail, they expressed understanding and agreement of above plan.   Disposition Plan: >3 days  Consults called:  None  Admission status: Patient will be admitted as Inpatient, with a greater than 2 midnight length of stay.  ----------------------------------------------------------------------------------------------------------------------  Allergies  Allergen Reactions  . Chocolate Other (See Comments)    Acid reflux     Home MEDs:  Prior to Admission medications   Medication Sig Start Date End Date Taking? Authorizing Provider  ALPRAZolam Duanne Moron) 0.5 MG tablet TAKE 1 TABLET BY MOUTH TWICE A DAY AS NEEDED FOR ANXIETY 07/24/18   Donne Hazel, MD  carvedilol (COREG) 3.125 MG tablet Take 1 tablet (3.125 mg total) by mouth 2 (two) times daily with a meal. 07/24/18 08/23/18  Donne Hazel, MD  fluticasone (FLONASE) 50 MCG/ACT nasal spray Place 2 sprays into both nostrils 2 (two) times daily as needed for rhinitis.     [provider]  hydrocortisone (CORTIZONE-10) 1 % ointment Apply 1 application topically 2 (two) times daily as needed (to affected areas for itching).     [provider]  omeprazole (PRILOSEC) 20 MG capsule TAKE 1 CAPSULE (20 MG TOTAL) BY MOUTH DAILY AS NEEDED (FOR ACID REFLEX). 08/30/18   Burchette, Alinda Sierras, MD  pramipexole (MIRAPEX) 0.125 MG tablet TAKE 2 TABLETS BY MOUTH AT NIGHT FOR RESTLESS LEGS 07/29/18   Burchette, Alinda Sierras, MD  predniSONE (DELTASONE) 10 MG tablet Take 1 tablet (10 mg total) by mouth daily with breakfast. Taper dose: 60mg  po daily x 2 days, then 40mg  po daily x 2 days, then 20mg  po daily x 2 days, then 10mg  po daily x 2 days, then 5mg  po daily x 2 days, then stop, zero refills  07/24/18   Donne Hazel, MD  sertraline (ZOLOFT) 100 MG tablet TAKE 1/2 TABLET DAILY NEEDS OFFICE VISIT Patient taking differently: Take 50 mg by mouth every morning.  05/29/18   Burchette, Alinda Sierras, MD  Triamcinolone Acetonide (TRIAMCINOLONE 0.1 % CREAM : EUCERIN) CREA Apply 1 application topically 2 (two) times daily as needed for rash, itching or irritation.    [provider]    PRN MEDs: acetaminophen **OR** acetaminophen, albuterol, ALPRAZolam, fluticasone, HYDROcodone-acetaminophen, hydrocortisone, ketorolac, levalbuterol, magnesium citrate, ondansetron **OR** ondansetron (ZOFRAN) IV, senna-docusate, sorbitol, traZODone, triamcinolone 0.1 % cream : eucerin  Past Medical History:  Diagnosis Date  . Anxiety   . Arthritis   . BPH (benign prostatic hyperplasia)   . CHF (congestive heart failure) ([place])   . Chronic combined systolic and diastolic CHF, NYHA class 2 ([place]) 04/17/2015  . GERD (gastroesophageal reflux disease)   . Hypertension   . Stroke ([place])   . Symptomatic sinus bradycardia    PPM Dr. Rayann Heman,  01/13/16, STJ device    Past Surgical History:  Procedure Laterality Date  . CARDIAC CATHETERIZATION  2006   Negative / 4 years ago  . EP IMPLANTABLE DEVICE N/A 01/13/2016   Procedure: Pacemaker Implant;  Surgeon: Thompson Grayer, MD;  Location: [facility];  Service: Cardiovascular;  Laterality: N/A;     reports that he has quit smoking. He has a 2.50 pack-year smoking history. He has never used smokeless tobacco. He reports that he drinks about 1.0 standard drinks of alcohol per week. He reports that he does not use drugs.   Family History  Problem Relation Age of Onset  . Pneumonia Father 24       died  . Lung cancer Brother   . Heart disease Brother   . Pancreatic cancer Son   . Healthy Son   . Heart failure Son     Physical Exam:   Vitals:   09/14/18 0745 09/14/18 0830 09/14/18 0900 09/14/18 0946  BP: 128/66 135/87 123/68 127/78  Pulse: 62 65 62 (!)  56  Resp: 14 19 17 20   Temp:      TempSrc:      SpO2: 100% 100% 95% 98%  Weight:      Height:        Constitutional: NAD, calm, comfortable Vitals:   09/14/18 0745 09/14/18 0830 09/14/18 0900 09/14/18 0946  BP: 128/66 135/87 123/68 127/78  Pulse: 62 65 62 (!) 56  Resp: 14 19 17 20   Temp:      TempSrc:      SpO2: 100% 100% 95% 98%  Weight:      Height:       Eyes: PERRL, lids and conjunctivae normal ENMT: Mucous membranes are moist. Posterior pharynx clear of any exudate or lesions.Normal dentition.  Neck: normal, supple, no masses, no thyromegaly Respiratory: clear to auscultation bilaterally, no wheezing, no crackles. Normal respiratory effort. No accessory muscle use.  Cardiovascular: Regular rate and rhythm, no murmurs / rubs / gallops. No extremity edema. 2+ pedal pulses. No carotid bruits.  Abdomen: no tenderness, no masses palpated. No hepatosplenomegaly. Bowel sounds positive.  Musculoskeletal: no clubbing / cyanosis. No joint deformity upper and lower extremities. Good ROM, no contractures. Normal muscle tone.  Neurologic: CN II-XII grossly intact. Sensation intact, DTR normal. Strength 5/5 in all 4.  Psychiatric: Normal judgment and insight. Alert and oriented x 3. Normal mood.  Skin: no rashes, lesions, ulcers. No induration Decubitus/ulcers:  Urinary catheter: Chronic indwelling/was placed in this admission  Labs on admission:    I have personally reviewed following labs and imaging studies  CBC: Recent Labs  Lab 09/14/18 0741  WBC 3.9*  NEUTROABS 2.7  HGB 10.3*  HCT 32.9*  MCV 92.4  PLT 425   Basic Metabolic Panel: Recent Labs  Lab 09/14/18 0741  NA 139  K 4.4  CL 108  CO2 22  GLUCOSE 98  BUN 23  CREATININE 1.59*  CALCIUM 8.5*  Urine analysis:    Component Value Date/Time   COLORURINE YELLOW 07/20/2018 1832   APPEARANCEUR CLEAR 07/20/2018 1832   LABSPEC 1.023 07/20/2018 1832   PHURINE 5.0 07/20/2018 1832   GLUCOSEU NEGATIVE 07/20/2018  1832   HGBUR NEGATIVE 07/20/2018 [place] NEGATIVE 07/20/2018 1832   BILIRUBINUR negative 11/10/2013 1713   KETONESUR 5 (A) 07/20/2018 1832   PROTEINUR NEGATIVE 07/20/2018 1832   UROBILINOGEN 1.0 03/14/2015 1608   NITRITE NEGATIVE 07/20/2018 1832   LEUKOCYTESUR NEGATIVE 07/20/2018 1832  Radiologic Exams on Admission:   Dg Chest 2 View  Result Date: 09/14/2018 CLINICAL DATA:  84 y/o  M; sensation of pacemaker having moved. EXAM: CHEST - 2 VIEW COMPARISON:  07/19/2018 chest radiograph. FINDINGS: Stable cardiomegaly given projection and technique. Single lead pacemaker projecting over left upper chest. No significant interval rotation of the pacemaking device. Aortic atherosclerosis with calcification. Diffuse reticular and patchy basilar opacities probably representing interstitial and alveolar pulmonary edema. Small right pleural effusion. Moderate levocurvature of the thoracolumbar spine. IMPRESSION: 1. Interstitial and alveolar pulmonary edema increased from prior chest radiograph. Small right effusion. 2. Pacemaking device projects over left upper chest, no significant interval rotation in comparison with prior study. 3. Stable cardiomegaly. 4.  Aortic Atherosclerosis (ICD10-I70.0). Electronically Signed   By: Kristine Garbe M.D.   On: 09/14/2018 06:29    EKG:   Independently reviewed.   Orders placed or performed during the hospital encounter of 09/14/18  . EKG 12-Lead  . EKG 12-Lead  . EKG 12-Lead     Time spent: > than  40  Min.   Deatra James MD [facility] ,  Pager [phone]  If 7PM-7AM, please contact night-coverage [email or web address]  Password [facility] 09/14/2018, 10:11 AM

## 2018-09-15 ENCOUNTER — Other Ambulatory Visit (HOSPITAL_COMMUNITY): Payer: Medicare HMO

## 2018-09-15 ENCOUNTER — Inpatient Hospital Stay (HOSPITAL_COMMUNITY): Payer: Medicare HMO

## 2018-09-15 DIAGNOSIS — I361 Nonrheumatic tricuspid (valve) insufficiency: Secondary | ICD-10-CM

## 2018-09-15 DIAGNOSIS — I340 Nonrheumatic mitral (valve) insufficiency: Secondary | ICD-10-CM

## 2018-09-15 DIAGNOSIS — I5043 Acute on chronic combined systolic (congestive) and diastolic (congestive) heart failure: Secondary | ICD-10-CM

## 2018-09-15 DIAGNOSIS — I351 Nonrheumatic aortic (valve) insufficiency: Secondary | ICD-10-CM

## 2018-09-15 LAB — CBC
HCT: 32.4 % — ABNORMAL LOW (ref 39.0–52.0)
HEMOGLOBIN: 10.7 g/dL — AB (ref 13.0–17.0)
MCH: 29.9 pg (ref 26.0–34.0)
MCHC: 33 g/dL (ref 30.0–36.0)
MCV: 90.5 fL (ref 78.0–100.0)
Platelets: 211 10*3/uL (ref 150–400)
RBC: 3.58 MIL/uL — AB (ref 4.22–5.81)
RDW: 14.7 % (ref 11.5–15.5)
WBC: 4.2 10*3/uL (ref 4.0–10.5)

## 2018-09-15 LAB — ECHOCARDIOGRAM COMPLETE
Height: 66 in
Weight: 2148.8 oz

## 2018-09-15 LAB — PROTIME-INR
INR: 1.26
Prothrombin Time: 15.7 seconds — ABNORMAL HIGH (ref 11.4–15.2)

## 2018-09-15 LAB — GLUCOSE, CAPILLARY: Glucose-Capillary: 92 mg/dL (ref 70–99)

## 2018-09-15 LAB — APTT: aPTT: 43 seconds — ABNORMAL HIGH (ref 24–36)

## 2018-09-15 LAB — BRAIN NATRIURETIC PEPTIDE: B NATRIURETIC PEPTIDE 5: 1147.5 pg/mL — AB (ref 0.0–100.0)

## 2018-09-15 MED ORDER — PRAMIPEXOLE DIHYDROCHLORIDE 0.25 MG PO TABS
0.2500 mg | ORAL_TABLET | Freq: Every day | ORAL | Status: DC
  Administered 2018-09-15: 0.25 mg via ORAL
  Filled 2018-09-15 (×2): qty 1

## 2018-09-15 NOTE — Evaluation (Signed)
Physical Therapy Evaluation Patient Details Name: Micheal Nicholson MRN: [id number] DOB: 01/08/1920 Today's Date: 09/15/2018   History of Present Illness  Micheal Nicholson is a 84 y.o. male from SNF, with multiple comorbidities, including systolic and diastolic CHF for the last ejection fracture of 30% presented with chest pain shortness of breath, satting in 80s on room air. Patient's past medical history is consistent with chronic systolic/diastolic congestive heart failure, cardiomyopathy, dysrhythmia on pacemaker, history of stroke and left-sided hemiparesis, GERD, hypertension, anxiety, arthritis, CKD, anemia.    Clinical Impression  Pt admitted with above diagnosis. Pt currently with functional limitations due to the deficits listed below (see PT Problem List). PTA, pt reports living alone with use of RW, denies falls, family stops by once a day to check on him or help with chores. Today pt requiring physical assistance to stand, ambulating hallway with contact guard due to unsteadiness. SpO2 WNL on RA, HR 66-89 max with multiple PVCs noted, RN made aware. Pt wishes to return home but it is my opinion he is at risk for falling and would benefit from SNF first, and may want to consider ALF afterwards, no family present to discuss. Pt will benefit from skilled PT to increase their independence and safety with mobility to allow discharge to the venue listed below.       Follow Up Recommendations SNF;Supervision/Assistance - 24 hour    Equipment Recommendations  (TBD next venue)    Recommendations for Other Services       Precautions / Restrictions Precautions Precautions: Fall Restrictions Weight Bearing Restrictions: No      Mobility  Bed Mobility Overal bed mobility: Modified Independent                Transfers Overall transfer level: Needs assistance   Transfers: Sit to/from Stand Sit to Stand: Min assist         General transfer comment: Min A to power  up into RW and provide stability during transfer  Ambulation/Gait Ambulation/Gait assistance: Min guard Gait Distance (Feet): 100 Feet Assistive device: Rolling walker (2 wheeled) Gait Pattern/deviations: Step-to pattern Gait velocity: decreased   General Gait Details: SpO2 WNL, HR 66-89 max with mulitple PVCs throughout session noted, RN made aware. Pt without DOE or overt LOB however unsteady and reports feeling weaker than baseline  Stairs            Wheelchair Mobility    Modified Rankin (Stroke Patients Only)       Balance Overall balance assessment: Needs assistance   Sitting balance-Leahy Scale: Good       Standing balance-Leahy Scale: Poor                               Pertinent Vitals/Pain Pain Assessment: No/denies pain    Home Living Family/patient expects to be discharged to:: Skilled nursing facility Living Arrangements: Alone                    Prior Function Level of Independence: Independent with assistive device(s)         Comments: pt ambulates with RW     Hand Dominance        Extremity/Trunk Assessment   Upper Extremity Assessment Upper Extremity Assessment: Overall WFL for tasks assessed    Lower Extremity Assessment Lower Extremity Assessment: Overall WFL for tasks assessed(grossly 4-/5 BLE)       Communication   Communication: HOH  Cognition  Arousal/Alertness: Awake/alert Behavior During Therapy: WFL for tasks assessed/performed                                          General Comments      Exercises     Assessment/Plan    PT Assessment Patient needs continued PT services  PT Problem List Decreased strength;Decreased balance;Cardiopulmonary status limiting activity;Decreased activity tolerance;Decreased mobility;Decreased cognition;Decreased safety awareness       PT Treatment Interventions DME instruction;Gait training;Stair training;Functional mobility  training;Therapeutic exercise;Therapeutic activities;Balance training    PT Goals (Current goals can be found in the Care Plan section)  Acute Rehab PT Goals Patient Stated Goal: go home PT Goal Formulation: With patient Time For Goal Achievement: 09/29/18 Potential to Achieve Goals: Fair    Frequency Min 3X/week   Barriers to discharge Decreased caregiver support does not have 24/7 assistance    Co-evaluation               AM-PAC PT "6 Clicks" Daily Activity  Outcome Measure Difficulty turning over in bed (including adjusting bedclothes, sheets and blankets)?: None Difficulty moving from lying on back to sitting on the side of the bed? : None Difficulty sitting down on and standing up from a chair with arms (e.g., wheelchair, bedside commode, etc,.)?: Unable Help needed moving to and from a bed to chair (including a wheelchair)?: A Little Help needed walking in hospital room?: A Little Help needed climbing 3-5 steps with a railing? : A Little 6 Click Score: 18    End of Session Equipment Utilized During Treatment: Gait belt Activity Tolerance: Patient tolerated treatment well Patient left: in bed;with call bell/phone within reach;with bed alarm set Nurse Communication: Mobility status PT Visit Diagnosis: Unsteadiness on feet (R26.81);Muscle weakness (generalized) (M62.81);Difficulty in walking, not elsewhere classified (R26.2)    Time: 1761-6073 PT Time Calculation (min) (ACUTE ONLY): 25 min   Charges:   PT Evaluation $PT Eval Low Complexity: 1 Low PT Treatments $Gait Training: 8-22 mins        Reinaldo Berber, PT, DPT Acute Rehabilitation Services Pager: [phone] Office: [phone]    Reinaldo Berber 09/15/2018, 5:58 PM

## 2018-09-15 NOTE — Progress Notes (Signed)
1        PROGRESS NOTE    Patient: Micheal Nicholson                            PCP: [name], MD                    DOB: 1920/04/05            DOA: 09/14/2018 [id number]             DOS: 09/15/2018, 9:51 AM   LOS: 1 day   Date of Service: The patient was seen and examined on 09/15/2018  Subjective:   Patient was seen and examined.  Awake alert oriented this morning denies of having any chest pain. Reporting of improved shortness of breath. No issues overnight.   Brief Narrative:   Micheal Nicholson is a 84 y.o. male from SNF, with multiple comorbidities, including systolic and diastolic CHF for the last ejection fracture of 30% presented with chest pain shortness of breath, satting in 80s on room air. Patient's past medical history is consistent with chronic systolic/diastolic congestive heart failure, cardiomyopathy, dysrhythmia on pacemaker, history of stroke and left-sided hemiparesis, GERD, hypertension, anxiety, arthritis, CKD, anemia, ect...  Patient right-sided chest pain is more pleuritic, positional.  Nonradiating.  Pain is reproducible.  Also complaining of some mild shortness of breath, worsening swelling in his legs despite taking his Lasix daily.  Also noted that his O2 sat has been dropping to 80s, he is not on his O2 nasal cannula.  Principal Problem:   Acute on chronic combined systolic and diastolic CHF (congestive heart failure) (HCC) Active Problems:   Anxiety state   Essential hypertension   GERD (gastroesophageal reflux disease)   H/O: stroke   Cardiomyopathy -etiology uncertain   Chronic combined systolic and diastolic CHF, NYHA class 2 (HCC)   Atrial fibrillation with slow ventricular response (HCC)   Left hemiparesis (HCC)   CKD (chronic kidney disease), stage III (HCC)   SOB (shortness of breath)   Chest pain in adult    Assessment & Plan:    SOB (shortness of breath) due to acute on chronic combined systolic and diastolic CHF  (congestive heart failure) (HCC)/NYHA class II/III -Morning of improved shortness of breath -Chest x-ray reviewed consistent with volume overload, proBNP greater than thousand in setting of baseline BUN/creatinine, -We will initiate IV diuretics Lasix 40 mg IV twice daily -We will monitor I's and O's, daily weight, diuresed over 1.8 liters,  Weight 61 -->60.9 Kg -Monitor his kidney function, Cr. 1.54  Chest pain -Likely muscular skeletal nevertheless we will recycle cardiac enzymes x3 sets -Continue supportive, medical management, continue aspirin, beta-blocker,   Cardiomyopathy -etiology uncertain -Continue medical management, medication for CHF, as above    Atrial fibrillation with slow ventricular response (HCC)/with pacemaker -Pacemaker in place has not been interrogated, is not complaining of any overt palpitation, chest pain that is cardiac in nature. -Continue beta-blockers  History of CVA with left hemiparesis (HCC) -Currently stable, continue aspirin   CKD (chronic kidney disease), stage III (HCC) -Serum creatinine  1.59 --> 1.54 -Monitor closely, avoid nephrotoxins    Anxiety state -Stable, continue PRN Xanax    Essential hypertension -Blood pressure stable, continue beta-blocker, - holding ACE inhibitor as patient will be on IV Lasix twice a day -    GERD (gastroesophageal reflux disease) -Continue PPI  Anemia of chronic disease -H&H stable, continue  to monitor  DVT prophylaxis: Heparin SQ  Code Status:   Code Status: Full Code  Family Communication:  The above findings and plan of care has been discussed with patient and family in detail, they expressed understanding and agreement of above plan.   Disposition Plan: >3 days  Consults called:  None   Procedures:   No admission procedures for hospital encounter.   Antimicrobials:  Anti-infectives (From admission, onward)   None       Medication:  . aspirin EC  81 mg Oral Daily   . carvedilol  3.125 mg Oral BID WC  . Chlorhexidine Gluconate Cloth  6 each Topical Q0600  . docusate sodium  100 mg Oral BID  . furosemide  40 mg Intravenous Q12H  . heparin  5,000 Units Subcutaneous Q8H  . Influenza vac split quadrivalent PF  0.5 mL Intramuscular Tomorrow-1000  . mupirocin ointment  1 application Nasal BID  . pantoprazole  40 mg Oral Daily  . pramipexole  0.25 mg Oral Daily  . sertraline  50 mg Oral BH-q7a    acetaminophen **OR** acetaminophen, albuterol, ALPRAZolam, fluticasone, HYDROcodone-acetaminophen, hydrocortisone, ketorolac, levalbuterol, magnesium citrate, ondansetron **OR** ondansetron (ZOFRAN) IV, senna-docusate, sorbitol, traZODone, triamcinolone 0.1 % cream : eucerin     Objective:   Vitals:   09/14/18 2046 09/15/18 0414 09/15/18 0545 09/15/18 0857  BP: 112/80  130/67 124/77  Pulse: 64   60  Resp: 16  18   Temp: 98.5 F (36.9 C)  98.2 F (36.8 C)   TempSrc: Oral  Oral   SpO2: 93%  93%   Weight:  60.9 kg    Height:        Intake/Output Summary (Last 24 hours) at 09/15/2018 0951 Last data filed at 09/15/2018 0554 Gross per 24 hour  Intake 480 ml  Output 1850 ml  Net -1370 ml   Filed Weights   09/14/18 0526 09/15/18 0414  Weight: 61 kg 60.9 kg     Examination:    General exam: Appears calm and comfortable  BP 124/77 (BP Location: Left Arm)   Pulse 60   Temp 98.2 F (36.8 C) (Oral)   Resp 18   Ht 5\' 6"  (1.676 m)   Wt 60.9 kg   SpO2 93%   BMI 21.68 kg/m    Physical Exam  Constitution:  Alert, cooperative, no distress,  Psychiatric: Normal and stable mood and affect, cognition intact,   HEENT: Normocephalic, PERRL, otherwise with in Normal limits  Chest:Chest symmetric Cardio vascular:  S1/S2, RRR, No murmure, No Rubs or Gallops  pulmonary: Clear to auscultation bilaterally, respirations unlabored, negative wheezes / crackles Abdomen: Soft, non-tender, non-distended, bowel sounds,no masses, no organomegaly Muscular  skeletal: Limited exam - in bed, able to move all 4 extremities, Normal strength,  Neuro: CNII-XII intact. , normal motor and sensation, reflexes intact  Extremities: +1,  edema lower extremities, +2 pulses  Skin: Dry, warm to touch, negative for any Rashes, No open wounds Wounds: per nursing documentation  LABs:  CBC Latest Ref Rng & Units 09/15/2018 09/14/2018 08/23/2018  WBC 4.0 - 10.5 K/uL 4.2 3.9(L) 3.7(L)  Hemoglobin 13.0 - 17.0 g/dL 10.7(L) 10.3(L) 11.0(L)  Hematocrit 39.0 - 52.0 % 32.4(L) 32.9(L) 34.3(L)  Platelets 150 - 400 K/uL 211 200 138(L)   CMP Latest Ref Rng & Units 09/14/2018 09/14/2018 08/23/2018  Glucose 70 - 99 mg/dL - 98 108(H)  BUN 8 - 23 mg/dL - 23 20  Creatinine 0.61 - 1.24 mg/dL 1.54(H) 1.59(H) 1.58(H)  Sodium  135 - 145 mmol/L - 139 135  Potassium 3.5 - 5.1 mmol/L - 4.4 4.5  Chloride 98 - 111 mmol/L - 108 100  CO2 22 - 32 mmol/L - 22 26  Calcium 8.9 - 10.3 mg/dL 8.5(L) 8.5(L) 8.6(L)  Total Protein 6.5 - 8.1 g/dL - - 5.7(L)  Total Bilirubin 0.3 - 1.2 mg/dL - - 1.0  Alkaline Phos 38 - 126 U/L - - 64  AST 15 - 41 U/L - - 15  ALT 0 - 44 U/L - - 10

## 2018-09-15 NOTE — Evaluation (Signed)
Clinical/Bedside Swallow Evaluation Patient Details  Name: Micheal Nicholson MRN: [id number] Date of Birth: [birth year]  Today's Date: 09/15/2018 Time: SLP Start Time (ACUTE ONLY): 1008 SLP Stop Time (ACUTE ONLY): 1032 SLP Time Calculation (min) (ACUTE ONLY): 24 min  Past Medical History:  Past Medical History:  Diagnosis Date  . Anxiety   . Arthritis   . BPH (benign prostatic hyperplasia)   . CHF (congestive heart failure) ([place])   . Chronic combined systolic and diastolic CHF, NYHA class 2 ([place]) 04/17/2015  . GERD (gastroesophageal reflux disease)   . Hypertension   . Stroke ([place])   . Symptomatic sinus bradycardia    PPM Dr. Rayann Heman, 01/13/16, STJ device   Past Surgical History:  Past Surgical History:  Procedure Laterality Date  . CARDIAC CATHETERIZATION  2006   Negative / 4 years ago  . EP IMPLANTABLE DEVICE N/A 01/13/2016   Procedure: Pacemaker Implant;  Surgeon: Thompson Grayer, MD;  Location: [facility];  Service: Cardiovascular;  Laterality: N/A;   HPI:  Micheal Nicholson a 84 y.o.malefrom SNF,withmultiple comorbidities, including systolic and diastolic CHF for the last ejection fracture of 30% presented with chest pain shortness of breath,satting in 80s on room air.  Patient's past medical history is consistent with chronic systolic/diastolic congestive heart failure, cardiomyopathy, dysrhythmia on pacemaker, history of stroke and left-sided hemiparesis, GERD, hypertension, anxiety, arthritis, CKD, anemia,ect. Patient complains of right-sided chest pain is more pleuritic, positional. Nonradiating.Pain is reproducible.  Also complaining of some mild shortness of breath, worsening swelling in his legs despite taking his Lasix daily. Also noted that his O2 sat has been dropping to 80s,he is not on his O2 nasal cannula.  Chest xray is showing interstitial and alveolar poulmonary edema slightly increased from prior exam and small right effusion.  He is known to  [facility] with previous clinical swallowing evaluations dated 03/03/2016, 08/26/2016, 07/20/2018.  All recommended a regular diet with thin liquids.  During 07/20/2018 admission he was downgraded to a dysphagia 2 diet due to lethargy.     Assessment / Plan / Recommendation Clinical Impression  Clinical swallowing evaluation was completed using thin liquids via spoon, cup and straw, pureed material and dry solids.  He was alert and able to follow all presented commands.  Oral mechanism exam was completed and remarkable for mildly reduced lingual strength on the left.  All other structures and function apperaed to be adequate.  The patient is known to [facility] service with previous clinical swallowing evaluations.  All exams with a recommendation for a regular diet with thin liquids.  The patient was seen while sitting upright on the side of his bed.  He presented with mild oral dysphagia characterized by delayed oral transit.  This is most likely impacted by missing dentition.  He reported significant issues chewing meats.  Given a graham cracker at the bedside he appeared to be able to efficiently but slowly masticate this material.  No oral residue was seen post swallow.  Significant pharyngeal defiicts were not observed.  No overt s/s of aspiration were seen.  Recommend he continue with a regular diet except chopped meats with thin liquids.  ST follow up is not indicated at this time.  If we can be of further assistance please feel free to reconsult.   SLP Visit Diagnosis: Dysphagia, unspecified (R13.10)    Aspiration Risk  Mild aspiration risk    Diet Recommendation   Regular with chopped meats and thin liquids  Medication Administration: Whole meds with  liquid    Other  Recommendations Oral Care Recommendations: Oral care BID   Follow up Recommendations None        Swallow Study   General Date of Onset: 09/14/18 HPI: Micheal Nicholson a 84 y.o.malefrom SNF,withmultiple comorbidities,  including systolic and diastolic CHF for the last ejection fracture of 30% presented with chest pain shortness of breath,satting in 80s on room air.  Patient's past medical history is consistent with chronic systolic/diastolic congestive heart failure, cardiomyopathy, dysrhythmia on pacemaker, history of stroke and left-sided hemiparesis, GERD, hypertension, anxiety, arthritis, CKD, anemia,ect. Patient complains of right-sided chest pain is more pleuritic, positional. Nonradiating.Pain is reproducible.  Also complaining of some mild shortness of breath, worsening swelling in his legs despite taking his Lasix daily. Also noted that his O2 sat has been dropping to 80s,he is not on his O2 nasal cannula.  Chest xray is showing interstitial and alveolar poulmonary edema slightly increased from prior exam and small right effusion.  He is known to [facility] with previous clinical swallowing evaluations dated 03/03/2016, 08/26/2016, 07/20/2018.  All recommended a regular diet with thin liquids.  During 07/20/2018 admission he was downgraded to a dysphagia 2 diet due to lethargy.   Type of Study: Bedside Swallow Evaluation Previous Swallow Assessment: 03/03/2016, 08/26/2016, 07/20/2018 all with recommendation for a regular diet with thin liquids.   Diet Prior to this Study: Regular;Thin liquids Temperature Spikes Noted: No Respiratory Status: Room air History of Recent Intubation: No Behavior/Cognition: Alert;Cooperative;Pleasant mood Oral Cavity Assessment: Within Functional Limits Oral Care Completed by SLP: No Oral Cavity - Dentition: Missing dentition;Poor condition Vision: Functional for self-feeding Self-Feeding Abilities: Able to feed self Patient Positioning: Other (comment)(Sitting on side of bed.  ) Baseline Vocal Quality: Normal Volitional Cough: Strong Volitional Swallow: Able to elicit    Oral/Motor/Sensory Function Overall Oral Motor/Sensory Function: Within functional limits   Ice  Chips Ice chips: Not tested   Thin Liquid Thin Liquid: Within functional limits Presentation: Cup;Spoon;Straw    Nectar Thick Nectar Thick Liquid: Not tested   Honey Thick Honey Thick Liquid: Not tested   Puree Puree: Within functional limits Presentation: Spoon   Solid     Solid: Impaired Presentation: Self Fed Oral Phase Impairments: Impaired mastication Oral Phase Functional Implications: Prolonged oral transit     Shelly Flatten, MA, CCC-SLP Acute Rehab SLP [phone]  Lamar Sprinkles 09/15/2018,10:45 AM

## 2018-09-15 NOTE — Progress Notes (Signed)
CCMD reports 12 beats of vtach  Paged MD  Pt non symptomatic

## 2018-09-16 DIAGNOSIS — Z23 Encounter for immunization: Secondary | ICD-10-CM | POA: Diagnosis not present

## 2018-09-16 DIAGNOSIS — N183 Chronic kidney disease, stage 3 (moderate): Secondary | ICD-10-CM

## 2018-09-16 DIAGNOSIS — R079 Chest pain, unspecified: Secondary | ICD-10-CM

## 2018-09-16 DIAGNOSIS — I10 Essential (primary) hypertension: Secondary | ICD-10-CM

## 2018-09-16 DIAGNOSIS — I4891 Unspecified atrial fibrillation: Secondary | ICD-10-CM

## 2018-09-16 LAB — BASIC METABOLIC PANEL
ANION GAP: 11 (ref 5–15)
BUN: 23 mg/dL (ref 8–23)
CHLORIDE: 101 mmol/L (ref 98–111)
CO2: 28 mmol/L (ref 22–32)
Calcium: 9.1 mg/dL (ref 8.9–10.3)
Creatinine, Ser: 1.77 mg/dL — ABNORMAL HIGH (ref 0.61–1.24)
GFR calc Af Amer: 36 mL/min — ABNORMAL LOW (ref >60)
GFR, EST NON AFRICAN AMERICAN: 31 mL/min — AB (ref >60)
Glucose, Bld: 105 mg/dL — ABNORMAL HIGH (ref 70–99)
POTASSIUM: 4 mmol/L (ref 3.5–5.1)
SODIUM: 140 mmol/L (ref 135–145)

## 2018-09-16 LAB — BRAIN NATRIURETIC PEPTIDE: B Natriuretic Peptide: 722.7 pg/mL — ABNORMAL HIGH (ref 0.0–100.0)

## 2018-09-16 LAB — CBC
HCT: 35.3 % — ABNORMAL LOW (ref 39.0–52.0)
Hemoglobin: 11.5 g/dL — ABNORMAL LOW (ref 13.0–17.0)
MCH: 29.3 pg (ref 26.0–34.0)
MCHC: 32.6 g/dL (ref 30.0–36.0)
MCV: 90.1 fL (ref 78.0–100.0)
Platelets: 230 10*3/uL (ref 150–400)
RBC: 3.92 MIL/uL — AB (ref 4.22–5.81)
RDW: 14.8 % (ref 11.5–15.5)
WBC: 4.2 10*3/uL (ref 4.0–10.5)

## 2018-09-16 LAB — GLUCOSE, CAPILLARY: GLUCOSE-CAPILLARY: 91 mg/dL (ref 70–99)

## 2018-09-16 NOTE — Care Management Note (Signed)
Case Management Note  Patient Details  Name: Micheal Nicholson MRN: [id number] Date of Birth: 1920-08-26  Subjective/Objective:    Acute on Chronic CHF, lives at home with son                Action/Plan: Spoke to pt and active with [facility]. Contacted [facility] to make aware of BMP needed on Wed, 9/25 and faxed to PCP, Dr Jarold Song. Pt has RW, and cane at home.   Expected Discharge Date:  09/16/18               Expected Discharge Plan:  [place]  In-House Referral:  NA  Discharge planning Services  CM Consult  Post Acute Care Choice:  Home Health Choice offered to:  Patient  DME Arranged:  N/A DME Agency:  NA  HH Arranged:  RN, PT, OT [place] Agency:  [facility] (formerly [employer])  Status of Service:  Completed, signed off  If discussed at [employer], dates discussed:    Additional Comments:  Erenest Rasher, RN 09/16/2018, 12:30 PM

## 2018-09-16 NOTE — Progress Notes (Signed)
RT NOTES: Assessed patient on morning rounds. Patient sitting on side of bed on room air. Sats 90%. BBS clear/diminished to auscultation. No distress noted. Will continue to monitor.

## 2018-09-16 NOTE — Care Management Important Message (Signed)
Important Message  Patient Details  Name: Micheal Nicholson MRN: [id number] Date of Birth: 01-Jun-1920   Medicare Important Message Given:  Yes    Erenest Rasher, RN 09/16/2018, 12:19 PM

## 2018-09-16 NOTE — Discharge Summary (Signed)
Physician Discharge Summary  Micheal Nicholson SWF:[id number] DOB: August 15, 1920 DOA: 09/14/2018  PCP: Micheal Post, MD  Admit date: 09/14/2018 Discharge date: 09/16/2018  Admitted From: Home  Disposition:  Home   Recommendations for Outpatient Follow-up:  1. Follow up with PCP in 1 week 2. Please obtain BMP in 2 days and fax to Micheal Nicholson 3. Resume lisinopril when creatinine normalizes 4. Resume Lasix/K if creatinine stable 5. Resume carvedilol if BP elevated   Home Health: Yes  Equipment/Devices: None  Discharge Condition: Fair  CODE STATUS: FULL Diet recommendation: Cardiac  Brief/Interim Summary: Micheal Nicholson is a 84 y.o. M with systolic and diastolic CHF, EF 22-02%, SSS with pacer, hx stroke, HTN, CKD baseline Cr .1.5 and anemia who presented with chest pain.  Found to have elevated BNP.     Discharge Diagnoses:   Chest pain The patient's pain was reproducible with palpation.  ACS was ruled out.  He had serially negative troponins, normal ECG, no ectopy on telemetry.  He was asymptomatic with ambulation.  Echocardiogram was obtained, showed no RWMA, just globally reduced EF, similar to previous, slightly lower at 20%.  Acute on chronic systolic and diastolic CHF Started on IV Lasix.  Echo obtained, showed EF 20%, similar to previous.  Today, no dyspnea, no orthopnea, no leg swelling, no chest pain.  Able to ambulate without dyspnea.  Slight creatinine bump.  Will stop diuretics, discharge with close follow up renal function.  Chronic kidney disease stage III Baseline Cr 1.4-1.5.  Creatinine rose slightly with IV diuresis.  Lasix held at discharge and lisinopril.  Will get repeat Cr in 2 days, restart Lasix and ACEi pending repeat.     Discharge Instructions  Discharge Instructions    Diet - low sodium heart healthy   Complete by:  As directed    Discharge instructions   Complete by:  As directed    From Drs. Micheal Nicholson and Micheal Nicholson: You were admitted for  chest pain. Your lab testing and exam suggested that you were having an episode of congestive heart failure.  Micheal Nicholson treated you with IV Lasix and your symptoms improved.  Your kidney function worsened a little today.  This was minor and I suspect will improve in the next couple days.  For now: Do not take your Lasix and potassium. Also, do not take your carvedilol or lisinopril.  Have the home health nurse check your blood pressure. If your blood pressure is OVER 130/90, restart the carvedilol.  Call Micheal Nicholson office for a follow up appointment this week. Have him check your kidney function. If your kidney function is back to normal, restart your Lasix, potassium. Ask Micheal Nicholson what to do about your lisinopril.    Have   Increase activity slowly   Complete by:  As directed      Allergies as of 09/16/2018      Reactions   Chocolate Other (See Comments)   Acid reflux       Medication List    STOP taking these medications   furosemide 40 MG tablet Commonly known as:  LASIX   lisinopril 2.5 MG tablet Commonly known as:  PRINIVIL,ZESTRIL   potassium chloride 10 MEQ tablet Commonly known as:  K-DUR     TAKE these medications   ALPRAZolam 0.5 MG tablet Commonly known as:  XANAX TAKE 1 TABLET BY MOUTH TWICE A DAY AS NEEDED FOR ANXIETY What changed:    how much to take  how to take this  when to take this  additional instructions   carvedilol 6.25 MG tablet Commonly known as:  COREG Take 3.125 mg by mouth 2 (two) times daily with a meal. What changed:  Another medication with the same name was removed. Continue taking this medication, and follow the directions you see here.   docusate sodium 100 MG capsule Commonly known as:  COLACE Take 100 mg by mouth 2 (two) times daily.   fluticasone 50 MCG/ACT nasal spray Commonly known as:  FLONASE Place 2 sprays into both nostrils 2 (two) times daily as needed for rhinitis.   omeprazole 20 MG  capsule Commonly known as:  PRILOSEC TAKE 1 CAPSULE (20 MG TOTAL) BY MOUTH DAILY AS NEEDED (FOR ACID REFLEX).   pramipexole 0.125 MG tablet Commonly known as:  MIRAPEX TAKE 2 TABLETS BY MOUTH AT NIGHT FOR RESTLESS LEGS What changed:  See the new instructions.   sertraline 100 MG tablet Commonly known as:  ZOLOFT TAKE 1/2 TABLET DAILY NEEDS OFFICE VISIT What changed:    how much to take  how to take this  when to take this  additional instructions   tamsulosin 0.4 MG Caps capsule Commonly known as:  FLOMAX Take 0.4 mg by mouth daily.   traMADol 50 MG tablet Commonly known as:  ULTRAM Take 50 mg by mouth every 6 (six) hours as needed (pain).      Follow-up Information    Micheal Post, MD. Go on 09/17/2018.   Specialty:  Family Medicine Why:  @10 :Max Sane information: [place] [phone] [phone]        Home[facility] Follow up.   Specialty:  Home Health Services Why:  Home Health RN, Physical Therapy and Occupational Therapy- agency will call to arrange initial visit Contact information: [place] [address] [phone]          Allergies  Allergen Reactions  . Chocolate Other (See Comments)    Acid reflux     Consultations:  None   Procedures/Studies: Dg Chest 2 View  Result Date: 09/14/2018 CLINICAL DATA:  84 y/o  M; sensation of pacemaker having moved. EXAM: CHEST - 2 VIEW COMPARISON:  07/19/2018 chest radiograph. FINDINGS: Stable cardiomegaly given projection and technique. Single lead pacemaker projecting over left upper chest. No significant interval rotation of the pacemaking device. Aortic atherosclerosis with calcification. Diffuse reticular and patchy basilar opacities probably representing interstitial and alveolar pulmonary edema. Small right pleural effusion. Moderate levocurvature of the thoracolumbar spine. IMPRESSION: 1. Interstitial and alveolar pulmonary edema  increased from prior chest radiograph. Small right effusion. 2. Pacemaking device projects over left upper chest, no significant interval rotation in comparison with prior study. 3. Stable cardiomegaly. 4.  Aortic Atherosclerosis (ICD10-I70.0). Electronically Signed   By: Kristine Garbe M.D.   On: 09/14/2018 06:29   Ct Abdomen Pelvis W Contrast  Result Date: 08/24/2018 CLINICAL DATA:  LEFT lower quadrant pain since yesterday. EXAM: CT ABDOMEN AND PELVIS WITH CONTRAST TECHNIQUE: Multidetector CT imaging of the abdomen and pelvis was performed using the standard protocol following bolus administration of intravenous contrast. CONTRAST:  162mL ISOVUE-370 IOPAMIDOL (ISOVUE-370) INJECTION 76% COMPARISON:  None. FINDINGS: LOWER CHEST: Cardiomegaly. Mild coronary artery calcifications. No pericardial effusion. Pacemaker wires in place. Patchy ground-glass opacities RIGHT lower lobe with mild fibrotic changes in lung bases with pleural thickening. Subacute LEFT ninth rib fracture. Acute to early subacute RIGHT eighth rib fracture. HEPATOBILIARY: Normal liver. Subcentimeter gallstones without CT findings of acute  cholecystitis. PANCREAS: Normal. SPLEEN: Small volume splenic subcapsular free fluid. Heterogeneous spleen on initial phase with faint potential mass measuring to 15 mm. ADRENALS/URINARY TRACT: Kidneys are orthotopic, demonstrating symmetric enhancement. No nephrolithiasis, hydronephrosis or solid renal masses. Homogeneously hypodense benign-appearing 3.2 cm cyst upper pole RIGHT kidney. 2.1 cm cyst LEFT upper pole. The unopacified ureters are normal in course and caliber. Delayed imaging through the kidneys demonstrates symmetric prompt contrast excretion within the proximal urinary collecting system. Urinary bladder is well distended and unremarkable. Normal adrenal glands. STOMACH/BOWEL: The stomach, small and large bowel are normal in course and caliber without inflammatory changes, sensitivity  decreased without oral contrast. Moderate volume retained large bowel stool. Small amount of small bowel feces compatible with chronic stasis. Normal appendix. VASCULAR/LYMPHATIC: Aortoiliac vessels are normal in caliber, mildly tortuous course. Moderate calcific atherosclerosis. No lymphadenopathy by CT size criteria. REPRODUCTIVE: Prostatomegaly invading base of bladder. OTHER: No intraperitoneal free fluid or free air. Minimal retroperitoneal fat stranding and effusion without discrete fluid collection. MUSCULOSKELETAL: Nonacute. Osteopenia. Severe degenerative changes spine, scoliosis. No CT findings of discitis osteomyelitis. IMPRESSION: 1. Small volume perisplenic subcapsular free fluid with potential small splenic masses. MRI with contrast would be more definitive though, ultrasound would be more expedient. 2. Mild nonspecific retroperitoneal fat stranding and trace effusion. 3. Moderate volume retained large bowel stool without bowel obstruction. 4. Faint ground-glass opacities RIGHT lung base may be infectious or inflammatory. Acute to subacute RIGHT eighth rib fracture. Aortic Atherosclerosis (ICD10-I70.0). Electronically Signed   By: Elon Alas M.D.   On: 08/24/2018 01:22   US Spleen (abdomen Limited)  Result Date: 08/24/2018 CLINICAL DATA:  Evaluate for splenic mass. EXAM: ULTRASOUND ABDOMEN LIMITED COMPARISON:  CT abdomen pelvis 08/24/2018 FINDINGS: The spleen measures 8.8 cm in greatest dimension. No definite mass identified on current exam. IMPRESSION: No definite splenic mass identified on ultrasound. Electronically Signed   By: Lovey Newcomer M.D.   On: 08/24/2018 02:15   Echocardiogram Study Conclusions  - Left ventricle: Poor acoustic windows limit study. LVEF is   severely depressed at approximately 20% with diffuse hypokinesis;   inferior akinesis. The cavity size was mildly dilated. Wall   thickness was normal. - Aortic valve: There was mild regurgitation. - Mitral valve:  There was mild regurgitation. - Left atrium: The atrium was severely dilated. - Right ventricle: The cavity size was mildly dilated. Systolic   function was moderately reduced. - Right atrium: The atrium was mildly dilated. - Tricuspid valve: There was moderate regurgitation. - Pulmonary arteries: PA peak pressure: 49 mm Hg (S).  Impressions:  - Frequent ectopy during study Exam done with pt in multiple   positions.     Subjective: Feels well.  Ambulating without symptoms.  No more chest pain.  No orthopnea or swelling.  No confusion.  No weakness.    Discharge Exam: Vitals:   09/16/18 0933 09/16/18 0935  BP: 93/62 96/63  Pulse: 62 63  Resp: 16   Temp: 97.6 F (36.4 C)   SpO2: 100% 100%   Vitals:   09/16/18 0647 09/16/18 0842 09/16/18 0933 09/16/18 0935  BP: 129/63  93/62 96/63  Pulse: 63  62 63  Resp: 16  16   Temp: 97.8 F (36.6 C)  97.6 F (36.4 C)   TempSrc: Oral  Oral   SpO2: 98% 90% 100% 100%  Weight: 60.1 kg     Height:        General: Pt is alert, awake, not in acute distress, sitting on edge  of bed Cardiovascular: RRR, S1/S2 +, no rubs, no gallops, JVP normal, no LE edema Respiratory: CTA bilaterally, no wheezing, no rhonchi Abdominal: Soft, NT, ND, bowel sounds + Extremities: no edema, no cyanosis    The results of significant diagnostics from this hospitalization (including imaging, microbiology, ancillary and laboratory) are listed below for reference.     Microbiology: Recent Results (from the past 240 hour(s))  MRSA PCR Screening     Status: Abnormal   Collection Time: 09/14/18  8:37 PM  Result Value Ref Range Status   MRSA by PCR POSITIVE (A) NEGATIVE Final    Comment:        The GeneXpert MRSA Assay (FDA approved for NASAL specimens only), is one component of a comprehensive MRSA colonization surveillance program. It is not intended to diagnose MRSA infection nor to guide or monitor treatment for MRSA infections. RESULT CALLED  TO, READ BACK BY AND VERIFIED WITH: RN Trinda Pascal [phone] MLM Performed at [facility], [address], [place], [place] [address]      Labs: BNP (last 3 results) Recent Labs    09/14/18 0741 09/15/18 0504 09/16/18 0744  BNP 1,048.1* 1,147.5* 233.0*   Basic Metabolic Panel: Recent Labs  Lab 09/14/18 0741 09/14/18 1043 09/16/18 0744  NA 139  --  140  K 4.4  --  4.0  CL 108  --  101  CO2 22  --  28  GLUCOSE 98  --  105*  BUN 23  --  23  CREATININE 1.59* 1.54* 1.77*  CALCIUM 8.5* 8.5* 9.1  MG  --  2.3  --   PHOS  --  3.7  --    Liver Function Tests: No results for input(s): AST, ALT, ALKPHOS, BILITOT, PROT, ALBUMIN in the last 168 hours. No results for input(s): LIPASE, AMYLASE in the last 168 hours. No results for input(s): AMMONIA in the last 168 hours. CBC: Recent Labs  Lab 09/14/18 0741 09/15/18 0504 09/16/18 0744  WBC 3.9* 4.2 4.2  NEUTROABS 2.7  --   --   HGB 10.3* 10.7* 11.5*  HCT 32.9* 32.4* 35.3*  MCV 92.4 90.5 90.1  PLT 200 211 230   Cardiac Enzymes: Recent Labs  Lab 09/14/18 1043 09/14/18 1526 09/14/18 2122  TROPONINI <0.03 <0.03 <0.03   BNP: Invalid input(s): POCBNP CBG: Recent Labs  Lab 09/15/18 0803 09/16/18 0749  GLUCAP 92 91   D-Dimer No results for input(s): DDIMER in the last 72 hours. Hgb A1c Recent Labs    09/14/18 1043  HGBA1C 5.8*   Lipid Profile No results for input(s): CHOL, HDL, LDLCALC, TRIG, CHOLHDL, LDLDIRECT in the last 72 hours. Thyroid function studies No results for input(s): TSH, T4TOTAL, T3FREE, THYROIDAB in the last 72 hours.  Invalid input(s): FREET3 Anemia work up No results for input(s): VITAMINB12, FOLATE, FERRITIN, TIBC, IRON, RETICCTPCT in the last 72 hours. Urinalysis    Component Value Date/Time   COLORURINE STRAW (A) 09/14/2018 1142   APPEARANCEUR CLEAR 09/14/2018 1142   LABSPEC 1.005 09/14/2018 1142   PHURINE 7.0 09/14/2018 1142   GLUCOSEU NEGATIVE 09/14/2018 1142   HGBUR  NEGATIVE 09/14/2018 1142   BILIRUBINUR NEGATIVE 09/14/2018 1142   BILIRUBINUR negative 11/10/2013 1713   KETONESUR NEGATIVE 09/14/2018 1142   PROTEINUR NEGATIVE 09/14/2018 1142   UROBILINOGEN 1.0 03/14/2015 1608   NITRITE NEGATIVE 09/14/2018 1142   LEUKOCYTESUR NEGATIVE 09/14/2018 1142   Sepsis Labs Invalid input(s): PROCALCITONIN,  WBC,  LACTICIDVEN Microbiology Recent Results (from the past 240  hour(s))  MRSA PCR Screening     Status: Abnormal   Collection Time: 09/14/18  8:37 PM  Result Value Ref Range Status   MRSA by PCR POSITIVE (A) NEGATIVE Final    Comment:        The GeneXpert MRSA Assay (FDA approved for NASAL specimens only), is one component of a comprehensive MRSA colonization surveillance program. It is not intended to diagnose MRSA infection nor to guide or monitor treatment for MRSA infections. RESULT CALLED TO, READ BACK BY AND VERIFIED WITH: RN Trinda Pascal [phone] MLM Performed at [facility], [address] [place], [place] [address]      Time coordinating discharge: 25 minutes       SIGNED:   Edwin Dada, MD  [facility] 09/16/2018, 7:34 PM

## 2018-09-17 ENCOUNTER — Inpatient Hospital Stay: Payer: Medicare HMO | Admitting: Family Medicine

## 2018-09-17 ENCOUNTER — Telehealth: Payer: Self-pay | Admitting: Family Medicine

## 2018-09-17 NOTE — Telephone Encounter (Signed)
Unable to reach patient at time of TCM Call. Left message for patient to return call when available.  

## 2018-09-18 ENCOUNTER — Telehealth: Payer: Self-pay | Admitting: Family Medicine

## 2018-09-18 DIAGNOSIS — I480 Paroxysmal atrial fibrillation: Secondary | ICD-10-CM | POA: Diagnosis not present

## 2018-09-18 DIAGNOSIS — I69318 Other symptoms and signs involving cognitive functions following cerebral infarction: Secondary | ICD-10-CM | POA: Diagnosis not present

## 2018-09-18 DIAGNOSIS — F0150 Vascular dementia without behavioral disturbance: Secondary | ICD-10-CM | POA: Diagnosis not present

## 2018-09-18 DIAGNOSIS — I69351 Hemiplegia and hemiparesis following cerebral infarction affecting right dominant side: Secondary | ICD-10-CM | POA: Diagnosis not present

## 2018-09-18 DIAGNOSIS — I130 Hypertensive heart and chronic kidney disease with heart failure and stage 1 through stage 4 chronic kidney disease, or unspecified chronic kidney disease: Secondary | ICD-10-CM | POA: Diagnosis not present

## 2018-09-18 DIAGNOSIS — N183 Chronic kidney disease, stage 3 (moderate): Secondary | ICD-10-CM | POA: Diagnosis not present

## 2018-09-18 DIAGNOSIS — I69354 Hemiplegia and hemiparesis following cerebral infarction affecting left non-dominant side: Secondary | ICD-10-CM | POA: Diagnosis not present

## 2018-09-18 DIAGNOSIS — D631 Anemia in chronic kidney disease: Secondary | ICD-10-CM | POA: Diagnosis not present

## 2018-09-18 DIAGNOSIS — I5043 Acute on chronic combined systolic (congestive) and diastolic (congestive) heart failure: Secondary | ICD-10-CM | POA: Diagnosis not present

## 2018-09-18 NOTE — Telephone Encounter (Signed)
Unable to reach patient at time of TCM Call. Left message for patient to return call when available.  

## 2018-09-18 NOTE — Telephone Encounter (Signed)
Called number and spoke to [place] with the after hours phone center and gave her the OK per Dr. Elease Hashimoto for lab to draw the [facility] tomorrow for the patient.

## 2018-09-18 NOTE — Telephone Encounter (Signed)
Copied from [place] [id number]. Topic: General - Other >> Sep 18, 2018  4:07 PM Judyann Munson wrote: Reason for CRM:  Melissa- [facility]  Is calling to advise they forgot to draw labs for the patient  BMP,  And Lenna Sciara is requesting to draw the BMP tomorrow.   Best contact number is [phone]

## 2018-09-18 NOTE — Telephone Encounter (Signed)
OK 

## 2018-09-18 NOTE — Telephone Encounter (Signed)
Please advise 

## 2018-09-19 ENCOUNTER — Telehealth: Payer: Self-pay | Admitting: Family Medicine

## 2018-09-19 DIAGNOSIS — I130 Hypertensive heart and chronic kidney disease with heart failure and stage 1 through stage 4 chronic kidney disease, or unspecified chronic kidney disease: Secondary | ICD-10-CM | POA: Diagnosis not present

## 2018-09-19 DIAGNOSIS — F0150 Vascular dementia without behavioral disturbance: Secondary | ICD-10-CM | POA: Diagnosis not present

## 2018-09-19 DIAGNOSIS — I480 Paroxysmal atrial fibrillation: Secondary | ICD-10-CM | POA: Diagnosis not present

## 2018-09-19 DIAGNOSIS — R079 Chest pain, unspecified: Secondary | ICD-10-CM | POA: Diagnosis not present

## 2018-09-19 DIAGNOSIS — I69351 Hemiplegia and hemiparesis following cerebral infarction affecting right dominant side: Secondary | ICD-10-CM | POA: Diagnosis not present

## 2018-09-19 DIAGNOSIS — D631 Anemia in chronic kidney disease: Secondary | ICD-10-CM | POA: Diagnosis not present

## 2018-09-19 DIAGNOSIS — N183 Chronic kidney disease, stage 3 (moderate): Secondary | ICD-10-CM | POA: Diagnosis not present

## 2018-09-19 DIAGNOSIS — I5043 Acute on chronic combined systolic (congestive) and diastolic (congestive) heart failure: Secondary | ICD-10-CM | POA: Diagnosis not present

## 2018-09-19 DIAGNOSIS — I69354 Hemiplegia and hemiparesis following cerebral infarction affecting left non-dominant side: Secondary | ICD-10-CM | POA: Diagnosis not present

## 2018-09-19 DIAGNOSIS — R0602 Shortness of breath: Secondary | ICD-10-CM | POA: Diagnosis not present

## 2018-09-19 DIAGNOSIS — I69318 Other symptoms and signs involving cognitive functions following cerebral infarction: Secondary | ICD-10-CM | POA: Diagnosis not present

## 2018-09-19 NOTE — Telephone Encounter (Signed)
Copied from [place] [phone]. Topic: Quick Communication - See Telephone Encounter >> Sep 19, 2018  5:11 PM Blase Mess A wrote: CRM for notification. See Telephone encounter for: 09/19/18.Estill Bamberg from [facility] is requesting resumption of home health orders for disease and med management for 4weeks  Call back number [phone]

## 2018-09-20 DIAGNOSIS — D631 Anemia in chronic kidney disease: Secondary | ICD-10-CM | POA: Diagnosis not present

## 2018-09-20 DIAGNOSIS — F0150 Vascular dementia without behavioral disturbance: Secondary | ICD-10-CM | POA: Diagnosis not present

## 2018-09-20 DIAGNOSIS — I69318 Other symptoms and signs involving cognitive functions following cerebral infarction: Secondary | ICD-10-CM | POA: Diagnosis not present

## 2018-09-20 DIAGNOSIS — I69351 Hemiplegia and hemiparesis following cerebral infarction affecting right dominant side: Secondary | ICD-10-CM | POA: Diagnosis not present

## 2018-09-20 DIAGNOSIS — I130 Hypertensive heart and chronic kidney disease with heart failure and stage 1 through stage 4 chronic kidney disease, or unspecified chronic kidney disease: Secondary | ICD-10-CM | POA: Diagnosis not present

## 2018-09-20 DIAGNOSIS — I480 Paroxysmal atrial fibrillation: Secondary | ICD-10-CM | POA: Diagnosis not present

## 2018-09-20 DIAGNOSIS — I5043 Acute on chronic combined systolic (congestive) and diastolic (congestive) heart failure: Secondary | ICD-10-CM | POA: Diagnosis not present

## 2018-09-20 DIAGNOSIS — N183 Chronic kidney disease, stage 3 (moderate): Secondary | ICD-10-CM | POA: Diagnosis not present

## 2018-09-20 DIAGNOSIS — I69354 Hemiplegia and hemiparesis following cerebral infarction affecting left non-dominant side: Secondary | ICD-10-CM | POA: Diagnosis not present

## 2018-09-20 NOTE — Telephone Encounter (Signed)
Called and spoke to [place] and gave her the OK per Dr. Elease Hashimoto for verbal orders. Estill Bamberg verbalized an agreement.

## 2018-09-20 NOTE — Telephone Encounter (Signed)
ok 

## 2018-09-20 NOTE — Telephone Encounter (Signed)
Please advise 

## 2018-09-23 ENCOUNTER — Telehealth: Payer: Self-pay | Admitting: Family Medicine

## 2018-09-23 ENCOUNTER — Telehealth: Payer: Self-pay | Admitting: *Deleted

## 2018-09-23 ENCOUNTER — Emergency Department (HOSPITAL_COMMUNITY): Payer: Medicare HMO

## 2018-09-23 ENCOUNTER — Inpatient Hospital Stay (HOSPITAL_COMMUNITY)
Admission: EM | Admit: 2018-09-23 | Discharge: 2018-09-25 | DRG: 291 | Disposition: A | Discharge status: Home Health | Payer: Medicare HMO | Source: Ambulatory Visit | Attending: Internal Medicine | Admitting: Internal Medicine

## 2018-09-23 ENCOUNTER — Encounter (HOSPITAL_COMMUNITY): Payer: Self-pay | Admitting: Emergency Medicine

## 2018-09-23 ENCOUNTER — Other Ambulatory Visit: Payer: Self-pay

## 2018-09-23 DIAGNOSIS — G9341 Metabolic encephalopathy: Secondary | ICD-10-CM | POA: Diagnosis not present

## 2018-09-23 DIAGNOSIS — N400 Enlarged prostate without lower urinary tract symptoms: Secondary | ICD-10-CM | POA: Diagnosis present

## 2018-09-23 DIAGNOSIS — T17908D Unspecified foreign body in respiratory tract, part unspecified causing other injury, subsequent encounter: Secondary | ICD-10-CM | POA: Diagnosis not present

## 2018-09-23 DIAGNOSIS — R627 Adult failure to thrive: Secondary | ICD-10-CM | POA: Diagnosis present

## 2018-09-23 DIAGNOSIS — Z801 Family history of malignant neoplasm of trachea, bronchus and lung: Secondary | ICD-10-CM | POA: Diagnosis not present

## 2018-09-23 DIAGNOSIS — I10 Essential (primary) hypertension: Secondary | ICD-10-CM | POA: Diagnosis not present

## 2018-09-23 DIAGNOSIS — J690 Pneumonitis due to inhalation of food and vomit: Secondary | ICD-10-CM | POA: Diagnosis not present

## 2018-09-23 DIAGNOSIS — Z8673 Personal history of transient ischemic attack (TIA), and cerebral infarction without residual deficits: Secondary | ICD-10-CM | POA: Diagnosis not present

## 2018-09-23 DIAGNOSIS — Z8249 Family history of ischemic heart disease and other diseases of the circulatory system: Secondary | ICD-10-CM

## 2018-09-23 DIAGNOSIS — R05 Cough: Secondary | ICD-10-CM | POA: Diagnosis not present

## 2018-09-23 DIAGNOSIS — J189 Pneumonia, unspecified organism: Secondary | ICD-10-CM | POA: Diagnosis not present

## 2018-09-23 DIAGNOSIS — I130 Hypertensive heart and chronic kidney disease with heart failure and stage 1 through stage 4 chronic kidney disease, or unspecified chronic kidney disease: Secondary | ICD-10-CM | POA: Diagnosis not present

## 2018-09-23 DIAGNOSIS — T17908A Unspecified foreign body in respiratory tract, part unspecified causing other injury, initial encounter: Secondary | ICD-10-CM | POA: Diagnosis present

## 2018-09-23 DIAGNOSIS — I480 Paroxysmal atrial fibrillation: Secondary | ICD-10-CM | POA: Diagnosis present

## 2018-09-23 DIAGNOSIS — J811 Chronic pulmonary edema: Secondary | ICD-10-CM | POA: Diagnosis not present

## 2018-09-23 DIAGNOSIS — R1313 Dysphagia, pharyngeal phase: Secondary | ICD-10-CM | POA: Diagnosis present

## 2018-09-23 DIAGNOSIS — R7989 Other specified abnormal findings of blood chemistry: Secondary | ICD-10-CM | POA: Diagnosis not present

## 2018-09-23 DIAGNOSIS — Z800 Family history of malignant neoplasm of digestive organs: Secondary | ICD-10-CM

## 2018-09-23 DIAGNOSIS — D649 Anemia, unspecified: Secondary | ICD-10-CM | POA: Diagnosis present

## 2018-09-23 DIAGNOSIS — I69354 Hemiplegia and hemiparesis following cerebral infarction affecting left non-dominant side: Secondary | ICD-10-CM

## 2018-09-23 DIAGNOSIS — N183 Chronic kidney disease, stage 3 unspecified: Secondary | ICD-10-CM | POA: Diagnosis present

## 2018-09-23 DIAGNOSIS — K219 Gastro-esophageal reflux disease without esophagitis: Secondary | ICD-10-CM | POA: Diagnosis present

## 2018-09-23 DIAGNOSIS — I5023 Acute on chronic systolic (congestive) heart failure: Secondary | ICD-10-CM | POA: Diagnosis present

## 2018-09-23 DIAGNOSIS — F411 Generalized anxiety disorder: Secondary | ICD-10-CM | POA: Diagnosis not present

## 2018-09-23 DIAGNOSIS — Z87891 Personal history of nicotine dependence: Secondary | ICD-10-CM | POA: Diagnosis not present

## 2018-09-23 DIAGNOSIS — R4182 Altered mental status, unspecified: Secondary | ICD-10-CM | POA: Diagnosis not present

## 2018-09-23 LAB — CBC WITH DIFFERENTIAL/PLATELET
Abs Immature Granulocytes: 0 10*3/uL (ref 0.0–0.1)
BASOS ABS: 0 10*3/uL (ref 0.0–0.1)
Basophils Relative: 0 %
Eosinophils Absolute: 0.1 10*3/uL (ref 0.0–0.7)
Eosinophils Relative: 2 %
HCT: 34.1 % — ABNORMAL LOW (ref 39.0–52.0)
HEMOGLOBIN: 10.7 g/dL — AB (ref 13.0–17.0)
Immature Granulocytes: 0 %
LYMPHS ABS: 0.6 10*3/uL — AB (ref 0.7–4.0)
Lymphocytes Relative: 12 %
MCH: 29.5 pg (ref 26.0–34.0)
MCHC: 31.4 g/dL (ref 30.0–36.0)
MCV: 93.9 fL (ref 78.0–100.0)
Monocytes Absolute: 0.4 10*3/uL (ref 0.1–1.0)
Monocytes Relative: 9 %
Neutro Abs: 3.7 10*3/uL (ref 1.7–7.7)
Neutrophils Relative %: 77 %
Platelets: 213 10*3/uL (ref 150–400)
RBC: 3.63 MIL/uL — ABNORMAL LOW (ref 4.22–5.81)
RDW: 14.7 % (ref 11.5–15.5)
WBC: 4.8 10*3/uL (ref 4.0–10.5)

## 2018-09-23 LAB — COMPREHENSIVE METABOLIC PANEL
ALT: 15 U/L (ref 0–44)
AST: 28 U/L (ref 15–41)
Albumin: 3.5 g/dL (ref 3.5–5.0)
Alkaline Phosphatase: 63 U/L (ref 38–126)
Anion gap: 13 (ref 5–15)
BUN: 21 mg/dL (ref 8–23)
CO2: 23 mmol/L (ref 22–32)
Calcium: 9.2 mg/dL (ref 8.9–10.3)
Chloride: 105 mmol/L (ref 98–111)
Creatinine, Ser: 1.46 mg/dL — ABNORMAL HIGH (ref 0.61–1.24)
GFR calc Af Amer: 45 mL/min — ABNORMAL LOW (ref >60)
GFR, EST NON AFRICAN AMERICAN: 39 mL/min — AB (ref >60)
Glucose, Bld: 105 mg/dL — ABNORMAL HIGH (ref 70–99)
Potassium: 4.7 mmol/L (ref 3.5–5.1)
Sodium: 141 mmol/L (ref 135–145)
TOTAL PROTEIN: 6.4 g/dL — AB (ref 6.5–8.1)
Total Bilirubin: 1.5 mg/dL — ABNORMAL HIGH (ref 0.3–1.2)

## 2018-09-23 LAB — BRAIN NATRIURETIC PEPTIDE: B NATRIURETIC PEPTIDE 5: 803 pg/mL — AB (ref 0.0–100.0)

## 2018-09-23 LAB — D-DIMER, QUANTITATIVE: D-Dimer, Quant: 1.36 ug/mL-FEU — ABNORMAL HIGH (ref 0.00–0.50)

## 2018-09-23 LAB — I-STAT TROPONIN, ED: Troponin i, poc: 0.01 ng/mL (ref 0.00–0.08)

## 2018-09-23 MED ORDER — IOPAMIDOL (ISOVUE-370) INJECTION 76%
80.0000 mL | Freq: Once | INTRAVENOUS | Status: AC | PRN
  Administered 2018-09-23: 100 mL via INTRAVENOUS

## 2018-09-23 MED ORDER — IOPAMIDOL (ISOVUE-370) INJECTION 76%
INTRAVENOUS | Status: AC
  Filled 2018-09-23: qty 100

## 2018-09-23 NOTE — Telephone Encounter (Signed)
Called Micheal Nicholson and gave her the OK per Dr. Elease Hashimoto. Micheal Nicholson verbalized an understanding.

## 2018-09-23 NOTE — ED Provider Notes (Signed)
[facility] EMERGENCY DEPARTMENT Provider Note   CSN: [id number] Arrival date & time: 09/23/18  1552     History   Chief Complaint Chief Complaint  Patient presents with  . Cough  . Altered Mental Status    HPI Micheal Nicholson is a 84 y.o. male.  The history is provided by the patient and a relative. No language interpreter was used.  Cough   Altered Mental Status     Micheal Nicholson is a 84 y.o. male who presents to the Emergency Department complaining of confusion, cough. The five caveat due to confusion. History is provided by the patient's son. He is brought into the emergency department today for evaluation of confusion and altered mental status that darted when home health nurse checked on him today. He did have poor sleep last night and took the tramadol today for right knee pain. When nursing arrived they noted that he was more confused than normal as well as more drowsy. He was complaining of severe pain in the right knee as well as the left side of his abdomen. He has recently been admitted to the hospital for cough. He lives at home alone. He has home health nursing four hours daily. He denies any headache, chest pain, shortness of breath, abdominal pain, nausea, vomiting, dysuria. He does have pain in his right knee on standing. His abdominal pain from earlier today is now resolved. Patient denies cough but family report coughing. Past Medical History:  Diagnosis Date  . Anxiety   . Arthritis   . BPH (benign prostatic hyperplasia)   . CHF (congestive heart failure) ([place])   . Chronic combined systolic and diastolic CHF, NYHA class 2 ([place]) 04/17/2015  . GERD (gastroesophageal reflux disease)   . Hypertension   . Stroke ([place])   . Symptomatic sinus bradycardia    PPM Dr. Rayann Heman, 01/13/16, STJ device    Patient Active Problem List   Diagnosis Date Noted  . CHF (congestive heart failure), NYHA class III, acute on chronic, combined ([place])  09/14/2018  . SOB (shortness of breath) 09/14/2018  . Acute on chronic combined systolic and diastolic CHF (congestive heart failure) ([place]) 09/14/2018  . Chest pain in adult 09/14/2018  . At high risk for falls 03/26/2017  . Acute on chronic systolic CHF (congestive heart failure) ([place]) 10/08/2016  . Acute respiratory failure with hypoxia ([place]) 10/08/2016  . CKD (chronic kidney disease), stage III ([place]) 10/08/2016  . Sacral fracture, closed ([place]) 10/08/2016  . Hypokalemia 08/24/2016  . Community acquired pneumonia 08/23/2016  . Pruritus 08/09/2016  . Chronic anticoagulation 04/28/2016  . Cardiac pacemaker in situ 04/28/2016  . Chronic systolic congestive heart failure ([place]) 04/28/2016  . Left hemiparesis ([place]) 04/25/2016  . Encounter for therapeutic drug monitoring 04/19/2016  . Cardiomyopathy, ischemic   . Cerebral embolism with cerebral infarction 03/03/2016  . Normocytic anemia   . Thrombocytopenia ([place])   . Acute left hemiparesis ([place])   . Peripheral neuropathic pain   . Hemiplegia ([place]) 03/02/2016  . Adenopathy   . Long term current use of anticoagulant therapy 01/23/2016  . CAD (coronary artery disease) 01/23/2016  . Second degree Mobitz II AV block 01/13/2016  . Persistent atrial fibrillation ([place])   . Atrial fibrillation with slow ventricular response ([place])   . Bradycardia 12/29/2015  . Acute upper respiratory infection 12/23/2015  . RLS (restless legs syndrome) 09/09/2015  . Chronic combined systolic and diastolic CHF, NYHA class 2 ([place]) 04/17/2015  . Vitamin D  deficiency 03/11/2015  . Cardiomyopathy -etiology uncertain 42/59/5638  . Paroxysmal atrial fibrillation ([place]) 08/27/2014  . 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 H/O: stroke 08/27/2014  . GERD (gastroesophageal reflux disease) 02/07/2011  . BPH (benign prostatic hypertrophy) 07/12/2010  . Anxiety state 11/02/2009  . Essential hypertension 11/02/2009    Past Surgical History:  Procedure Laterality Date  .  CARDIAC CATHETERIZATION  2006   Negative / 4 years ago  . EP IMPLANTABLE DEVICE N/A 01/13/2016   Procedure: Pacemaker Implant;  Surgeon: Thompson Grayer, MD;  Location: [facility];  Service: Cardiovascular;  Laterality: N/A;        Home Medications    Prior to Admission medications   Medication Sig Start Date End Date Taking? Authorizing Provider  ALPRAZolam (XANAX) 0.5 MG tablet TAKE 1 TABLET BY MOUTH TWICE A DAY AS NEEDED FOR ANXIETY Patient taking differently: Take 0.5 mg by mouth 2 (two) times daily. FOR ANXIETY 07/24/18  Yes Donne Hazel, MD  carvedilol (COREG) 6.25 MG tablet Take 3.125 mg by mouth 2 (two) times daily with a meal.    Yes [provider]  docusate sodium (COLACE) 100 MG capsule Take 100 mg by mouth 2 (two) times daily.   Yes [provider]  fluticasone (FLONASE) 50 MCG/ACT nasal spray Place 2 sprays into both nostrils 2 (two) times daily as needed for rhinitis.    Yes [provider]  omeprazole (PRILOSEC) 20 MG capsule TAKE 1 CAPSULE (20 MG TOTAL) BY MOUTH DAILY AS NEEDED (FOR ACID REFLEX). 08/30/18  Yes Burchette, Alinda Sierras, MD  pramipexole (MIRAPEX) 0.125 MG tablet TAKE 2 TABLETS BY MOUTH AT NIGHT FOR RESTLESS LEGS Patient taking differently: Take 0.25 mg by mouth at bedtime. FOR RESTLESS LEGS 07/29/18  Yes Burchette, Alinda Sierras, MD  sertraline (ZOLOFT) 100 MG tablet TAKE 1/2 TABLET DAILY NEEDS OFFICE VISIT Patient taking differently: Take 50 mg by mouth daily.  05/29/18  Yes Burchette, Alinda Sierras, MD  tamsulosin (FLOMAX) 0.4 MG CAPS capsule Take 0.4 mg by mouth daily.    Yes [provider]  traMADol (ULTRAM) 50 MG tablet Take 50 mg by mouth every 6 (six) hours as needed (pain).  09/11/18  Yes [provider]    Family History Family History  Problem Relation Age of Onset  . Pneumonia Father 26       died  . Lung cancer Brother   . Heart disease Brother   . Pancreatic cancer Son   . Healthy Son   . Heart failure Son      Social History Social History   Tobacco Use  . Smoking status: Former Smoker    Packs/day: 0.50    Years: 5.00    Pack years: 2.50  . Smokeless tobacco: Never Used  Substance Use Topics  . Alcohol use: Yes    Alcohol/week: 1.0 standard drinks    Types: 1 Shots of liquor per week    Comment: occ  . Drug use: No     Allergies   Chocolate   Review of Systems Review of Systems  Respiratory: Positive for cough.   All other systems reviewed and are negative.    Physical Exam Updated Vital Signs BP 131/83 (BP Location: Right Arm)   Pulse 60   Temp (!) 97.4 F (36.3 C) (Oral)   Resp 17   SpO2 100%   Physical Exam  Constitutional: He appears well-developed and well-nourished.  HENT:  Head: Normocephalic and atraumatic.  Cardiovascular: Normal rate and regular  rhythm.  No murmur heard. Pulmonary/Chest: Effort normal and breath sounds normal. No respiratory distress.  Abdominal: Soft. There is no tenderness. There is no rebound and no guarding.  Musculoskeletal:  One plus pitting edema to the left shin and ankle. No tenderness to palpation over the right knee. No erythema or edema to the knee. There is mild tenderness to the right proximal forearm.  Neurological: He is alert.  Oriented to person in place. Disoriented to time. Five out of five strength in all four extremities.  Skin: Skin is warm and dry.  Psychiatric: He has a normal mood and affect. His behavior is normal.  Nursing note and vitals reviewed.    ED Treatments / Results  Labs (all labs ordered are listed, but only abnormal results are displayed) Labs Reviewed  COMPREHENSIVE METABOLIC PANEL - Abnormal; Notable for the following components:      Result Value   Glucose, Bld 105 (*)    Creatinine, Ser 1.46 (*)    Total Protein 6.4 (*)    Total Bilirubin 1.5 (*)    GFR calc non Af Amer 39 (*)    GFR calc Af Amer 45 (*)    All other components within normal limits  CBC WITH  DIFFERENTIAL/PLATELET - Abnormal; Notable for the following components:   RBC 3.63 (*)    Hemoglobin 10.7 (*)    HCT 34.1 (*)    Lymphs Abs 0.6 (*)    All other components within normal limits  BRAIN NATRIURETIC PEPTIDE - Abnormal; Notable for the following components:   B Natriuretic Peptide 803.0 (*)    All other components within normal limits  D-DIMER, QUANTITATIVE (NOT AT [facility])  I-STAT TROPONIN, ED    EKG None  Radiology Dg Chest 2 View  Result Date: 09/23/2018 CLINICAL DATA:  Cough EXAM: CHEST - 2 VIEW COMPARISON:  09/14/2018, 07/19/2018 FINDINGS: Cardiac enlargement.  Single lead pacemaker unchanged. Bibasilar airspace densities have improved in the interval. This may have been interstitial edema on the prior study. No effusion. No definite superimposed pneumonia. Right lower lobe airspace disease is unchanged from 07/19/2018. Atherosclerotic aorta. IMPRESSION: Improvement in interstitial edema since prior study. Right lower lobe atelectasis/scarring. No acute pneumonia. Electronically Signed   By: Franchot Gallo M.D.   On: 09/23/2018 16:49    Procedures Procedures (including critical care time)  Medications Ordered in ED Medications - No data to display   Initial Impression / Assessment and Plan / ED Course  I have reviewed the triage vital signs and the nursing notes.  Pertinent labs & imaging results that were available during my care of the patient were reviewed by me and considered in my medical decision making (see chart for details).     Patient here for evaluation of transient altered mental status, cough. Family states that he was lethargic and sleepy earlier today when home health went to check on him. His confusion and lethargy were after taking tramadol. Family states that he is back to his baseline mental status currently. He has asymmetric lower extremity edema, complains of cough and D dimer was obtained. D dimer is elevated and CTA was obtained. CT is  negative for PE but does demonstrate right middle and lower lobe pneumonia. Given reports by family for change in mental status, increased cough will treat with antibiotics for presumed pneumonia. Plan to admit for ongoing treatment. Medicine consulted for admission.  Final Clinical Impressions(s) / ED Diagnoses   Final diagnoses:  None    ED Discharge  Orders    None       Quintella Reichert, MD 09/24/18 [phone]

## 2018-09-23 NOTE — Telephone Encounter (Unsigned)
Copied from [place] [id number]. Topic: Inquiry >> Sep 23, 2018 12:11 PM Scherrie Gerlach wrote: Reason for CRM: Mallory with [facility] OT needs verbal orders 2 wk 6

## 2018-09-23 NOTE — Telephone Encounter (Signed)
  FYI  Son walked into the office.  Patient was recently released from the hospital.  Today at approximately 1 pm, the patient complained to the home health nurse of left lower side pain, right knee pain.  The son states the patient" sounds congested when he is asleep and the nurse thinks he is aspirating food".   I went to speak to the patient who was asleep in the car.  When awoken he seemed a little confused.  He states he "feels the same with some side pain".  I advised the son to take the patient to the ED.  I called [facility] to inform them that the patient was in route by car.

## 2018-09-23 NOTE — ED Triage Notes (Signed)
Pt to ER sent by PCP for rule out pneumonia per patient. Pt denies chest pain, denies shortness of breath.

## 2018-09-23 NOTE — ED Notes (Signed)
Pt in room with family @ bedside VS documented Call light within reach 

## 2018-09-23 NOTE — ED Provider Notes (Addendum)
Patient placed in Quick Look pathway, seen and evaluated   Chief Complaint: Don't feel good.  HPI:   Micheal Nicholson is a 84 y.o. male who presents to ED by PCP. Patient poor historian. He reports being told that he needs to come to the ER to make sure he doesn't have double pneumonia because he has fluid on him. He states that his legs have been swollen. Denies chest pain, shortness of breath, fever, cough  ROS: - cough, congestion, chest pain  Physical Exam:   Gen: No distress             Heart: tachycardic  Neuro: Awake and Alert  Skin: Warm    Initiation of care has begun. The patient has been counseled on the process, plan, and necessity for staying for the completion/evaluation, and the remainder of the medical screening examination   Ward, Ozella Almond, PA-C 09/23/18 [place], MD 09/24/18 [phone]

## 2018-09-23 NOTE — Telephone Encounter (Signed)
OK 

## 2018-09-23 NOTE — Telephone Encounter (Signed)
Last OV 07/19/18, Next OV tomorrow  Please advise.

## 2018-09-23 NOTE — Telephone Encounter (Signed)
Agree with advice given and discussed case with nurse.

## 2018-09-24 ENCOUNTER — Other Ambulatory Visit: Payer: Self-pay

## 2018-09-24 ENCOUNTER — Inpatient Hospital Stay (HOSPITAL_COMMUNITY): Payer: Medicare HMO

## 2018-09-24 ENCOUNTER — Observation Stay (HOSPITAL_COMMUNITY): Payer: Medicare HMO

## 2018-09-24 ENCOUNTER — Encounter (HOSPITAL_COMMUNITY): Payer: Self-pay | Admitting: *Deleted

## 2018-09-24 ENCOUNTER — Inpatient Hospital Stay: Payer: Medicare HMO | Admitting: Family Medicine

## 2018-09-24 DIAGNOSIS — N184 Chronic kidney disease, stage 4 (severe): Secondary | ICD-10-CM | POA: Insufficient documentation

## 2018-09-24 DIAGNOSIS — N400 Enlarged prostate without lower urinary tract symptoms: Secondary | ICD-10-CM | POA: Diagnosis present

## 2018-09-24 DIAGNOSIS — Z800 Family history of malignant neoplasm of digestive organs: Secondary | ICD-10-CM | POA: Diagnosis not present

## 2018-09-24 DIAGNOSIS — Z801 Family history of malignant neoplasm of trachea, bronchus and lung: Secondary | ICD-10-CM | POA: Diagnosis not present

## 2018-09-24 DIAGNOSIS — R1313 Dysphagia, pharyngeal phase: Secondary | ICD-10-CM | POA: Diagnosis present

## 2018-09-24 DIAGNOSIS — T17908A Unspecified foreign body in respiratory tract, part unspecified causing other injury, initial encounter: Secondary | ICD-10-CM | POA: Diagnosis present

## 2018-09-24 DIAGNOSIS — J690 Pneumonitis due to inhalation of food and vomit: Secondary | ICD-10-CM | POA: Diagnosis present

## 2018-09-24 DIAGNOSIS — I480 Paroxysmal atrial fibrillation: Secondary | ICD-10-CM

## 2018-09-24 DIAGNOSIS — R7989 Other specified abnormal findings of blood chemistry: Secondary | ICD-10-CM | POA: Diagnosis not present

## 2018-09-24 DIAGNOSIS — Z8249 Family history of ischemic heart disease and other diseases of the circulatory system: Secondary | ICD-10-CM | POA: Diagnosis not present

## 2018-09-24 DIAGNOSIS — F411 Generalized anxiety disorder: Secondary | ICD-10-CM | POA: Diagnosis present

## 2018-09-24 DIAGNOSIS — I69354 Hemiplegia and hemiparesis following cerebral infarction affecting left non-dominant side: Secondary | ICD-10-CM | POA: Diagnosis not present

## 2018-09-24 DIAGNOSIS — Z87891 Personal history of nicotine dependence: Secondary | ICD-10-CM | POA: Diagnosis not present

## 2018-09-24 DIAGNOSIS — T17908D Unspecified foreign body in respiratory tract, part unspecified causing other injury, subsequent encounter: Secondary | ICD-10-CM | POA: Diagnosis not present

## 2018-09-24 DIAGNOSIS — Z8673 Personal history of transient ischemic attack (TIA), and cerebral infarction without residual deficits: Secondary | ICD-10-CM

## 2018-09-24 DIAGNOSIS — R627 Adult failure to thrive: Secondary | ICD-10-CM | POA: Diagnosis present

## 2018-09-24 DIAGNOSIS — N183 Chronic kidney disease, stage 3 (moderate): Secondary | ICD-10-CM | POA: Diagnosis present

## 2018-09-24 DIAGNOSIS — G9341 Metabolic encephalopathy: Secondary | ICD-10-CM | POA: Diagnosis present

## 2018-09-24 DIAGNOSIS — D649 Anemia, unspecified: Secondary | ICD-10-CM | POA: Diagnosis present

## 2018-09-24 DIAGNOSIS — K219 Gastro-esophageal reflux disease without esophagitis: Secondary | ICD-10-CM | POA: Diagnosis present

## 2018-09-24 DIAGNOSIS — I5023 Acute on chronic systolic (congestive) heart failure: Secondary | ICD-10-CM | POA: Diagnosis present

## 2018-09-24 DIAGNOSIS — I130 Hypertensive heart and chronic kidney disease with heart failure and stage 1 through stage 4 chronic kidney disease, or unspecified chronic kidney disease: Secondary | ICD-10-CM | POA: Diagnosis present

## 2018-09-24 LAB — TROPONIN I
Troponin I: <0.03 ng/mL (ref <0.03)
Troponin I: <0.03 ng/mL (ref <0.03)
Troponin I: <0.03 ng/mL (ref <0.03)

## 2018-09-24 LAB — PROCALCITONIN: Procalcitonin: <0.1 ng/mL

## 2018-09-24 LAB — LACTIC ACID, PLASMA: LACTIC ACID, VENOUS: 1.4 mmol/L (ref 0.5–1.9)

## 2018-09-24 MED ORDER — FUROSEMIDE 10 MG/ML IJ SOLN: 40.0000 mg | Freq: Two times a day (BID) | INTRAMUSCULAR | Status: DC

## 2018-09-24 MED ORDER — TAMSULOSIN HCL 0.4 MG PO CAPS
0.4000 mg | ORAL_CAPSULE | Freq: Every day | ORAL | Status: DC
  Administered 2018-09-24 – 2018-09-25 (×2): 0.4 mg via ORAL
  Filled 2018-09-24 (×2): qty 1

## 2018-09-24 MED ORDER — ACETAMINOPHEN 325 MG PO TABS: 650.0000 mg | ORAL_TABLET | ORAL | Status: DC | PRN

## 2018-09-24 MED ORDER — ALBUTEROL SULFATE (2.5 MG/3ML) 0.083% IN NEBU: 2.5000 mg | INHALATION_SOLUTION | RESPIRATORY_TRACT | Status: DC | PRN

## 2018-09-24 MED ORDER — SODIUM CHLORIDE 0.9% FLUSH
3.0000 mL | Freq: Two times a day (BID) | INTRAVENOUS | Status: DC
  Administered 2018-09-24 – 2018-09-25 (×3): 3 mL via INTRAVENOUS

## 2018-09-24 MED ORDER — AMOXICILLIN-POT CLAVULANATE 500-125 MG PO TABS
1.0000 | ORAL_TABLET | Freq: Two times a day (BID) | ORAL | Status: DC
  Administered 2018-09-24 – 2018-09-25 (×2): 500 mg via ORAL
  Filled 2018-09-24 (×3): qty 1

## 2018-09-24 MED ORDER — ENOXAPARIN SODIUM 30 MG/0.3ML ~~LOC~~ SOLN
30.0000 mg | SUBCUTANEOUS | Status: DC
  Administered 2018-09-24 – 2018-09-25 (×2): 30 mg via SUBCUTANEOUS
  Filled 2018-09-24 (×2): qty 0.3

## 2018-09-24 MED ORDER — SODIUM CHLORIDE 0.9 % IV SOLN
1.0000 g | Freq: Once | INTRAVENOUS | Status: AC
  Administered 2018-09-24: 1 g via INTRAVENOUS
  Filled 2018-09-24: qty 10

## 2018-09-24 MED ORDER — HYDRALAZINE HCL 20 MG/ML IJ SOLN: 5.0000 mg | INTRAMUSCULAR | Status: DC | PRN

## 2018-09-24 MED ORDER — SERTRALINE HCL 50 MG PO TABS
50.0000 mg | ORAL_TABLET | Freq: Every day | ORAL | Status: DC
  Administered 2018-09-24 – 2018-09-25 (×2): 50 mg via ORAL
  Filled 2018-09-24 (×2): qty 1

## 2018-09-24 MED ORDER — ASPIRIN EC 81 MG PO TBEC
81.0000 mg | DELAYED_RELEASE_TABLET | Freq: Every day | ORAL | Status: DC
  Administered 2018-09-24 – 2018-09-25 (×2): 81 mg via ORAL
  Filled 2018-09-24 (×2): qty 1

## 2018-09-24 MED ORDER — SODIUM CHLORIDE 0.9% FLUSH: 3.0000 mL | INTRAVENOUS | Status: DC | PRN

## 2018-09-24 MED ORDER — PANTOPRAZOLE SODIUM 40 MG PO TBEC
40.0000 mg | DELAYED_RELEASE_TABLET | Freq: Every day | ORAL | Status: DC
  Administered 2018-09-24 – 2018-09-25 (×2): 40 mg via ORAL
  Filled 2018-09-24 (×2): qty 1

## 2018-09-24 MED ORDER — SODIUM CHLORIDE 0.9 % IV SOLN: 250.0000 mL | INTRAVENOUS | Status: DC | PRN

## 2018-09-24 MED ORDER — TRAMADOL HCL 50 MG PO TABS
50.0000 mg | ORAL_TABLET | Freq: Four times a day (QID) | ORAL | Status: DC | PRN
  Administered 2018-09-24: 50 mg via ORAL
  Filled 2018-09-24: qty 1

## 2018-09-24 MED ORDER — AMOXICILLIN-POT CLAVULANATE 875-125 MG PO TABS: 1.0000 | ORAL_TABLET | Freq: Two times a day (BID) | ORAL | Status: DC

## 2018-09-24 MED ORDER — FUROSEMIDE 10 MG/ML IJ SOLN
20.0000 mg | Freq: Two times a day (BID) | INTRAMUSCULAR | Status: DC
  Administered 2018-09-24: 20 mg via INTRAVENOUS
  Filled 2018-09-24: qty 2

## 2018-09-24 MED ORDER — CARVEDILOL 3.125 MG PO TABS
3.1250 mg | ORAL_TABLET | Freq: Two times a day (BID) | ORAL | Status: DC
  Administered 2018-09-24 – 2018-09-25 (×3): 3.125 mg via ORAL
  Filled 2018-09-24 (×3): qty 1

## 2018-09-24 MED ORDER — DOCUSATE SODIUM 100 MG PO CAPS
100.0000 mg | ORAL_CAPSULE | Freq: Two times a day (BID) | ORAL | Status: DC
  Administered 2018-09-24 (×2): 100 mg via ORAL
  Filled 2018-09-24 (×3): qty 1

## 2018-09-24 MED ORDER — SODIUM CHLORIDE 0.9 % IV SOLN
500.0000 mg | Freq: Once | INTRAVENOUS | Status: AC
  Administered 2018-09-24: 500 mg via INTRAVENOUS
  Filled 2018-09-24: qty 500

## 2018-09-24 MED ORDER — PRAMIPEXOLE DIHYDROCHLORIDE 0.25 MG PO TABS
0.2500 mg | ORAL_TABLET | Freq: Every day | ORAL | Status: DC
  Administered 2018-09-24 (×2): 0.25 mg via ORAL
  Filled 2018-09-24 (×3): qty 1

## 2018-09-24 MED ORDER — ZOLPIDEM TARTRATE 5 MG PO TABS
5.0000 mg | ORAL_TABLET | Freq: Every evening | ORAL | Status: DC | PRN
  Administered 2018-09-24: 5 mg via ORAL
  Filled 2018-09-24: qty 1

## 2018-09-24 MED ORDER — ONDANSETRON HCL 4 MG/2ML IJ SOLN: 4.0000 mg | Freq: Four times a day (QID) | INTRAMUSCULAR | Status: DC | PRN

## 2018-09-24 MED ORDER — DM-GUAIFENESIN ER 30-600 MG PO TB12: 1.0000 | ORAL_TABLET | Freq: Two times a day (BID) | ORAL | Status: DC | PRN

## 2018-09-24 MED ORDER — ALPRAZOLAM 0.5 MG PO TABS
0.5000 mg | ORAL_TABLET | Freq: Two times a day (BID) | ORAL | Status: DC
  Administered 2018-09-24 (×3): 0.5 mg via ORAL
  Filled 2018-09-24 (×4): qty 1

## 2018-09-24 MED ORDER — FLUTICASONE PROPIONATE 50 MCG/ACT NA SUSP
2.0000 | Freq: Two times a day (BID) | NASAL | Status: DC | PRN
  Filled 2018-09-24: qty 16

## 2018-09-24 NOTE — Progress Notes (Signed)
Modified Barium Swallow Progress Note  Patient Details  Name: Micheal Nicholson MRN: [id number] Date of Birth: Nov 18, 1920  Today's Date: 09/24/2018  Modified Barium Swallow completed.  Full report located under Chart Review in the Imaging Section.  Brief recommendations include the following:  Clinical Impression  Pt presented with severe pharyngeal dysphagia marked by decreased sensimotor impairments exacerbated what appears to be cervical/ pharyngeal rigidity resulting in silent aspiration of thin and honey thick liquids, moderate vallecular/pharyngeal residue, and delayed swallow initiation to the valleculae and pyriform sinuses. Mild lingual pumping was also noted across all consistencies except thin. Silent penetration to the vocal folds followed by aspiration during the swallow with thin observed. Throughout study, pt exhibited moderate vallecular/pharyngeal residue which was subsequently aspirated without sensation with honey thick and thin. Silent penetration to the vocal folds from nectar thick residue was cleared from vestibule via cued cough. No aiway intrusion was noted with deglutation of puree or regular POs, although post-swallow pharyngeal residue proved to be problematic, as previously described. Supraglottic swallow strategy was not effective to provide additional airway protection, however cued dry swallows assisted in clearance of vallecular/pharyngeal residue. SLP discussed results with pt's son who verbalizes understanding risk associated with chronic aspiration and wishes to continue PO intake (SLP spoke with MD as well). Recommend continue regular diet with thin liquids, meds whole in applesauce as an added precaution, minimize environmental distractions take small bites/sips, multiple dry swallows and hard volitional cough following swallow. ST will continue to provide treatment with diet safety and efficiency, further pt/family education, and training with safe swallow  compensatory stratgies.    Swallow Evaluation Recommendations       SLP Diet Recommendations: Thin liquid;Regular solids   Liquid Administration via: Cup   Medication Administration: Whole meds with puree   Supervision: Full supervision/cueing for compensatory strategies;Patient able to self feed   Compensations: Minimize environmental distractions;Slow rate;Small sips/bites;Hard cough after swallow;Multiple dry swallows after each bite/sip   Postural Changes: Seated upright at 90 degrees   Oral Care Recommendations: Oral care before and after PO;Oral care BID        Houston Siren 09/24/2018,2:33 PM   Orbie Pyo Colvin Caroli.Ed Risk analyst [phone] Office [phone]

## 2018-09-24 NOTE — Progress Notes (Addendum)
I have seen this patient and agree with my partners history and physical as of this morning- Further details as below   pleasant very coherent 84 year old --former [employer] veteran [employer] 2 with lots of stories Persistent atrial fibrillation left hemiparesis secondary to embolic stroke Long-standing history conduction anomalies PPM 12/2015 [facility]  brought in with confusion cough and acute metabolic encephalopathy per caregivers- Patient gives me a slightly different history-he tells me that he was having neck pain chest pain and abdominal pain and his son [name] got somewhat worried and decided to bring him into the hospital to get evaluated.  work-up reveals predominant heart failure >pneumonia Procalcitonin not suggestive of active lung infection BNP elevated 800 and positive JVD confirm this-CT angiogram was performed which is equivocal for infection again as above Patient was started on Lasix IV twice daily troponins were trended-it is noted that patient is not on anticoagulation  As I am talking to him in the room he continues to [facility] throughout our discussion for about 10 minutes the same bite-he states that certain foods he has to take a long time to chew He coughs occasionally  At baseline he is able to walk about 100 feet and back and uses a walker/cane per his own admission He is not on any diuretic He has not been around anyone sick He lives alone but his son checks on him frequently  Has had some weight loss since [facility]--they were concerned about him not being able to swallow   P Repeat x-ray a.m.-would hold further diuretics and antibiotics at this stage given he does not appear toxic but in a 84 year old it would be prudent to recheck and monitor him in the hospital at least another night Speech therapy to see him again-historically he does not like a dysphagia diet and we may have to come up with different alternatives for him to engage him Labs in the morning  Long  discussion with the son [name] who understands clearly we will get therapy eval he may may not benefit from further skilled care in the outpatient setting  Verneita Griffes, MD [facility] 9:03 AM   Addendum-after discussion with speech therapy, I will start Augmentin-we will monitor and see his white count labs a.m. and decide on further work-up-I would not give him any more diuretics at this stage

## 2018-09-24 NOTE — H&P (Addendum)
History and Physical    Micheal Nicholson JAS:[id number] DOB: 08-May-1920 DOA: 09/23/2018  Referring MD/NP/PA:   PCP: [name], MD   Patient coming from:  The patient is coming from home.  At baseline, pt is ddependent for most of ADL.   Chief Complaint: cough and confusion  HPI: Micheal Nicholson is a 84 y.o. male with medical history significant of hypertension, stroke with left-sided weakness, GERD, depression, anxiety, BPH, CHF with EF of 20%, PPM placement, CKD 3, atrial fibrillation not on anticoagulants, who presents with cough and confusion  Per report, pt was brought in due to concerning for confusion and cough. He was reportedly to have had poor sleeping and more confusion than his baseline. When I saw pt in ED, he is alert, he is orientated to the place and person, but not to time. Not sure about his baseline mental status, but he seems to be at his baseline at this moment. Per report, pt has pain in the right knee and left side of his abdomen. He told me that he has some pain both knee, but denies abdominal pain, nausea vomiting or diarrhea.  He has left sided weakness from previous stroke, no slurred speech, facial droop or hearing loss.  Patient denies chest pain, shortness of breath. He has mild dry cough in ED.  No fever or chills.  Denies symptoms of UTI.  ED Course: pt was found to have WBC 4.8, BNP 803, positive d-dimer 1.36, stable renal function, temperature 97.5, heart rate 57- 111, tachypnea, oxygen saturation 95% on room air, chest x-ray showed interstitial pulmonary edema.  CT angiogram is negative for PE, but showed multifocal patchy infiltration particularly in MRL and RLL.  Patient is placed on telemetry bed for observation.  Review of Systems:   General: no fevers, chills, no body weight gain, has fatigue HEENT: no blurry vision, hearing changes or sore throat Respiratory: no dyspnea, has coughing, no wheezing CV: no chest pain, no  palpitations GI: no nausea, vomiting, abdominal pain, diarrhea, constipation GU: no dysuria, burning on urination, increased urinary frequency, hematuria  Ext: has left leg edema Neuro: No vision change or hearing loss. Has chronic left sided weakness. Skin: no rash, no skin tear. MSK: No muscle spasm, no deformity, no limitation of range of movement in spin Heme: No easy bruising.  Travel history: No recent long distant travel.  Allergy:  Allergies  Allergen Reactions  . Chocolate Other (See Comments)    Acid reflux     Past Medical History:  Diagnosis Date  . Anxiety   . Arthritis   . BPH (benign prostatic hyperplasia)   . CHF (congestive heart failure) ([place])   . Chronic combined systolic and diastolic CHF, NYHA class 2 ([place]) 04/17/2015  . GERD (gastroesophageal reflux disease)   . Hypertension   . Stroke ([place])   . Symptomatic sinus bradycardia    PPM Dr. Rayann Heman, 01/13/16, STJ device    Past Surgical History:  Procedure Laterality Date  . CARDIAC CATHETERIZATION  2006   Negative / 4 years ago  . EP IMPLANTABLE DEVICE N/A 01/13/2016   Procedure: Pacemaker Implant;  Surgeon: Thompson Grayer, MD;  Location: [facility];  Service: Cardiovascular;  Laterality: N/A;    Social History:  reports that he has quit smoking. He has a 2.50 pack-year smoking history. He has never used smokeless tobacco. He reports that he drinks about 1.0 standard drinks of alcohol per week. He reports that he does not use  drugs.  Family History:  Family History  Problem Relation Age of Onset  . Pneumonia Father 79       died  . Lung cancer Brother   . Heart disease Brother   . Pancreatic cancer Son   . Healthy Son   . Heart failure Son      Prior to Admission medications   Medication Sig Start Date End Date Taking? Authorizing Provider  ALPRAZolam (XANAX) 0.5 MG tablet TAKE 1 TABLET BY MOUTH TWICE A DAY AS NEEDED FOR ANXIETY Patient taking differently: Take 0.5 mg by mouth 2 (two) times  daily. FOR ANXIETY 07/24/18  Yes Donne Hazel, MD  carvedilol (COREG) 6.25 MG tablet Take 3.125 mg by mouth 2 (two) times daily with a meal.    Yes [provider]  docusate sodium (COLACE) 100 MG capsule Take 100 mg by mouth 2 (two) times daily.   Yes [provider]  fluticasone (FLONASE) 50 MCG/ACT nasal spray Place 2 sprays into both nostrils 2 (two) times daily as needed for rhinitis.    Yes [provider]  omeprazole (PRILOSEC) 20 MG capsule TAKE 1 CAPSULE (20 MG TOTAL) BY MOUTH DAILY AS NEEDED (FOR ACID REFLEX). 08/30/18  Yes Burchette, Alinda Sierras, MD  pramipexole (MIRAPEX) 0.125 MG tablet TAKE 2 TABLETS BY MOUTH AT NIGHT FOR RESTLESS LEGS Patient taking differently: Take 0.25 mg by mouth at bedtime. FOR RESTLESS LEGS 07/29/18  Yes Burchette, Alinda Sierras, MD  sertraline (ZOLOFT) 100 MG tablet TAKE 1/2 TABLET DAILY NEEDS OFFICE VISIT Patient taking differently: Take 50 mg by mouth daily.  05/29/18  Yes Burchette, Alinda Sierras, MD  tamsulosin (FLOMAX) 0.4 MG CAPS capsule Take 0.4 mg by mouth daily.    Yes [provider]  traMADol (ULTRAM) 50 MG tablet Take 50 mg by mouth every 6 (six) hours as needed (pain).  09/11/18  Yes [provider]    Physical Exam: Vitals:   09/24/18 0030 09/24/18 0115 09/24/18 0145 09/24/18 0215  BP: 130/75 129/78 (!) 129/96 130/82  Pulse: (!) 57 (!) 31 72 70  Resp: 20 18 16 19   Temp:      TempSrc:      SpO2: 95% 92% 95% 95%   General: Not in acute distress HEENT:       Eyes: PERRL, EOMI, no scleral icterus.       ENT: No discharge from the ears and nose, no pharynx injection, no tonsillar enlargement.        Neck: positive JVD, no bruit, no mass felt. Heme: No neck lymph node enlargement. Cardiac: S1/S2, RRR, No murmurs, No gallops or rubs. Respiratory: No rales, wheezing, rhonchi or rubs. GI: Soft, nondistended, nontender, no rebound pain, no organomegaly, BS present. GU: No hematuria Ext: 1+ left leg edema.  Has trace  right left leg edema. 2+DP/PT pulse bilaterally. Musculoskeletal: No joint deformities, No joint redness or warmth, no limitation of ROM in spin. Skin: No rashes.  Neuro: Alert, oriented to place and person, not to time, cranial nerves II-XII grossly intact, has left-sided weakness Psych: Patient is not psychotic, no suicidal or hemocidal ideation.  Labs on Admission: I have personally reviewed following labs and imaging studies  CBC: Recent Labs  Lab 09/23/18 1619  WBC 4.8  NEUTROABS 3.7  HGB 10.7*  HCT 34.1*  MCV 93.9  PLT 509   Basic Metabolic Panel: Recent Labs  Lab 09/23/18 1619  NA 141  K 4.7  CL 105  CO2 23  GLUCOSE 105*  BUN 21  CREATININE 1.46*  CALCIUM 9.2   GFR: Estimated Creatinine Clearance: 25.2 mL/min (A) (by C-G formula based on SCr of 1.46 mg/dL (H)). Liver Function Tests: Recent Labs  Lab 09/23/18 1619  AST 28  ALT 15  ALKPHOS 63  BILITOT 1.5*  PROT 6.4*  ALBUMIN 3.5   No results for input(s): LIPASE, AMYLASE in the last 168 hours. No results for input(s): AMMONIA in the last 168 hours. Coagulation Profile: No results for input(s): INR, PROTIME in the last 168 hours. Cardiac Enzymes: No results for input(s): CKTOTAL, CKMB, CKMBINDEX, TROPONINI in the last 168 hours. BNP (last 3 results) No results for input(s): PROBNP in the last 8760 hours. HbA1C: No results for input(s): HGBA1C in the last 72 hours. CBG: No results for input(s): GLUCAP in the last 168 hours. Lipid Profile: No results for input(s): CHOL, HDL, LDLCALC, TRIG, CHOLHDL, LDLDIRECT in the last 72 hours. Thyroid Function Tests: No results for input(s): TSH, T4TOTAL, FREET4, T3FREE, THYROIDAB in the last 72 hours. Anemia Panel: No results for input(s): VITAMINB12, FOLATE, FERRITIN, TIBC, IRON, RETICCTPCT in the last 72 hours. Urine analysis:    Component Value Date/Time   COLORURINE STRAW (A) 09/14/2018 1142   APPEARANCEUR CLEAR 09/14/2018 1142   LABSPEC 1.005 09/14/2018  1142   PHURINE 7.0 09/14/2018 1142   GLUCOSEU NEGATIVE 09/14/2018 1142   HGBUR NEGATIVE 09/14/2018 1142   BILIRUBINUR NEGATIVE 09/14/2018 1142   BILIRUBINUR negative 11/10/2013 1713   KETONESUR NEGATIVE 09/14/2018 1142   PROTEINUR NEGATIVE 09/14/2018 1142   UROBILINOGEN 1.0 03/14/2015 1608   NITRITE NEGATIVE 09/14/2018 1142   LEUKOCYTESUR NEGATIVE 09/14/2018 1142   Sepsis Labs: @LABRCNTIP (procalcitonin:4,lacticidven:4) ) Recent Results (from the past 240 hour(s))  MRSA PCR Screening     Status: Abnormal   Collection Time: 09/14/18  8:37 PM  Result Value Ref Range Status   MRSA by PCR POSITIVE (A) NEGATIVE Final    Comment:        The GeneXpert MRSA Assay (FDA approved for NASAL specimens only), is one component of a comprehensive MRSA colonization surveillance program. It is not intended to diagnose MRSA infection nor to guide or monitor treatment for MRSA infections. RESULT CALLED TO, READ BACK BY AND VERIFIED WITH: RN Trinda Pascal [phone] MLM Performed at [facility], [address], [place], [place] [address]      Radiological Exams on Admission: Dg Chest 2 View  Result Date: 09/23/2018 CLINICAL DATA:  Cough EXAM: CHEST - 2 VIEW COMPARISON:  09/14/2018, 07/19/2018 FINDINGS: Cardiac enlargement.  Single lead pacemaker unchanged. Bibasilar airspace densities have improved in the interval. This may have been interstitial edema on the prior study. No effusion. No definite superimposed pneumonia. Right lower lobe airspace disease is unchanged from 07/19/2018. Atherosclerotic aorta. IMPRESSION: Improvement in interstitial edema since prior study. Right lower lobe atelectasis/scarring. No acute pneumonia. Electronically Signed   By: Franchot Gallo M.D.   On: 09/23/2018 16:49   Ct Angio Chest Pe W/cm &/or Wo Cm  Result Date: 09/24/2018 CLINICAL DATA:  Leg swelling, elevated D-dimer EXAM: CT ANGIOGRAPHY CHEST WITH CONTRAST TECHNIQUE: Multidetector CT imaging of the chest  was performed using the standard protocol during bolus administration of intravenous contrast. Multiplanar CT image reconstructions and MIPs were obtained to evaluate the vascular anatomy. CONTRAST:  19mL ISOVUE-370 IOPAMIDOL (ISOVUE-370) INJECTION 76% COMPARISON:  Chest radiographs dated 09/23/2018. CT chest dated 03/03/2016. FINDINGS: Cardiovascular: Evaluation of the bilateral pulmonary arteries is mildly constrained by respiratory motion. Satisfactory opacification the bilateral  pulmonary artery to the lobar level. No evidence pulmonary embolism. No evidence of thoracic aortic aneurysm. Atherosclerotic calcifications of the aortic arch. The heart is mildly enlarged.  No pericardial effusion. Coronary atherosclerosis the LAD and right coronary artery. Mediastinum/Nodes: Mediastinal lymphadenopathy, including a dominant 16 mm subcarinal node, grossly unchanged and likely reactive. Visualized thyroid is unremarkable. Lungs/Pleura: Mild patchy opacity in the right middle lobe and right lower lobe, favoring mild pneumonia, less likely atelectasis. Additional faint ground-glass opacity in the posterior right upper lobe and anterior left upper lobe, favoring additional infection. No suspicious pulmonary nodules. No frank interstitial edema. No pleural effusion or pneumothorax. Upper Abdomen: Visualized upper abdomen is notable for 3.2 cm right upper pole renal cyst with thin calcified septations (series 5/image 87), grossly unchanged from 2017. Vascular calcifications. Musculoskeletal: Degenerative changes of the visualized thoracolumbar spine. Review of the MIP images confirms the above findings. IMPRESSION: No evidence of pulmonary embolism. Mild multifocal patchy opacities, predominantly in the right middle and lower lobes, suspicious for pneumonia. No frank interstitial edema.  No pleural effusions. Mediastinal lymphadenopathy, grossly unchanged, likely reactive. Aortic Atherosclerosis (ICD10-I70.0).  Electronically Signed   By: Julian Hy M.D.   On: 09/24/2018 00:10     EKG:  Not done in ED, will get one.   Assessment/Plan Principal Problem:   Acute on chronic systolic CHF (congestive heart failure) (HCC) Active Problems:   Anxiety state   Essential hypertension   Benign prostatic hyperplasia   Paroxysmal atrial fibrillation (HCC)   H/O: stroke   CKD (chronic kidney disease), stage III (HCC)   Acute metabolic encephalopathy   Acute on chronic systolic CHF (congestive heart failure) ([place]): Patient's cough is likely due to CHF exacerbation.  Patient has elevated BNP at 803, interstitial pulmonary edema on chest x-ray, positive JVD, consistent with CHF exacerbation.  D-dimer positive, but CT angiogram is negative for PE.  CT angiogram showed multifocal patchy infiltration, but the patient does not have fever or leukocytosis, clinically does not seem to have pneumonia.  Patient was given 1 dose of Rocephin and azithromycin in ED, will not continue antibiotics.  2D echo on 09/15/2018 showed EF 20%  -will place on tele bed for obs -Lasix 20 mg bid by IV (will two dose of lasix tonight, then 20 mg bid) -trop x 3 -Daily weights -strict I/O's -Low salt diet - start low dose of ASA 81 mg daily -f/u LE venous doppler to r/o DVT due to positive d-dimer and asymmetric leg edema.  HTN:  -Continue home medications: -IV hydralazine prn  Essential hypertension: -Coreg -On IV Lasix - IV hydralazine as needed  Anxiety state: -Xanax  BPH: stable - Continue Flomax  Atrial Fibrillation: CHA2DS2-VASc Score is 6, needs oral anticoagulation, but patient is not on AC at home, not sure why pt is not on [facility], but possibly due to high risk of a fall given advanced age. Heart rate is well controlled. -continue coreg  H/O: stroke: -ASA  CKD (chronic kidney disease), stage III ([place]): stable.  Baseline creatinine 1.4-1.6.  His creatinine is 1.46, BUN 21. -Follow-up by BMP  Acute  metabolic encephalopathy: Patient's mental status seems to have improved to baseline now.  No focal neurologic findings on physical examination. -Frequent neuro check     DVT ppx: SQ Lovenox Code Status: Full code Family Communication: None at bed side.   Disposition Plan:  Anticipate discharge back to previous home environment Consults called:  none Admission status: Obs / tele    Date of  Service 09/24/2018    Ivor Costa [facility] Pager [phone]  If 7PM-7AM, please contact night-coverage [email or web address] Password [email or web address] 09/24/2018, 2:39 AM

## 2018-09-24 NOTE — Progress Notes (Signed)
  Speech Language Pathology Treatment: Dysphagia  Patient Details Name: Micheal Nicholson MRN: [id number] DOB: 1920-03-16 Today's Date: 09/24/2018 Time: 0242-0257 SLP Time Calculation (min) (ACUTE ONLY): 15 min  Assessment / Plan / Recommendation Clinical Impression  Given mod verbal cues, pt recalled meeting SLP during MBS exam earlier this morning andresults of that study were reviewed. He was receptive to further education regarding the correlation between aspiration and pneumonia and pt/family decision to continue PO intake despite risks of aspiration. Wet vocal quality and immediate throat clear noted X1 given sips of thin. SLP provided visual cue and verbal instruction/training to use 2 swallows and hard cough after swallow as compensatory strategy for safest swallow and effort to clear any aspirates. Pt recalled these strategies at the end of our session given min verbal cues. ST will continue to provide treatment regarding diet safety, efficiency and use of compensatory strategies, and further pt/family education.   HPI HPI: Micheal Nicholson is a 84 y.o. male with medical history significant of dysphagia, hypertension, stroke with left-sided weakness, GERD, depression, anxiety, BPH, CHF, PPM placement, CKD 3, atrial fibrillation who presented to ED yesterday with cough, chese pain, and confusion. He lives at home alone (has home health nurse) and was recently d/c (on 9/23) from another admission to [facility]. CXR revealed improvement in interstitial edema since prior study (9/21); right lower lobe atelectasis/scarring; no acute pneumonia.      SLP Plan  Continue with current plan of care       Recommendations  Diet recommendations: Regular;Thin liquid Liquids provided via: Cup Medication Administration: Whole meds with puree Supervision: Full supervision/cueing for compensatory strategies Compensations: Minimize environmental distractions;Slow rate;Small sips/bites;Hard cough after  swallow;Multiple dry swallows after each bite/sip Postural Changes and/or Swallow Maneuvers: Seated upright 90 degrees                Oral Care Recommendations: Oral care BID Follow up Recommendations: Skilled Nursing facility SLP Visit Diagnosis: Dysphagia, pharyngeal phase (R13.13) Plan: Continue with current plan of care       Jettie Booze, Student SLP                 Jettie Booze 09/24/2018, 4:31 PM

## 2018-09-24 NOTE — Progress Notes (Signed)
  Speech Language Pathology  Patient Details Name: Kailen Hinkle MRN: [id number] DOB: [birth year] Today's Date: 09/24/2018 Time:  -     MBS scheduled today for 11:30                           Houston Siren 09/24/2018, 9:39 AM   Orbie Pyo Colvin Caroli.Ed Risk analyst [phone] Office [phone]

## 2018-09-24 NOTE — Progress Notes (Signed)
PT Cancellation Note  Patient Details Name: Micheal Nicholson MRN: [id number] DOB: Jun 11, 1920   Cancelled Treatment:    Reason Eval/Treat Not Completed: Other (comment)(pt lethargic, unable to maintain arousal and given xanax and zoloft in the last hour)   Dallen Bunte B Tonnya Garbett 09/24/2018, 11:06 AM  Elwyn Reach, PT Acute Rehabilitation Services Pager: [phone] Office: [phone]

## 2018-09-24 NOTE — Progress Notes (Signed)
LE venous duplex prelim: negative for DVT. Marium Ragan Eunice, RDMS, RVT  

## 2018-09-24 NOTE — Progress Notes (Signed)
PHARMACY NOTE:  ANTIMICROBIAL RENAL DOSAGE ADJUSTMENT  Current antimicrobial regimen includes a mismatch between antimicrobial dosage and estimated renal function.  As per policy approved by the Pharmacy & Therapeutics and Medical Executive Committees, the antimicrobial dosage will be adjusted accordingly.  Current antimicrobial dosage:  Augmentin 875 mg PO bid  Indication: PNA  Renal Function:  Estimated Creatinine Clearance: 25.9 mL/min (A) (by C-G formula based on SCr of 1.46 mg/dL (H)). []      On intermittent HD, scheduled: []      On CRRT    Antimicrobial dosage has been changed to:  Augmentin 500 mg PO bid   Thank you for allowing pharmacy to be a part of this patient's care.  Renold Genta, PharmD, BCPS Clinical Pharmacist Please check AMION for all Pharmacist numbers by unit 09/24/2018 1:35 PM

## 2018-09-25 ENCOUNTER — Inpatient Hospital Stay (HOSPITAL_COMMUNITY): Payer: Medicare HMO

## 2018-09-25 DIAGNOSIS — F411 Generalized anxiety disorder: Secondary | ICD-10-CM

## 2018-09-25 DIAGNOSIS — N183 Chronic kidney disease, stage 3 (moderate): Secondary | ICD-10-CM

## 2018-09-25 DIAGNOSIS — I10 Essential (primary) hypertension: Secondary | ICD-10-CM

## 2018-09-25 DIAGNOSIS — T17908D Unspecified foreign body in respiratory tract, part unspecified causing other injury, subsequent encounter: Secondary | ICD-10-CM

## 2018-09-25 DIAGNOSIS — I5023 Acute on chronic systolic (congestive) heart failure: Secondary | ICD-10-CM

## 2018-09-25 DIAGNOSIS — G9341 Metabolic encephalopathy: Secondary | ICD-10-CM

## 2018-09-25 LAB — CBC WITH DIFFERENTIAL/PLATELET
Abs Immature Granulocytes: 0 10*3/uL (ref 0.0–0.1)
Basophils Absolute: 0 10*3/uL (ref 0.0–0.1)
Basophils Relative: 1 %
EOS ABS: 0.2 10*3/uL (ref 0.0–0.7)
Eosinophils Relative: 4 %
HCT: 33.1 % — ABNORMAL LOW (ref 39.0–52.0)
Hemoglobin: 10.7 g/dL — ABNORMAL LOW (ref 13.0–17.0)
IMMATURE GRANULOCYTES: 0 %
LYMPHS ABS: 0.7 10*3/uL (ref 0.7–4.0)
Lymphocytes Relative: 17 %
MCH: 29.3 pg (ref 26.0–34.0)
MCHC: 32.3 g/dL (ref 30.0–36.0)
MCV: 90.7 fL (ref 78.0–100.0)
Monocytes Absolute: 0.4 10*3/uL (ref 0.1–1.0)
Monocytes Relative: 9 %
NEUTROS PCT: 69 %
Neutro Abs: 2.9 10*3/uL (ref 1.7–7.7)
Platelets: 192 10*3/uL (ref 150–400)
RBC: 3.65 MIL/uL — ABNORMAL LOW (ref 4.22–5.81)
RDW: 14.4 % (ref 11.5–15.5)
WBC: 4.2 10*3/uL (ref 4.0–10.5)

## 2018-09-25 LAB — RENAL FUNCTION PANEL
ANION GAP: 10 (ref 5–15)
Albumin: 3 g/dL — ABNORMAL LOW (ref 3.5–5.0)
BUN: 21 mg/dL (ref 8–23)
CHLORIDE: 104 mmol/L (ref 98–111)
CO2: 24 mmol/L (ref 22–32)
CREATININE: 1.5 mg/dL — AB (ref 0.61–1.24)
Calcium: 8.7 mg/dL — ABNORMAL LOW (ref 8.9–10.3)
GFR, EST AFRICAN AMERICAN: 43 mL/min — AB (ref >60)
GFR, EST NON AFRICAN AMERICAN: 38 mL/min — AB (ref >60)
Glucose, Bld: 79 mg/dL (ref 70–99)
Phosphorus: 3.8 mg/dL (ref 2.5–4.6)
Potassium: 3.8 mmol/L (ref 3.5–5.1)
Sodium: 138 mmol/L (ref 135–145)

## 2018-09-25 MED ORDER — FUROSEMIDE 20 MG PO TABS: 20.0000 mg | ORAL_TABLET | ORAL | 0 refills | Status: DC

## 2018-09-25 MED ORDER — AMOXICILLIN-POT CLAVULANATE 500-125 MG PO TABS: 1.0000 | ORAL_TABLET | Freq: Two times a day (BID) | ORAL | 0 refills | Status: AC

## 2018-09-25 NOTE — Progress Notes (Signed)
Discharge instructions, RX's and follow up appts explained and provided to patient and his son verbalized understanding. patient left floor via wheelchair accompanied by staff no c/o pain or shortness of breath.  Nafisa Olds, Tivis Ringer, RN

## 2018-09-25 NOTE — Evaluation (Signed)
Physical Therapy Evaluation Patient Details Name: Micheal Nicholson MRN: [id number] DOB: [birth year] Today's Date: 09/25/2018   History of Present Illness  84 year old male, lives alone, ambulates at home with the help of a Rollator or cane, still drives and mows his lawn, has a caregiver/aide that comes in for 4 hours daily for 5 days of the week, has family living close by that supervise and assist him as needed, PMH of HTN, CVA with left-sided weakness, GERD, depression, anxiety, BPH, chronic systolic CHF with EF of 70%, PPM placement, stage III CKD, A. fib not on anticoagulants who presented to ED on 09/23/2018 due to cough and confusion.  He has also had chronic fleeting several joint pains ongoing for some time now.  Evaluation in ED showed WBC 4.8, BNP 803, positive d-dimer 1.36, afebrile, mild tachypnea but not hypoxic on room air, chest x-ray suggested interstitial pulmonary edema but CTA chest was negative for PE and showed multifocal patchy infiltration particularly in the right middle and lower lobes concerning for pneumonia.    Clinical Impression  Pt admitted with above diagnosis. Pt currently with functional limitations due to the deficits listed below (see PT Problem List). Pt ambulating close to baseline, SpO2 WNL on RA during session. Patient safest with supervision for mobility, son not present to confirm level of assistance at home. If able to have supervision pt could continue therapy at home. utilzing RW ambulated 200' no overt LOB.  Pt will benefit from skilled PT to increase their independence and safety with mobility to allow discharge to the venue listed below.       Follow Up Recommendations Supervision for mobility/OOB;Home health PT    Equipment Recommendations  Rolling walker with 5" wheels    Recommendations for Other Services       Precautions / Restrictions Precautions Precautions: Fall Restrictions Weight Bearing Restrictions: No      Mobility  Bed  Mobility Overal bed mobility: Modified Independent                Transfers Overall transfer level: Needs assistance Equipment used: Rolling walker (2 wheeled) Transfers: Sit to/from Stand Sit to Stand: Min guard            Ambulation/Gait Ambulation/Gait assistance: Supervision Gait Distance (Feet): 150 Feet Assistive device: Rolling walker (2 wheeled) Gait Pattern/deviations: Step-to pattern Gait velocity: decreased   General Gait Details: SpO2 WNL on RA, pt without overt LOB with mild unsteadiness noted. Supervision level  Stairs            Wheelchair Mobility    Modified Rankin (Stroke Patients Only)       Balance Overall balance assessment: Needs assistance   Sitting balance-Leahy Scale: Good                                       Pertinent Vitals/Pain Pain Assessment: No/denies pain    Home Living Family/patient expects to be discharged to:: Private residence Living Arrangements: Alone;Other (Comment)(with HH) Available Help at Discharge: Family;Available PRN/intermittently Type of Home: House Home Access: Level entry     Home Layout: One level Home Equipment: Walker - 2 wheels;Shower seat      Prior Function Level of Independence: Independent with assistive device(s)         Comments: pt ambulates with RW     Hand Dominance        Extremity/Trunk Assessment   Upper Extremity  Assessment Upper Extremity Assessment: Overall WFL for tasks assessed    Lower Extremity Assessment Lower Extremity Assessment: Overall WFL for tasks assessed       Communication   Communication: HOH  Cognition Arousal/Alertness: Awake/alert Behavior During Therapy: WFL for tasks assessed/performed                                          General Comments      Exercises     Assessment/Plan    PT Assessment Patient needs continued PT services  PT Problem List Decreased strength;Decreased  balance;Cardiopulmonary status limiting activity;Decreased activity tolerance;Decreased mobility;Decreased cognition;Decreased safety awareness       PT Treatment Interventions DME instruction;Gait training;Stair training;Functional mobility training;Therapeutic exercise;Therapeutic activities;Balance training    PT Goals (Current goals can be found in the Care Plan section)  Acute Rehab PT Goals Patient Stated Goal: go home PT Goal Formulation: With patient Time For Goal Achievement: 09/29/18 Potential to Achieve Goals: Fair    Frequency Min 3X/week   Barriers to discharge Decreased caregiver support      Co-evaluation               AM-PAC PT "6 Clicks" Daily Activity  Outcome Measure Difficulty turning over in bed (including adjusting bedclothes, sheets and blankets)?: None Difficulty moving from lying on back to sitting on the side of the bed? : None Difficulty sitting down on and standing up from a chair with arms (e.g., wheelchair, bedside commode, etc,.)?: Unable Help needed moving to and from a bed to chair (including a wheelchair)?: A Little Help needed walking in hospital room?: A Little Help needed climbing 3-5 steps with a railing? : A Little 6 Click Score: 18    End of Session Equipment Utilized During Treatment: Gait belt Activity Tolerance: Patient tolerated treatment well Patient left: in bed;with call bell/phone within reach;with bed alarm set Nurse Communication: Mobility status PT Visit Diagnosis: Unsteadiness on feet (R26.81);Muscle weakness (generalized) (M62.81);Difficulty in walking, not elsewhere classified (R26.2)    Time: 1335-1405 PT Time Calculation (min) (ACUTE ONLY): 30 min   Charges:   PT Evaluation $PT Eval Low Complexity: 1 Low PT Treatments $Gait Training: 8-22 mins        Reinaldo Berber, PT, DPT Acute Rehabilitation Services Pager: [phone] Office: [phone]    Reinaldo Berber 09/25/2018, 3:48 PM

## 2018-09-25 NOTE — Consult Note (Signed)
   [facility] CM Inpatient Consult   09/25/2018  Micheal Nicholson [birth year] [id number]  Patient screened for potential [place] [facility] services. Patient is in the [place] of the [place] [facility] services under patient's [facility] plan.  Met with the patient, Micheal Nicholson hard of hearing] and son Micheal Nicholson at the bedside to discuss [place] [facility] for community follow up.  Patient endorses Dr. Carolann Littler as his primary care provider.  This office is listed to provide the post hospital transition of care calls and follow up.  Patient states his daughter-in-law is a Marine scientist who works weekends and she does his pill box weekly.  He is able to take his own medications.  He denies transportation issues, states, "I don't drive anymore that would be too dangerous." He denies any food resource needs states, "my friends and family keeps my refrigerator full and every Wednesday night the church sends me a hot dinner."   Son states that they provide all of those needs for follow up. A brochure and 24 hour nurse advise line magnet given with contact information. No needs at this time.  He states he can not hear on the phone so did not want EMMI calls.  For questions contact:   Natividad Brood, RN BSN [facility] Liaison  [phone] business mobile phone Toll free office [phone]  I

## 2018-09-25 NOTE — Care Management Note (Signed)
Case Management Note  Patient Details  Name: Micheal Nicholson MRN: [id number] Date of Birth: 05/14/1920  Subjective/Objective:   CHF,  HTN, anxiety                Action/Plan: NCM spoke to pt and pt's son, [name] at bedside. Pt active with [facility] for [place], [place] and PT. He has a aide that comes from [place] for 4 hours each day. Has RW, and cane at home. Contacted [place] with resumption of care order.   Expected Discharge Date:  09/24/18               Expected Discharge Plan:  [place]  In-House Referral:  NA  Discharge planning Services  CM Consult  Post Acute Care Choice:  Home Health Choice offered to:  Adult Children  DME Arranged:  N/A DME Agency:  NA  HH Arranged:  RN, PT, OT HH Agency:  [facility]  Status of Service:  Completed, signed off  If discussed at [employer], dates discussed:    Additional Comments:  Erenest Rasher, RN 09/25/2018, 12:32 PM

## 2018-09-25 NOTE — Discharge Summary (Signed)
Physician Discharge Summary  Micheal Nicholson VOH:[id number] DOB: 02-May-1920  PCP: [name], MD  Admit date: 09/23/2018 Discharge date: 09/25/2018  Recommendations for Outpatient Follow-up:  1. Dr. Carolann Littler, PCP in 1 week with repeat labs (CBC & BMP). 2. Recommend repeating chest x-ray in 4 weeks to insure resolution of pneumonia findings. 3. Recommend outpatient palliative care consultation and follow-up.  Home Health: Resumed prior home health services with [facility] (RN, PT and OT). Equipment/Devices: None.  Patient has a Rollator and cane at home.  Discharge Condition: Improved and stable CODE STATUS: Full Diet recommendation: Heart healthy diet.  Discharge Diagnoses:  Principal Problem:   Acute on chronic systolic CHF (congestive heart failure) (HCC) Active Problems:   Anxiety state   Essential hypertension   Benign prostatic hyperplasia   Paroxysmal atrial fibrillation (HCC)   H/O: stroke   CKD (chronic kidney disease), stage III (HCC)   Acute metabolic encephalopathy   Aspiration into airway   Brief Summary: 84 year old male, lives alone, ambulates at home with the help of a Rollator or cane, still drives and mows his lawn, has a caregiver/aide that comes in for 4 hours daily for 5 days of the week, has family living close by that supervise and assist him as needed, PMH of HTN, CVA with left-sided weakness, GERD, depression, anxiety, BPH, chronic systolic CHF with EF of 69%, PPM placement, stage III CKD, A. fib not on anticoagulants who presented to ED on 09/23/2018 due to cough and confusion.  He has also had chronic fleeting several joint pains ongoing for some time now.  Evaluation in ED showed WBC 4.8, BNP 803, positive d-dimer 1.36, afebrile, mild tachypnea but not hypoxic on room air, chest x-ray suggested interstitial pulmonary edema but CTA chest was negative for PE and showed multifocal patchy infiltration particularly in the right middle and  lower lobes concerning for pneumonia.  Assessment and plan:  1. Acute on chronic systolic CHF: As evidenced by dyspnea, positive JVD, elevated BNP and interstitial pulmonary edema on chest x-ray.  TTE 09/15/2018: LVEF 20%.  Briefly treated with IV Lasix 20 mg twice daily.  -2.29 L since admission and clinically euvolemic.  IV Lasix was discontinued.  Discharge on low-dose of Lasix 20 mg every other day with close outpatient follow-up and adjust Lasix dose as needed.  Improved and stable. 2. Suspected aspiration pneumonia: Although initially diagnosed with acute on chronic systolic CHF, upon careful review, his presentation may be multifactorial related to decompensated CHF and aspiration pneumonia from dysphagia.  It is also possible that all of his imaging findings are related to asymmetric pulmonary edema but pneumonia cannot be definitely excluded.  He has completed 3 days of antibiotics in the hospital and will be discharged on additional 4 days to complete total 7 days course.  Recommend repeating chest x-ray to ensure resolution of pneumonia findings. 3. Dysphagia: Speech therapy evaluated.  Remains at high risk for recurrent aspiration.  Recommended regular diet and thin liquids based on patient/family's preference. 4. Essential hypertension: Controlled.  Continue prior home medications. 5. Anxiety: Continue prior home dose of Xanax. 6. BPH: Continue Flomax. 7. Paroxysmal A. Fib: CHA2DS2-VASc Score is 6, needs oral anticoagulation, but patient is not on AC at home, not sure why pt is not on [facility], but possibly due to high risk of a fall given advanced age.  Currently V paced.  Continue carvedilol. 8. History of CVA with residual left hemiparesis: Patient not on aspirin or anticoagulation as outpatient.  Defer aspirin decision to his PCP/cardiology. 9. Stage III chronic kidney disease: Baseline creatinine between 1.4-1.6.  Creatinine at baseline.  Follow BMP periodically as outpatient. 10. Acute  metabolic encephalopathy: Likely related to acute illness.  As per son at bedside, mental status changes have resolved and patient is back to baseline. 11. Adult failure to thrive: Multifactorial related to very advanced age, frail physical status and multiple severe significant comorbidities.  Patient declines SNF and insists on going home and the son is aware.  Patient also does not like modified diet and prefers to be on a regular diet.  Consider outpatient palliative care consultation for goals of care. 12. Normocytic anemia: Stable.   Consultations:  None  Procedures:  Bilateral lower extremity venous Doppler 09/24/2018:  Final Interpretation: Right: There is no evidence of deep vein thrombosis in the lower extremity. No cystic structure found in the popliteal fossa. Left: There is no evidence of deep vein thrombosis in the lower extremity. No cystic structure found in the popliteal fossa.   Discharge Instructions  Discharge Instructions    (HEART FAILURE PATIENTS) Call MD:  Anytime you have any of the following symptoms: 1) 3 pound weight gain in 24 hours or 5 pounds in 1 week 2) shortness of breath, with or without a dry hacking cough 3) swelling in the hands, feet or stomach 4) if you have to sleep on extra pillows at night in order to breathe.   Complete by:  As directed    Call MD for:  difficulty breathing, headache or visual disturbances   Complete by:  As directed    Call MD for:  extreme fatigue   Complete by:  As directed    Call MD for:  persistant dizziness or light-headedness   Complete by:  As directed    Call MD for:  severe uncontrolled pain   Complete by:  As directed    Call MD for:  temperature >100.4   Complete by:  As directed    Diet - low sodium heart healthy   Complete by:  As directed    Increase activity slowly   Complete by:  As directed        Medication List    TAKE these medications   ALPRAZolam 0.5 MG tablet Commonly known as:   XANAX TAKE 1 TABLET BY MOUTH TWICE A DAY AS NEEDED FOR ANXIETY What changed:    how much to take  how to take this  when to take this  additional instructions   amoxicillin-clavulanate 500-125 MG tablet Commonly known as:  AUGMENTIN Take 1 tablet (500 mg total) by mouth 2 (two) times daily for 4 days. Start taking on:  09/26/2018   carvedilol 6.25 MG tablet Commonly known as:  COREG Take 3.125 mg by mouth 2 (two) times daily with a meal.   docusate sodium 100 MG capsule Commonly known as:  COLACE Take 100 mg by mouth 2 (two) times daily.   fluticasone 50 MCG/ACT nasal spray Commonly known as:  FLONASE Place 2 sprays into both nostrils 2 (two) times daily as needed for rhinitis.   furosemide 20 MG tablet Commonly known as:  LASIX Take 1 tablet (20 mg total) by mouth every other day.   omeprazole 20 MG capsule Commonly known as:  PRILOSEC TAKE 1 CAPSULE (20 MG TOTAL) BY MOUTH DAILY AS NEEDED (FOR ACID REFLEX).   pramipexole 0.125 MG tablet Commonly known as:  MIRAPEX TAKE 2 TABLETS BY MOUTH AT NIGHT FOR RESTLESS LEGS  What changed:  See the new instructions.   sertraline 100 MG tablet Commonly known as:  ZOLOFT TAKE 1/2 TABLET DAILY NEEDS OFFICE VISIT What changed:    how much to take  how to take this  when to take this  additional instructions   tamsulosin 0.4 MG Caps capsule Commonly known as:  FLOMAX Take 0.4 mg by mouth daily.   traMADol 50 MG tablet Commonly known as:  ULTRAM Take 50 mg by mouth every 6 (six) hours as needed (pain).      Follow-up Information    Burchette, Alinda Sierras, MD. Schedule an appointment as soon as possible for a visit in 1 week(s).   Specialty:  Family Medicine Why:  To be seen with repeat labs (CBC & BMP). Contact information: [place] [address] [phone]        Home, [facility] Follow up.   Specialty:  Home Health Services Why:  Home Health RN, Physical Therapy, Occupational  Therapy-agency will call to arrange initial visit Contact information: [place] [address] [phone]          Allergies  Allergen Reactions  . Chocolate Other (See Comments)    Acid reflux       Procedures/Studies: Dg Chest 2 View  Result Date: 09/25/2018 CLINICAL DATA:  Show shortness of breath, pneumonia EXAM: CHEST - 2 VIEW COMPARISON:  09/23/2018 FINDINGS: Left pacer remains in place, unchanged. Cardiomegaly. Patchy bilateral airspace opacities, most confluent in the lower lobes. No visible effusions or acute bony abnormality. IMPRESSION: Patchy bilateral airspace opacities, most confluent in the lower lobes concerning for pneumonia. Mild cardiomegaly. Electronically Signed   By: Rolm Baptise M.D.   On: 09/25/2018 08:29   Dg Chest 2 View  Result Date: 09/23/2018 CLINICAL DATA:  Cough EXAM: CHEST - 2 VIEW COMPARISON:  09/14/2018, 07/19/2018 FINDINGS: Cardiac enlargement.  Single lead pacemaker unchanged. Bibasilar airspace densities have improved in the interval. This may have been interstitial edema on the prior study. No effusion. No definite superimposed pneumonia. Right lower lobe airspace disease is unchanged from 07/19/2018. Atherosclerotic aorta. IMPRESSION: Improvement in interstitial edema since prior study. Right lower lobe atelectasis/scarring. No acute pneumonia. Electronically Signed   By: Franchot Gallo M.D.   On: 09/23/2018 16:49   Ct Angio Chest Pe W/cm &/or Wo Cm  Result Date: 09/24/2018 CLINICAL DATA:  Leg swelling, elevated D-dimer EXAM: CT ANGIOGRAPHY CHEST WITH CONTRAST TECHNIQUE: Multidetector CT imaging of the chest was performed using the standard protocol during bolus administration of intravenous contrast. Multiplanar CT image reconstructions and MIPs were obtained to evaluate the vascular anatomy. CONTRAST:  160mL ISOVUE-370 IOPAMIDOL (ISOVUE-370) INJECTION 76% COMPARISON:  Chest radiographs dated 09/23/2018. CT chest dated  03/03/2016. FINDINGS: Cardiovascular: Evaluation of the bilateral pulmonary arteries is mildly constrained by respiratory motion. Satisfactory opacification the bilateral pulmonary artery to the lobar level. No evidence pulmonary embolism. No evidence of thoracic aortic aneurysm. Atherosclerotic calcifications of the aortic arch. The heart is mildly enlarged.  No pericardial effusion. Coronary atherosclerosis the LAD and right coronary artery. Mediastinum/Nodes: Mediastinal lymphadenopathy, including a dominant 16 mm subcarinal node, grossly unchanged and likely reactive. Visualized thyroid is unremarkable. Lungs/Pleura: Mild patchy opacity in the right middle lobe and right lower lobe, favoring mild pneumonia, less likely atelectasis. Additional faint ground-glass opacity in the posterior right upper lobe and anterior left upper lobe, favoring additional infection. No suspicious pulmonary nodules. No frank interstitial edema. No pleural effusion or pneumothorax. Upper Abdomen:  Visualized upper abdomen is notable for 3.2 cm right upper pole renal cyst with thin calcified septations (series 5/image 87), grossly unchanged from 2017. Vascular calcifications. Musculoskeletal: Degenerative changes of the visualized thoracolumbar spine. Review of the MIP images confirms the above findings. IMPRESSION: No evidence of pulmonary embolism. Mild multifocal patchy opacities, predominantly in the right middle and lower lobes, suspicious for pneumonia. No frank interstitial edema.  No pleural effusions. Mediastinal lymphadenopathy, grossly unchanged, likely reactive. Aortic Atherosclerosis (ICD10-I70.0). Electronically Signed   By: Julian Hy M.D.   On: 09/24/2018 00:10   Dg Swallowing Func-speech Pathology  Result Date: 09/24/2018 Objective Swallowing Evaluation: Type of Study: MBS-Modified Barium Swallow Study  Patient Details Name: Micheal Nicholson MRN: [id number] Date of Birth: 1920/04/07 Today's Date: 09/24/2018  Time: SLP Start Time (ACUTE ONLY): 9163 -SLP Stop Time (ACUTE ONLY): 1216 SLP Time Calculation (min) (ACUTE ONLY): 22 min Past Medical History: Past Medical History: Diagnosis Date . Anxiety  . Arthritis  . BPH (benign prostatic hyperplasia)  . CHF (congestive heart failure) ([place])  . Chronic combined systolic and diastolic CHF, NYHA class 2 ([place]) 04/17/2015 . GERD (gastroesophageal reflux disease)  . Hypertension  . Stroke ([place])  . Symptomatic sinus bradycardia   PPM Dr. Rayann Heman, 01/13/16, STJ device Past Surgical History: Past Surgical History: Procedure Laterality Date . CARDIAC CATHETERIZATION  2006  Negative / 4 years ago . EP IMPLANTABLE DEVICE N/A 01/13/2016  Procedure: Pacemaker Implant;  Surgeon: Thompson Grayer, MD;  Location: [facility] [facility];  Service: Cardiovascular;  Laterality: N/A; HPI: Micheal Nicholson is a 84 y.o. male with medical history significant of dysphagia, hypertension, stroke with left-sided weakness, GERD, depression, anxiety, BPH, CHF, PPM placement, CKD 3, atrial fibrillation who presented to ED yesterday with cough, chese pain, and confusion. He lives at home alone (has home health nurse) and was recently d/c (on 9/23) from another admission to [facility]. CXR revealed improvement in interstitial edema since prior study (9/21); right lower lobe atelectasis/scarring; no acute pneumonia.  Subjective: The patient was seen sitting on the side of his bed.  Assessment / Plan / Recommendation CHL IP CLINICAL IMPRESSIONS 09/24/2018 Clinical Impression Pt presented with severe pharyngeal dysphagia marked by decreased sensimotor impairments exacerbated what appears to be cervical/ pharyngeal rigidity resulting in silent aspiration of thin and honey thick liquids, moderate vallecular/pharyngeal residue, and delayed swallow initiation to the valleculae and pyriform sinuses. Mild lingual pumping was also noted across all consistencies except thin. Silent penetration to the vocal folds followed by  aspiration during the swallow with thin observed. Throughout study, pt exhibited moderate vallecular/pharyngeal residue which was subsequently aspirated without sensation with honey thick and thin. Silent penetration to the vocal folds from nectar thick residue was cleared from vestibule via cued cough. No aiway intrusion was noted with deglutation of puree or regular POs, although post-swallow pharyngeal residue proved to be problematic, as previously described. Supraglottic swallow strategy was not effective to provide additional airway protection, however cued dry swallows assisted in clearance of vallecular/pharyngeal residue. SLP discussed results with pt's son who verbalizes understanding risk associated with chronic aspiration and wishes to continue PO intake (SLP spoke with MD as well). Recommend continue regular diet with thin liquids, meds whole in applesauce as an added precaution, minimize environmental distractions take small bites/sips, multiple dry swallows and hard volitional cough following swallow. ST will continue to provide treatment with diet safety and efficiency, further pt/family education, and training with safe swallow compensatory stratgies.  SLP Visit Diagnosis Dysphagia, oropharyngeal  phase (R13.12) Attention and concentration deficit following -- Frontal lobe and executive function deficit following -- Impact on safety and function Severe aspiration risk   CHL IP TREATMENT RECOMMENDATION 09/24/2018 Treatment Recommendations Therapy as outlined in treatment plan below   Prognosis 09/24/2018 Prognosis for Safe Diet Advancement Fair Barriers to Reach Goals Severity of deficits Barriers/Prognosis Comment -- CHL IP DIET RECOMMENDATION 09/24/2018 SLP Diet Recommendations Thin liquid;Regular solids Liquid Administration via Cup Medication Administration Whole meds with puree Compensations Minimize environmental distractions;Slow rate;Small sips/bites;Hard cough after swallow;Multiple dry swallows  after each bite/sip Postural Changes Seated upright at 90 degrees   CHL IP OTHER RECOMMENDATIONS 09/24/2018 Recommended Consults -- Oral Care Recommendations Oral care before and after PO;Oral care BID Other Recommendations --   CHL IP FOLLOW UP RECOMMENDATIONS 09/24/2018 Follow up Recommendations Skilled Nursing facility   [facility] IP FREQUENCY AND DURATION 09/24/2018 Speech Therapy Frequency (ACUTE ONLY) min 2x/week Treatment Duration 2 weeks      CHL IP ORAL PHASE 09/24/2018 Oral Phase Impaired Oral - Pudding Teaspoon -- Oral - Pudding Cup -- Oral - Honey Teaspoon -- Oral - Honey Cup Lingual pumping;Delayed oral transit Oral - Nectar Teaspoon -- Oral - Nectar Cup Lingual pumping;Delayed oral transit Oral - Nectar Straw -- Oral - Thin Teaspoon -- Oral - Thin Cup -- Oral - Thin Straw -- Oral - Puree Lingual pumping;Delayed oral transit Oral - Mech Soft -- Oral - Regular Lingual pumping;Delayed oral transit Oral - Multi-Consistency -- Oral - Pill -- Oral Phase - Comment --  CHL IP PHARYNGEAL PHASE 09/24/2018 Pharyngeal Phase Impaired Pharyngeal- Pudding Teaspoon -- Pharyngeal -- Pharyngeal- Pudding Cup -- Pharyngeal -- Pharyngeal- Honey Teaspoon -- Pharyngeal -- Pharyngeal- Honey Cup Delayed swallow initiation-vallecula;Delayed swallow initiation-pyriform sinuses;Penetration/Apiration after swallow;Pharyngeal residue - valleculae;Pharyngeal residue - pyriform;Pharyngeal residue - posterior pharnyx;Compensatory strategies attempted (with notebox) Pharyngeal Material enters airway, passes BELOW cords without attempt by patient to eject out (silent aspiration) Pharyngeal- Nectar Teaspoon -- Pharyngeal -- Pharyngeal- Nectar Cup Delayed swallow initiation-pyriform sinuses;Penetration/Aspiration during swallow;Pharyngeal residue - valleculae;Pharyngeal residue - pyriform;Pharyngeal residue - posterior pharnyx;Compensatory strategies attempted (with notebox) Pharyngeal Material enters airway, CONTACTS cords and not ejected out  Pharyngeal- Nectar Straw -- Pharyngeal -- Pharyngeal- Thin Teaspoon -- Pharyngeal -- Pharyngeal- Thin Cup Penetration/Aspiration during swallow;Penetration/Apiration after swallow;Pharyngeal residue - valleculae;Pharyngeal residue - posterior pharnyx;Pharyngeal residue - pyriform Pharyngeal Material enters airway, passes BELOW cords without attempt by patient to eject out (silent aspiration) Pharyngeal- Thin Straw -- Pharyngeal -- Pharyngeal- Puree Pharyngeal residue - valleculae;Pharyngeal residue - pyriform;Pharyngeal residue - posterior pharnyx Pharyngeal -- Pharyngeal- Mechanical Soft -- Pharyngeal -- Pharyngeal- Regular Pharyngeal residue - valleculae Pharyngeal -- Pharyngeal- Multi-consistency -- Pharyngeal -- Pharyngeal- Pill -- Pharyngeal -- Pharyngeal Comment --  CHL IP CERVICAL ESOPHAGEAL PHASE 09/24/2018 Cervical Esophageal Phase WFL Pudding Teaspoon -- Pudding Cup -- Honey Teaspoon -- Honey Cup -- Nectar Teaspoon -- Nectar Cup -- Nectar Straw -- Thin Teaspoon -- Thin Cup -- Thin Straw -- Puree -- Mechanical Soft -- Regular -- Multi-consistency -- Pill -- Cervical Esophageal Comment -- Micheal Nicholson 09/24/2018, 2:32 PM Micheal Nicholson M.Ed Actor Pager [phone] Office [phone]                 Subjective: Patient interviewed and examined in the presence of his son at bedside.  Hard of hearing.  Denies complaints today.  No pain reported.  Denies dyspnea.  No chest pain or cough.  As per son, mental status is back to his baseline.  He also  indicates that patient refuses SNF.  Discharge Exam:  Vitals:   09/24/18 1944 09/25/18 0045 09/25/18 0532 09/25/18 1217  BP: 112/72 115/68 106/69 103/72  Pulse: 61 66 63 63  Resp: 16 18 18 20   Temp: 97.9 F (36.6 C) 97.8 F (36.6 C) 98 F (36.7 C)   TempSrc: Oral Oral Oral   SpO2: 96% 96% 96% 96%  Weight:   61.4 kg   Height:        General: Pleasant elderly male, moderately built, thinly nourished/fail lying  comfortably propped up in bed without distress. Cardiovascular: S1 & S2 heard, RRR, S1/S2 +. No murmurs, rubs, gallops or clicks. No JVD or pedal edema.  Telemetry personally reviewed: V paced rhythm. Respiratory: Occasional bibasilar crackles but otherwise clear to auscultation. No increased work of breathing. Abdominal:  Non distended, non tender & soft. No organomegaly or masses appreciated. Normal bowel sounds heard. CNS: Alert and oriented x2. No focal deficits.  Hard of hearing. Extremities: no edema, no cyanosis    The results of significant diagnostics from this hospitalization (including imaging, microbiology, ancillary and laboratory) are listed below for reference.     Microbiology: Recent Results (from the past 240 hour(s))  Culture, blood (Routine X 2) w Reflex to ID Panel     Status: None (Preliminary result)   Collection Time: 09/24/18  3:05 AM  Result Value Ref Range Status   Specimen Description BLOOD RIGHT ANTECUBITAL  Final   Special Requests   Final    BOTTLES DRAWN AEROBIC ONLY Blood Culture results may not be optimal due to an excessive volume of blood received in culture bottles   Culture   Final    NO GROWTH 1 DAY Performed at [facility] [address], [place], [place] [address]    Report Status PENDING  Incomplete  Culture, blood (Routine X 2) w Reflex to ID Panel     Status: None (Preliminary result)   Collection Time: 09/24/18  3:05 AM  Result Value Ref Range Status   Specimen Description BLOOD RIGHT ANTECUBITAL  Final   Special Requests   Final    BOTTLES DRAWN AEROBIC ONLY Blood Culture adequate volume   Culture   Final    NO GROWTH 1 DAY Performed at [facility], [place] [address] [place], [place] [address]    Report Status PENDING  Incomplete     Labs: CBC: Recent Labs  Lab 09/23/18 1619 09/25/18 0407  WBC 4.8 4.2  NEUTROABS 3.7 2.9  HGB 10.7* 10.7*  HCT 34.1* 33.1*  MCV 93.9 90.7  PLT 213 062   Basic Metabolic  Panel: Recent Labs  Lab 09/23/18 1619 09/25/18 0407  NA 141 138  K 4.7 3.8  CL 105 104  CO2 23 24  GLUCOSE 105* 79  BUN 21 21  CREATININE 1.46* 1.50*  CALCIUM 9.2 8.7*  PHOS  --  3.8   Liver Function Tests: Recent Labs  Lab 09/23/18 1619 09/25/18 0407  AST 28  --   ALT 15  --   ALKPHOS 63  --   BILITOT 1.5*  --   PROT 6.4*  --   ALBUMIN 3.5 3.0*   BNP (last 3 results) Recent Labs    09/15/18 0504 09/16/18 0744 09/23/18 1620  BNP 1,147.5* 722.7* 803.0*   Cardiac Enzymes: Recent Labs  Lab 09/24/18 0305 09/24/18 0724 09/24/18 1401  TROPONINI <0.03 <0.03 <0.03   Urinalysis    Component Value Date/Time   COLORURINE STRAW (A)  09/14/2018 [place] 09/14/2018 1142   LABSPEC 1.005 09/14/2018 1142   PHURINE 7.0 09/14/2018 1142   GLUCOSEU NEGATIVE 09/14/2018 1142   HGBUR NEGATIVE 09/14/2018 1142   BILIRUBINUR NEGATIVE 09/14/2018 1142   BILIRUBINUR negative 11/10/2013 1713   KETONESUR NEGATIVE 09/14/2018 1142   PROTEINUR NEGATIVE 09/14/2018 1142   UROBILINOGEN 1.0 03/14/2015 1608   NITRITE NEGATIVE 09/14/2018 1142   LEUKOCYTESUR NEGATIVE 09/14/2018 1142   Discussed in detail with patient's son at bedside.  Updated care and answered questions.   Time coordinating discharge: 40 minutes  SIGNED:  Vernell Leep, MD, FACP, [facility]. [facility] Pager [phone]  If 7PM-7AM, please contact night-coverage [email or web address] Password [email or web address] 09/25/2018, 2:52 PM

## 2018-09-25 NOTE — Discharge Instructions (Signed)

## 2018-09-26 ENCOUNTER — Telehealth: Payer: Self-pay | Admitting: Family Medicine

## 2018-09-26 ENCOUNTER — Encounter: Payer: Self-pay | Admitting: Family Medicine

## 2018-09-26 NOTE — Telephone Encounter (Signed)
Unable to reach patient at time of TCM Call.  Left message for son to return call when available.

## 2018-09-27 NOTE — Telephone Encounter (Signed)
Transition Care Management Follow-up Telephone Call  Information give by son  Recommendations for Outpatient Follow-up:  1. Dr. Carolann Littler, PCP in 1 week with repeat labs (CBC & BMP). 2. Recommend repeating chest x-ray in 4 weeks to insure resolution of pneumonia findings. 3. Recommend outpatient palliative care consultation and follow-up.  Home Health: Resumed prior home health services with [facility] (RN, PT and OT). Equipment/Devices: None.  Patient has a Rollator and cane at home.  Discharge Condition: Improved and stable CODE STATUS: Full Diet recommendation: Heart healthy diet.  Discharge Diagnoses:  Principal Problem:   Acute on chronic systolic CHF (congestive heart failure) (HCC) Active Problems:   Anxiety state   Essential hypertension   Benign prostatic hyperplasia   Paroxysmal atrial fibrillation (HCC)   H/O: stroke   CKD (chronic kidney disease), stage III (HCC)   Acute metabolic encephalopathy   Aspiration into airway   How have you been since you were released from the hospital? Do a lot better   Do you understand why you were in the hospital? yes   Do you understand the discharge instructions? yes   Where were you discharged to? Home   Items Reviewed:  Medications reviewed: yes  Allergies reviewed: yes  Dietary changes reviewed: yes  Referrals reviewed: yes   Functional Questionnaire:   Activities of Daily Living (ADLs):   He states they are independent in the following: cooking States they require assistance with the following: Daily chores     Any transportation issues/concerns?: no   Any patient concerns? yes   Confirmed importance and date/time of follow-up visits scheduled yes 10/01/18 at 2 pm  Provider Appointment booked with Dr Elease Hashimoto   Confirmed with patient if condition begins to worsen call PCP or go to the ER.  Patient was given the office number and encouraged to call back with question or concerns.  :  no

## 2018-09-28 DIAGNOSIS — D631 Anemia in chronic kidney disease: Secondary | ICD-10-CM | POA: Diagnosis not present

## 2018-09-28 DIAGNOSIS — N183 Chronic kidney disease, stage 3 (moderate): Secondary | ICD-10-CM | POA: Diagnosis not present

## 2018-09-28 DIAGNOSIS — I5043 Acute on chronic combined systolic (congestive) and diastolic (congestive) heart failure: Secondary | ICD-10-CM | POA: Diagnosis not present

## 2018-09-28 DIAGNOSIS — I69318 Other symptoms and signs involving cognitive functions following cerebral infarction: Secondary | ICD-10-CM | POA: Diagnosis not present

## 2018-09-28 DIAGNOSIS — I130 Hypertensive heart and chronic kidney disease with heart failure and stage 1 through stage 4 chronic kidney disease, or unspecified chronic kidney disease: Secondary | ICD-10-CM | POA: Diagnosis not present

## 2018-09-28 DIAGNOSIS — I480 Paroxysmal atrial fibrillation: Secondary | ICD-10-CM | POA: Diagnosis not present

## 2018-09-28 DIAGNOSIS — I69351 Hemiplegia and hemiparesis following cerebral infarction affecting right dominant side: Secondary | ICD-10-CM | POA: Diagnosis not present

## 2018-09-28 DIAGNOSIS — F0150 Vascular dementia without behavioral disturbance: Secondary | ICD-10-CM | POA: Diagnosis not present

## 2018-09-28 DIAGNOSIS — I69354 Hemiplegia and hemiparesis following cerebral infarction affecting left non-dominant side: Secondary | ICD-10-CM | POA: Diagnosis not present

## 2018-09-29 LAB — CULTURE, BLOOD (ROUTINE X 2)
Culture: NO GROWTH
Culture: NO GROWTH
SPECIAL REQUESTS: ADEQUATE

## 2018-09-30 DIAGNOSIS — I130 Hypertensive heart and chronic kidney disease with heart failure and stage 1 through stage 4 chronic kidney disease, or unspecified chronic kidney disease: Secondary | ICD-10-CM | POA: Diagnosis not present

## 2018-09-30 DIAGNOSIS — F0150 Vascular dementia without behavioral disturbance: Secondary | ICD-10-CM | POA: Diagnosis not present

## 2018-09-30 DIAGNOSIS — I69354 Hemiplegia and hemiparesis following cerebral infarction affecting left non-dominant side: Secondary | ICD-10-CM | POA: Diagnosis not present

## 2018-09-30 DIAGNOSIS — I480 Paroxysmal atrial fibrillation: Secondary | ICD-10-CM | POA: Diagnosis not present

## 2018-09-30 DIAGNOSIS — N183 Chronic kidney disease, stage 3 (moderate): Secondary | ICD-10-CM | POA: Diagnosis not present

## 2018-09-30 DIAGNOSIS — I69351 Hemiplegia and hemiparesis following cerebral infarction affecting right dominant side: Secondary | ICD-10-CM | POA: Diagnosis not present

## 2018-09-30 DIAGNOSIS — I5043 Acute on chronic combined systolic (congestive) and diastolic (congestive) heart failure: Secondary | ICD-10-CM | POA: Diagnosis not present

## 2018-09-30 DIAGNOSIS — I69318 Other symptoms and signs involving cognitive functions following cerebral infarction: Secondary | ICD-10-CM | POA: Diagnosis not present

## 2018-09-30 DIAGNOSIS — D631 Anemia in chronic kidney disease: Secondary | ICD-10-CM | POA: Diagnosis not present

## 2018-10-01 ENCOUNTER — Ambulatory Visit (INDEPENDENT_AMBULATORY_CARE_PROVIDER_SITE_OTHER): Payer: Medicare HMO | Admitting: Family Medicine

## 2018-10-01 ENCOUNTER — Telehealth: Payer: Self-pay | Admitting: Family Medicine

## 2018-10-01 ENCOUNTER — Encounter: Payer: Self-pay | Admitting: Family Medicine

## 2018-10-01 ENCOUNTER — Other Ambulatory Visit: Payer: Self-pay

## 2018-10-01 VITALS — BP 112/62 | HR 64 | Wt 136.4 lb

## 2018-10-01 DIAGNOSIS — J189 Pneumonia, unspecified organism: Secondary | ICD-10-CM | POA: Diagnosis not present

## 2018-10-01 DIAGNOSIS — N183 Chronic kidney disease, stage 3 (moderate): Secondary | ICD-10-CM

## 2018-10-01 DIAGNOSIS — I10 Essential (primary) hypertension: Secondary | ICD-10-CM | POA: Diagnosis not present

## 2018-10-01 DIAGNOSIS — I5042 Chronic combined systolic (congestive) and diastolic (congestive) heart failure: Secondary | ICD-10-CM | POA: Diagnosis not present

## 2018-10-01 DIAGNOSIS — I69354 Hemiplegia and hemiparesis following cerebral infarction affecting left non-dominant side: Secondary | ICD-10-CM | POA: Diagnosis not present

## 2018-10-01 DIAGNOSIS — F0150 Vascular dementia without behavioral disturbance: Secondary | ICD-10-CM | POA: Diagnosis not present

## 2018-10-01 DIAGNOSIS — I69318 Other symptoms and signs involving cognitive functions following cerebral infarction: Secondary | ICD-10-CM | POA: Diagnosis not present

## 2018-10-01 DIAGNOSIS — R627 Adult failure to thrive: Secondary | ICD-10-CM | POA: Diagnosis not present

## 2018-10-01 DIAGNOSIS — R1310 Dysphagia, unspecified: Secondary | ICD-10-CM

## 2018-10-01 DIAGNOSIS — I69351 Hemiplegia and hemiparesis following cerebral infarction affecting right dominant side: Secondary | ICD-10-CM | POA: Diagnosis not present

## 2018-10-01 DIAGNOSIS — I130 Hypertensive heart and chronic kidney disease with heart failure and stage 1 through stage 4 chronic kidney disease, or unspecified chronic kidney disease: Secondary | ICD-10-CM | POA: Diagnosis not present

## 2018-10-01 DIAGNOSIS — I480 Paroxysmal atrial fibrillation: Secondary | ICD-10-CM | POA: Diagnosis not present

## 2018-10-01 DIAGNOSIS — D631 Anemia in chronic kidney disease: Secondary | ICD-10-CM | POA: Diagnosis not present

## 2018-10-01 DIAGNOSIS — I5043 Acute on chronic combined systolic (congestive) and diastolic (congestive) heart failure: Secondary | ICD-10-CM | POA: Diagnosis not present

## 2018-10-01 NOTE — Patient Instructions (Signed)
Elevate legs some during the day  Monitor weight and be in touch if weight gain > 3 pounds in one day or 5 pounds in one week.

## 2018-10-01 NOTE — Telephone Encounter (Signed)
Copied from [place] [id number]. Topic: Quick Communication - See Telephone Encounter >> Oct 01, 2018  2:07 PM Rutherford Nail, NT wrote: CRM for notification. See Telephone encounter for: 10/01/18. Adriane with [facility] calling to request verbal orders for continuation of physical therapy 2x a week for 5 weeks. CB#: [phone] (can leave a message)

## 2018-10-01 NOTE — Telephone Encounter (Signed)
Called Micheal Nicholson and gave her the verbal OK per Dr. Elease Hashimoto.

## 2018-10-01 NOTE — Progress Notes (Signed)
Subjective:     Patient ID: Micheal Nicholson, male   DOB: Mar 22, 1920, 84 y.o.   MRN: [id number]  HPI Patient here for hospital follow-up/transitional care visit.  84 year old with multiple medical problems with failure to thrive and gradual decline in overall health for the past few years.  Chronic problems include hypertension, atrial fibrillation, combined systolic and diastolic heart failure, history of CAD, history of CVA, GERD  Was seen recently with increased cough and confusion.  Evaluation ED revealed normal white count.  He had positive d-dimer and an elevated BNP level.  He was afebrile.  Tachycardic but not hypoxic.  Chest x-ray suggested interstitial coronary edema.  CT angiogram negative for PE.  He had multifocal patchy infiltration especially right middle and lower lobes with concern for possible pneumonia.  Was never clear that he had pneumonia but he was treated with broad-spectrum antibiotics.  There was concern for possible aspiration pneumonia.  He had speech therapy evaluation.  Considered at high risk for recurrent aspiration.  Was discharged home on Augmentin.  No cough at this time.  No fever.  Appetite stable  Acute on chronic systolic heart failure.  Patient had TTE 09/15/2018 with ejection fraction 20%.  Remains on Lasix 20 mg every other day.  Edema and weight stable.  He has chronic kidney disease with baseline creatinine around 1.5.  Adult failure to thrive related to age and increased frailty.  We discussed with son today possible referral to palliative care and he is interested in that.  Past Medical History:  Diagnosis Date  . Anxiety   . Arthritis   . BPH (benign prostatic hyperplasia)   . CHF (congestive heart failure) ([place])   . Chronic combined systolic and diastolic CHF, NYHA class 2 ([place]) 04/17/2015  . GERD (gastroesophageal reflux disease)   . Hypertension   . Stroke ([place])   . Symptomatic sinus bradycardia    PPM Dr. Rayann Heman, 01/13/16, STJ device    Past Surgical History:  Procedure Laterality Date  . CARDIAC CATHETERIZATION  2006   Negative / 4 years ago  . EP IMPLANTABLE DEVICE N/A 01/13/2016   Procedure: Pacemaker Implant;  Surgeon: Thompson Grayer, MD;  Location: [facility];  Service: Cardiovascular;  Laterality: N/A;    reports that he has quit smoking. He has a 2.50 pack-year smoking history. He has never used smokeless tobacco. He reports that he drinks about 1.0 standard drinks of alcohol per week. He reports that he does not use drugs. family history includes Healthy in his son; Heart disease in his brother; Heart failure in his son; Lung cancer in his brother; Pancreatic cancer in his son; Pneumonia (age of onset: 50) in his father. Allergies  Allergen Reactions  . Chocolate Other (See Comments)    Acid reflux      Review of Systems  Constitutional: Positive for fatigue. Negative for chills and fever.  Eyes: Negative for visual disturbance.  Respiratory: Negative for cough, chest tightness and shortness of breath.   Cardiovascular: Negative for chest pain and palpitations.  Gastrointestinal: Negative for abdominal pain.  Genitourinary: Negative for dysuria.  Neurological: Negative for dizziness, syncope, light-headedness and headaches.       Objective:   Physical Exam  Constitutional:  Alert somewhat frail appearing 84 year old male  HENT:  Mouth/Throat: Oropharynx is clear and moist.  Cardiovascular: Normal rate.  Pulmonary/Chest: Effort normal and breath sounds normal.  Musculoskeletal:  trace pitting edema legs bilaterally  Neurological: He is alert.  Skin: No rash  noted.       Assessment:     #1 possible community-acquired pneumonia/aspiration pneumonia clinically improved following antibiotics  #2 acute on chronic systolic heart failure with ejection fraction 20%.  Very short term life expectancy with current EF and age.  #3 adult failure to thrive  #4 dysphagia with high risk for  recurrent aspiration  #5 hypertension -controlled  #6 history of chronic anxiety  #7 past history of CVA with some residual mild left hemiparesis  #8 chronic kidney disease stage III    Plan:     -Check basic metabolic panel and CBC -We will follow-up in 3 weeks and plan chest x-ray then to ensure resolution of pneumonia. -Recommend daily weights and be in touch if greater than 3 pound weight gain in 1 day or 5 pounds in 1 week.  They will continue Lasix 20 mg every other day -Recommend palliative care consult and family agrees- order placed.  [name] MD [facility]

## 2018-10-01 NOTE — Telephone Encounter (Signed)
OK 

## 2018-10-01 NOTE — Telephone Encounter (Signed)
Please advise 

## 2018-10-02 ENCOUNTER — Telehealth: Payer: Self-pay | Admitting: Family Medicine

## 2018-10-02 NOTE — Telephone Encounter (Signed)
Copied from [place] [id number]. Topic: Quick Communication - See Telephone Encounter >> Oct 02, 2018  4:40 PM Vernona Rieger wrote: CRM for notification. See Telephone encounter for: 10/02/18.  Mallory, RN with [facility] called to get orders for home health occupational therapy. One week one and two week four. Call back @ [phone]

## 2018-10-02 NOTE — Telephone Encounter (Signed)
Called Mallory and left a detailed voice message and gave the OK per Dr. Elease Hashimoto for the verbal OT.

## 2018-10-02 NOTE — Telephone Encounter (Signed)
Please advise 

## 2018-10-02 NOTE — Telephone Encounter (Signed)
ok 

## 2018-10-03 ENCOUNTER — Ambulatory Visit (INDEPENDENT_AMBULATORY_CARE_PROVIDER_SITE_OTHER): Payer: Medicare HMO | Admitting: *Deleted

## 2018-10-03 ENCOUNTER — Telehealth: Payer: Self-pay

## 2018-10-03 DIAGNOSIS — I441 Atrioventricular block, second degree: Secondary | ICD-10-CM

## 2018-10-03 NOTE — Telephone Encounter (Signed)
LMOVM reminding pt to send remote transmission.   

## 2018-10-07 NOTE — Progress Notes (Signed)
Remote pacemaker transmission.   

## 2018-10-08 ENCOUNTER — Telehealth: Payer: Self-pay | Admitting: Family Medicine

## 2018-10-08 DIAGNOSIS — I69351 Hemiplegia and hemiparesis following cerebral infarction affecting right dominant side: Secondary | ICD-10-CM | POA: Diagnosis not present

## 2018-10-08 DIAGNOSIS — I69354 Hemiplegia and hemiparesis following cerebral infarction affecting left non-dominant side: Secondary | ICD-10-CM | POA: Diagnosis not present

## 2018-10-08 DIAGNOSIS — N183 Chronic kidney disease, stage 3 (moderate): Secondary | ICD-10-CM | POA: Diagnosis not present

## 2018-10-08 DIAGNOSIS — I130 Hypertensive heart and chronic kidney disease with heart failure and stage 1 through stage 4 chronic kidney disease, or unspecified chronic kidney disease: Secondary | ICD-10-CM | POA: Diagnosis not present

## 2018-10-08 DIAGNOSIS — D631 Anemia in chronic kidney disease: Secondary | ICD-10-CM | POA: Diagnosis not present

## 2018-10-08 DIAGNOSIS — I69318 Other symptoms and signs involving cognitive functions following cerebral infarction: Secondary | ICD-10-CM | POA: Diagnosis not present

## 2018-10-08 DIAGNOSIS — I5043 Acute on chronic combined systolic (congestive) and diastolic (congestive) heart failure: Secondary | ICD-10-CM | POA: Diagnosis not present

## 2018-10-08 DIAGNOSIS — F0150 Vascular dementia without behavioral disturbance: Secondary | ICD-10-CM | POA: Diagnosis not present

## 2018-10-08 DIAGNOSIS — I480 Paroxysmal atrial fibrillation: Secondary | ICD-10-CM | POA: Diagnosis not present

## 2018-10-08 NOTE — Telephone Encounter (Signed)
Copied from [place] [phone]. Topic: Quick Communication - Home Health Verbal Orders >> Oct 08, 2018  3:14 PM Rutherford Nail, [place] wrote: Caller/Agency: Melissa with [facility] Number: [phone] Requesting OT/PT/Skilled Nursing/Social Work: Social work for potential placement Frequency:

## 2018-10-08 NOTE — Telephone Encounter (Signed)
Ok for order?  

## 2018-10-08 NOTE — Telephone Encounter (Signed)
Please advise 

## 2018-10-08 NOTE — Telephone Encounter (Signed)
Copied from [place] [phone]. Topic: Quick Communication - See Telephone Encounter >> Oct 08, 2018  2:31 PM Conception Chancy, NT wrote: CRM for notification. See Telephone encounter for: 10/08/18.  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   Cb# [phone]

## 2018-10-09 NOTE — Telephone Encounter (Signed)
Please okay these orders  ?

## 2018-10-09 NOTE — Telephone Encounter (Signed)
Left detailed message on machine with verbal orders for Melissa with [facility]

## 2018-10-10 ENCOUNTER — Encounter: Payer: Self-pay | Admitting: Cardiology

## 2018-10-10 DIAGNOSIS — D631 Anemia in chronic kidney disease: Secondary | ICD-10-CM | POA: Diagnosis not present

## 2018-10-10 DIAGNOSIS — I480 Paroxysmal atrial fibrillation: Secondary | ICD-10-CM | POA: Diagnosis not present

## 2018-10-10 DIAGNOSIS — I69354 Hemiplegia and hemiparesis following cerebral infarction affecting left non-dominant side: Secondary | ICD-10-CM | POA: Diagnosis not present

## 2018-10-10 DIAGNOSIS — I130 Hypertensive heart and chronic kidney disease with heart failure and stage 1 through stage 4 chronic kidney disease, or unspecified chronic kidney disease: Secondary | ICD-10-CM | POA: Diagnosis not present

## 2018-10-10 DIAGNOSIS — I5043 Acute on chronic combined systolic (congestive) and diastolic (congestive) heart failure: Secondary | ICD-10-CM | POA: Diagnosis not present

## 2018-10-10 DIAGNOSIS — F0150 Vascular dementia without behavioral disturbance: Secondary | ICD-10-CM | POA: Diagnosis not present

## 2018-10-10 DIAGNOSIS — I69351 Hemiplegia and hemiparesis following cerebral infarction affecting right dominant side: Secondary | ICD-10-CM | POA: Diagnosis not present

## 2018-10-10 DIAGNOSIS — I69318 Other symptoms and signs involving cognitive functions following cerebral infarction: Secondary | ICD-10-CM | POA: Diagnosis not present

## 2018-10-10 DIAGNOSIS — N183 Chronic kidney disease, stage 3 (moderate): Secondary | ICD-10-CM | POA: Diagnosis not present

## 2018-10-11 ENCOUNTER — Telehealth: Payer: Self-pay | Admitting: *Deleted

## 2018-10-11 DIAGNOSIS — I69351 Hemiplegia and hemiparesis following cerebral infarction affecting right dominant side: Secondary | ICD-10-CM | POA: Diagnosis not present

## 2018-10-11 DIAGNOSIS — N183 Chronic kidney disease, stage 3 (moderate): Secondary | ICD-10-CM | POA: Diagnosis not present

## 2018-10-11 DIAGNOSIS — D631 Anemia in chronic kidney disease: Secondary | ICD-10-CM | POA: Diagnosis not present

## 2018-10-11 DIAGNOSIS — I69318 Other symptoms and signs involving cognitive functions following cerebral infarction: Secondary | ICD-10-CM | POA: Diagnosis not present

## 2018-10-11 DIAGNOSIS — I130 Hypertensive heart and chronic kidney disease with heart failure and stage 1 through stage 4 chronic kidney disease, or unspecified chronic kidney disease: Secondary | ICD-10-CM | POA: Diagnosis not present

## 2018-10-11 DIAGNOSIS — I69354 Hemiplegia and hemiparesis following cerebral infarction affecting left non-dominant side: Secondary | ICD-10-CM | POA: Diagnosis not present

## 2018-10-11 DIAGNOSIS — I5043 Acute on chronic combined systolic (congestive) and diastolic (congestive) heart failure: Secondary | ICD-10-CM | POA: Diagnosis not present

## 2018-10-11 DIAGNOSIS — I480 Paroxysmal atrial fibrillation: Secondary | ICD-10-CM | POA: Diagnosis not present

## 2018-10-11 DIAGNOSIS — F0150 Vascular dementia without behavioral disturbance: Secondary | ICD-10-CM | POA: Diagnosis not present

## 2018-10-11 NOTE — Telephone Encounter (Signed)
FYI

## 2018-10-11 NOTE — Telephone Encounter (Signed)
Copied from [place] [phone]. Topic: General - Other >> Oct 11, 2018  1:51 PM Yvette Rack wrote: Reason for CRM: OT [place] from [facility] [phone] calling to let provider know that he had a fall this morning with no injuries

## 2018-10-12 DIAGNOSIS — I69318 Other symptoms and signs involving cognitive functions following cerebral infarction: Secondary | ICD-10-CM | POA: Diagnosis not present

## 2018-10-12 DIAGNOSIS — I480 Paroxysmal atrial fibrillation: Secondary | ICD-10-CM | POA: Diagnosis not present

## 2018-10-12 DIAGNOSIS — I5043 Acute on chronic combined systolic (congestive) and diastolic (congestive) heart failure: Secondary | ICD-10-CM | POA: Diagnosis not present

## 2018-10-12 DIAGNOSIS — N183 Chronic kidney disease, stage 3 (moderate): Secondary | ICD-10-CM | POA: Diagnosis not present

## 2018-10-12 DIAGNOSIS — I69351 Hemiplegia and hemiparesis following cerebral infarction affecting right dominant side: Secondary | ICD-10-CM | POA: Diagnosis not present

## 2018-10-12 DIAGNOSIS — D631 Anemia in chronic kidney disease: Secondary | ICD-10-CM | POA: Diagnosis not present

## 2018-10-12 DIAGNOSIS — I130 Hypertensive heart and chronic kidney disease with heart failure and stage 1 through stage 4 chronic kidney disease, or unspecified chronic kidney disease: Secondary | ICD-10-CM | POA: Diagnosis not present

## 2018-10-12 DIAGNOSIS — I69354 Hemiplegia and hemiparesis following cerebral infarction affecting left non-dominant side: Secondary | ICD-10-CM | POA: Diagnosis not present

## 2018-10-12 DIAGNOSIS — F0150 Vascular dementia without behavioral disturbance: Secondary | ICD-10-CM | POA: Diagnosis not present

## 2018-10-14 ENCOUNTER — Other Ambulatory Visit: Payer: Self-pay

## 2018-10-14 DIAGNOSIS — R296 Repeated falls: Secondary | ICD-10-CM

## 2018-10-14 DIAGNOSIS — R627 Adult failure to thrive: Secondary | ICD-10-CM

## 2018-10-14 NOTE — Telephone Encounter (Signed)
Order placed and message sent to [facility] and Jiles Crocker.

## 2018-10-15 ENCOUNTER — Telehealth: Payer: Self-pay | Admitting: *Deleted

## 2018-10-15 DIAGNOSIS — N183 Chronic kidney disease, stage 3 (moderate): Secondary | ICD-10-CM | POA: Diagnosis not present

## 2018-10-15 DIAGNOSIS — I69354 Hemiplegia and hemiparesis following cerebral infarction affecting left non-dominant side: Secondary | ICD-10-CM | POA: Diagnosis not present

## 2018-10-15 DIAGNOSIS — D631 Anemia in chronic kidney disease: Secondary | ICD-10-CM | POA: Diagnosis not present

## 2018-10-15 DIAGNOSIS — I5043 Acute on chronic combined systolic (congestive) and diastolic (congestive) heart failure: Secondary | ICD-10-CM | POA: Diagnosis not present

## 2018-10-15 DIAGNOSIS — R627 Adult failure to thrive: Secondary | ICD-10-CM | POA: Diagnosis not present

## 2018-10-15 DIAGNOSIS — I130 Hypertensive heart and chronic kidney disease with heart failure and stage 1 through stage 4 chronic kidney disease, or unspecified chronic kidney disease: Secondary | ICD-10-CM | POA: Diagnosis not present

## 2018-10-15 DIAGNOSIS — F0150 Vascular dementia without behavioral disturbance: Secondary | ICD-10-CM | POA: Diagnosis not present

## 2018-10-15 DIAGNOSIS — I69318 Other symptoms and signs involving cognitive functions following cerebral infarction: Secondary | ICD-10-CM | POA: Diagnosis not present

## 2018-10-15 DIAGNOSIS — I6340 Cerebral infarction due to embolism of unspecified cerebral artery: Secondary | ICD-10-CM | POA: Diagnosis not present

## 2018-10-15 DIAGNOSIS — I69351 Hemiplegia and hemiparesis following cerebral infarction affecting right dominant side: Secondary | ICD-10-CM | POA: Diagnosis not present

## 2018-10-15 DIAGNOSIS — I480 Paroxysmal atrial fibrillation: Secondary | ICD-10-CM | POA: Diagnosis not present

## 2018-10-15 NOTE — Telephone Encounter (Signed)
Copied from [place] [id number]. Topic: Referral - Request for Referral >> Oct 15, 2018  4:22 PM Reyne Dumas L wrote: Has patient seen PCP for this complaint? Yes *If NO, is insurance requiring patient see PCP for this issue before PCP can refer them? Referral for which specialty: Palliative Care Preferred provider/office: [facility],  AttnErline Levine - phone: [phone], fax: [phone] Reason for referral: pt not doing well but wants to remain in his home  Denice Paradise, pt's daughter in law calling in requesting referral.  Denice Paradise can be reached at [phone]

## 2018-10-15 NOTE — Telephone Encounter (Signed)
Referral was made for palliative care back on 10-01-18.  We need to see why they have not been out yet.

## 2018-10-15 NOTE — Telephone Encounter (Signed)
Please advise 

## 2018-10-17 DIAGNOSIS — I5043 Acute on chronic combined systolic (congestive) and diastolic (congestive) heart failure: Secondary | ICD-10-CM | POA: Diagnosis not present

## 2018-10-17 DIAGNOSIS — F0150 Vascular dementia without behavioral disturbance: Secondary | ICD-10-CM | POA: Diagnosis not present

## 2018-10-17 DIAGNOSIS — I69318 Other symptoms and signs involving cognitive functions following cerebral infarction: Secondary | ICD-10-CM | POA: Diagnosis not present

## 2018-10-17 DIAGNOSIS — I480 Paroxysmal atrial fibrillation: Secondary | ICD-10-CM | POA: Diagnosis not present

## 2018-10-17 DIAGNOSIS — D631 Anemia in chronic kidney disease: Secondary | ICD-10-CM | POA: Diagnosis not present

## 2018-10-17 DIAGNOSIS — I130 Hypertensive heart and chronic kidney disease with heart failure and stage 1 through stage 4 chronic kidney disease, or unspecified chronic kidney disease: Secondary | ICD-10-CM | POA: Diagnosis not present

## 2018-10-17 DIAGNOSIS — I69354 Hemiplegia and hemiparesis following cerebral infarction affecting left non-dominant side: Secondary | ICD-10-CM | POA: Diagnosis not present

## 2018-10-17 DIAGNOSIS — I69351 Hemiplegia and hemiparesis following cerebral infarction affecting right dominant side: Secondary | ICD-10-CM | POA: Diagnosis not present

## 2018-10-17 DIAGNOSIS — N183 Chronic kidney disease, stage 3 (moderate): Secondary | ICD-10-CM | POA: Diagnosis not present

## 2018-10-18 ENCOUNTER — Telehealth: Payer: Self-pay

## 2018-10-18 NOTE — Telephone Encounter (Signed)
Received referral for Palliative Care. Phone call placed to patient's daughter in law to schedule visit. Scheduled for Monday 10/21/18.

## 2018-10-21 ENCOUNTER — Other Ambulatory Visit: Payer: Medicare HMO | Admitting: Nurse Practitioner

## 2018-10-21 ENCOUNTER — Other Ambulatory Visit: Payer: Self-pay | Admitting: Family Medicine

## 2018-10-21 ENCOUNTER — Telehealth: Payer: Self-pay

## 2018-10-21 NOTE — Telephone Encounter (Signed)
Received message from daughter in law to reschedule visit from Monday to Wednesday at 3:00pm to 3:30pm. Returned message confirming this change

## 2018-10-22 DIAGNOSIS — I69318 Other symptoms and signs involving cognitive functions following cerebral infarction: Secondary | ICD-10-CM | POA: Diagnosis not present

## 2018-10-22 DIAGNOSIS — I69354 Hemiplegia and hemiparesis following cerebral infarction affecting left non-dominant side: Secondary | ICD-10-CM | POA: Diagnosis not present

## 2018-10-22 DIAGNOSIS — F0150 Vascular dementia without behavioral disturbance: Secondary | ICD-10-CM | POA: Diagnosis not present

## 2018-10-22 DIAGNOSIS — N183 Chronic kidney disease, stage 3 (moderate): Secondary | ICD-10-CM | POA: Diagnosis not present

## 2018-10-22 DIAGNOSIS — I480 Paroxysmal atrial fibrillation: Secondary | ICD-10-CM | POA: Diagnosis not present

## 2018-10-22 DIAGNOSIS — I130 Hypertensive heart and chronic kidney disease with heart failure and stage 1 through stage 4 chronic kidney disease, or unspecified chronic kidney disease: Secondary | ICD-10-CM | POA: Diagnosis not present

## 2018-10-22 DIAGNOSIS — D631 Anemia in chronic kidney disease: Secondary | ICD-10-CM | POA: Diagnosis not present

## 2018-10-22 DIAGNOSIS — I5043 Acute on chronic combined systolic (congestive) and diastolic (congestive) heart failure: Secondary | ICD-10-CM | POA: Diagnosis not present

## 2018-10-22 DIAGNOSIS — I69351 Hemiplegia and hemiparesis following cerebral infarction affecting right dominant side: Secondary | ICD-10-CM | POA: Diagnosis not present

## 2018-10-22 NOTE — Telephone Encounter (Signed)
Last OV 10/01/2018, No future OV  Last filled by historical provider

## 2018-10-22 NOTE — Telephone Encounter (Signed)
Refill OK

## 2018-10-23 ENCOUNTER — Other Ambulatory Visit: Payer: Medicare HMO | Admitting: Nurse Practitioner

## 2018-10-23 NOTE — Progress Notes (Unsigned)
PALLIATIVE CARE CONSULT VISIT   PATIENT NAME: Micheal Nicholson DOB: [birth year] MRN: [id number]  PRIMARY CARE PROVIDER:   [name], MD  REFERRING PROVIDER:  [name], MD [place], [place] [address]  RESPONSIBLE PARTY:     ASSESSMENT:        RECOMMENDATIONS and PLAN:  1.  I spent *** minutes providing this consultation,  from *** to ***. More than 50% of the time in this consultation was spent coordinating communication.   HISTORY OF PRESENT ILLNESS:  Micheal Nicholson is a 84 y.o. year old male with multiple medical problems including ***. Palliative Care was asked to help address goals of care.   CODE STATUS:   PPS: 0% HOSPICE ELIGIBILITY/DIAGNOSIS: TBD  PAST MEDICAL HISTORY:  Past Medical History:  Diagnosis Date  . Anxiety   . Arthritis   . BPH (benign prostatic hyperplasia)   . CHF (congestive heart failure) ([place])   . Chronic combined systolic and diastolic CHF, NYHA class 2 ([place]) 04/17/2015  . GERD (gastroesophageal reflux disease)   . Hypertension   . Stroke ([place])   . Symptomatic sinus bradycardia    PPM Dr. Rayann Heman, 01/13/16, STJ device    SOCIAL HX:  Social History   Tobacco Use  . Smoking status: Former Smoker    Packs/day: 0.50    Years: 5.00    Pack years: 2.50  . Smokeless tobacco: Never Used  Substance Use Topics  . Alcohol use: Yes    Alcohol/week: 1.0 standard drinks    Types: 1 Shots of liquor per week    Comment: occ    ALLERGIES:  Allergies  Allergen Reactions  . Chocolate Other (See Comments)    Acid reflux      PERTINENT MEDICATIONS:  Outpatient Encounter Medications as of 10/23/2018  Medication Sig  . ALPRAZolam (XANAX) 0.5 MG tablet TAKE 1 TABLET BY MOUTH TWICE A DAY AS NEEDED FOR ANXIETY (Patient taking differently: Take 0.5 mg by mouth 2 (two) times daily. FOR ANXIETY)  . carvedilol (COREG) 6.25 MG tablet Take 3.125 mg by mouth 2 (two) times daily with a meal.   . docusate  sodium (COLACE) 100 MG capsule Take 100 mg by mouth 2 (two) times daily.  . fluticasone (FLONASE) 50 MCG/ACT nasal spray Place 2 sprays into both nostrils 2 (two) times daily as needed for rhinitis.   . furosemide (LASIX) 20 MG tablet Take 1 tablet (20 mg total) by mouth every other day.  Marland Kitchen omeprazole (PRILOSEC) 20 MG capsule TAKE 1 CAPSULE (20 MG TOTAL) BY MOUTH DAILY AS NEEDED (FOR ACID REFLEX).  . pramipexole (MIRAPEX) 0.125 MG tablet TAKE 2 TABLETS BY MOUTH AT NIGHT FOR RESTLESS LEGS (Patient taking differently: Take 0.25 mg by mouth at bedtime. FOR RESTLESS LEGS)  . sertraline (ZOLOFT) 100 MG tablet TAKE 1/2 TABLET DAILY NEEDS OFFICE VISIT (Patient taking differently: Take 50 mg by mouth daily. )  . tamsulosin (FLOMAX) 0.4 MG CAPS capsule Take 0.4 mg by mouth daily.   . traMADol (ULTRAM) 50 MG tablet Take 50 mg by mouth every 6 (six) hours as needed (pain).   . traMADol (ULTRAM) 50 MG tablet TAKE 1 TABLET (50 MG TOTAL) BY MOUTH EVERY 6 (SIX) HOURS AS NEEDED.   No facility-administered encounter medications on file as of 10/23/2018.     PHYSICAL EXAM:   General: NAD, frail appearing, thin Cardiovascular: regular rate and rhythm Pulmonary: clear ant fields Abdomen: soft, nontender, + bowel sounds GU:  no suprapubic tenderness Extremities: no edema, no joint deformities Skin: no rashes Neurological: Weakness but otherwise nonfocal  Stephanie G Martinique, NP

## 2018-10-24 ENCOUNTER — Telehealth: Payer: Self-pay | Admitting: *Deleted

## 2018-10-24 DIAGNOSIS — I480 Paroxysmal atrial fibrillation: Secondary | ICD-10-CM | POA: Diagnosis not present

## 2018-10-24 DIAGNOSIS — F0150 Vascular dementia without behavioral disturbance: Secondary | ICD-10-CM | POA: Diagnosis not present

## 2018-10-24 DIAGNOSIS — I69351 Hemiplegia and hemiparesis following cerebral infarction affecting right dominant side: Secondary | ICD-10-CM | POA: Diagnosis not present

## 2018-10-24 DIAGNOSIS — I69354 Hemiplegia and hemiparesis following cerebral infarction affecting left non-dominant side: Secondary | ICD-10-CM | POA: Diagnosis not present

## 2018-10-24 DIAGNOSIS — N183 Chronic kidney disease, stage 3 (moderate): Secondary | ICD-10-CM | POA: Diagnosis not present

## 2018-10-24 DIAGNOSIS — I69318 Other symptoms and signs involving cognitive functions following cerebral infarction: Secondary | ICD-10-CM | POA: Diagnosis not present

## 2018-10-24 DIAGNOSIS — I130 Hypertensive heart and chronic kidney disease with heart failure and stage 1 through stage 4 chronic kidney disease, or unspecified chronic kidney disease: Secondary | ICD-10-CM | POA: Diagnosis not present

## 2018-10-24 DIAGNOSIS — D631 Anemia in chronic kidney disease: Secondary | ICD-10-CM | POA: Diagnosis not present

## 2018-10-24 DIAGNOSIS — I5043 Acute on chronic combined systolic (congestive) and diastolic (congestive) heart failure: Secondary | ICD-10-CM | POA: Diagnosis not present

## 2018-10-24 NOTE — Telephone Encounter (Signed)
FYI

## 2018-10-24 NOTE — Telephone Encounter (Signed)
Copied from [place] [phone]. Topic: General - Other >> Oct 24, 2018 11:39 AM Keene Breath wrote: Reason for CRM: Elta Guadeloupe, OT with [facility], called to report that patient slipped off the edge of his bed the night before last and fell on the floor.  Patient stated that he could not get up and ended up sleeping on the floor the entire night (10-12 hrs).  Patient stated that he was not in pain and was not hurt.  The medical alert that the patient has was not charged.  Please advise.  CB# [phone]

## 2018-10-29 DIAGNOSIS — I130 Hypertensive heart and chronic kidney disease with heart failure and stage 1 through stage 4 chronic kidney disease, or unspecified chronic kidney disease: Secondary | ICD-10-CM | POA: Diagnosis not present

## 2018-10-29 DIAGNOSIS — I69354 Hemiplegia and hemiparesis following cerebral infarction affecting left non-dominant side: Secondary | ICD-10-CM | POA: Diagnosis not present

## 2018-10-29 DIAGNOSIS — N183 Chronic kidney disease, stage 3 (moderate): Secondary | ICD-10-CM | POA: Diagnosis not present

## 2018-10-29 DIAGNOSIS — I69318 Other symptoms and signs involving cognitive functions following cerebral infarction: Secondary | ICD-10-CM | POA: Diagnosis not present

## 2018-10-29 DIAGNOSIS — F0150 Vascular dementia without behavioral disturbance: Secondary | ICD-10-CM | POA: Diagnosis not present

## 2018-10-29 DIAGNOSIS — I5043 Acute on chronic combined systolic (congestive) and diastolic (congestive) heart failure: Secondary | ICD-10-CM | POA: Diagnosis not present

## 2018-10-29 DIAGNOSIS — I480 Paroxysmal atrial fibrillation: Secondary | ICD-10-CM | POA: Diagnosis not present

## 2018-10-29 DIAGNOSIS — D631 Anemia in chronic kidney disease: Secondary | ICD-10-CM | POA: Diagnosis not present

## 2018-10-29 DIAGNOSIS — I69351 Hemiplegia and hemiparesis following cerebral infarction affecting right dominant side: Secondary | ICD-10-CM | POA: Diagnosis not present

## 2018-10-30 ENCOUNTER — Ambulatory Visit: Payer: Medicare HMO | Admitting: Podiatry

## 2018-10-30 ENCOUNTER — Encounter: Payer: Self-pay | Admitting: Podiatry

## 2018-10-30 VITALS — BP 121/71

## 2018-10-30 DIAGNOSIS — B351 Tinea unguium: Secondary | ICD-10-CM

## 2018-10-30 DIAGNOSIS — M79674 Pain in right toe(s): Secondary | ICD-10-CM

## 2018-10-30 DIAGNOSIS — M79675 Pain in left toe(s): Secondary | ICD-10-CM | POA: Diagnosis not present

## 2018-10-30 NOTE — Patient Instructions (Signed)

## 2018-11-01 DIAGNOSIS — I69351 Hemiplegia and hemiparesis following cerebral infarction affecting right dominant side: Secondary | ICD-10-CM | POA: Diagnosis not present

## 2018-11-01 DIAGNOSIS — I69354 Hemiplegia and hemiparesis following cerebral infarction affecting left non-dominant side: Secondary | ICD-10-CM | POA: Diagnosis not present

## 2018-11-01 DIAGNOSIS — F0150 Vascular dementia without behavioral disturbance: Secondary | ICD-10-CM | POA: Diagnosis not present

## 2018-11-01 DIAGNOSIS — I69318 Other symptoms and signs involving cognitive functions following cerebral infarction: Secondary | ICD-10-CM | POA: Diagnosis not present

## 2018-11-01 DIAGNOSIS — I480 Paroxysmal atrial fibrillation: Secondary | ICD-10-CM | POA: Diagnosis not present

## 2018-11-01 DIAGNOSIS — I5043 Acute on chronic combined systolic (congestive) and diastolic (congestive) heart failure: Secondary | ICD-10-CM | POA: Diagnosis not present

## 2018-11-01 DIAGNOSIS — D631 Anemia in chronic kidney disease: Secondary | ICD-10-CM | POA: Diagnosis not present

## 2018-11-01 DIAGNOSIS — I130 Hypertensive heart and chronic kidney disease with heart failure and stage 1 through stage 4 chronic kidney disease, or unspecified chronic kidney disease: Secondary | ICD-10-CM | POA: Diagnosis not present

## 2018-11-01 DIAGNOSIS — N183 Chronic kidney disease, stage 3 (moderate): Secondary | ICD-10-CM | POA: Diagnosis not present

## 2018-11-05 NOTE — Telephone Encounter (Signed)
Palliative Care SW returned phone call to patient's sister-in-law, Denice Paradise, regarding resources.  Due to patient's financial status he does not appear to qualify for Medicaid.  Encouraged her to contact DSS for possible PCS services.  The family has started the [place] application process.  Patient has a paid caregiver from 1p-5p Monday through Friday and a Life Alert Pendant.

## 2018-11-14 ENCOUNTER — Telehealth: Payer: Self-pay

## 2018-11-14 NOTE — Telephone Encounter (Signed)
Received phone call from patient's daughter, Denice Paradise, requesting visit to evaluate lung congestion. Visit scheduled for 11/15/18

## 2018-11-15 ENCOUNTER — Encounter: Payer: Self-pay | Admitting: Internal Medicine

## 2018-11-15 ENCOUNTER — Other Ambulatory Visit: Payer: Medicare HMO | Admitting: Internal Medicine

## 2018-11-15 DIAGNOSIS — R634 Abnormal weight loss: Secondary | ICD-10-CM

## 2018-11-15 DIAGNOSIS — R531 Weakness: Secondary | ICD-10-CM | POA: Diagnosis not present

## 2018-11-15 NOTE — Progress Notes (Signed)
[facility] Telephone: [phone] Fax: [phone]  PATIENT NAME: Micheal Nicholson DOB: 16-Jun-1920 MRN: [id number]  PRIMARY CARE PROVIDER:   [name], MD  REFERRING PROVIDER:  [name], MD [place], [place] [address]  RESPONSIBLE PARTY:   (dtr-I-L) Mujahid Jalomo [facility], [phone]. (son) Areg Bialas [phone]  Mr. Withem is a 84 yo male with a h/o HTN, CVA with left-sided weakness, GERD, depression, anxiety, BPH, chronic systolic CHF with EF 66%, PPM placement , stage III CKD, and A fib (not on anticoagulants). He was hospitalized about 1 month earlier for treatment of pneumonia. I spoke to patient's daughter via TC. She wishes assessment for signs of pneumonia or CHF as he has been more fatigued as of late. She also requests that I assist with filling out a form for the Department of [place] to assist with provision of home services. Patient is unable to manage his finances. He is forgetful.   ROS: Patient has a fair appetite, consuming 50% of a soft food diet. He has a h/o dysphagia and has had aspiration pneumonia recent past. Additional difficulty with chewing d/t loss of many of his teeth.  His weight is 140 lbs. At a height of 5'10" his BMI is 20.1kg/m2. He has a  clearing cough of clear secretions. Family administering Mucinex qd. Patient has a weak, unsteady shuffling gait, with tendency to slump forward and a lean to one side. He has had falls d/t problems with distance judgement so that he sometimes sits on the edge of the chair or bed and slides off. He ambulates with use of rolator walker or cane. Ambulating distance limited d/t fatigue. He  needs assist with personal hygiene and dressing, because arthritis in his hands compromise fine motor movements. He had actually been out blowing his leaves over this past week. Patient had a skin abrasion when he bumped into some furniture. Area scabbed  over and surrounding mild redness but no warmth. Non-painful. Patient states arthritic type complaints LEs. Daughter is not here at the time of my evaluation, but HHA who watches patient 3x/wk states patient is without change.   RECOMMENDATIONS and PLAN:  1. Weakess: multifactorial d/t advanced age, and underlying cardiac disease,  2. Cough with clear mucus: lungs with poorly localized scattered rhonchi that clear with cough (seems upper airway). He afebrile and denies dyspnea.  -continue Mucinex. Monitor for excerbation.  3. CHF: bilateral LE swelling with soft inspiratory bilateral crackles. However, BP marginally low at 96/56. No significant JVD. -would not tinker with the BP meds at this point. Encourage raising of LEs, no added salt diet, and compression hose.  2. Advanced Care Planning/Goals of care: DNR and MOST form in place. Details of MOST: DNR/DNI, Scope of medical interventions : comfort care. Antibiotics: determine at the time of need. No tube feeding.  3. Dysphagia: High risk for aspiration. Has had ST evaluation during past hospitalization; they recommended regular diet and thin liquids.Helath aid reports no cough during or after meals  4. Anxiety: Well controlled on current regimen.   5. Adult Failure to thrive: Multifactorial. HHA reports appetite improving these last few weeks. Patient went out to [facility] today and enjoyed 50% of a flounder meal.   6. R LW shin abrasion: well healed scab. Area mildly reddened but not warm.  6. Follow up: I left our call back number to be notified should respiratory status decompensate (dyspnea, febrile, change  in color of secretions) or if RLE shin would becomes warm/inflamed. I called D-I-Law Denice Paradise, and left voice message detailing the above.  I spent 25 minutes providing this consultation,  from 4pm to 4:25pm More than 50% of the time in this consultation was spent coordinating communication.    CODE STATUS: DNR  PPS: 50% HOSPICE  ELIGIBILITY/DIAGNOSIS: monitor for decline in functional status or for weight loss  PAST MEDICAL HISTORY:  Past Medical History:  Diagnosis Date  . Anxiety   . Arthritis   . BPH (benign prostatic hyperplasia)   . CHF (congestive heart failure) ([place])   . Chronic combined systolic and diastolic CHF, NYHA class 2 ([place]) 04/17/2015  . GERD (gastroesophageal reflux disease)   . Hypertension   . Stroke ([place])   . Symptomatic sinus bradycardia    PPM Dr. Rayann Heman, 01/13/16, STJ device    SOCIAL HX:  Social History   Tobacco Use  . Smoking status: Former Smoker    Packs/day: 0.50    Years: 5.00    Pack years: 2.50  . Smokeless tobacco: Never Used  Substance Use Topics  . Alcohol use: Yes    Alcohol/week: 1.0 standard drinks    Types: 1 Shots of liquor per week    Comment: occ    ALLERGIES:  Allergies  Allergen Reactions  . Chocolate Other (See Comments)    Acid reflux      PERTINENT MEDICATIONS:  Outpatient Encounter Medications as of 11/15/2018  Medication Sig  . ALPRAZolam (XANAX) 0.5 MG tablet TAKE 1 TABLET BY MOUTH TWICE A DAY AS NEEDED FOR ANXIETY (Patient taking differently: Take 0.5 mg by mouth 2 (two) times daily. FOR ANXIETY)  . carvedilol (COREG) 6.25 MG tablet Take 3.125 mg by mouth 2 (two) times daily with a meal.   . docusate sodium (COLACE) 100 MG capsule Take 100 mg by mouth 2 (two) times daily.  . fluticasone (FLONASE) 50 MCG/ACT nasal spray Place 2 sprays into both nostrils 2 (two) times daily as needed for rhinitis.   . furosemide (LASIX) 20 MG tablet Take 1 tablet (20 mg total) by mouth every other day.  Marland Kitchen omeprazole (PRILOSEC) 20 MG capsule TAKE 1 CAPSULE (20 MG TOTAL) BY MOUTH DAILY AS NEEDED (FOR ACID REFLEX).  . pramipexole (MIRAPEX) 0.125 MG tablet TAKE 2 TABLETS BY MOUTH AT NIGHT FOR RESTLESS LEGS (Patient taking differently: Take 0.25 mg by mouth at bedtime. FOR RESTLESS LEGS)  . sertraline (ZOLOFT) 100 MG tablet TAKE 1/2 TABLET DAILY NEEDS OFFICE VISIT  (Patient taking differently: Take 50 mg by mouth daily. )  . tamsulosin (FLOMAX) 0.4 MG CAPS capsule Take 0.4 mg by mouth daily.   . traMADol (ULTRAM) 50 MG tablet Take 50 mg by mouth every 6 (six) hours as needed (pain).   . traMADol (ULTRAM) 50 MG tablet TAKE 1 TABLET (50 MG TOTAL) BY MOUTH EVERY 6 (SIX) HOURS AS NEEDED.   No facility-administered encounter medications on file as of 11/15/2018.     PHYSICAL EXAM:  VS: BP 96/56. HR 64. RR 16 General: NAD, frail appearing, thin Cardiovascular: regular rate and rhythm without MRG Pulmonary: scattered, poorly localized rhonchi which clear with cough. Soft bilateral bases inspiratory crackles Abdomen: soft, nontender, + bowel sounds GU: no suprapubic tenderness Extremities: Bilateral LE edema to high calves. Softly pitting but not weeping. no joint deformities Skin: no rashes Neurological: Weakness but otherwise nonfocal  Julianne Handler, NP

## 2018-11-16 ENCOUNTER — Encounter: Payer: Self-pay | Admitting: Podiatry

## 2018-11-16 NOTE — Progress Notes (Signed)
Subjective: Micheal Nicholson is a 84 year old male who presents to clinic today for  painful, discolored, thick toenails which interfere with daily activities.  Pain is aggravated when wearing enclosed shoe gear.  Duration greater than 1 month.  He denies any history of foot wounds.  He was last seen in our clinic in 2017.  He has a history of neuropathy as well as restless leg syndrome.  Objective: Vitals:   10/30/18 1100  BP: 121/71   Vascular Examination: Capillary refill time immediate to all 10 digits. Dorsalis pedis nonpalpable bilaterally Posterior tibial pulses nonpalpable bilaterally Digital hair x 10 digits absent bilaterally Skin temperature gradient warm to cool bilaterally  Dermatological Examination: Skin is thin and atrophic bilaterally Toenails 1-5 b/l discolored, thick, dystrophic with subungual debris and pain with palpation to nailbeds due to thickness of nails. No open wounds bilaterally No interdigital macerations bilaterally   Musculoskeletal: Muscle strength 5/5 to all LE muscle groups  Neurological: Sensation intact with 10 gram monofilament. Vibratory sensation intact.  Assessment: Painful onychomycosis toenails 1-5 b/l  Peripheral neuropathy  Plan: 1. Discussed diagnoses today.  Literature dispensed on today's visit. 2. Toenails 1-5 b/l were debrided in length and girth without iatrogenic bleeding. 3. Patient to continue soft, supportive shoe gear 4. Patient to report any pedal injuries to medical professional immediately. 5. Follow up 4 months per patient request. Patient/POA to call should there be a concern in the interim.

## 2018-12-02 ENCOUNTER — Other Ambulatory Visit: Payer: Self-pay | Admitting: Family Medicine

## 2018-12-02 NOTE — Telephone Encounter (Signed)
He is under palliative care.  Refill both meds for 3 months.

## 2018-12-02 NOTE — Telephone Encounter (Signed)
Both medications have been prescribed by different providers.  Please advise.  Last OV 10/01/18, No future OV  Xanax last filled 07/24/18 by Dr. Donne Hazel  Laxix last filled 09/25/18 by Dr. Everlene Farrier D. Hongalgi

## 2018-12-06 ENCOUNTER — Telehealth: Payer: Self-pay

## 2018-12-06 NOTE — Telephone Encounter (Signed)
Please see message.  Copied from [place] [phone]. Topic: General - Inquiry >> Dec 06, 2018  3:00 PM Vernona Rieger wrote: Reason for CRM: Patient's daughter in law, Denice Paradise called and wanted to know if Dr Elease Hashimoto would be willing to write a letter stating that he is terminally ill and why. This is for his [employer] benefits. They advised her that it could take 4 months to a year to get his benefits unless they received this letter from Dr Elease Hashimoto.

## 2018-12-07 ENCOUNTER — Encounter: Payer: Self-pay | Admitting: Family Medicine

## 2018-12-07 NOTE — Telephone Encounter (Signed)
Letter done (see under "letter" tab)

## 2018-12-09 NOTE — Telephone Encounter (Signed)
Called son and left a detailed message to let him know that we have the letter to be picked up at the front desk with the son's name on it and to also add Denice Paradise to the [facility] and her number if it is okay that we talk to her in regards to getting info necessary for the patient.  OK for PEC to discuss if patient calls back.  CRM Created.

## 2018-12-10 ENCOUNTER — Ambulatory Visit (INDEPENDENT_AMBULATORY_CARE_PROVIDER_SITE_OTHER): Payer: Medicare HMO | Admitting: Family Medicine

## 2018-12-10 ENCOUNTER — Other Ambulatory Visit: Payer: Self-pay

## 2018-12-10 ENCOUNTER — Encounter: Payer: Self-pay | Admitting: Family Medicine

## 2018-12-10 VITALS — BP 102/72 | Wt 128.3 lb

## 2018-12-10 DIAGNOSIS — R600 Localized edema: Secondary | ICD-10-CM

## 2018-12-10 DIAGNOSIS — R1031 Right lower quadrant pain: Secondary | ICD-10-CM

## 2018-12-10 DIAGNOSIS — K5900 Constipation, unspecified: Secondary | ICD-10-CM

## 2018-12-10 NOTE — Patient Instructions (Signed)
Constipation, Adult Constipation is when a person has fewer bowel movements in a week than normal, has difficulty having a bowel movement, or has stools that are dry, hard, or larger than normal. Constipation may be caused by an underlying condition. It may become worse with age if a person takes certain medicines and does not take in enough fluids. Follow these instructions at home: Eating and drinking   Eat foods that have a lot of fiber, such as fresh fruits and vegetables, whole grains, and beans.  Limit foods that are high in fat, low in fiber, or overly processed, such as french fries, hamburgers, cookies, candies, and soda.  Drink enough fluid to keep your urine clear or pale yellow. General instructions  Exercise regularly or as told by your health care provider.  Go to the restroom when you have the urge to go. Do not hold it in.  Take over-the-counter and prescription medicines only as told by your health care provider. These include any fiber supplements.  Practice pelvic floor retraining exercises, such as deep breathing while relaxing the lower abdomen and pelvic floor relaxation during bowel movements.  Watch your condition for any changes.  Keep all follow-up visits as told by your health care provider. This is important. Contact a health care provider if:  You have pain that gets worse.  You have a fever.  You do not have a bowel movement after 4 days.  You vomit.  You are not hungry.  You lose weight.  You are bleeding from the anus.  You have thin, pencil-like stools. Get help right away if:  You have a fever and your symptoms suddenly get worse.  You leak stool or have blood in your stool.  Your abdomen is bloated.  You have severe pain in your abdomen.  You feel dizzy or you faint. This information is not intended to replace advice given to you by your health care provider. Make sure you discuss any questions you have with your health care  provider. Document Released: 09/08/2004 Document Revised: 06/30/2016 Document Reviewed: 05/31/2016 Elsevier Interactive Patient Education  2018 [employer].  Drink plenty of fluids  Try to increase fiber intake- may consider Metamucil or Citrucel powder- to mix with liquid  May continue with Miralax as needed  Also may consider glycerin suppository OTC to see if this helps with evacuation of the stool.

## 2018-12-10 NOTE — Progress Notes (Signed)
Subjective:     Patient ID: Micheal Nicholson, male   DOB: [birth year], 84 y.o.   MRN: [id number]  HPI Patient is 84 year old with multiple medical problems who is currently under palliative care.  He is seen with son with complaints of some right lower abdominal pain intermittently for the past couple weeks or so.  No reported fever.  No nausea or vomiting.  He has had general decline with weight loss and decrease in appetite for the past couple years.  He does struggle with bowel movement sometimes and few days ago they gave MiraLAX and he reportedly had a couple bowel movements after that.  Patient has difficulty giving a good history.  He denies any dysuria.  Location of pain is right lower quadrant apparently this is sharp and frequently worse early in the morning.  He had some bilateral leg edema which is chronic.  They have noticed some increased edema from the knee down with some weepy drainage right lower extremity past several days.  Palliative care applied compression wrap.  He takes Lasix every other day low-dose.  Have not noted any increased dyspnea  Past Medical History:  Diagnosis Date  . Anxiety   . Arthritis   . BPH (benign prostatic hyperplasia)   . CHF (congestive heart failure) ([place])   . Chronic combined systolic and diastolic CHF, NYHA class 2 ([place]) 04/17/2015  . GERD (gastroesophageal reflux disease)   . Hypertension   . Stroke ([place])   . Symptomatic sinus bradycardia    PPM Dr. Rayann Heman, 01/13/16, STJ device   Past Surgical History:  Procedure Laterality Date  . CARDIAC CATHETERIZATION  2006   Negative / 4 years ago  . EP IMPLANTABLE DEVICE N/A 01/13/2016   Procedure: Pacemaker Implant;  Surgeon: Thompson Grayer, MD;  Location: [facility];  Service: Cardiovascular;  Laterality: N/A;    reports that he has quit smoking. He has a 2.50 pack-year smoking history. He has never used smokeless tobacco. He reports current alcohol use of about 1.0 standard drinks of  alcohol per week. He reports that he does not use drugs. family history includes Healthy in his son; Heart disease in his brother; Heart failure in his son; Lung cancer in his brother; Pancreatic cancer in his son; Pneumonia (age of onset: 81) in his father. Allergies  Allergen Reactions  . Chocolate Other (See Comments)    Acid reflux      Review of Systems  Constitutional: Negative for chills and fever.  Eyes: Negative for visual disturbance.  Respiratory: Negative for cough, chest tightness and shortness of breath.   Cardiovascular: Positive for leg swelling. Negative for chest pain and palpitations.  Gastrointestinal: Positive for constipation. Negative for nausea and vomiting.  Genitourinary: Negative for dysuria.  Neurological: Positive for weakness. Negative for dizziness, syncope, light-headedness and headaches.       Objective:   Physical Exam Constitutional:      Comments: Somewhat cachectic 84 year old with stooped posture who is alert and cooperative  Cardiovascular:     Rate and Rhythm: Normal rate.     Comments: He has trace to 1+ pitting edema lower legs bilaterally.  He has compression wrap on right lower extremity Pulmonary:     Effort: Pulmonary effort is normal.     Breath sounds: Normal breath sounds.  Abdominal:     General: Abdomen is flat. Bowel sounds are normal. There is no distension.     Palpations: There is no mass.     Tenderness:  There is no abdominal tenderness.  Genitourinary:    Comments: Rectal exam reveals no impaction.  However he has significant stool which is very pasty in the rectal vault       Assessment:     #1 right lower quadrant abdominal pain.  Question related to constipation.  He does not have any fever and nonacute abdomen.  No true impaction  #2 bilateral leg edema likely related to diastolic dysfunction venous stasis predominantly    Plan:     -Increase furosemide to 20 mg daily for the next 3 to 5 days and drop back to  every other day  -Continue with compression lower extremities daily  -Increase fluid intake and try to increase dietary fiber and consider supplement with Citrucel or Metamucil if needed   -Also consider glycerin suppository as needed  -Continue MiraLAX as needed  -Discussed goals of care extensively in the past with family and they are very comfortable with aiming for comfort care.  We would not recommend CT scan or other aggressive intervention given his age and debilitated nature.  May need to consider transition to hospice soon  [name] MD [facility]

## 2018-12-14 LAB — CUP PACEART REMOTE DEVICE CHECK
Battery Remaining Percentage: 95.5 %
Date Time Interrogation Session: 20191011135656
Implantable Lead Implant Date: 20170119
Implantable Pulse Generator Implant Date: 20170119
Lead Channel Impedance Value: 460 Ohm
Lead Channel Pacing Threshold Amplitude: 0.625 V
Lead Channel Sensing Intrinsic Amplitude: 10.1 mV
MDC IDC LEAD LOCATION: 753860
MDC IDC MSMT BATTERY REMAINING LONGEVITY: 138 mo
MDC IDC MSMT BATTERY VOLTAGE: 2.98 V
MDC IDC MSMT LEADCHNL RV PACING THRESHOLD PULSEWIDTH: 0.4 ms
MDC IDC SET LEADCHNL RV PACING AMPLITUDE: 0.875
MDC IDC SET LEADCHNL RV PACING PULSEWIDTH: 0.4 ms
MDC IDC SET LEADCHNL RV SENSING SENSITIVITY: 2 mV
MDC IDC STAT BRADY RV PERCENT PACED: 85 %
Pulse Gen Model: 1240
Pulse Gen Serial Number: 7822433

## 2018-12-20 ENCOUNTER — Other Ambulatory Visit: Payer: Medicare HMO | Admitting: Nurse Practitioner

## 2018-12-20 ENCOUNTER — Encounter: Payer: Self-pay | Admitting: Nurse Practitioner

## 2018-12-20 ENCOUNTER — Telehealth: Payer: Self-pay

## 2018-12-20 NOTE — Telephone Encounter (Signed)
Daughter in law, Makar Slatter called in to office to request follow up visit from NP, Enid Derry.  Denice Paradise reports that patient has had issues with bilateral lower extremity edema for over 6 weeks and has seen NP in the home and PCP.  PCP adjusted fluid medication for several days to improve edema, but edema is consistent and lower extremities are swollen and weeping fluid.  Patient has paid caregivers that wrap lower extremities to absorb fluid and she wants more advise and evaluation from NP.  Denice Paradise also reports that patient continues to have chest congestion and she believes he is declining slowly.  Report and information given to palliative care team to follow up today.

## 2018-12-23 ENCOUNTER — Telehealth: Payer: Self-pay

## 2018-12-23 NOTE — Telephone Encounter (Signed)
At the direction of Palliative Care NP, message sent to PCP to request an order for hospice eval and to verify that he will remain PCP

## 2019-01-02 ENCOUNTER — Ambulatory Visit (INDEPENDENT_AMBULATORY_CARE_PROVIDER_SITE_OTHER): Payer: Medicare Other

## 2019-01-02 DIAGNOSIS — R001 Bradycardia, unspecified: Secondary | ICD-10-CM

## 2019-01-02 DIAGNOSIS — I441 Atrioventricular block, second degree: Secondary | ICD-10-CM

## 2019-01-03 NOTE — Progress Notes (Signed)
Remote pacemaker transmission.   

## 2019-01-04 LAB — CUP PACEART REMOTE DEVICE CHECK
Battery Remaining Percentage: 95.5 %
Implantable Lead Implant Date: 20170119
Implantable Lead Location: 753860
Implantable Pulse Generator Implant Date: 20170119
Lead Channel Impedance Value: 450 Ohm
Lead Channel Pacing Threshold Amplitude: 2.125 V
Lead Channel Pacing Threshold Pulse Width: 0.4 ms
Lead Channel Setting Pacing Pulse Width: 0.4 ms
Lead Channel Setting Sensing Sensitivity: 2 mV
MDC IDC MSMT BATTERY REMAINING LONGEVITY: 115 mo
MDC IDC MSMT BATTERY VOLTAGE: 2.96 V
MDC IDC MSMT LEADCHNL RV SENSING INTR AMPL: 10.1 mV
MDC IDC SESS DTM: 20200109163611
MDC IDC SET LEADCHNL RV PACING AMPLITUDE: 2.375
MDC IDC STAT BRADY RV PERCENT PACED: 84 %
Pulse Gen Serial Number: 7822433

## 2019-01-08 ENCOUNTER — Other Ambulatory Visit: Payer: Self-pay | Admitting: Family Medicine

## 2019-01-22 ENCOUNTER — Telehealth: Payer: Self-pay | Admitting: *Deleted

## 2019-01-22 NOTE — Telephone Encounter (Signed)
Called [place] and left a voice message to let her know that we have not seen this paperwork and to please refax to [place] so we can complete and return.

## 2019-01-22 NOTE — Telephone Encounter (Signed)
No  We do not have anything pending in our work folder for him.

## 2019-01-22 NOTE — Telephone Encounter (Signed)
Copied from [place]. Topic: General - Other >> Jan 22, 2019  9:46 AM Virl Axe D wrote: Reason for CRM: Geradine Girt with [facility] called to follow up on supplemental plan of care orders and CTI faxed over on 01/17/19, originally faxed in December. Please advise [phone]

## 2019-01-22 NOTE — Telephone Encounter (Signed)
Do you remember filling this out?

## 2019-01-23 NOTE — Telephone Encounter (Signed)
Micheal Nicholson 01/23/2019 12:51 PM  Summary: Micheal Nicholson called back today in regards to yesterday's message

## 2019-01-24 NOTE — Telephone Encounter (Signed)
Is this in your red folder from today?

## 2019-01-24 NOTE — Telephone Encounter (Signed)
NO.  I do not have any forms for him in my envelope.

## 2019-01-27 NOTE — Telephone Encounter (Signed)
Shavon from Hospice calling about this again.  States she will refax form - states she needs to speak with someone at the office about this.

## 2019-01-27 NOTE — Telephone Encounter (Signed)
Called [place] and left a voice message to let her know that we will continue to look for this fax and left the fax number of [phone]. I will call her once we receive it.

## 2019-01-28 ENCOUNTER — Ambulatory Visit: Payer: Self-pay

## 2019-01-28 MED ORDER — OXYCODONE-ACETAMINOPHEN 2.5-325 MG PO TABS: 1.0000 | ORAL_TABLET | Freq: Four times a day (QID) | ORAL | 0 refills | Status: AC | PRN

## 2019-01-28 NOTE — Telephone Encounter (Signed)
We have received the paperwork and placed in red folder on Dr. Erick Blinks desk. Please return when complete so I can call [place]. Thank you!

## 2019-01-28 NOTE — Telephone Encounter (Signed)
Called Sam with Hospice and let him know the Percocet has been sent to the pharmacy for the patient and gave him the message from Dr. Elease Hashimoto.  Sam verbalized an understanding and is going to contact the family.

## 2019-01-28 NOTE — Telephone Encounter (Signed)
Paperwork has been faxed and confirmation fax has been received.

## 2019-01-28 NOTE — Telephone Encounter (Signed)
Micheal Nicholson with [facility] calling to report pt.'s pain is 10/10 - right shoulder,arm,hand and hip. Reports pt. Has fallen x 4 in the home and family will be having 24 hour care for him in the home now.Pt. receiving a hospital bed and O2 in the home today. O2 sat 85%. Tramadol is not helping . Request Percocet - reports he has had this in the past and had good results. Uses [facility] [address]. Please call Micheal Nicholson when prescription is sent - [phone].

## 2019-01-28 NOTE — Telephone Encounter (Signed)
done

## 2019-01-28 NOTE — Telephone Encounter (Signed)
Please see message. °

## 2019-01-28 NOTE — Telephone Encounter (Signed)
Prescription sent for Percocet.  Noted hospice note that his pain is poorly controlled with tramadol.  Have him discontinue tramadol and will start Percocet 1 every 6-8 hours as needed for pain #90 with no refill

## 2019-01-29 ENCOUNTER — Telehealth: Payer: Self-pay

## 2019-02-23 NOTE — Telephone Encounter (Signed)
noted 

## 2019-02-23 NOTE — Telephone Encounter (Signed)
FYI  Copied from [place] [phone]. Topic: General - Deceased Patient >> 02-21-19  9:12 AM Keene Breath wrote: Reason for CRM: Patient has passed away on 02-21-19  Route to department's PEC Pool.

## 2019-02-23 DEATH — deceased

## 2019-02-26 ENCOUNTER — Ambulatory Visit: Payer: Medicare HMO | Admitting: Podiatry
